# Patient Record
Sex: Female | Born: 1985 | Hispanic: Yes | Marital: Married | Smoking: Never smoker
Health system: Southern US, Community
[De-identification: ages and names within clinical notes are randomized; demographics above are authoritative.]

## PROBLEM LIST (undated history)

## (undated) ENCOUNTER — Inpatient Hospital Stay (HOSPITAL_COMMUNITY): Payer: Self-pay

## (undated) DIAGNOSIS — R51 Headache: Secondary | ICD-10-CM

## (undated) DIAGNOSIS — T7840XA Allergy, unspecified, initial encounter: Secondary | ICD-10-CM

## (undated) DIAGNOSIS — K219 Gastro-esophageal reflux disease without esophagitis: Secondary | ICD-10-CM

## (undated) DIAGNOSIS — G039 Meningitis, unspecified: Secondary | ICD-10-CM

## (undated) DIAGNOSIS — K529 Noninfective gastroenteritis and colitis, unspecified: Secondary | ICD-10-CM

## (undated) DIAGNOSIS — G8929 Other chronic pain: Secondary | ICD-10-CM

## (undated) DIAGNOSIS — R519 Headache, unspecified: Secondary | ICD-10-CM

## (undated) DIAGNOSIS — B999 Unspecified infectious disease: Secondary | ICD-10-CM

## (undated) DIAGNOSIS — F419 Anxiety disorder, unspecified: Secondary | ICD-10-CM

## (undated) DIAGNOSIS — N83209 Unspecified ovarian cyst, unspecified side: Secondary | ICD-10-CM

## (undated) DIAGNOSIS — K5909 Other constipation: Secondary | ICD-10-CM

## (undated) HISTORY — DX: Other constipation: K59.09

## (undated) HISTORY — DX: Gastro-esophageal reflux disease without esophagitis: K21.9

## (undated) HISTORY — PX: COLONOSCOPY: SHX174

## (undated) HISTORY — PX: WISDOM TOOTH EXTRACTION: SHX21

## (undated) HISTORY — DX: Allergy, unspecified, initial encounter: T78.40XA

## (undated) HISTORY — DX: Noninfective gastroenteritis and colitis, unspecified: K52.9

## (undated) SURGERY — Surgical Case
Anesthesia: *Unknown

---
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
## 2006-02-09 ENCOUNTER — Emergency Department (HOSPITAL_COMMUNITY): Admission: EM | Admit: 2006-02-09 | Discharge: 2006-02-09 | Payer: Self-pay | Admitting: Family Medicine

## 2006-06-07 ENCOUNTER — Emergency Department (HOSPITAL_COMMUNITY): Admission: EM | Admit: 2006-06-07 | Discharge: 2006-06-07 | Payer: Self-pay | Admitting: Emergency Medicine

## 2007-04-25 ENCOUNTER — Emergency Department (HOSPITAL_COMMUNITY): Admission: EM | Admit: 2007-04-25 | Discharge: 2007-04-25 | Payer: Self-pay | Admitting: *Deleted

## 2007-11-24 ENCOUNTER — Encounter (INDEPENDENT_AMBULATORY_CARE_PROVIDER_SITE_OTHER): Payer: Self-pay | Admitting: Internal Medicine

## 2007-11-30 ENCOUNTER — Encounter (INDEPENDENT_AMBULATORY_CARE_PROVIDER_SITE_OTHER): Payer: Self-pay | Admitting: Internal Medicine

## 2007-11-30 ENCOUNTER — Ambulatory Visit: Payer: Self-pay | Admitting: Internal Medicine

## 2007-11-30 DIAGNOSIS — R413 Other amnesia: Secondary | ICD-10-CM | POA: Insufficient documentation

## 2007-11-30 DIAGNOSIS — R51 Headache: Secondary | ICD-10-CM

## 2007-11-30 DIAGNOSIS — R519 Headache, unspecified: Secondary | ICD-10-CM | POA: Insufficient documentation

## 2007-11-30 HISTORY — DX: Other amnesia: R41.3

## 2007-11-30 HISTORY — DX: Headache: R51

## 2007-12-04 ENCOUNTER — Ambulatory Visit: Payer: Self-pay | Admitting: Internal Medicine

## 2007-12-04 ENCOUNTER — Encounter (INDEPENDENT_AMBULATORY_CARE_PROVIDER_SITE_OTHER): Payer: Self-pay | Admitting: Internal Medicine

## 2007-12-04 LAB — CONVERTED CEMR LAB
AST: 16 units/L (ref 0–37)
Alkaline Phosphatase: 48 units/L (ref 39–117)
CO2: 28 meq/L (ref 19–32)
Chloride: 108 meq/L (ref 96–112)
Creatinine, Ser: 0.57 mg/dL (ref 0.40–1.20)
HCT: 38 % (ref 36.0–46.0)
Hemoglobin: 12.9 g/dL (ref 12.0–15.0)
INR: 1.1 (ref 0.0–1.5)
Lymphs Abs: 2.8 10*3/uL (ref 0.7–4.0)
Monocytes Absolute: 0.3 10*3/uL (ref 0.1–1.0)
Monocytes Relative: 4 % (ref 3–12)
Neutrophils Relative %: 57 % (ref 43–77)
Potassium: 4.1 meq/L (ref 3.5–5.3)
RBC: 4.44 M/uL (ref 3.87–5.11)
RDW: 12.5 % (ref 11.5–15.5)
Total Bilirubin: 0.6 mg/dL (ref 0.3–1.2)
Total Protein: 6.1 g/dL (ref 6.0–8.3)
Vitamin B-12: 561 pg/mL (ref 211–911)

## 2007-12-18 ENCOUNTER — Ambulatory Visit (HOSPITAL_COMMUNITY): Admission: RE | Admit: 2007-12-18 | Discharge: 2007-12-18 | Payer: Self-pay | Admitting: Internal Medicine

## 2008-03-30 ENCOUNTER — Encounter (INDEPENDENT_AMBULATORY_CARE_PROVIDER_SITE_OTHER): Payer: Self-pay | Admitting: Internal Medicine

## 2008-03-30 ENCOUNTER — Ambulatory Visit: Payer: Self-pay | Admitting: Infectious Diseases

## 2008-08-07 ENCOUNTER — Emergency Department (HOSPITAL_COMMUNITY): Admission: EM | Admit: 2008-08-07 | Discharge: 2008-08-07 | Payer: Self-pay | Admitting: Family Medicine

## 2008-08-28 ENCOUNTER — Emergency Department (HOSPITAL_COMMUNITY): Admission: EM | Admit: 2008-08-28 | Discharge: 2008-08-28 | Payer: Self-pay | Admitting: *Deleted

## 2008-09-07 ENCOUNTER — Emergency Department (HOSPITAL_COMMUNITY): Admission: EM | Admit: 2008-09-07 | Discharge: 2008-09-07 | Payer: Self-pay | Admitting: Emergency Medicine

## 2009-01-29 ENCOUNTER — Emergency Department (HOSPITAL_COMMUNITY): Admission: EM | Admit: 2009-01-29 | Discharge: 2009-01-29 | Payer: Self-pay | Admitting: Emergency Medicine

## 2009-03-23 ENCOUNTER — Emergency Department (HOSPITAL_COMMUNITY): Admission: EM | Admit: 2009-03-23 | Discharge: 2009-03-23 | Payer: Self-pay | Admitting: Emergency Medicine

## 2009-05-04 ENCOUNTER — Emergency Department (HOSPITAL_COMMUNITY): Admission: EM | Admit: 2009-05-04 | Discharge: 2009-05-04 | Payer: Self-pay | Admitting: Family Medicine

## 2009-09-05 ENCOUNTER — Emergency Department (HOSPITAL_COMMUNITY): Admission: EM | Admit: 2009-09-05 | Discharge: 2009-09-05 | Payer: Self-pay | Admitting: Family Medicine

## 2010-02-05 ENCOUNTER — Emergency Department (HOSPITAL_COMMUNITY): Admission: EM | Admit: 2010-02-05 | Discharge: 2010-02-05 | Payer: Self-pay | Admitting: Emergency Medicine

## 2010-03-12 ENCOUNTER — Ambulatory Visit: Payer: Self-pay | Admitting: Nurse Practitioner

## 2010-03-12 ENCOUNTER — Inpatient Hospital Stay (HOSPITAL_COMMUNITY): Admission: AD | Admit: 2010-03-12 | Discharge: 2010-03-12 | Payer: Self-pay | Admitting: Obstetrics and Gynecology

## 2010-03-28 ENCOUNTER — Inpatient Hospital Stay (HOSPITAL_COMMUNITY): Admission: AD | Admit: 2010-03-28 | Discharge: 2010-03-28 | Payer: Self-pay | Admitting: Obstetrics and Gynecology

## 2010-04-11 ENCOUNTER — Ambulatory Visit (HOSPITAL_COMMUNITY): Admission: RE | Admit: 2010-04-11 | Discharge: 2010-04-11 | Payer: Self-pay | Admitting: Family Medicine

## 2010-04-25 ENCOUNTER — Inpatient Hospital Stay (HOSPITAL_COMMUNITY)
Admission: AD | Admit: 2010-04-25 | Discharge: 2010-04-25 | Payer: Self-pay | Source: Home / Self Care | Attending: Obstetrics & Gynecology | Admitting: Obstetrics & Gynecology

## 2010-04-27 ENCOUNTER — Ambulatory Visit (HOSPITAL_COMMUNITY)
Admission: RE | Admit: 2010-04-27 | Discharge: 2010-04-27 | Payer: Self-pay | Source: Home / Self Care | Attending: Obstetrics & Gynecology | Admitting: Obstetrics & Gynecology

## 2010-05-09 ENCOUNTER — Inpatient Hospital Stay (HOSPITAL_COMMUNITY)
Admission: RE | Admit: 2010-05-09 | Discharge: 2010-05-09 | Payer: Self-pay | Source: Home / Self Care | Attending: Family Medicine | Admitting: Family Medicine

## 2010-05-10 ENCOUNTER — Inpatient Hospital Stay (HOSPITAL_COMMUNITY)
Admission: AD | Admit: 2010-05-10 | Discharge: 2010-05-10 | Payer: Self-pay | Source: Home / Self Care | Attending: Family Medicine | Admitting: Family Medicine

## 2010-05-13 NOTE — L&D Delivery Note (Signed)
Delivery Note At 6:49 PM a viable female was delivered via Vaginal, Spontaneous Delivery (Presentation: Vtx, OA;  ).  APGAR: 9, 9; weight 6lb 1oz.   Placenta status: Intact-->Pathology, .  Cord: 3 vessels.  Delivered through loose nuchal cord x 1.  Anesthesia: Epidural  Episiotomy: None Lacerations:  B/L labial tears  Suture Repair: 3.0 vicryl rapide Est. Blood Loss (mL):   Mom to postpartum.  Baby to nursery-stable.  JACKSON-MOORE,Kabrina Christiano A 12/30/2010, 7:02 PM

## 2010-05-15 LAB — HEPATITIS B SURFACE ANTIGEN: Hepatitis B Surface Ag: NEGATIVE

## 2010-06-30 ENCOUNTER — Inpatient Hospital Stay (HOSPITAL_COMMUNITY)
Admission: AD | Admit: 2010-06-30 | Discharge: 2010-06-30 | Disposition: A | Discharge status: Home / Self Care | Payer: Medicaid Other | Source: Ambulatory Visit | Attending: Obstetrics | Admitting: Obstetrics

## 2010-06-30 DIAGNOSIS — R109 Unspecified abdominal pain: Secondary | ICD-10-CM

## 2010-06-30 DIAGNOSIS — O99891 Other specified diseases and conditions complicating pregnancy: Secondary | ICD-10-CM | POA: Insufficient documentation

## 2010-06-30 DIAGNOSIS — O9989 Other specified diseases and conditions complicating pregnancy, childbirth and the puerperium: Secondary | ICD-10-CM

## 2010-06-30 DIAGNOSIS — K219 Gastro-esophageal reflux disease without esophagitis: Secondary | ICD-10-CM | POA: Insufficient documentation

## 2010-07-06 ENCOUNTER — Inpatient Hospital Stay (HOSPITAL_COMMUNITY)
Admission: AD | Admit: 2010-07-06 | Discharge: 2010-07-06 | Disposition: A | Discharge status: Home / Self Care | Payer: Medicaid Other | Source: Ambulatory Visit | Attending: Obstetrics & Gynecology | Admitting: Obstetrics & Gynecology

## 2010-07-06 DIAGNOSIS — R109 Unspecified abdominal pain: Secondary | ICD-10-CM | POA: Insufficient documentation

## 2010-07-06 DIAGNOSIS — K219 Gastro-esophageal reflux disease without esophagitis: Secondary | ICD-10-CM | POA: Insufficient documentation

## 2010-07-06 DIAGNOSIS — O9989 Other specified diseases and conditions complicating pregnancy, childbirth and the puerperium: Secondary | ICD-10-CM

## 2010-07-06 DIAGNOSIS — O99891 Other specified diseases and conditions complicating pregnancy: Secondary | ICD-10-CM | POA: Insufficient documentation

## 2010-07-06 LAB — CBC
Hemoglobin: 11.3 g/dL — ABNORMAL LOW (ref 12.0–15.0)
MCH: 29.2 pg (ref 26.0–34.0)
MCHC: 33.6 g/dL (ref 30.0–36.0)
MCV: 86.8 fL (ref 78.0–100.0)
Platelets: 279 10*3/uL (ref 150–400)
RDW: 12.7 % (ref 11.5–15.5)

## 2010-07-06 LAB — COMPREHENSIVE METABOLIC PANEL
Albumin: 3.4 g/dL — ABNORMAL LOW (ref 3.5–5.2)
BUN: 5 mg/dL — ABNORMAL LOW (ref 6–23)
CO2: 20 mEq/L (ref 19–32)
GFR calc Af Amer: >60 mL/min (ref >60)
GFR calc non Af Amer: >60 mL/min (ref >60)
Potassium: 3.5 mEq/L (ref 3.5–5.1)
Sodium: 133 mEq/L — ABNORMAL LOW (ref 135–145)
Total Bilirubin: 0.4 mg/dL (ref 0.3–1.2)

## 2010-07-06 LAB — LIPASE, BLOOD: Lipase: 33 U/L (ref 11–59)

## 2010-07-09 LAB — H. PYLORI ANTIBODY, IGG: H Pylori IgG: <0.4 {ISR}

## 2010-07-23 LAB — HCG, QUANTITATIVE, PREGNANCY: hCG, Beta Chain, Quant, S: 221 m[IU]/mL — ABNORMAL HIGH (ref <5)

## 2010-07-23 LAB — GC/CHLAMYDIA PROBE AMP, GENITAL
Chlamydia, DNA Probe: NEGATIVE
GC Probe Amp, Genital: NEGATIVE

## 2010-07-23 LAB — WET PREP, GENITAL: Trich, Wet Prep: NONE SEEN

## 2010-07-24 LAB — HERPES SIMPLEX VIRUS CULTURE

## 2010-07-24 LAB — CBC
Hemoglobin: 13.1 g/dL (ref 12.0–15.0)
MCH: 29.7 pg (ref 26.0–34.0)
Platelets: 326 10*3/uL (ref 150–400)
WBC: 9.1 10*3/uL (ref 4.0–10.5)

## 2010-07-24 LAB — GC/CHLAMYDIA PROBE AMP, GENITAL
Chlamydia, DNA Probe: NEGATIVE
GC Probe Amp, Genital: NEGATIVE

## 2010-07-24 LAB — URINALYSIS, ROUTINE W REFLEX MICROSCOPIC
Glucose, UA: NEGATIVE mg/dL
Leukocytes, UA: NEGATIVE
Nitrite: NEGATIVE
Protein, ur: NEGATIVE mg/dL
Urobilinogen, UA: 0.2 mg/dL (ref 0.0–1.0)

## 2010-07-24 LAB — URINE MICROSCOPIC-ADD ON

## 2010-07-24 LAB — POCT PREGNANCY, URINE: Preg Test, Ur: NEGATIVE

## 2010-07-24 LAB — WET PREP, GENITAL: Trich, Wet Prep: NONE SEEN

## 2010-07-24 LAB — HCG, QUANTITATIVE, PREGNANCY: hCG, Beta Chain, Quant, S: 5 m[IU]/mL — ABNORMAL HIGH (ref <5)

## 2010-07-25 LAB — URINALYSIS, ROUTINE W REFLEX MICROSCOPIC
Glucose, UA: NEGATIVE mg/dL
Ketones, ur: NEGATIVE mg/dL
Leukocytes, UA: NEGATIVE
Nitrite: NEGATIVE
Urobilinogen, UA: 0.2 mg/dL (ref 0.0–1.0)

## 2010-07-25 LAB — CBC
HCT: 38.9 % (ref 36.0–46.0)
Hemoglobin: 13.1 g/dL (ref 12.0–15.0)
RBC: 4.38 MIL/uL (ref 3.87–5.11)
WBC: 11.1 10*3/uL — ABNORMAL HIGH (ref 4.0–10.5)

## 2010-07-25 LAB — URINE MICROSCOPIC-ADD ON

## 2010-07-25 LAB — POCT PREGNANCY, URINE: Preg Test, Ur: NEGATIVE

## 2010-08-13 LAB — POCT RAPID STREP A (OFFICE): Streptococcus, Group A Screen (Direct): NEGATIVE

## 2010-08-15 LAB — CBC
HCT: 39.9 % (ref 36.0–46.0)
Hemoglobin: 13.9 g/dL (ref 12.0–15.0)
MCV: 85.8 fL (ref 78.0–100.0)
RBC: 4.65 MIL/uL (ref 3.87–5.11)
WBC: 18.4 10*3/uL — ABNORMAL HIGH (ref 4.0–10.5)

## 2010-08-15 LAB — DIFFERENTIAL
Basophils Absolute: 0.1 10*3/uL (ref 0.0–0.1)
Basophils Relative: 0 % (ref 0–1)
Eosinophils Relative: 1 % (ref 0–5)
Lymphocytes Relative: 11 % — ABNORMAL LOW (ref 12–46)
Monocytes Absolute: 0.6 10*3/uL (ref 0.1–1.0)
Neutro Abs: 15.5 10*3/uL — ABNORMAL HIGH (ref 1.7–7.7)

## 2010-08-15 LAB — COMPREHENSIVE METABOLIC PANEL
Alkaline Phosphatase: 47 U/L (ref 39–117)
BUN: 7 mg/dL (ref 6–23)
CO2: 22 mEq/L (ref 19–32)
Chloride: 110 mEq/L (ref 96–112)
Creatinine, Ser: 0.6 mg/dL (ref 0.4–1.2)
GFR calc non Af Amer: >60 mL/min (ref >60)
Potassium: 3.8 mEq/L (ref 3.5–5.1)
Total Bilirubin: 0.4 mg/dL (ref 0.3–1.2)

## 2010-08-22 LAB — POCT RAPID STREP A (OFFICE): Streptococcus, Group A Screen (Direct): NEGATIVE

## 2010-08-27 ENCOUNTER — Other Ambulatory Visit: Payer: Self-pay | Admitting: Obstetrics & Gynecology

## 2010-08-27 DIAGNOSIS — Z0489 Encounter for examination and observation for other specified reasons: Secondary | ICD-10-CM

## 2010-08-27 DIAGNOSIS — IMO0002 Reserved for concepts with insufficient information to code with codable children: Secondary | ICD-10-CM

## 2010-08-27 DIAGNOSIS — O2690 Pregnancy related conditions, unspecified, unspecified trimester: Secondary | ICD-10-CM

## 2010-08-29 ENCOUNTER — Ambulatory Visit (HOSPITAL_COMMUNITY)
Admission: RE | Admit: 2010-08-29 | Discharge: 2010-08-29 | Disposition: A | Discharge status: Home / Self Care | Payer: Medicaid Other | Source: Ambulatory Visit | Attending: Obstetrics & Gynecology | Admitting: Obstetrics & Gynecology

## 2010-08-29 DIAGNOSIS — O2690 Pregnancy related conditions, unspecified, unspecified trimester: Secondary | ICD-10-CM

## 2010-08-29 DIAGNOSIS — Z0489 Encounter for examination and observation for other specified reasons: Secondary | ICD-10-CM

## 2010-08-29 DIAGNOSIS — Z363 Encounter for antenatal screening for malformations: Secondary | ICD-10-CM | POA: Insufficient documentation

## 2010-08-29 DIAGNOSIS — Z1389 Encounter for screening for other disorder: Secondary | ICD-10-CM | POA: Insufficient documentation

## 2010-08-29 DIAGNOSIS — O358XX0 Maternal care for other (suspected) fetal abnormality and damage, not applicable or unspecified: Secondary | ICD-10-CM | POA: Insufficient documentation

## 2010-09-05 ENCOUNTER — Inpatient Hospital Stay (HOSPITAL_COMMUNITY)
Admission: AD | Admit: 2010-09-05 | Discharge: 2010-09-05 | Disposition: A | Discharge status: Home / Self Care | Payer: Medicaid Other | Source: Ambulatory Visit | Attending: Obstetrics & Gynecology | Admitting: Obstetrics & Gynecology

## 2010-09-05 DIAGNOSIS — O210 Mild hyperemesis gravidarum: Secondary | ICD-10-CM | POA: Insufficient documentation

## 2010-09-05 LAB — COMPREHENSIVE METABOLIC PANEL
BUN: 5 mg/dL — ABNORMAL LOW (ref 6–23)
CO2: 23 mEq/L (ref 19–32)
Calcium: 9.2 mg/dL (ref 8.4–10.5)
Creatinine, Ser: 0.4 mg/dL (ref 0.4–1.2)
GFR calc non Af Amer: >60 mL/min (ref >60)
Glucose, Bld: 87 mg/dL (ref 70–99)
Sodium: 134 mEq/L — ABNORMAL LOW (ref 135–145)
Total Protein: 6 g/dL (ref 6.0–8.3)

## 2010-09-05 LAB — URINALYSIS, ROUTINE W REFLEX MICROSCOPIC
Bilirubin Urine: NEGATIVE
Hgb urine dipstick: NEGATIVE
Nitrite: NEGATIVE
Protein, ur: NEGATIVE mg/dL
Specific Gravity, Urine: 1.02 (ref 1.005–1.030)
Urobilinogen, UA: 0.2 mg/dL (ref 0.0–1.0)

## 2010-09-19 ENCOUNTER — Other Ambulatory Visit: Payer: Self-pay | Admitting: Obstetrics

## 2010-09-19 DIAGNOSIS — O358XX0 Maternal care for other (suspected) fetal abnormality and damage, not applicable or unspecified: Secondary | ICD-10-CM

## 2010-09-24 ENCOUNTER — Inpatient Hospital Stay (HOSPITAL_COMMUNITY)
Admission: AD | Admit: 2010-09-24 | Discharge: 2010-09-24 | Disposition: A | Discharge status: Home / Self Care | Payer: Medicaid Other | Source: Ambulatory Visit | Attending: Obstetrics | Admitting: Obstetrics

## 2010-09-24 DIAGNOSIS — N760 Acute vaginitis: Secondary | ICD-10-CM | POA: Insufficient documentation

## 2010-09-24 DIAGNOSIS — A499 Bacterial infection, unspecified: Secondary | ICD-10-CM | POA: Insufficient documentation

## 2010-09-24 DIAGNOSIS — R109 Unspecified abdominal pain: Secondary | ICD-10-CM

## 2010-09-24 DIAGNOSIS — O2390 Unspecified genitourinary tract infection in pregnancy, unspecified trimester: Secondary | ICD-10-CM | POA: Insufficient documentation

## 2010-09-24 DIAGNOSIS — B9689 Other specified bacterial agents as the cause of diseases classified elsewhere: Secondary | ICD-10-CM | POA: Insufficient documentation

## 2010-09-24 LAB — URINALYSIS, ROUTINE W REFLEX MICROSCOPIC
Glucose, UA: NEGATIVE mg/dL
Ketones, ur: NEGATIVE mg/dL
Nitrite: NEGATIVE
Specific Gravity, Urine: <1.005 — ABNORMAL LOW (ref 1.005–1.030)
pH: 7 (ref 5.0–8.0)

## 2010-09-24 LAB — URINE MICROSCOPIC-ADD ON

## 2010-09-24 LAB — FETAL FIBRONECTIN: Fetal Fibronectin: NEGATIVE

## 2010-09-24 LAB — WET PREP, GENITAL
Trich, Wet Prep: NONE SEEN
Yeast Wet Prep HPF POC: NONE SEEN

## 2010-09-25 LAB — URINE CULTURE
Culture  Setup Time: 201205150212
Culture: NO GROWTH

## 2010-09-28 NOTE — Discharge Summary (Signed)
NAMEJADAE, Lydia [name]                 ACCOUNT NO.:  [id number]   MEDICAL RECORD NO.:  [id number]          PATIENT TYPE:  INP   LOCATION:  3711                         FACILITY:  [facility]   PHYSICIAN:  Fransisco Hertz, M.D.  DATE OF BIRTH:  Jan 30, 1986   DATE OF ADMISSION:  02/28/2005  DATE OF DISCHARGE:  03/02/2005                                 DISCHARGE SUMMARY   DISCHARGE DIAGNOSES:  1.  Aseptic meningitis.  2.  Status post delivery of first child in July 2006.   DISCHARGE MEDICATIONS:  1.  Acyclovir 400 mg one tablet t.i.d. p.o. for five days.  2.  Percocet 5/325 mg one to two tablets p.o. q.4h. p.r.n.   DISPOSITION AT DISCHARGE:  The patient was stable at discharge.  Her  headache had improved.  She did not have nuchal rigidity and had no  complaints.  She was discharged with a five-day course of acyclovir 400 mg  empirically until the HSV PCR results are final.  A CD4 count and  ultraquantitative viral load are still pending and should be followed in the  next two weeks as an outpatient.   PROCEDURES:  She had an MRI and MRA of the head with and without contrast,  which showed subtle increased signal and diffuse leptomeningeal enhancement,  which are compatible with meningitis, as well as no definite venous  occlusive disease.  A lumbar puncture was done on October 19.  The fluid was  colorless, clear.  In the first tube there were 478 red blood cells per cu  mm, 188 white blood cells per cu mm, 1% neutrophils, lymphocytes 95%.  In  tube 4 there were 10 red blood cells per cu mm, 200 white blood cells per cu  mm, with 98% lymphocytes, 2% monocytes.  Glucose was 58 mg/dl, protein 88  mg/dl, and there was no organism seen on Gram stain.   Consultations were done with Bevelyn Buckles. Champey, M.D., neurologist.   ADMITTING HISTORY AND PHYSICAL:  Lydia [name] is an 25 year old woman who was  previously in good health with a past medical history only significant for  recent term delivery  in July 2006 of a healthy baby.  The past two weeks she  reports having bifrontal headache that is more or less continuous with no  clear relieving factors.  The headache was aggravated by everything, per the  patient.  She had associated nausea, no vomiting, no alteration of the level  of consciousness, no vision changes.  She did report one episode of fever  with a temperature of 102 degrees with chills.  She denies weakness of  extremities.  Denies any trauma, and this is the first episode of this kind  of headache.   REVIEW OF SYSTEMS:  Otherwise negative.   PHYSICAL EXAMINATION:  VITAL SIGNS:  She had a temperature of 98.2, blood  pressure of 112/76, pulse of 82, respiratory rate 22.  GENERAL:  She had a good general appearance but was in mild discomfort.  HEENT:  The pupils were equal and reactive to light.  She was photophobic.  ENT  exam was grossly normal.  NECK:  She did not have neck stiffness, but her neck was tender to flexion.  CHEST:  Lungs were clear to auscultation bilaterally.  CARDIAC:  Heart exam was normal.  ABDOMEN:  Normal.  EXTREMITIES:  She did not have lower extremity edema.  SKIN:  Warm and dry.  LYMPHATIC:  She did not have lymphadenopathy.  NEUROLOGIC:  The cranial nerves were normal from II-XII.  She was oriented  x3.  Gait was normal.  Sensory and motor exams were normal and symmetrical  bilaterally.   LABORATORY DATA:  Sodium 136, potassium 3.7, chloride 103, CO2 25, BUN 9,  creatinine 0.6, glucose 123.  White blood count 18.6 with an ANC of 88%,  hemoglobin 14.3, MCV 85, platelets 457.  CT scan of the head done on October  15 showed no intracranial abnormality and chronic sinusitis.  A lumbar  puncture was done and showed glucose of 58 and protein of 88 mg/dl.  Neutrophils of 1-2% and lymphocytes of 95-98%.  No organisms were seen on  Gram stain.   HOSPITAL COURSE:  Lydia [name] was admitted for supportive treatment for what  was likely an  aseptic/viral meningitis with suspicion for HSV meningitis.  She was started empirically on IV acyclovir.  CSF was sent for HSV PCR.  We  also drew some blood for HIV testing.  She stated she had HIV testing during  her pregnancy which was negative, but we sent the blood for CD4 count and  ultraquantitative viral load for HIV.  Given her history of recent delivery,  we had an MRI/MRA to rule out intracranial thrombosis.  The day after  admission we stopped the acyclovir and she had no confusion or evidence of  encephalopathy.  This to be restarted if the CSF is positive for HSV.  The  MRI/MRA turned out to be negative for thrombus.  On October 21 her headache  had improved.  She did not have any nuchal rigidity or confusion.  Vital  signs:  Temperature was 98.5, pulse 88, respiratory rate 20, blood pressure  110/64.  She was saturating at 99% on room air.  She had a white blood count  of 8.8, hemoglobin of 12.9, platelets 907.  Sodium 135, potassium 3.7,  chloride 100, CO2 29, BUN 6, creatinine 0.7, glucose 86, calcium 9.5.  She  was discharged home with empirical therapy with acyclovir 400 mg for five  days until the HSV PCR results were final following Dr. Nash Shearer,  neurologist's, recommendations.   PENDING LABS:  HSV PCR, CD4 count and HIV viral load were still pending.  This should be followed as an outpatient in one to two weeks.      Dellia Beckwith, M.D.    ______________________________  Fransisco Hertz, M.D.    VD/MEDQ  D:  03/12/2005  T:  03/13/2005  Job:  [id number]   cc:   [facility]   [facility]

## 2010-09-28 NOTE — Consult Note (Signed)
NAMEDELAYNEE, ALRED                 ACCOUNT NO.:  [id number]   MEDICAL RECORD NO.:  [id number]          PATIENT TYPE:  WOC   LOCATION:  WOC                          FACILITY:  [facility]   PHYSICIAN:  Bevelyn Buckles. Champey, M.D.DATE OF BIRTH:  1985-10-24   DATE OF CONSULTATION:  DATE OF DISCHARGE:                                   CONSULTATION   REFERRING PHYSICIAN:  Celene Kras, M.D.   REASON FOR VISIT:  Headache.   HISTORY OF PRESENT ILLNESS:  Ms. Burlison is an 25 year old Hispanic female  who delivered her first child in July, with no other medical problems, who  presents with a two-week history of headache.  The patient states the  headache has been constant for the past two weeks.  The location of the  headache is bilateral frontal in location.  It is described as sharp and  pounding in nature, 9/10 in intensity, without any radiation.  The patient  does have associated photophobia, phonophobia and severe nausea and vomiting  with her headache.  She has had a poor appetite, not being able to keep food  down and sleep disturbance secondary to her headache.  She was seen in  [facility] last week/earlier this week where they drew blood from  her left side, and she is complaining of numbness around that area.  The  patient denies any other symptoms of weakness, vision changes, speech  changes, swallowing problems, chewing problems, dizziness, vertigo, falls or  loss of consciousness.   PAST MEDICAL HISTORY:  Significant for only postpartum since July 2006.   CURRENT MEDICATIONS:  Oxycodone, Phenergan and Levaquin.   ALLERGIES:  THE PATIENT HAS DRUG ALLERGY TO PENICILLIN WHICH CAUSES A RASH.   SOCIAL HISTORY:  The patient currently lives with her mom.  Denies any  smoking, alcohol or drug use.  Is not currently breastfeeding.   FAMILY HISTORY:  Positive for hypertension, diabetes and heart disease.   REVIEW OF SYSTEMS:  Positive for headache, photophobia.  Review of  systems  is negative as per history of present illness and greater than nine other  organ systems.   PHYSICAL EXAMINATION:  VITAL SIGNS:  Temperature is 98.2; blood pressure is  112/76; pulse is 82; respirations 22.  HEENT:  Normocephalic, atraumatic.  Extraocular muscles are intact.  Pupils  are equal, round and reactive to light.  NECK:  Slightly rigid and tender.  There are no carotid bruits heard.  HEART:  Regular.  LUNGS:  Clear.  ABDOMEN:  Soft, nontender.  EXTREMITIES:  No edema.  Good pulses.  NEUROLOGICAL EXAMINATION:  The patient is alert and oriented times three.  Language is fluent.  Memory and knowledge are within normal limits.  Cranial  nerves II through XII are grossly intact.  Motor examination shows 5/5  strength and normal tone in all four extremities.  No drift is noted.  Sensory examination is within normal limits to light touch and pinprick.  Reflexes are 1-2+ throughout and symmetric.  Toes are downgoing bilaterally.  Cerebellar function has within normal limits to finger-to-nose and rapid  alternating  movements.  Gait was not assessed secondary to  safety/drowsiness.   LABORATORIES:  WBC is 18.6, hemoglobin 14.3, hematocrit 41.6, platelets 457.  Sodium 136, potassium 3.7, chloride 103, CO2 25, BUN 9, creatinine 0.6,  glucose 123, calcium 9.5.  CSF studies show WBCs 189-205, lymphs 95-98%,  glucose 58, protein 88, opening pressure 41.  CT of the head done on February 24, 2005 showed no acute abnormalities but chronic sinusitis.   IMPRESSION:  This is an 25 year old Hispanic female with headaches and  photophobia with slight nuchal rigidity for the last two weeks.  The  patient's cerebrospinal fluid findings and exam are consistent with viral  meningitis.  Will continue her antibiotics and acyclovir until her cultures  and PCR return.  Will continue her on her current medications for her  headache and nausea.  With the recent pregnancy, I would also check an  MRI  and MRV of the brain to rule out any possible venous thrombosis postpartum.  I would also consider doing a workup for other infectious etiologies as her  WBC count is elevated.  We will follow the patient while she is in the  hospital.           ______________________________  Bevelyn Buckles. Nash Shearer, M.D.     DRC/MEDQ  D:  02/28/2005  T:  02/28/2005  Job:  [id number]

## 2010-10-10 ENCOUNTER — Other Ambulatory Visit: Payer: Self-pay | Admitting: Obstetrics

## 2010-10-10 ENCOUNTER — Ambulatory Visit (HOSPITAL_COMMUNITY)
Admission: RE | Admit: 2010-10-10 | Discharge: 2010-10-10 | Disposition: A | Discharge status: Home / Self Care | Payer: Medicaid Other | Source: Ambulatory Visit | Attending: Obstetrics | Admitting: Obstetrics

## 2010-10-10 DIAGNOSIS — O358XX0 Maternal care for other (suspected) fetal abnormality and damage, not applicable or unspecified: Secondary | ICD-10-CM | POA: Insufficient documentation

## 2010-10-10 DIAGNOSIS — Z3689 Encounter for other specified antenatal screening: Secondary | ICD-10-CM | POA: Insufficient documentation

## 2010-10-21 ENCOUNTER — Inpatient Hospital Stay (HOSPITAL_COMMUNITY)
Admission: AD | Admit: 2010-10-21 | Discharge: 2010-10-21 | Disposition: A | Discharge status: Home / Self Care | Payer: Medicaid Other | Source: Ambulatory Visit | Attending: Obstetrics | Admitting: Obstetrics

## 2010-10-21 DIAGNOSIS — O99891 Other specified diseases and conditions complicating pregnancy: Secondary | ICD-10-CM

## 2010-10-21 DIAGNOSIS — R109 Unspecified abdominal pain: Secondary | ICD-10-CM

## 2010-10-21 DIAGNOSIS — O9989 Other specified diseases and conditions complicating pregnancy, childbirth and the puerperium: Secondary | ICD-10-CM

## 2010-10-21 LAB — URINALYSIS, ROUTINE W REFLEX MICROSCOPIC
Bilirubin Urine: NEGATIVE
Hgb urine dipstick: NEGATIVE
Ketones, ur: NEGATIVE mg/dL
Specific Gravity, Urine: 1.01 (ref 1.005–1.030)
Urobilinogen, UA: 0.2 mg/dL (ref 0.0–1.0)

## 2010-10-22 ENCOUNTER — Inpatient Hospital Stay (HOSPITAL_COMMUNITY)
Admission: AD | Admit: 2010-10-22 | Discharge: 2010-10-22 | Disposition: A | Discharge status: Home / Self Care | Payer: Medicaid Other | Source: Ambulatory Visit | Attending: Obstetrics | Admitting: Obstetrics

## 2010-10-22 DIAGNOSIS — O368190 Decreased fetal movements, unspecified trimester, not applicable or unspecified: Secondary | ICD-10-CM

## 2010-10-31 ENCOUNTER — Other Ambulatory Visit: Payer: Self-pay | Admitting: Obstetrics

## 2010-10-31 ENCOUNTER — Ambulatory Visit (HOSPITAL_COMMUNITY)
Admission: RE | Admit: 2010-10-31 | Discharge: 2010-10-31 | Disposition: A | Discharge status: Home / Self Care | Payer: Medicaid Other | Source: Ambulatory Visit | Attending: Obstetrics | Admitting: Obstetrics

## 2010-10-31 DIAGNOSIS — Z3689 Encounter for other specified antenatal screening: Secondary | ICD-10-CM | POA: Insufficient documentation

## 2010-10-31 DIAGNOSIS — Z09 Encounter for follow-up examination after completed treatment for conditions other than malignant neoplasm: Secondary | ICD-10-CM

## 2010-10-31 DIAGNOSIS — O358XX0 Maternal care for other (suspected) fetal abnormality and damage, not applicable or unspecified: Secondary | ICD-10-CM

## 2010-11-11 ENCOUNTER — Inpatient Hospital Stay (HOSPITAL_COMMUNITY)
Admission: AD | Admit: 2010-11-11 | Discharge: 2010-11-12 | Disposition: A | Discharge status: Home / Self Care | Payer: Medicaid Other | Source: Ambulatory Visit | Attending: Obstetrics | Admitting: Obstetrics

## 2010-11-11 DIAGNOSIS — O9989 Other specified diseases and conditions complicating pregnancy, childbirth and the puerperium: Secondary | ICD-10-CM

## 2010-11-11 DIAGNOSIS — O99891 Other specified diseases and conditions complicating pregnancy: Secondary | ICD-10-CM | POA: Insufficient documentation

## 2010-11-11 DIAGNOSIS — R109 Unspecified abdominal pain: Secondary | ICD-10-CM | POA: Insufficient documentation

## 2010-11-11 LAB — URINALYSIS, ROUTINE W REFLEX MICROSCOPIC
Bilirubin Urine: NEGATIVE
Glucose, UA: NEGATIVE mg/dL
Hgb urine dipstick: NEGATIVE
Protein, ur: NEGATIVE mg/dL
Specific Gravity, Urine: 1.03 (ref 1.005–1.030)

## 2010-11-21 ENCOUNTER — Ambulatory Visit (HOSPITAL_COMMUNITY): Payer: Medicaid Other

## 2010-11-28 ENCOUNTER — Encounter (HOSPITAL_COMMUNITY): Payer: Self-pay

## 2010-11-28 ENCOUNTER — Ambulatory Visit (HOSPITAL_COMMUNITY)
Admission: RE | Admit: 2010-11-28 | Discharge: 2010-11-28 | Disposition: A | Discharge status: Home / Self Care | Payer: Medicaid Other | Source: Ambulatory Visit | Attending: Obstetrics | Admitting: Obstetrics

## 2010-11-28 ENCOUNTER — Other Ambulatory Visit: Payer: Self-pay | Admitting: Obstetrics

## 2010-11-28 VITALS — BP 108/66 | HR 90 | Wt 157.0 lb

## 2010-11-28 DIAGNOSIS — Z09 Encounter for follow-up examination after completed treatment for conditions other than malignant neoplasm: Secondary | ICD-10-CM

## 2010-11-28 DIAGNOSIS — O368190 Decreased fetal movements, unspecified trimester, not applicable or unspecified: Secondary | ICD-10-CM | POA: Insufficient documentation

## 2010-11-28 DIAGNOSIS — O4290 Premature rupture of membranes, unspecified as to length of time between rupture and onset of labor, unspecified weeks of gestation: Secondary | ICD-10-CM | POA: Insufficient documentation

## 2010-12-08 ENCOUNTER — Encounter (HOSPITAL_COMMUNITY): Payer: Self-pay | Admitting: Obstetrics and Gynecology

## 2010-12-08 ENCOUNTER — Inpatient Hospital Stay (HOSPITAL_COMMUNITY)
Admission: AD | Admit: 2010-12-08 | Discharge: 2010-12-08 | Disposition: A | Discharge status: Home / Self Care | Payer: Medicaid Other | Source: Ambulatory Visit | Attending: Obstetrics & Gynecology | Admitting: Obstetrics & Gynecology

## 2010-12-08 DIAGNOSIS — R350 Frequency of micturition: Secondary | ICD-10-CM | POA: Insufficient documentation

## 2010-12-08 DIAGNOSIS — N390 Urinary tract infection, site not specified: Secondary | ICD-10-CM | POA: Insufficient documentation

## 2010-12-08 DIAGNOSIS — O2390 Unspecified genitourinary tract infection in pregnancy, unspecified trimester: Secondary | ICD-10-CM | POA: Insufficient documentation

## 2010-12-08 LAB — URINE MICROSCOPIC-ADD ON

## 2010-12-08 LAB — URINALYSIS, ROUTINE W REFLEX MICROSCOPIC
Glucose, UA: NEGATIVE mg/dL
Leukocytes, UA: NEGATIVE
Nitrite: POSITIVE — AB
Protein, ur: NEGATIVE mg/dL
pH: 7 (ref 5.0–8.0)

## 2010-12-08 MED ORDER — NITROFURANTOIN MACROCRYSTAL 100 MG PO CAPS: 100.0000 mg | ORAL_CAPSULE | Freq: Two times a day (BID) | ORAL | Status: AC

## 2010-12-08 NOTE — ED Provider Notes (Signed)
Lydia [name] is a 25 y.o. female at 36.[redacted] weeks gestation presenting for frequency of urination. She reports +FM and denies contractions, vaginal bleeding, leaking of fluid, fever chills or flank pain.  History OB History    Grav Para Term Preterm Abortions TAB SAB Ect Mult Living   3 1 1  0 1 0 1 0 0 1     History reviewed. No pertinent past medical history. History reviewed. No pertinent past surgical history. Family History: family history is not on file. Social History:  reports that she has never smoked. She does not have any smokeless tobacco history on file. She reports that she does not drink alcohol or use illicit drugs.  ROS Otherwise neg   Blood pressure 110/63, pulse 85, temperature 98.9 F (37.2 C), temperature source Oral, resp. rate 20, height 4\' 11"  (1.499 m), weight 71.895 kg (158 lb 8 oz), last menstrual period 03/30/2010. Exam Physical Exam  Results for orders placed during the hospital encounter of 12/08/10 (from the past 24 hour(s))  URINALYSIS, ROUTINE W REFLEX MICROSCOPIC     Status: Abnormal   Collection Time   12/08/10  8:10 PM      Component Value Range   Color, Urine YELLOW  YELLOW    Appearance CLEAR  CLEAR    Specific Gravity, Urine 1.010  1.005 - 1.030    pH 7.0  5.0 - 8.0    Glucose, UA NEGATIVE  NEGATIVE (mg/dL)   Hgb urine dipstick NEGATIVE  NEGATIVE    Bilirubin Urine NEGATIVE  NEGATIVE    Ketones, ur NEGATIVE  NEGATIVE (mg/dL)   Protein, ur NEGATIVE  NEGATIVE (mg/dL)   Urobilinogen, UA 0.2  0.0 - 1.0 (mg/dL)   Nitrite POSITIVE (*) NEGATIVE    Leukocytes, UA NEGATIVE  NEGATIVE   URINE MICROSCOPIC-ADD ON     Status: Abnormal   Collection Time   12/08/10  8:10 PM      Component Value Range   Squamous Epithelial / LPF FEW (*) RARE    WBC, UA 0-2  <3 (WBC/hpf)   RBC / HPF 0-2  <3 (RBC/hpf)   Bacteria, UA MANY (*) RARE    Abd: NT, no CVAT EFM: 130.s category I Toco: Irreg, mild UC's  Prenatal labs: ABO, Rh: O POS (12/14 1825) Antibody:     Rubella:   RPR:    HBsAg:    HIV:    GBS:     Assessment/Plan: Assessment: 1. UTI w/out evidence of pyelo 2. FHR category I  Plan: 1. D/C home per consult w/ Dr. Tamela Oddi 2. Rx Macrobid PO BID x 7 days 3. Urine Culture 4. Increase fluids, cranberry juice   Marquail Bradwell 12/08/2010, 9:11 PM

## 2010-12-08 NOTE — Progress Notes (Signed)
Pt states, " Since Wed I've had to go to the bathroom every few min. It feels like I have to go right back. It has gotten worse and I have a "pullin " sensation in my low abd after I pee."

## 2010-12-11 ENCOUNTER — Telehealth (HOSPITAL_COMMUNITY): Payer: Self-pay | Admitting: *Deleted

## 2010-12-13 ENCOUNTER — Inpatient Hospital Stay (HOSPITAL_COMMUNITY): Payer: Medicaid Other

## 2010-12-13 ENCOUNTER — Encounter (HOSPITAL_COMMUNITY): Payer: Self-pay | Admitting: *Deleted

## 2010-12-13 ENCOUNTER — Inpatient Hospital Stay (HOSPITAL_COMMUNITY)
Admission: AD | Admit: 2010-12-13 | Discharge: 2010-12-13 | Disposition: A | Discharge status: Home / Self Care | Payer: Medicaid Other | Source: Ambulatory Visit | Attending: Obstetrics | Admitting: Obstetrics

## 2010-12-13 DIAGNOSIS — O368190 Decreased fetal movements, unspecified trimester, not applicable or unspecified: Secondary | ICD-10-CM | POA: Insufficient documentation

## 2010-12-13 NOTE — Progress Notes (Signed)
Pt felt normal fetal movement yesterday, baby has only moved once today.  Pt denies pain or bleeding.  ?SROM, found wet spot on her sheets this a.m.

## 2010-12-13 NOTE — ED Provider Notes (Signed)
S  Lydia [name] is a 25 y.o. G3P1011 at [redacted]w[redacted]d reporting no FM since last night. Denies abd pain, contractions except Braxton-Hicks, vag bleeding. Denies pregnancy complications. Does perceive FM since arrival here.   O Filed Vitals:   12/13/10 1530  BP: 103/61  Pulse: 101  Temp: 98.4 F (36.9 C)  Resp: 18  Gen: NAD  Abd: soft, NT, term-size gravid  Cx: L/C/H  EFM: 130, moderate variability, reactive  BPP:6/8 (-2 FBM)   A Fetal wellbeing by EFM and [place].  P  Kick counts

## 2010-12-13 NOTE — Progress Notes (Signed)
Pt states she had not felt the baby move since last night.

## 2010-12-26 ENCOUNTER — Ambulatory Visit (HOSPITAL_COMMUNITY)
Admission: RE | Admit: 2010-12-26 | Discharge: 2010-12-26 | Disposition: A | Discharge status: Home / Self Care | Payer: Medicaid Other | Source: Ambulatory Visit | Attending: Obstetrics | Admitting: Obstetrics

## 2010-12-26 ENCOUNTER — Telehealth (HOSPITAL_COMMUNITY): Payer: Self-pay | Admitting: *Deleted

## 2010-12-26 ENCOUNTER — Encounter (HOSPITAL_COMMUNITY): Payer: Self-pay | Admitting: *Deleted

## 2010-12-26 VITALS — BP 107/69 | HR 90 | Wt 161.0 lb

## 2010-12-26 DIAGNOSIS — Z09 Encounter for follow-up examination after completed treatment for conditions other than malignant neoplasm: Secondary | ICD-10-CM

## 2010-12-26 DIAGNOSIS — Z3689 Encounter for other specified antenatal screening: Secondary | ICD-10-CM | POA: Insufficient documentation

## 2010-12-26 DIAGNOSIS — O4290 Premature rupture of membranes, unspecified as to length of time between rupture and onset of labor, unspecified weeks of gestation: Secondary | ICD-10-CM | POA: Insufficient documentation

## 2010-12-26 NOTE — Telephone Encounter (Signed)
Induction preadmission screen

## 2010-12-26 NOTE — Telephone Encounter (Signed)
Message left preadmission screening

## 2010-12-26 NOTE — Progress Notes (Signed)
Vital signs reviewed; see ultrasound report 

## 2010-12-29 ENCOUNTER — Inpatient Hospital Stay (HOSPITAL_COMMUNITY)
Admission: RE | Admit: 2010-12-29 | Discharge: 2011-01-01 | DRG: 775 | Disposition: A | Discharge status: Home / Self Care | Payer: Medicaid Other | Source: Ambulatory Visit | Attending: Obstetrics & Gynecology | Admitting: Obstetrics & Gynecology

## 2010-12-29 ENCOUNTER — Encounter (HOSPITAL_COMMUNITY): Payer: Self-pay

## 2010-12-29 DIAGNOSIS — O358XX0 Maternal care for other (suspected) fetal abnormality and damage, not applicable or unspecified: Secondary | ICD-10-CM | POA: Diagnosis present

## 2010-12-29 DIAGNOSIS — R51 Headache: Secondary | ICD-10-CM

## 2010-12-29 DIAGNOSIS — O359XX0 Maternal care for (suspected) fetal abnormality and damage, unspecified, not applicable or unspecified: Secondary | ICD-10-CM | POA: Diagnosis present

## 2010-12-29 DIAGNOSIS — R413 Other amnesia: Secondary | ICD-10-CM

## 2010-12-29 LAB — CBC
HCT: 36.9 % (ref 36.0–46.0)
MCH: 31.1 pg (ref 26.0–34.0)
MCV: 90.4 fL (ref 78.0–100.0)
RBC: 4.08 MIL/uL (ref 3.87–5.11)
WBC: 13.6 10*3/uL — ABNORMAL HIGH (ref 4.0–10.5)

## 2010-12-29 MED ORDER — OXYTOCIN 20 UNITS IN LACTATED RINGERS INFUSION - SIMPLE
125.0000 mL/h | INTRAVENOUS | Status: DC
  Filled 2010-12-29: qty 1000

## 2010-12-29 MED ORDER — OXYCODONE-ACETAMINOPHEN 5-325 MG PO TABS: 2.0000 | ORAL_TABLET | ORAL | Status: DC | PRN

## 2010-12-29 MED ORDER — LACTATED RINGERS IV SOLN
INTRAVENOUS | Status: DC
  Administered 2010-12-29: 125 mL/h via INTRAVENOUS
  Administered 2010-12-30: 300 mL via INTRAVENOUS
  Administered 2010-12-30: 13:00:00 via INTRAVENOUS
  Administered 2010-12-30 (×2): 300 mL via INTRAVENOUS
  Administered 2010-12-30: 125 mL via INTRAVENOUS

## 2010-12-29 MED ORDER — TERBUTALINE SULFATE 1 MG/ML IJ SOLN: 0.2500 mg | Freq: Once | INTRAMUSCULAR | Status: AC | PRN

## 2010-12-29 MED ORDER — LACTATED RINGERS IV SOLN
500.0000 mL | INTRAVENOUS | Status: DC | PRN
  Administered 2010-12-30: 500 mL via INTRAVENOUS

## 2010-12-29 MED ORDER — CITRIC ACID-SODIUM CITRATE 334-500 MG/5ML PO SOLN: 30.0000 mL | ORAL | Status: DC | PRN

## 2010-12-29 MED ORDER — OXYTOCIN BOLUS FROM INFUSION
500.0000 mL | Freq: Once | INTRAVENOUS | Status: AC
  Administered 2010-12-30: 500 mL via INTRAVENOUS
  Filled 2010-12-29: qty 500

## 2010-12-29 MED ORDER — SODIUM CHLORIDE 0.9 % IJ SOLN: 3.0000 mL | Freq: Two times a day (BID) | INTRAMUSCULAR | Status: DC

## 2010-12-29 MED ORDER — IBUPROFEN 600 MG PO TABS: 600.0000 mg | ORAL_TABLET | Freq: Four times a day (QID) | ORAL | Status: DC | PRN

## 2010-12-29 MED ORDER — SODIUM CHLORIDE 0.9 % IV SOLN: 250.0000 mL | INTRAVENOUS | Status: DC

## 2010-12-29 MED ORDER — DINOPROSTONE 10 MG VA INST
10.0000 mg | VAGINAL_INSERT | Freq: Once | VAGINAL | Status: AC
  Administered 2010-12-29: 10 mg via VAGINAL
  Filled 2010-12-29: qty 1

## 2010-12-29 MED ORDER — SODIUM CHLORIDE 0.9 % IJ SOLN: 3.0000 mL | INTRAMUSCULAR | Status: DC | PRN

## 2010-12-30 ENCOUNTER — Encounter (HOSPITAL_COMMUNITY): Payer: Self-pay | Admitting: Anesthesiology

## 2010-12-30 ENCOUNTER — Inpatient Hospital Stay (HOSPITAL_COMMUNITY): Payer: Medicaid Other | Admitting: Anesthesiology

## 2010-12-30 ENCOUNTER — Encounter (HOSPITAL_COMMUNITY): Payer: Self-pay

## 2010-12-30 ENCOUNTER — Inpatient Hospital Stay (HOSPITAL_COMMUNITY): Admission: RE | Admit: 2010-12-30 | Payer: Medicaid Other | Source: Ambulatory Visit

## 2010-12-30 ENCOUNTER — Other Ambulatory Visit: Payer: Self-pay | Admitting: Obstetrics & Gynecology

## 2010-12-30 DIAGNOSIS — O359XX0 Maternal care for (suspected) fetal abnormality and damage, unspecified, not applicable or unspecified: Secondary | ICD-10-CM | POA: Diagnosis present

## 2010-12-30 MED ORDER — FERROUS SULFATE 325 (65 FE) MG PO TABS
325.0000 mg | ORAL_TABLET | Freq: Two times a day (BID) | ORAL | Status: DC
  Administered 2010-12-31 – 2011-01-01 (×3): 325 mg via ORAL
  Filled 2010-12-30 (×3): qty 1

## 2010-12-30 MED ORDER — TETANUS-DIPHTH-ACELL PERTUSSIS 5-2.5-18.5 LF-MCG/0.5 IM SUSP: 0.5000 mL | Freq: Once | INTRAMUSCULAR | Status: DC

## 2010-12-30 MED ORDER — ONDANSETRON HCL 4 MG/2ML IJ SOLN: 4.0000 mg | INTRAMUSCULAR | Status: DC | PRN

## 2010-12-30 MED ORDER — EPHEDRINE 5 MG/ML INJ
10.0000 mg | INTRAVENOUS | Status: DC | PRN
  Filled 2010-12-30: qty 4

## 2010-12-30 MED ORDER — EPHEDRINE 5 MG/ML INJ
10.0000 mg | INTRAVENOUS | Status: DC | PRN
  Filled 2010-12-30 (×2): qty 4

## 2010-12-30 MED ORDER — PHENYLEPHRINE 40 MCG/ML (10ML) SYRINGE FOR IV PUSH (FOR BLOOD PRESSURE SUPPORT)
80.0000 ug | PREFILLED_SYRINGE | INTRAVENOUS | Status: DC | PRN
  Filled 2010-12-30 (×2): qty 5

## 2010-12-30 MED ORDER — OXYCODONE-ACETAMINOPHEN 5-325 MG PO TABS: 1.0000 | ORAL_TABLET | ORAL | Status: DC | PRN

## 2010-12-30 MED ORDER — PHENYLEPHRINE 40 MCG/ML (10ML) SYRINGE FOR IV PUSH (FOR BLOOD PRESSURE SUPPORT)
80.0000 ug | PREFILLED_SYRINGE | INTRAVENOUS | Status: DC | PRN
  Filled 2010-12-30: qty 5

## 2010-12-30 MED ORDER — LIDOCAINE HCL (PF) 1 % IJ SOLN
INTRAMUSCULAR | Status: AC
  Administered 2010-12-30: 30 mL via SUBCUTANEOUS
  Filled 2010-12-30: qty 30

## 2010-12-30 MED ORDER — BENZOCAINE-MENTHOL 20-0.5 % EX AERO
INHALATION_SPRAY | CUTANEOUS | Status: AC
  Administered 2010-12-30: 1 via TOPICAL
  Filled 2010-12-30: qty 56

## 2010-12-30 MED ORDER — DIPHENHYDRAMINE HCL 25 MG PO CAPS
25.0000 mg | ORAL_CAPSULE | Freq: Four times a day (QID) | ORAL | Status: DC | PRN
Start: 2010-12-30 — End: 2011-01-01

## 2010-12-30 MED ORDER — LACTATED RINGERS IV SOLN: 500.0000 mL | Freq: Once | INTRAVENOUS | Status: DC

## 2010-12-30 MED ORDER — BUTORPHANOL TARTRATE 2 MG/ML IJ SOLN
1.0000 mg | INTRAMUSCULAR | Status: DC | PRN
  Administered 2010-12-30 (×2): 1 mg via INTRAVENOUS
  Filled 2010-12-30 (×2): qty 1

## 2010-12-30 MED ORDER — ONDANSETRON HCL 4 MG PO TABS: 4.0000 mg | ORAL_TABLET | ORAL | Status: DC | PRN

## 2010-12-30 MED ORDER — WITCH HAZEL-GLYCERIN EX PADS
1.0000 "application " | MEDICATED_PAD | CUTANEOUS | Status: DC | PRN
  Administered 2010-12-30: 1 via TOPICAL

## 2010-12-30 MED ORDER — IBUPROFEN 600 MG PO TABS
600.0000 mg | ORAL_TABLET | Freq: Four times a day (QID) | ORAL | Status: DC
  Administered 2010-12-30 – 2011-01-01 (×6): 600 mg via ORAL
  Filled 2010-12-30 (×6): qty 1

## 2010-12-30 MED ORDER — MEDROXYPROGESTERONE ACETATE 150 MG/ML IM SUSP: 150.0000 mg | INTRAMUSCULAR | Status: DC | PRN

## 2010-12-30 MED ORDER — LIDOCAINE HCL 1.5 % IJ SOLN
INTRAMUSCULAR | Status: DC | PRN
  Administered 2010-12-30 (×2): 5 mL via EPIDURAL

## 2010-12-30 MED ORDER — SODIUM CHLORIDE 0.9 % IJ SOLN: 3.0000 mL | INTRAMUSCULAR | Status: DC | PRN

## 2010-12-30 MED ORDER — LACTATED RINGERS IV SOLN
INTRAVENOUS | Status: DC
  Administered 2010-12-30: 18:00:00 via INTRAUTERINE
  Administered 2010-12-30: 999 mL/h via INTRAUTERINE

## 2010-12-30 MED ORDER — BENZOCAINE-MENTHOL 20-0.5 % EX AERO
1.0000 "application " | INHALATION_SPRAY | CUTANEOUS | Status: DC | PRN
  Administered 2010-12-30: 1 via TOPICAL

## 2010-12-30 MED ORDER — MAGNESIUM HYDROXIDE 400 MG/5ML PO SUSP: 30.0000 mL | ORAL | Status: DC | PRN

## 2010-12-30 MED ORDER — MEASLES, MUMPS & RUBELLA VAC ~~LOC~~ INJ
0.5000 mL | INJECTION | Freq: Once | SUBCUTANEOUS | Status: DC
  Filled 2010-12-30: qty 0.5

## 2010-12-30 MED ORDER — LANOLIN HYDROUS EX OINT: TOPICAL_OINTMENT | CUTANEOUS | Status: DC | PRN

## 2010-12-30 MED ORDER — SENNOSIDES-DOCUSATE SODIUM 8.6-50 MG PO TABS
2.0000 | ORAL_TABLET | Freq: Every day | ORAL | Status: DC
  Administered 2010-12-31: 2 via ORAL

## 2010-12-30 MED ORDER — ZOLPIDEM TARTRATE 5 MG PO TABS: 5.0000 mg | ORAL_TABLET | Freq: Every evening | ORAL | Status: DC | PRN

## 2010-12-30 MED ORDER — DIBUCAINE 1 % RE OINT
1.0000 "application " | TOPICAL_OINTMENT | RECTAL | Status: DC | PRN
  Administered 2010-12-30: 1 via RECTAL
  Filled 2010-12-30: qty 28

## 2010-12-30 MED ORDER — PRENATAL PLUS 27-1 MG PO TABS
1.0000 | ORAL_TABLET | Freq: Every day | ORAL | Status: DC
  Administered 2010-12-31 – 2011-01-01 (×2): 1 via ORAL
  Filled 2010-12-30 (×2): qty 1

## 2010-12-30 MED ORDER — SODIUM CHLORIDE 0.9 % IV SOLN: 250.0000 mL | INTRAVENOUS | Status: DC

## 2010-12-30 MED ORDER — SODIUM CHLORIDE 0.9 % IJ SOLN: 3.0000 mL | Freq: Two times a day (BID) | INTRAMUSCULAR | Status: DC

## 2010-12-30 MED ORDER — OXYTOCIN 20 UNITS IN LACTATED RINGERS INFUSION - SIMPLE: 125.0000 mL/h | INTRAVENOUS | Status: DC | PRN

## 2010-12-30 MED ORDER — FENTANYL 2.5 MCG/ML BUPIVACAINE 1/10 % EPIDURAL INFUSION (WH - ANES)
INTRAMUSCULAR | Status: DC | PRN
  Administered 2010-12-30: 14 mL/h via EPIDURAL

## 2010-12-30 MED ORDER — DIPHENHYDRAMINE HCL 50 MG/ML IJ SOLN: 12.5000 mg | INTRAMUSCULAR | Status: DC | PRN

## 2010-12-30 MED ORDER — FENTANYL 2.5 MCG/ML BUPIVACAINE 1/10 % EPIDURAL INFUSION (WH - ANES)
14.0000 mL/h | INTRAMUSCULAR | Status: DC
  Administered 2010-12-30: 14 mL/h via EPIDURAL
  Filled 2010-12-30 (×2): qty 60

## 2010-12-30 NOTE — Progress Notes (Signed)
Lydia [name] is a 25 y.o. G3P1011 at [redacted]w[redacted]d by LMP admitted for induction of labor due to Elective at term.  Subjective:   Objective: BP 102/64  Pulse 84  Temp(Src) 97.9 F (36.6 C) (Oral)  Resp 18  Ht 4\' 8"  (1.422 m)  Wt 73.029 kg (161 lb)  BMI 36.10 kg/m2  LMP 03/30/2010 I/O last 3 completed shifts: In: 550 [I.V.:550] Out: -     FHT:  FHR: 140 bpm, variability: moderate,  accelerations:  Present,  decelerations:  Present variable, moderate UC:   regular, every 4 minutes SVE:   Dilation: 3 Effacement (%): 60 Station: -2 Exam by:: jackson-moore (IUPC/FSE)  Labs: Lab Results  Component Value Date   WBC 13.6* 12/29/2010   HGB 12.7 12/29/2010   HCT 36.9 12/29/2010   MCV 90.4 12/29/2010   PLT 246 12/29/2010    Assessment / Plan: IOL, elective; fetal anomalies  Labor: early  Fetal Wellbeing:  Category II and FHT c/w fetal well-being Pain Control:  IV pain meds I/D:  n/a Anticipated MOD:  NSVD  JACKSON-MOORE,Dinita Migliaccio A 12/30/2010, 12:52 PM

## 2010-12-30 NOTE — Anesthesia Postprocedure Evaluation (Signed)
Anesthesia Post Note  Patient: Lydia [name]  Procedure(s) Performed: * No procedures listed *  Anesthesia type: Epidural  Patient location: Mother/Baby  Post pain: Pain level controlled  Post assessment: Post-op Vital signs reviewed  Last Vitals:  Filed Vitals:   12/30/10 1947  BP: 118/58  Pulse: 91  Temp:   Resp: 18    Post vital signs: Reviewed  Level of consciousness: awake  Complications: No apparent anesthesia complications

## 2010-12-30 NOTE — H&P (Signed)
Lydia [name] is [name] 25 y.o. female presenting for IOL. Maternal Medical History:  Reason for admission: IOL  Fetal activity: Perceived fetal activity is normal.    Prenatal complications: Fetal anomalies--duplicated renal collecting system; UE limb reduction    OB History    Grav Para Term Preterm Abortions TAB SAB Ect Mult Living   3 1 1  0 1 0 1 0 0 1     History reviewed. No pertinent past medical history. History reviewed. No pertinent past surgical history. Family History: family history includes Hypertension in her mother. Social History:  reports that she has never smoked. She does not have any smokeless tobacco history on file. She reports that she does not drink alcohol or use illicit drugs.  Review of Systems  Constitutional: Negative for fever.  Eyes: Negative for blurred vision.  Respiratory: Negative for shortness of breath.   Gastrointestinal: Negative for vomiting.  Skin: Negative for rash.  Neurological: Negative for headaches.    Dilation: 1 (Inserted Cervidil) Effacement (%): 40 Station: -2 Exam by:: GPayne, RN Blood pressure 112/69, pulse 87, temperature 98.2 F (36.8 C), temperature source Oral, resp. rate 20, height 4\' 8"  (1.422 m), weight 73.029 kg (161 lb), last menstrual period 03/30/2010. Maternal Exam:  Abdomen: Patient reports no abdominal tenderness. Fetal presentation: vertex  Introitus: not evaluated.     Fetal Exam Fetal Monitor Review: Variability: moderate (6-25 bpm).   Pattern: accelerations present and no decelerations.    Fetal State Assessment: Category I - tracings are normal.     Physical Exam  Constitutional: She appears well-developed.  HENT:  Head: Normocephalic.  Neck: Neck supple. No thyromegaly present.  Cardiovascular: Normal rate and regular rhythm.   Respiratory: Breath sounds normal.  GI: Soft. Bowel sounds are normal.  Skin: No rash noted.    Prenatal labs: ABO, Rh: --/--/O POS (12/14 1825) Antibody:     Rubella:   RPR:    HBsAg:    HIV:    GBS:     Assessment/Plan: 25 y.o. P1 w/an IUP @ [redacted]w[redacted]d for IOL.  Multiple fetal anomalies.  Admit Two-stage IOL  JACKSON-MOORE,Lydia [name] 12/30/2010, 12:23 AM

## 2010-12-30 NOTE — Anesthesia Preprocedure Evaluation (Signed)
Anesthesia Evaluation  Name, MR# and DOB Patient awake  General Assessment Comment  Reviewed: Allergy & Precautions, H&P , Patient's Chart, lab work & pertinent test results  Airway Mallampati: II TM Distance: >3 FB Neck ROM: full    Dental No notable dental hx. (+) Teeth Intact   Pulmonary  clear to auscultation  pulmonary exam normalPulmonary Exam Normal breath sounds clear to auscultation none    Cardiovascular     Neuro/Psych Negative Psych ROS  GI/Hepatic/Renal negative GI ROS, negative Liver ROS, and negative Renal ROS (+)       Endo/Other  Negative Endocrine ROS (+)   Morbid obesity  Abdominal   Musculoskeletal negative musculoskeletal ROS (+)   Hematology negative hematology ROS (+)   Peds  Reproductive/Obstetrics (+) Pregnancy    Anesthesia Other Findings             Anesthesia Physical Anesthesia Plan  ASA: III  Anesthesia Plan: Epidural   Post-op Pain Management:    Induction:   Airway Management Planned:   Additional Equipment:   Intra-op Plan:   Post-operative Plan:   Informed Consent: I have reviewed the patients History and Physical, chart, labs and discussed the procedure including the risks, benefits and alternatives for the proposed anesthesia with the patient or authorized representative who has indicated his/her understanding and acceptance.     Plan Discussed with:   Anesthesia Plan Comments:         Anesthesia Quick Evaluation

## 2010-12-30 NOTE — Anesthesia Procedure Notes (Signed)
Epidural Patient location during procedure: OB Start time: 12/30/2010 1:44 PM End time: 12/30/2010 1:53 PM Reason for block: procedure for pain  Staffing Anesthesiologist: Sandrea Hughs  Preanesthetic Checklist Completed: patient identified, site marked, surgical consent, pre-op evaluation, timeout performed, IV checked, risks and benefits discussed and monitors and equipment checked  Epidural Patient position: sitting Prep: site prepped and draped and DuraPrep Patient monitoring: continuous pulse ox and blood pressure Approach: midline Injection technique: LOR air  Needle:  Needle type: Tuohy  Needle gauge: 17 G Needle length: 9 cm Needle insertion depth: 6 cm Catheter type: closed end flexible Catheter size: 19 Gauge Catheter at skin depth: 10 cm Test dose: negative and 1.5% lidocaine  Assessment Sensory level: T7 Events: blood not aspirated, injection not painful, no injection resistance, negative IV test and no paresthesia

## 2010-12-31 LAB — CBC
Hemoglobin: 11.2 g/dL — ABNORMAL LOW (ref 12.0–15.0)
MCH: 30.1 pg (ref 26.0–34.0)
MCHC: 33.1 g/dL (ref 30.0–36.0)
MCV: 90.9 fL (ref 78.0–100.0)

## 2010-12-31 NOTE — Progress Notes (Signed)
UR chart review completed.  

## 2010-12-31 NOTE — Anesthesia Postprocedure Evaluation (Signed)
  Anesthesia Post-op Note  Patient: Lydia [name]  Procedure(s) Performed: * No procedures listed *  Patient Location: Mother/Baby  Anesthesia Type: Epidural  Level of Consciousness: awake, alert  and oriented  Airway and Oxygen Therapy: Patient Spontanous Breathing  Post-op Pain: none  Post-op Assessment: Post-op Vital signs reviewed, Patient's Cardiovascular Status Stable, No headache, No backache, No residual numbness and No residual motor weakness  Post-op Vital Signs: Reviewed and stable  Complications: No apparent anesthesia complications

## 2010-12-31 NOTE — Addendum Note (Signed)
Addendum  created 12/31/10 0935 by Madison Hickman   Modules edited:Charges VN, Notes Section

## 2010-12-31 NOTE — Progress Notes (Signed)
  Post Partum Day 1 S/P spontaneous vaginal RH status/Rubella reviewed.  Feeding: bottle, breast Subjective: No HA, SOB, CP, F/C, breast symptoms. Normal vaginal bleeding, no clots.     Objective: BP 101/68  Pulse 77  Temp(Src) 98.1 F (36.7 C) (Oral)  Resp 18  Ht 4\' 8"  (1.422 m)  Wt 73.029 kg (161 lb)  BMI 36.10 kg/m2  SpO2 98%  LMP 03/30/2010  Breastfeeding? Unknown   Physical Exam:  General: alert Lochia: appropriate Uterine Fundus: firm DVT Evaluation: No evidence of DVT seen on physical exam. Ext: No c/c/e  Basename 12/31/10 0525 12/29/10 2156  HGB 11.2* 12.7  HCT 33.8* 36.9      Assessment/Plan: 25 y.o.  PPD #1 .  normal postpartum exam Continue current postpartum care  Ambulate   LOS: 2 days   JACKSON-MOORE,Toi Stelly A 12/31/2010, 7:46 AM

## 2011-01-01 MED ORDER — IBUPROFEN 600 MG PO TABS: 600.0000 mg | ORAL_TABLET | Freq: Four times a day (QID) | ORAL | Status: DC

## 2011-01-01 MED ORDER — TETANUS-DIPHTH-ACELL PERTUSSIS 5-2.5-18.5 LF-MCG/0.5 IM SUSP: 0.5000 mL | Freq: Once | INTRAMUSCULAR | Status: DC

## 2011-01-01 NOTE — Progress Notes (Signed)
Post Partum Day 2 Subjective: no complaints  Objective: Blood pressure 104/72, pulse 80, temperature 98 F (36.7 C), temperature source Oral, resp. rate 18, height 4\' 8"  (1.422 m), weight 73.029 kg (161 lb), last menstrual period 03/30/2010, SpO2 98.00%, unknown if currently breastfeeding.  Physical Exam:  General: alert and no distress Lochia: appropriate Uterine Fundus: firm Incision: n/a DVT Evaluation: No evidence of DVT seen on physical exam.   Basename 12/31/10 0525 12/29/10 2156  HGB 11.2* 12.7  HCT 33.8* 36.9    Assessment/Plan: Discharge home   LOS: 3 days   Lydia [name] A 01/01/2011, 9:31 AM

## 2011-01-01 NOTE — Discharge Summary (Signed)
Obstetric Discharge Summary Reason for Admission: induction of labor Prenatal Procedures: ultrasound Intrapartum Procedures: spontaneous vaginal delivery Postpartum Procedures: none Complications-Operative and Postpartum: none Hemoglobin  Date Value Range Status  12/31/2010 11.2* 12.0-15.0 (g/dL) Final     HCT  Date Value Range Status  12/31/2010 33.8* 36.0-46.0 (%) Final    Discharge Diagnoses: Term Pregnancy-delivered  Discharge Information: Date: 01/01/2011 Activity: pelvic rest Diet: routine Medications: PNV and Ibuprophen Condition: stable Instructions: refer to practice specific booklet Discharge to: home Follow-up Information    Follow up with Lydia Runyan A, MD. Make an appointment in 6 weeks.   Contact information:   [address] [place] [address] [phone]          Newborn Data: Live born female  Birth Weight: 6 lb 1.4 oz (2761 g) APGAR: 9, 9  Home with mother.  Lydia [name] 01/01/2011, 9:35 AM

## 2011-01-14 NOTE — Progress Notes (Signed)
See OB ultrasound report in [facility] 

## 2011-06-10 ENCOUNTER — Encounter (HOSPITAL_COMMUNITY): Payer: Self-pay

## 2011-06-10 ENCOUNTER — Emergency Department (INDEPENDENT_AMBULATORY_CARE_PROVIDER_SITE_OTHER)
Admission: EM | Admit: 2011-06-10 | Discharge: 2011-06-10 | Disposition: A | Discharge status: Home / Self Care | Payer: Medicaid Other | Source: Home / Self Care | Attending: Family Medicine | Admitting: Family Medicine

## 2011-06-10 DIAGNOSIS — J069 Acute upper respiratory infection, unspecified: Secondary | ICD-10-CM

## 2011-06-10 MED ORDER — IPRATROPIUM BROMIDE 0.06 % NA SOLN: 2.0000 | Freq: Four times a day (QID) | NASAL | Status: DC

## 2011-06-10 MED ORDER — ONDANSETRON HCL 4 MG PO TABS: 4.0000 mg | ORAL_TABLET | Freq: Four times a day (QID) | ORAL | Status: AC

## 2011-06-10 NOTE — ED Provider Notes (Signed)
History     CSN: [id number]  Arrival date & time 06/10/11  1522   First MD Initiated Contact with Patient 06/10/11 1543      Chief Complaint  Patient presents with  . Sinusitis  . Headache  . Nausea    (Consider location/radiation/quality/duration/timing/severity/associated sxs/prior treatment) Patient is a 26 y.o. female presenting with URI. The history is provided by the patient.  URI The primary symptoms include headaches, nausea and myalgias. Primary symptoms do not include cough or rash. The current episode started more than 1 week ago. This is a new problem. The problem has been gradually improving.  The headache is not associated with photophobia or eye pain.  Symptoms associated with the illness include sinus pressure. The illness is not associated with congestion or rhinorrhea.    History reviewed. No pertinent past medical history.  History reviewed. No pertinent past surgical history.  Family History  Problem Relation Age of Onset  . Hypertension Mother     History  Substance Use Topics  . Smoking status: Never Smoker   . Smokeless tobacco: Not on file  . Alcohol Use: No     past h/o alcohol use    OB History    Grav Para Term Preterm Abortions TAB SAB Ect Mult Living   3 2 2  0 1 0 1 0 0 2      Review of Systems  Constitutional: Negative.   HENT: Positive for sinus pressure. Negative for congestion and rhinorrhea.   Eyes: Negative for photophobia and pain.  Respiratory: Negative for cough.   Cardiovascular: Negative.   Gastrointestinal: Positive for nausea.  Musculoskeletal: Positive for myalgias.  Skin: Negative for rash.  Neurological: Positive for headaches. Negative for light-headedness.  Hematological: Negative.   Psychiatric/Behavioral: Negative.     Allergies  Penicillins  Home Medications   Current Outpatient Rx  Name Route Sig Dispense Refill  . ACETAMINOPHEN 325 MG PO TABS Oral Take 325 mg by mouth daily as needed. For cramping      . IBUPROFEN 600 MG PO TABS Oral Take 1 tablet (600 mg total) by mouth every 6 (six) hours. 30 tablet 5  . IPRATROPIUM BROMIDE 0.06 % NA SOLN Nasal Place 2 sprays into the nose 4 (four) times daily. 15 mL 1  . ONDANSETRON HCL 4 MG PO TABS Oral Take 1 tablet (4 mg total) by mouth every 6 (six) hours. As needed for n/v 8 tablet 0  . PRENATAL VITAMINS (DIS) PO TABS Oral Take 1 tablet by mouth daily.     Alice Reichert PERTUSSIS 5-2.5-18.5 LF-MCG/0.5 IM SUSP Intramuscular Inject 0.5 mLs into the muscle once. 0.5 mL 0    Dispense as written.    BP 112/70  Pulse 71  Temp(Src) 99 F (37.2 C) (Oral)  Resp 14  SpO2 100%  LMP 06/08/2011  Physical Exam  Nursing note and vitals reviewed. Constitutional: She is oriented to person, place, and time. She appears well-developed and well-nourished.  HENT:  Head: Normocephalic.  Right Ear: External ear normal.  Left Ear: External ear normal.  Mouth/Throat: Oropharynx is clear and moist.  Eyes: Conjunctivae and EOM are normal. Pupils are equal, round, and reactive to light.  Neck: Normal range of motion. Neck supple.  Cardiovascular: Normal rate, normal heart sounds and intact distal pulses.   Pulmonary/Chest: Effort normal and breath sounds normal.  Abdominal: Soft. Bowel sounds are normal. There is no tenderness.  Lymphadenopathy:    She has no cervical adenopathy.  Neurological: She is  alert and oriented to person, place, and time.  Skin: Skin is warm and dry.  Psychiatric: She has a normal mood and affect. Her behavior is normal.    ED Course  Procedures (including critical care time)  Labs Reviewed - No data to display No results found.   1. URI (upper respiratory infection)       MDM         Barkley Bruns, MD 06/10/11 1626

## 2011-06-10 NOTE — ED Notes (Signed)
C/o headache, nausea, bodyaches and sinus pressure for one week.  Denies fever

## 2011-08-26 ENCOUNTER — Inpatient Hospital Stay (HOSPITAL_COMMUNITY)
Admission: AD | Admit: 2011-08-26 | Discharge: 2011-08-26 | Disposition: A | Discharge status: Home / Self Care | Payer: Medicaid Other | Source: Ambulatory Visit | Attending: Obstetrics | Admitting: Obstetrics

## 2011-08-26 ENCOUNTER — Encounter (HOSPITAL_COMMUNITY): Payer: Self-pay | Admitting: *Deleted

## 2011-08-26 ENCOUNTER — Inpatient Hospital Stay (HOSPITAL_COMMUNITY): Payer: Medicaid Other

## 2011-08-26 DIAGNOSIS — N83209 Unspecified ovarian cyst, unspecified side: Secondary | ICD-10-CM

## 2011-08-26 DIAGNOSIS — R109 Unspecified abdominal pain: Secondary | ICD-10-CM | POA: Insufficient documentation

## 2011-08-26 LAB — URINALYSIS, ROUTINE W REFLEX MICROSCOPIC
Bilirubin Urine: NEGATIVE
Glucose, UA: NEGATIVE mg/dL
Hgb urine dipstick: NEGATIVE
Ketones, ur: NEGATIVE mg/dL
Leukocytes, UA: NEGATIVE
Protein, ur: NEGATIVE mg/dL

## 2011-08-26 MED ORDER — KETOROLAC TROMETHAMINE 60 MG/2ML IM SOLN
60.0000 mg | Freq: Once | INTRAMUSCULAR | Status: AC
  Administered 2011-08-26: 60 mg via INTRAMUSCULAR
  Filled 2011-08-26: qty 2

## 2011-08-26 NOTE — MAU Provider Note (Signed)
History     CSN: [id number]  Arrival date & time 08/26/11  0955   None     Chief Complaint  Patient presents with  . Abdominal Cramping    HPI Lydia [name] is a 26 y.o. female who presents to MAU for cramping. IUD in place for 2 months. Last month had cramping but this month is worse. [facility]UD placed by provider at [facility].   No past medical history on file.  No past surgical history on file.  Family History  Problem Relation Age of Onset  . Hypertension Mother     History  Substance Use Topics  . Smoking status: Never Smoker   . Smokeless tobacco: Not on file  . Alcohol Use: No     past h/o alcohol use    OB History    Grav Para Term Preterm Abortions TAB SAB Ect Mult Living   3 2 2  0 1 0 1 0 0 2      Review of Systems  Constitutional: Negative for fever, chills, diaphoresis and fatigue.  HENT: Negative for ear pain, congestion, sore throat, facial swelling, neck pain, neck stiffness, dental problem and sinus pressure.   Eyes: Negative for photophobia, pain and discharge.  Respiratory: Negative for cough, chest tightness and wheezing.   Gastrointestinal: Negative for nausea, vomiting, abdominal pain (cramping), diarrhea, constipation and abdominal distention.  Genitourinary: Negative for dysuria, frequency, flank pain and difficulty urinating.  Musculoskeletal: Negative for myalgias, back pain and gait problem.  Skin: Negative for color change and rash.  Neurological: Negative for dizziness, speech difficulty, weakness, light-headedness, numbness and headaches.  Psychiatric/Behavioral: Negative for confusion and agitation.    Allergies  Penicillins  Home Medications  No current outpatient prescriptions on file.  BP 111/68  Pulse 70  Temp(Src) 97 F (36.1 C) (Oral)  Resp 16  Ht 4\' 11"  (1.499 m)  Wt 139 lb 6.4 oz (63.231 kg)  BMI 28.16 kg/m2  SpO2 100%  LMP 08/12/2011  Breastfeeding? Unknown  Physical Exam  Nursing note and vitals  reviewed. Constitutional: She is oriented to person, place, and time. She appears well-developed and well-nourished.  HENT:  Head: Normocephalic.  Eyes: EOM are normal.  Neck: Neck supple.  Cardiovascular: Normal rate.   Pulmonary/Chest: Effort normal.  Abdominal: Soft. There is no tenderness.  Genitourinary:       External genitalia without lesions. White discharge vaginal vault. IUD string visible. No CMT, no adnexal tenderness, uterus without palpable enlargement.  Musculoskeletal: Normal range of motion.  Neurological: She is alert and oriented to person, place, and time. No cranial nerve deficit.  Skin: Skin is warm and dry.  Psychiatric: She has a normal mood and affect. Her behavior is normal. Judgment and thought content normal.    Results for orders placed during the hospital encounter of 08/26/11 (from the past 24 hour(s))  URINALYSIS, ROUTINE W REFLEX MICROSCOPIC     Status: Normal   Collection Time   08/26/11 10:25 AM      Component Value Range   Color, Urine YELLOW  YELLOW    APPearance CLEAR  CLEAR    Specific Gravity, Urine 1.010  1.005 - 1.030    pH 6.0  5.0 - 8.0    Glucose, UA NEGATIVE  NEGATIVE (mg/dL)   Hgb urine dipstick NEGATIVE  NEGATIVE    Bilirubin Urine NEGATIVE  NEGATIVE    Ketones, ur NEGATIVE  NEGATIVE (mg/dL)   Protein, ur NEGATIVE  NEGATIVE (mg/dL)   Urobilinogen, UA 1.0  0.0 - 1.0 (mg/dL)   Nitrite NEGATIVE  NEGATIVE    Leukocytes, UA NEGATIVE  NEGATIVE   POCT PREGNANCY, URINE     Status: Normal   Collection Time   08/26/11 10:35 AM      Component Value Range   Preg Test, Ur NEGATIVE  NEGATIVE    Assessment: Abdominal pain/cramping  Patient left prior to completion of evaluation.   ED Course: ultrasound shows a 5 cm simple cyst left ovary. IUP in appropriate place.   Procedures    MDM

## 2011-08-26 NOTE — MAU Note (Signed)
Pt called & informed of her U/S results, states she is coming back to pick up her discharge paperwork after she picks up her daughter from school.

## 2011-08-26 NOTE — Discharge Instructions (Signed)
FOLLOW UP IN THE OFFICE IF YOU PAIN CONTINUES. RETURN HERE AS NEEDED.  Abdominal Pain Abdominal pain can be caused by many things. Your caregiver decides the seriousness of your pain by an examination and possibly blood tests and X-rays. Many cases can be observed and treated at home. Most abdominal pain is not caused by a disease and will probably improve without treatment. However, in many cases, more time must pass before a clear cause of the pain can be found. Before that point, it may not be known if you need more testing, or if hospitalization or surgery is needed. HOME CARE INSTRUCTIONS   Do not take laxatives unless directed by your caregiver.   Take pain medicine only as directed by your caregiver.   Only take over-the-counter or prescription medicines for pain, discomfort, or fever as directed by your caregiver.   Try a clear liquid diet (broth, tea, or water) for as long as directed by your caregiver. Slowly move to a bland diet as tolerated.  SEEK IMMEDIATE MEDICAL CARE IF:   The pain does not go away.   You have a fever.   You keep throwing up (vomiting).   The pain is felt only in portions of the abdomen. Pain in the right side could possibly be appendicitis. In an adult, pain in the left lower portion of the abdomen could be colitis or diverticulitis.   You pass bloody or black tarry stools.  MAKE SURE YOU:   Understand these instructions.   Will watch your condition.   Will get help right away if you are not doing well or get worse.  Document Released: 02/06/2005 Document Revised: 04/18/2011 Document Reviewed: 12/16/2007 [facility] Patient Information 2012 [place], [place].

## 2011-08-26 NOTE — MAU Note (Signed)
Pt informed staff at desk that she was leaving.

## 2011-08-26 NOTE — MAU Note (Signed)
Patient states she had a Mirena IUD placed by [facility] 2 months ago. Started cramping 3 days ago. Ibuprofen is not helping. Has a slight white/yellow vaginal discharge. Had some milder cramping the first month she had the IUD.

## 2012-08-14 ENCOUNTER — Emergency Department (HOSPITAL_COMMUNITY)
Admission: EM | Admit: 2012-08-14 | Discharge: 2012-08-14 | Disposition: A | Discharge status: Home / Self Care | Payer: Medicaid Other | Source: Home / Self Care | Attending: Family Medicine | Admitting: Family Medicine

## 2012-08-14 ENCOUNTER — Encounter (HOSPITAL_COMMUNITY): Payer: Self-pay | Admitting: Emergency Medicine

## 2012-08-14 DIAGNOSIS — K2970 Gastritis, unspecified, without bleeding: Secondary | ICD-10-CM

## 2012-08-14 LAB — POCT URINALYSIS DIP (DEVICE)
Bilirubin Urine: NEGATIVE
Hgb urine dipstick: NEGATIVE
Ketones, ur: NEGATIVE mg/dL
Protein, ur: NEGATIVE mg/dL
Specific Gravity, Urine: 1.015 (ref 1.005–1.030)
pH: 7 (ref 5.0–8.0)

## 2012-08-14 MED ORDER — TRAMADOL HCL 50 MG PO TABS: 50.0000 mg | ORAL_TABLET | Freq: Four times a day (QID) | ORAL | Status: DC | PRN

## 2012-08-14 MED ORDER — SUCRALFATE 1 GM/10ML PO SUSP: 1.0000 g | Freq: Three times a day (TID) | ORAL | Status: DC

## 2012-08-14 MED ORDER — FAMOTIDINE 20 MG PO TABS: 20.0000 mg | ORAL_TABLET | Freq: Two times a day (BID) | ORAL | Status: DC

## 2012-08-14 MED ORDER — OMEPRAZOLE 20 MG PO CPDR: 20.0000 mg | DELAYED_RELEASE_CAPSULE | Freq: Every day | ORAL | Status: DC

## 2012-08-14 NOTE — ED Notes (Signed)
Pt c/o abdominal pain X 3 days. Denies diarrhea, vomitting, or fever. Has regular bowel movements and urination.  Feels naseous at night time. Has tried Alkaselters and Imodium with no relief. Pt reports last menstrual period was regular but in February it was only one day then stopped. Patient is alert and oriented.

## 2012-08-14 NOTE — ED Provider Notes (Signed)
History     CSN: [id number]  Arrival date & time 08/14/12  1024   First MD Initiated Contact with Patient 08/14/12 1033      Chief Complaint  Patient presents with  . Abdominal Pain    (Consider location/radiation/quality/duration/timing/severity/associated sxs/prior treatment) HPI Comments: 27 y/o non smoker female here c/o intermittent epigastric pain, triggered by certain foods and ocasional alcohol intake. Last episode started 3 days ago after eating a spicy meal. Has had mild nausea but no diarrhea. Reports acid reflux intermittently. Denies vomiting or diarrhea. No melena. Last BM was yesterday soft normal stools. Had menstrual period at end of Feb. For 1 day. U preg negative today. Denies dysuria or hematuria.    History reviewed. No pertinent past medical history.  History reviewed. No pertinent past surgical history.  Family History  Problem Relation Age of Onset  . Hypertension Mother     History  Substance Use Topics  . Smoking status: Never Smoker   . Smokeless tobacco: Not on file  . Alcohol Use: Yes     Comment: past h/o alcohol use: socially/occasioinally    OB History   Grav Para Term Preterm Abortions TAB SAB Ect Mult Living   3 2 2  0 1 0 1 0 0 2      Review of Systems  Constitutional: Negative for fever, chills and appetite change.  Gastrointestinal: Positive for nausea and abdominal pain. Negative for vomiting and diarrhea.  Genitourinary: Negative for dysuria, hematuria and flank pain.  Musculoskeletal: Negative for myalgias and arthralgias.  Neurological: Negative for dizziness and headaches.  All other systems reviewed and are negative.    Allergies  Penicillins  Home Medications   Current Outpatient Rx  Name  Route  Sig  Dispense  Refill  . famotidine (PEPCID) 20 MG tablet   Oral   Take 1 tablet (20 mg total) by mouth 2 (two) times daily.   30 tablet   0   . ibuprofen (ADVIL,MOTRIN) 600 MG tablet   Oral   Take 600 mg by mouth  every 6 (six) hours as needed. For pain         . omeprazole (PRILOSEC) 20 MG capsule   Oral   Take 1 capsule (20 mg total) by mouth daily.   30 capsule   0   . sucralfate (CARAFATE) 1 GM/10ML suspension   Oral   Take 10 mLs (1 g total) by mouth 4 (four) times daily -  with meals and at bedtime. Take 30 min prior meals as needed   420 mL   0   . traMADol (ULTRAM) 50 MG tablet   Oral   Take 1 tablet (50 mg total) by mouth every 6 (six) hours as needed for pain.   15 tablet   0     BP 119/94  Pulse 81  Temp(Src) 98.1 F (36.7 C) (Oral)  Resp 16  SpO2 98%  LMP 08/07/2012  Breastfeeding? No  Physical Exam  Nursing note and vitals reviewed. Constitutional: She is oriented to person, place, and time. She appears well-developed. No distress.  HENT:  Head: Normocephalic and atraumatic.  Mouth/Throat: Oropharynx is clear and moist. No oropharyngeal exudate.  Eyes: Conjunctivae are normal.  Neck: No thyromegaly present.  Cardiovascular: Normal rate, regular rhythm and normal heart sounds.   Pulmonary/Chest: Effort normal and breath sounds normal. No respiratory distress. She has no wheezes. She has no rales. She exhibits no tenderness.  Abdominal:  Epigastric tenderness to deep palpation. No distension.  No rebound. Negative Murphy's sign.  POC non diagnotic portable bed site ultrasound done by Dr. Ladon Applebaum showed a clear gall bladder with no stones, thikening or other inflammatory changes.  Lymphadenopathy:    She has no cervical adenopathy.  Neurological: She is alert and oriented to person, place, and time.  Skin: No rash noted. She is not diaphoretic.    ED Course  Procedures (including critical care time)  Labs Reviewed  POCT URINALYSIS DIP (DEVICE)  POCT PREGNANCY, URINE   No results found.   1. Gastritis       MDM  Normal POC urine and negative urine pregnancy test.  Declined H. pylori screening. Treated with sucralfate, pepsid omeprazole and tramadol.  Asked to recheck pregnancy test in 2 weeks if still amenorrhea.  Supportive care and red flags that should prompt her return to medical attention.         Sharin Grave, MD 08/16/12 1024

## 2012-10-07 ENCOUNTER — Encounter: Payer: Self-pay | Admitting: Obstetrics

## 2012-10-07 ENCOUNTER — Ambulatory Visit (INDEPENDENT_AMBULATORY_CARE_PROVIDER_SITE_OTHER): Payer: Medicaid Other | Admitting: Obstetrics

## 2012-10-07 VITALS — BP 101/69 | HR 74 | Temp 98.3°F | Ht 60.0 in | Wt 133.0 lb

## 2012-10-07 DIAGNOSIS — Z01419 Encounter for gynecological examination (general) (routine) without abnormal findings: Secondary | ICD-10-CM

## 2012-10-07 DIAGNOSIS — Z0000 Encounter for general adult medical examination without abnormal findings: Secondary | ICD-10-CM

## 2012-10-07 DIAGNOSIS — N8300 Follicular cyst of ovary, unspecified side: Secondary | ICD-10-CM | POA: Insufficient documentation

## 2012-10-07 DIAGNOSIS — N949 Unspecified condition associated with female genital organs and menstrual cycle: Secondary | ICD-10-CM | POA: Insufficient documentation

## 2012-10-07 DIAGNOSIS — N83209 Unspecified ovarian cyst, unspecified side: Secondary | ICD-10-CM | POA: Insufficient documentation

## 2012-10-07 NOTE — Progress Notes (Signed)
Subjective:     Lydia [name] is a 27 y.o. female here for a routine exam.  Current complaints: pain with intercourse. Pt states she has been having cramping before during and after intercourse. Pt states she has been experiencing this pain for about 1 month. Pt states she has tried Motrin for discomfort and helps for a while and then pain comes back.  Personal health questionnaire reviewed: yes.   Gynecologic History Patient's last menstrual period was 09/16/2012. Contraception: OCP (estrogen/progesterone) Last Pap: 2013. Results were: normal Last mammogram: n/a. Results were: n/a  Obstetric History OB History   Grav Para Term Preterm Abortions TAB SAB Ect Mult Living   3 2 2  0 1 0 1 0 0 2     # Outc Date GA Lbr Len/2nd Wgt Sex Del Anes PTL Lv   1 TRM 7/06 [redacted]w[redacted]d  6lb14oz(3.118kg) F SVD EPI  Yes   2 TRM 8/12 [redacted]w[redacted]d 10:50 / 00:29 6lb1.4oz(2.761kg) F SVD EPI  Yes   Comments: left arm below elbow missing   3 SAB                The following portions of the patient's history were reviewed and updated as appropriate: allergies, current medications, past family history, past medical history, past social history, past surgical history and problem list.  Review of Systems Pertinent items are noted in HPI.    Objective:    General appearance: alert and no distress Abdomen: normal findings: soft, non-tender Pelvic: cervix normal in appearance, external genitalia normal, no cervical motion tenderness, uterus normal size, shape, and consistency, vagina normal without discharge and left adnexal tenderness.    Assessment:    Healthy female exam.    Left adnexal pain on exam.  Vaginal and lower abdominal pain with intercourse.   Plan:    Education reviewed: Ovarian cysts.    Ultrasound ordered.  F/U 2 weeks.

## 2012-10-08 LAB — WET PREP BY MOLECULAR PROBE
Gardnerella vaginalis: NEGATIVE
Trichomonas vaginosis: NEGATIVE

## 2012-10-09 ENCOUNTER — Encounter: Payer: Self-pay | Admitting: Obstetrics

## 2012-10-14 ENCOUNTER — Ambulatory Visit (HOSPITAL_COMMUNITY)
Admission: RE | Admit: 2012-10-14 | Discharge: 2012-10-14 | Disposition: A | Discharge status: Home / Self Care | Payer: Medicaid Other | Source: Ambulatory Visit | Attending: Obstetrics | Admitting: Obstetrics

## 2012-10-14 DIAGNOSIS — IMO0002 Reserved for concepts with insufficient information to code with codable children: Secondary | ICD-10-CM | POA: Insufficient documentation

## 2012-10-14 DIAGNOSIS — R1032 Left lower quadrant pain: Secondary | ICD-10-CM | POA: Insufficient documentation

## 2012-10-14 DIAGNOSIS — N949 Unspecified condition associated with female genital organs and menstrual cycle: Secondary | ICD-10-CM

## 2012-10-14 DIAGNOSIS — N83209 Unspecified ovarian cyst, unspecified side: Secondary | ICD-10-CM

## 2012-10-21 ENCOUNTER — Encounter: Payer: Self-pay | Admitting: Obstetrics

## 2012-10-21 ENCOUNTER — Ambulatory Visit (INDEPENDENT_AMBULATORY_CARE_PROVIDER_SITE_OTHER): Payer: Medicaid Other | Admitting: Obstetrics

## 2012-10-21 VITALS — BP 102/68 | HR 61 | Temp 97.7°F | Wt 130.4 lb

## 2012-10-21 DIAGNOSIS — N946 Dysmenorrhea, unspecified: Secondary | ICD-10-CM | POA: Insufficient documentation

## 2012-10-21 DIAGNOSIS — N921 Excessive and frequent menstruation with irregular cycle: Secondary | ICD-10-CM | POA: Insufficient documentation

## 2012-10-21 DIAGNOSIS — Z113 Encounter for screening for infections with a predominantly sexual mode of transmission: Secondary | ICD-10-CM

## 2012-10-21 DIAGNOSIS — Z0000 Encounter for general adult medical examination without abnormal findings: Secondary | ICD-10-CM

## 2012-10-21 DIAGNOSIS — Z3041 Encounter for surveillance of contraceptive pills: Secondary | ICD-10-CM

## 2012-10-21 MED ORDER — HYDROCODONE-IBUPROFEN 7.5-200 MG PO TABS: 1.0000 | ORAL_TABLET | Freq: Four times a day (QID) | ORAL | Status: DC | PRN

## 2012-10-21 MED ORDER — NORETHINDRONE-ETH ESTRADIOL 1-35 MG-MCG PO TABS: 1.0000 | ORAL_TABLET | Freq: Every day | ORAL | Status: DC

## 2012-10-21 NOTE — Addendum Note (Signed)
Addended by: Glendell Docker on: 10/21/2012 12:17 PM   Modules accepted: Orders

## 2012-10-21 NOTE — Patient Instructions (Signed)
Pain management 

## 2012-10-21 NOTE — Progress Notes (Signed)
Subjective:     Lydia [name] is a 27 y.o. female here for ultrasound results.  Also request an annual exam and pap smear.  Current complaints: unchanged lower abdominal pain.     Gynecologic History Patient's last menstrual period was 10/17/2012. Contraception: OCP (estrogen/progesterone)   Obstetric History OB History   Grav Para Term Preterm Abortions TAB SAB Ect Mult Living   3 2 2  0 1 0 1 0 0 2     # Outc Date GA Lbr Len/2nd Wgt Sex Del Anes PTL Lv   1 TRM 7/06 [redacted]w[redacted]d  6lb14oz(3.118kg) F SVD EPI  Yes   2 TRM 8/12 [redacted]w[redacted]d 10:50 / 00:29 6lb1.4oz(2.761kg) F SVD EPI  Yes   Comments: left arm below elbow missing   3 SAB                The following portions of the patient's history were reviewed and updated as appropriate: allergies, current medications, past family history, past medical history, past social history, past surgical history and problem list.  Review of Systems Pertinent items are noted in HPI.    Objective:    General appearance: alert and no distress Breasts: normal appearance, no masses or tenderness Abdomen: normal findings: soft, non-tender Pelvic: cervix normal in appearance, external genitalia normal, no adnexal masses or tenderness, no cervical motion tenderness, uterus normal size, shape, and consistency and vagina normal without discharge    Assessment:    Healthy female exam.   Dysmenorrhea.   Plan:    Education reviewed: safe sex/STD prevention and management of dysmenorrhea. Contraception: OCP (estrogen/progesterone). Follow up in: 1 year. Vicoprofen Rx

## 2012-10-22 LAB — WET PREP BY MOLECULAR PROBE
Candida species: NEGATIVE
Gardnerella vaginalis: NEGATIVE
Trichomonas vaginosis: NEGATIVE

## 2012-10-22 LAB — PAP IG W/ RFLX HPV ASCU

## 2012-10-30 ENCOUNTER — Encounter (HOSPITAL_COMMUNITY): Payer: Self-pay | Admitting: Emergency Medicine

## 2012-10-30 ENCOUNTER — Emergency Department (HOSPITAL_COMMUNITY)
Admission: EM | Admit: 2012-10-30 | Discharge: 2012-10-30 | Disposition: A | Discharge status: Home / Self Care | Payer: Medicaid Other | Source: Home / Self Care | Attending: Emergency Medicine | Admitting: Emergency Medicine

## 2012-10-30 DIAGNOSIS — B085 Enteroviral vesicular pharyngitis: Secondary | ICD-10-CM

## 2012-10-30 MED ORDER — BETAMETHASONE 0.6 MG/5ML PO SOLN: 1.2000 mg | Freq: Two times a day (BID) | ORAL | Status: DC

## 2012-10-30 NOTE — ED Provider Notes (Signed)
History     CSN: [id number]  Arrival date & time 10/30/12  1542   First MD Initiated Contact with Patient 10/30/12 1629      Chief Complaint  Patient presents with  . Sore Throat    (Consider location/radiation/quality/duration/timing/severity/associated sxs/prior treatment) Patient is a 27 y.o. female presenting with pharyngitis. The history is provided by the patient.  Sore Throat This is a new problem. Episode onset: 4 days ago. The problem occurs constantly. The problem has not changed since onset.The symptoms are aggravated by swallowing and eating. Nothing relieves the symptoms. She has tried nothing for the symptoms.    History reviewed. No pertinent past medical history.  Past Surgical History  Procedure Laterality Date  . No past surgeries      Family History  Problem Relation Age of Onset  . Hypertension Mother     History  Substance Use Topics  . Smoking status: Never Smoker   . Smokeless tobacco: Never Used  . Alcohol Use: Yes     Comment: past h/o alcohol use: socially/occasioinally    OB History   Grav Para Term Preterm Abortions TAB SAB Ect Mult Living   3 2 2  0 1 0 1 0 0 2      Review of Systems  Constitutional: Negative for fever and chills.  HENT: Negative for ear pain, congestion, rhinorrhea and postnasal drip.   Respiratory: Negative for cough.     Allergies  Penicillins  Home Medications   Current Outpatient Rx  Name  Route  Sig  Dispense  Refill  . norethindrone-ethinyl estradiol 1/35 (NORTREL 1/35, 21,) tablet   Oral   Take 1 tablet by mouth daily.   1 Package   11   . HYDROcodone-ibuprofen (VICOPROFEN) 7.5-200 MG per tablet   Oral   Take 1 tablet by mouth every 6 (six) hours as needed for pain.   40 tablet   2   . ibuprofen (ADVIL,MOTRIN) 600 MG tablet   Oral   Take 600 mg by mouth every 6 (six) hours as needed. For pain         . traMADol (ULTRAM) 50 MG tablet   Oral   Take 1 tablet (50 mg total) by mouth every 6  (six) hours as needed for pain.   15 tablet   0     BP 104/71  Pulse 70  Temp(Src) 98.2 F (36.8 C) (Oral)  Resp 16  SpO2 99%  LMP 10/17/2012  Breastfeeding? No  Physical Exam  Constitutional: She appears well-developed and well-nourished. No distress.  HENT:  Mouth/Throat: Oral lesions present. No oropharyngeal exudate, posterior oropharyngeal edema, posterior oropharyngeal erythema or tonsillar abscesses.  Ulcerated area L soft palate, c/w aphthous ulcer    ED Course  Procedures (including critical care time)  Labs Reviewed  POCT RAPID STREP A ([facility] CARE ONLY)   No results found.   1. Herpangina       MDM          Cathlyn Parsons, NP 10/30/12 1640

## 2012-10-30 NOTE — ED Notes (Signed)
Pt c/o sore throat onset Monday... sxs include: odynophagia... Denies: fevers, cold sxs... Taking OTC cough drops w/no relief... She is alert and oriented w/no signs of acute distress.

## 2012-10-30 NOTE — ED Provider Notes (Signed)
Medical screening examination/treatment/procedure(s) were performed by non-physician practitioner and as supervising physician I was immediately available for consultation/collaboration.  Maysen Sudol, M.D.  Arielis Leonhart C Bryla Burek, MD 10/30/12 2104 

## 2012-11-01 LAB — CULTURE, GROUP A STREP

## 2012-11-15 ENCOUNTER — Other Ambulatory Visit: Payer: Self-pay | Admitting: Obstetrics

## 2013-03-07 ENCOUNTER — Encounter (HOSPITAL_COMMUNITY): Payer: Self-pay | Admitting: Emergency Medicine

## 2013-03-07 ENCOUNTER — Emergency Department (HOSPITAL_COMMUNITY)
Admission: EM | Admit: 2013-03-07 | Discharge: 2013-03-07 | Disposition: A | Discharge status: Home / Self Care | Payer: Medicaid Other | Source: Home / Self Care

## 2013-03-07 DIAGNOSIS — R51 Headache: Secondary | ICD-10-CM

## 2013-03-07 HISTORY — DX: Other chronic pain: G89.29

## 2013-03-07 HISTORY — DX: Meningitis, unspecified: G03.9

## 2013-03-07 HISTORY — DX: Headache, unspecified: R51.9

## 2013-03-07 HISTORY — DX: Headache: R51

## 2013-03-07 MED ORDER — IBUPROFEN 800 MG PO TABS: 800.0000 mg | ORAL_TABLET | Freq: Three times a day (TID) | ORAL | Status: DC

## 2013-03-07 MED ORDER — IPRATROPIUM BROMIDE 0.03 % NA SOLN: 2.0000 | Freq: Two times a day (BID) | NASAL | Status: DC

## 2013-03-07 NOTE — ED Provider Notes (Signed)
Lydia [name] is a 27 y.o. female who presents to Urgent Care today for headache. Patient notes a bitemporal headache present for the past several days. Additionally she notes some nasal congestion. She suspects a sinus headache. She denies any fevers chills nasal discharge nausea vomiting or diarrhea. She denies any weakness numbness loss of coordination difficulty swallowing or speaking. She has a history of chronic headaches but hasn't had much in the past 2 or 3 years. She notes that ibuprofen typically helps her headache. She has tried 4 mg of ibuprofen and Excedrin which has helped only a little.    Past Medical History  Diagnosis Date  . Chronic headaches     none x 2-3 yrs  . Meningitis    History  Substance Use Topics  . Smoking status: Never Smoker   . Smokeless tobacco: Never Used  . Alcohol Use: Yes     Comment: past h/o alcohol use: socially/occasioinally   ROS as above Medications reviewed. No current facility-administered medications for this encounter.   Current Outpatient Prescriptions  Medication Sig Dispense Refill  . norethindrone-ethinyl estradiol (NECON,BREVICON,MODICON) 0.5-35 MG-MCG tablet Take 1 tablet by mouth daily.      Marland Kitchen ibuprofen (ADVIL,MOTRIN) 800 MG tablet Take 1 tablet (800 mg total) by mouth 3 (three) times daily.  21 tablet  0  . ipratropium (ATROVENT) 0.03 % nasal spray Place 2 sprays into the nose every 12 (twelve) hours.  30 mL  1    Exam:  BP 118/76  Pulse 65  Temp(Src) 98.9 F (37.2 C) (Oral)  Resp 20  SpO2 100%  LMP 03/07/2013 Gen: Well NAD HEENT: EOMI,  MMM, PERRLA. Funduscopic exam is normal bilaterally. Normal-appearing sinuses. Nontender maxillary sinuses. Lungs: CTABL Nl WOB Heart: RRR no MRG Abd: NABS, NT, ND Exts: Non edematous BL  LE, warm and well perfused.  Neuro: Alert and oriented cranial nerves II through XII are intact normal coordination sensation and gait.  No results found for this or any previous visit (from the  past 24 hour(s)). No results found.  Assessment and Plan: 27 y.o. female with headache. Plan to treat with ibuprofen as this has worked well for her in the past. We'll followup with primary care provider if not improving.  Additionally patient has some nasal discharge. Doubtful for sinusitis. Plan to treat with Atrovent nasal spray as this may be helpful. Again followup with primary care provider Discussed warning signs or symptoms. Please see discharge instructions. Patient expresses understanding.      Rodolph Bong, MD 03/07/13 [phone]

## 2013-03-07 NOTE — ED Notes (Signed)
Reports waking up with dizziness, temporal HA wrapping around bilat sides of head.  Also c/o nasal "burning", but denies any nasal congestion or discharge.  Has tried Motrin and Excedrin without relief.

## 2013-03-18 ENCOUNTER — Other Ambulatory Visit: Payer: Self-pay

## 2013-04-15 IMAGING — US US OB DETAIL+14 WK
1 series · 14 of 28 positions shown · non-contrast
Comparison: none

[Series 1: us ob detail+14 wk · 0.18mm/px · 14 of 114 slices shown]
[im 5/114]
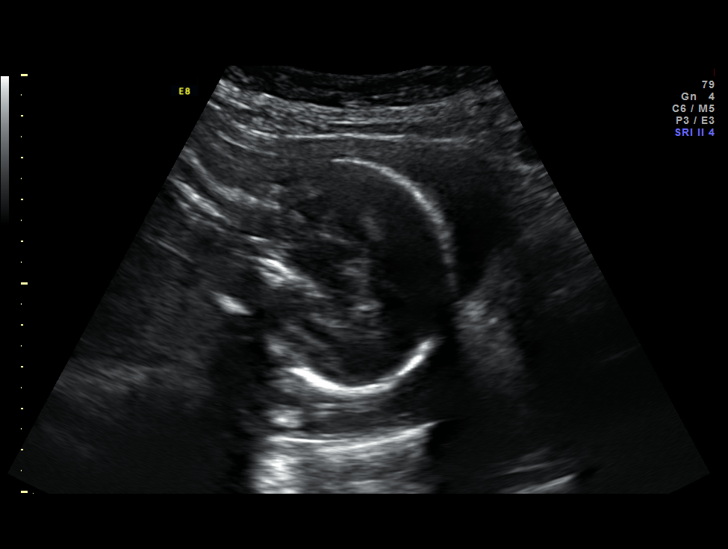
[im 13/114]
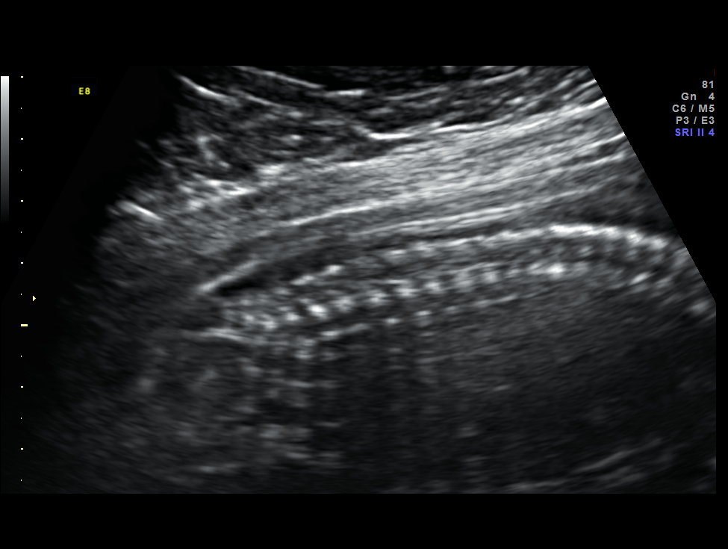
[im 21/114]
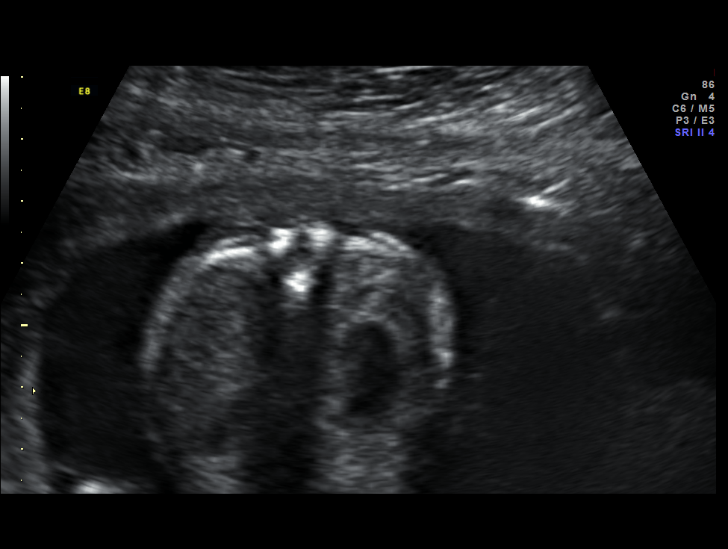
[im 30/114]
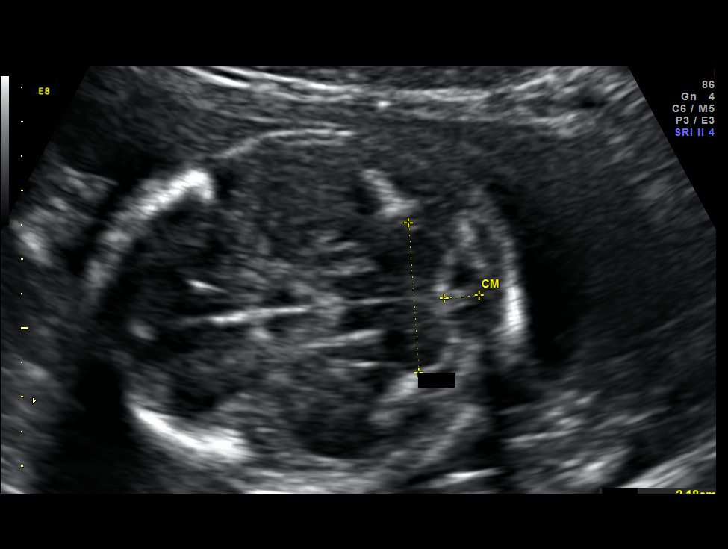
[im 38/114]
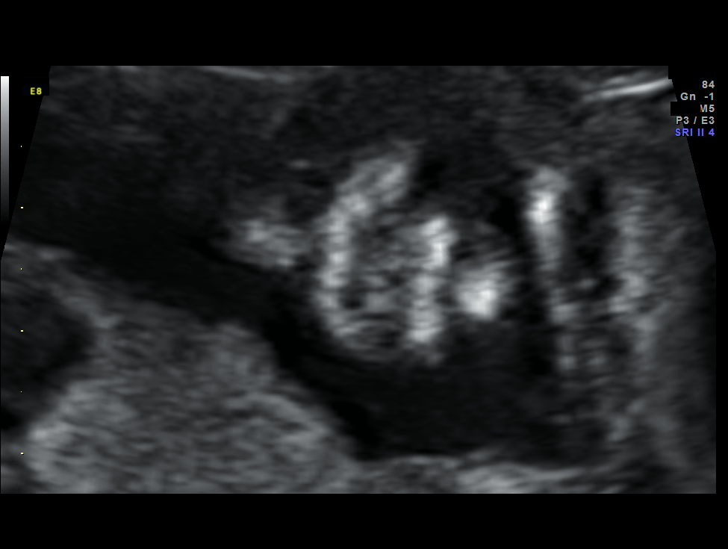
[im 47/114]
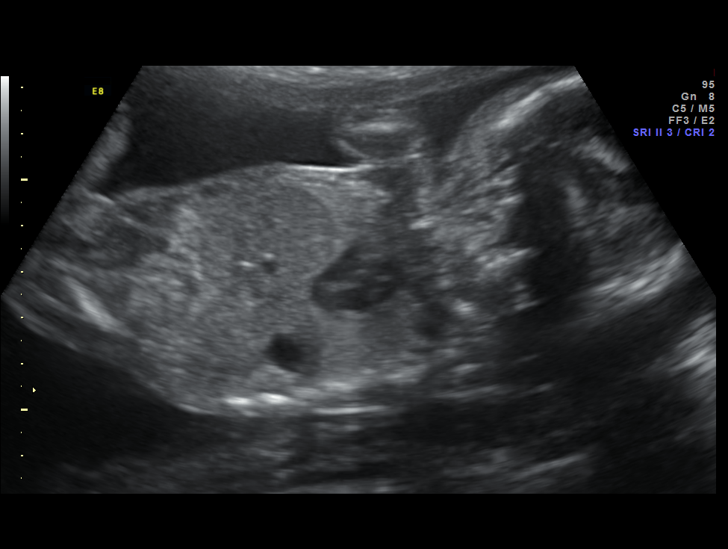
[im 55/114]
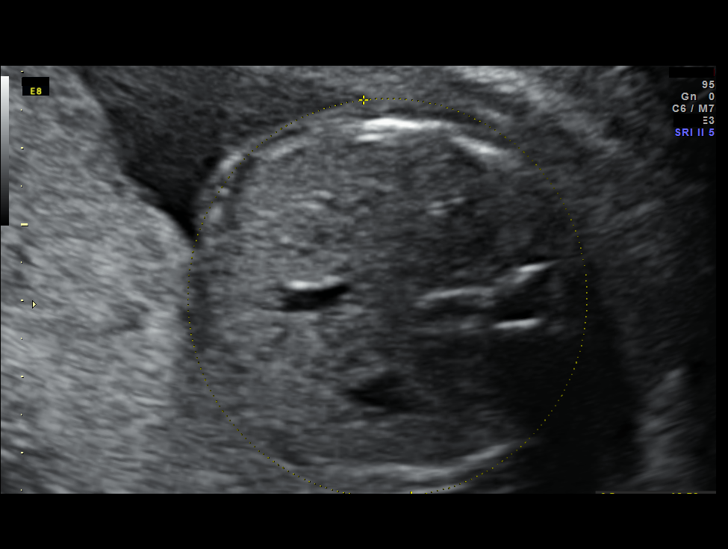
[im 63/114]
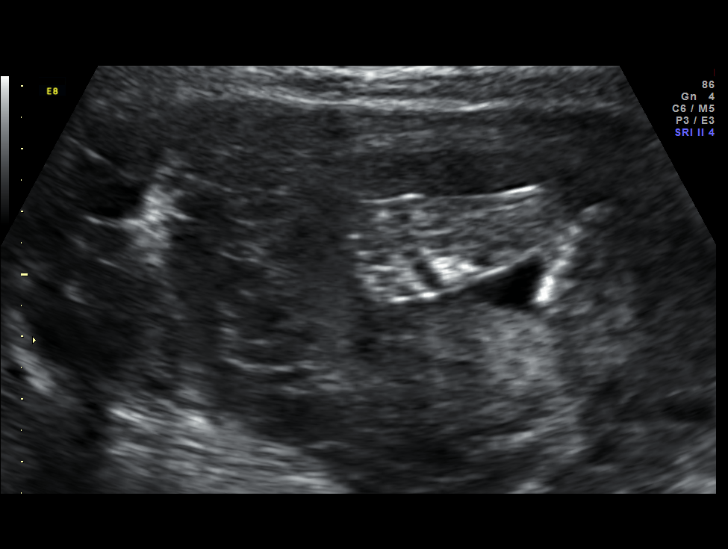
[im 72/114]
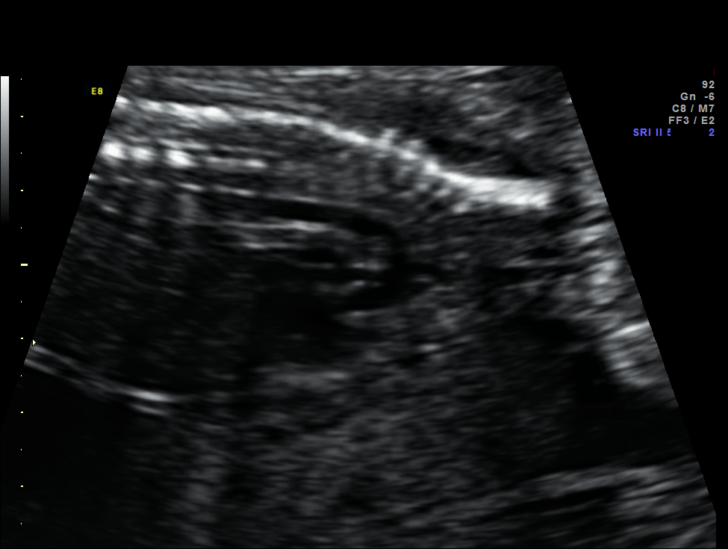
[im 80/114]
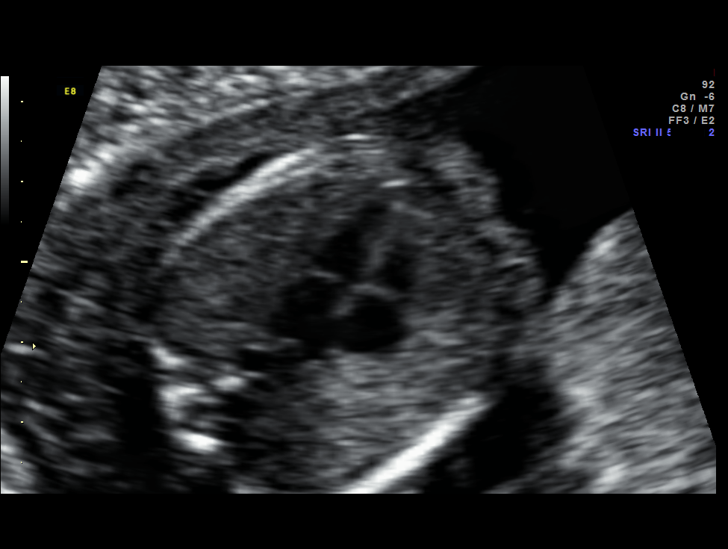
[im 88/114]
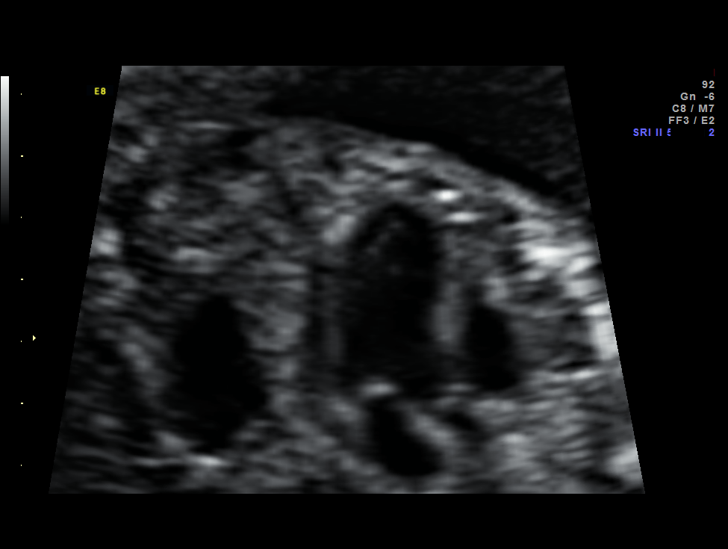
[im 97/114]
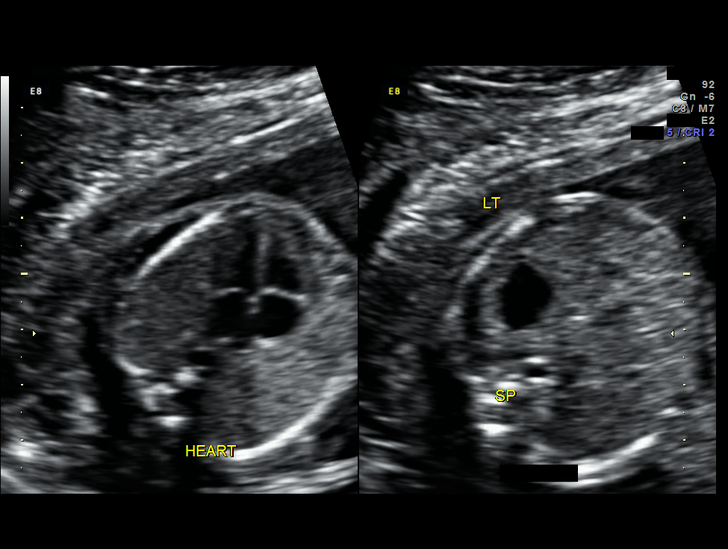
[im 105/114]
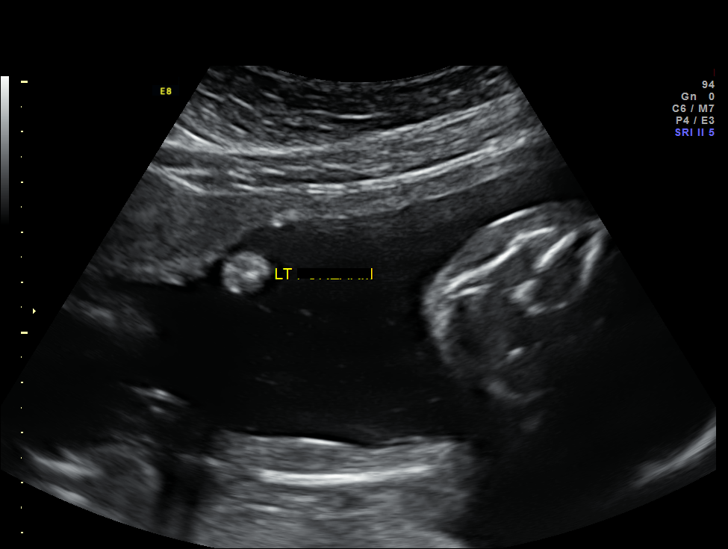
[im 114/114]
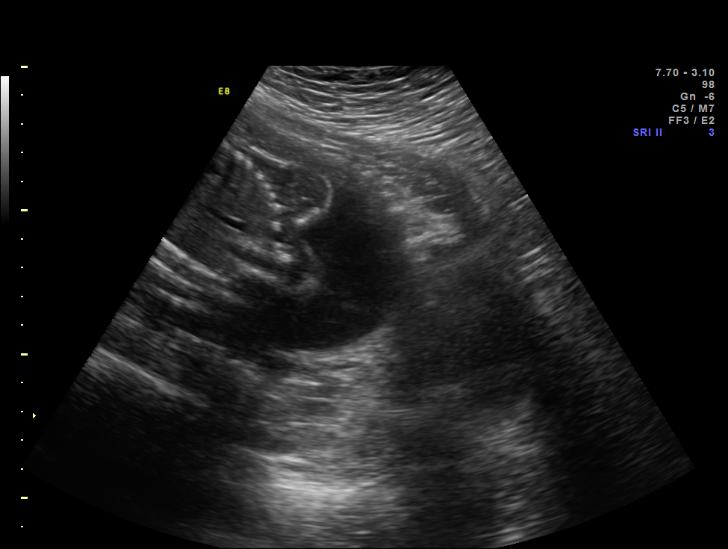

[14 of 28 positions shown; findings below may reference images not displayed]

Canned report from images found in remote index.

Refer to host system for actual result text.

## 2013-05-09 ENCOUNTER — Emergency Department (HOSPITAL_COMMUNITY)
Admission: EM | Admit: 2013-05-09 | Discharge: 2013-05-09 | Disposition: A | Discharge status: Home / Self Care | Payer: Medicaid Other | Attending: Emergency Medicine | Admitting: Emergency Medicine

## 2013-05-09 ENCOUNTER — Encounter (HOSPITAL_COMMUNITY): Payer: Self-pay | Admitting: Emergency Medicine

## 2013-05-09 DIAGNOSIS — Z880 Allergy status to penicillin: Secondary | ICD-10-CM | POA: Insufficient documentation

## 2013-05-09 DIAGNOSIS — K5900 Constipation, unspecified: Secondary | ICD-10-CM | POA: Insufficient documentation

## 2013-05-09 DIAGNOSIS — Z79899 Other long term (current) drug therapy: Secondary | ICD-10-CM | POA: Insufficient documentation

## 2013-05-09 DIAGNOSIS — R1013 Epigastric pain: Secondary | ICD-10-CM | POA: Insufficient documentation

## 2013-05-09 DIAGNOSIS — G8929 Other chronic pain: Secondary | ICD-10-CM | POA: Insufficient documentation

## 2013-05-09 DIAGNOSIS — K3189 Other diseases of stomach and duodenum: Secondary | ICD-10-CM | POA: Insufficient documentation

## 2013-05-09 LAB — CBC WITH DIFFERENTIAL/PLATELET
Basophils Relative: 0 % (ref 0–1)
Eosinophils Absolute: 0.1 10*3/uL (ref 0.0–0.7)
Lymphs Abs: 2.9 10*3/uL (ref 0.7–4.0)
MCH: 29.5 pg (ref 26.0–34.0)
Neutrophils Relative %: 64 % (ref 43–77)
Platelets: 358 10*3/uL (ref 150–400)
RBC: 4.21 MIL/uL (ref 3.87–5.11)

## 2013-05-09 LAB — URINALYSIS, ROUTINE W REFLEX MICROSCOPIC
Bilirubin Urine: NEGATIVE
Hgb urine dipstick: NEGATIVE
Specific Gravity, Urine: 1.008 (ref 1.005–1.030)
pH: 8 (ref 5.0–8.0)

## 2013-05-09 LAB — COMPREHENSIVE METABOLIC PANEL
ALT: 7 U/L (ref 0–35)
Albumin: 3.4 g/dL — ABNORMAL LOW (ref 3.5–5.2)
Alkaline Phosphatase: 45 U/L (ref 39–117)
Glucose, Bld: 131 mg/dL — ABNORMAL HIGH (ref 70–99)
Potassium: 3.4 mEq/L — ABNORMAL LOW (ref 3.5–5.1)
Sodium: 137 mEq/L (ref 135–145)
Total Protein: 6.8 g/dL (ref 6.0–8.3)

## 2013-05-09 MED ORDER — GI COCKTAIL ~~LOC~~
30.0000 mL | Freq: Once | ORAL | Status: AC
  Administered 2013-05-09: 30 mL via ORAL
  Filled 2013-05-09: qty 30

## 2013-05-09 MED ORDER — POLYETHYLENE GLYCOL 3350 17 G PO PACK: 17.0000 g | PACK | Freq: Every day | ORAL | Status: DC

## 2013-05-09 MED ORDER — OMEPRAZOLE 40 MG PO CPDR: 40.0000 mg | DELAYED_RELEASE_CAPSULE | Freq: Every day | ORAL | Status: DC

## 2013-05-09 NOTE — ED Provider Notes (Signed)
CSN: [id number]     Arrival date & time 05/09/13  1223 History   First MD Initiated Contact with Patient 05/09/13 1507     Chief Complaint  Patient presents with  . Abdominal Pain   (Consider location/radiation/quality/duration/timing/severity/associated sxs/prior Treatment) Patient is a 27 y.o. female presenting with abdominal pain.  Abdominal Pain Associated symptoms: no chest pain, no hematuria, no shortness of breath, no vaginal bleeding and no vaginal discharge    27 yo female presents with gradual onset epigastric pain since 05/06/13 that started following a meal at around noon. Patient states that she has had this pain in the past and it usually resolves on its own. Patient states this episode has been constant and rated at a 4/10 sharp pain with some dyspepsia. Pain does not radiate. Patient has tried tylenol without improvement. Pain better with laying flat on back. Pain worse with eating, or sitting up. Patient admits to hx of esophageal reflux. Admits to eating spicy foods. Denies alcohol or tobacco use. Denies chest pain, dyspnea, fever/chills, N/V/D. Admits to some constipation that is not new. Patient does not recall her last BM, but states has not had one since pain started. Admits to sick contact, daugther, with the flu. Patient denies any flu-like sxs. Patient is sexually active, currently on OCPs. LMP was last week and reported to be normal and regular. Patient denies pelvic pain, discharge, or vaginal pain. Admits to family hx of kidney stones but no personal hx.    Past Medical History  Diagnosis Date  . Chronic headaches     none x 2-3 yrs  . Meningitis    Past Surgical History  Procedure Laterality Date  . No past surgeries     Family History  Problem Relation Age of Onset  . Hypertension Mother    History  Substance Use Topics  . Smoking status: Never Smoker   . Smokeless tobacco: Never Used  . Alcohol Use: Yes     Comment: past h/o alcohol use:  socially/occasioinally   OB History   Grav Para Term Preterm Abortions TAB SAB Ect Mult Living   3 2 2  0 1 0 1 0 0 2     Review of Systems  Respiratory: Negative for shortness of breath.   Cardiovascular: Negative for chest pain.  Gastrointestinal: Positive for abdominal pain. Negative for blood in stool and abdominal distention.  Genitourinary: Negative for urgency, frequency, hematuria, flank pain, vaginal bleeding, vaginal discharge, difficulty urinating and menstrual problem.  Musculoskeletal: Negative for myalgias.  Neurological: Negative for weakness and headaches.    Allergies  Penicillins  Home Medications   Current Outpatient Rx  Name  Route  Sig  Dispense  Refill  . Acetaminophen (TYLENOL PO)   Oral   Take 2 tablets by mouth daily as needed (for pain).         . norethindrone-ethinyl estradiol (NECON,BREVICON,MODICON) 0.5-35 MG-MCG tablet   Oral   Take 1 tablet by mouth daily.         Marland Kitchen omeprazole (PRILOSEC) 40 MG capsule   Oral   Take 1 capsule (40 mg total) by mouth daily.   30 capsule   0   . polyethylene glycol (MIRALAX / GLYCOLAX) packet   Oral   Take 17 g by mouth daily.   14 each   0    BP 107/77  Pulse 72  Temp(Src) 98.5 F (36.9 C) (Oral)  Resp 18  SpO2 100% Physical Exam  Nursing note and vitals reviewed. Constitutional:  She is oriented to person, place, and time. She appears well-developed and well-nourished. No distress.  HENT:  Head: Normocephalic and atraumatic.  Eyes: Conjunctivae are normal.  Neck: Normal range of motion. Neck supple.  Cardiovascular: Normal rate, regular rhythm and normal heart sounds.  Exam reveals no gallop and no friction rub.   No murmur heard. Pulmonary/Chest: Effort normal and breath sounds normal. No respiratory distress. She has no wheezes. She has no rales.  Abdominal: Soft. Bowel sounds are normal. She exhibits no distension, no fluid wave, no abdominal bruit and no mass. There is tenderness in the  epigastric area and suprapubic area. There is no rebound, no guarding and no CVA tenderness.  Musculoskeletal: Normal range of motion. She exhibits no edema.  Neurological: She is alert and oriented to person, place, and time.  Skin: Skin is warm and dry. She is not diaphoretic.  Psychiatric: She has a normal mood and affect. Her speech is normal and behavior is normal. Cognition and memory are normal.    ED Course  Procedures (including critical care time) Labs Review Labs Reviewed  COMPREHENSIVE METABOLIC PANEL - Abnormal; Notable for the following:    Potassium 3.4 (*)    Glucose, Bld 131 (*)    Albumin 3.4 (*)    All other components within normal limits  CBC WITH DIFFERENTIAL  LIPASE, BLOOD  URINALYSIS, ROUTINE W REFLEX MICROSCOPIC   Imaging Review No results found.  EKG Interpretation   None       MDM   1. Epigastric pain   2. Constipation     Patient in NAD. VS WNL.  No Leukocytosis. Lipase negative. UA Negative. Labs unremarkable.  Suspect Gastritis given patient hx and exam.   Discussed labs, and exam findings with patient. Advised follow up with PCP in 1 wk. Patient provided resource guide for reference. Recommend return to ED should symptoms worsen. Patient agrees with plan. Discharged in good condition.   Meds given in ED:  Medications  gi cocktail (Maalox,Lidocaine,Donnatal) (30 mLs Oral Given 05/09/13 1523)    Discharge Medication List as of 05/09/2013  4:42 PM    START taking these medications   Details  omeprazole (PRILOSEC) 40 MG capsule Take 1 capsule (40 mg total) by mouth daily., Starting 05/09/2013, Until Discontinued, Print    polyethylene glycol (MIRALAX / GLYCOLAX) packet Take 17 g by mouth daily., Starting 05/09/2013, Until Discontinued, Print           Allen Norris [place], [place] 05/10/13 [phone]

## 2013-05-09 NOTE — ED Notes (Signed)
Pt c/o abd pain after eating x 5 days after eating something spicy per pt

## 2013-05-10 NOTE — ED Provider Notes (Signed)
Medical screening examination/treatment/procedure(s) were performed by non-physician practitioner and as supervising physician I was immediately available for consultation/collaboration.  EKG Interpretation   None         Keyshawna Prouse S Macsen Nuttall, MD 05/10/13 1144 

## 2013-05-13 HISTORY — PX: UPPER GASTROINTESTINAL ENDOSCOPY: SHX188

## 2013-07-06 ENCOUNTER — Encounter: Payer: Self-pay | Admitting: Obstetrics

## 2013-07-06 ENCOUNTER — Ambulatory Visit (INDEPENDENT_AMBULATORY_CARE_PROVIDER_SITE_OTHER): Payer: Medicaid Other | Admitting: Obstetrics

## 2013-07-06 VITALS — BP 125/78 | HR 73 | Wt 131.0 lb

## 2013-07-06 DIAGNOSIS — R102 Pelvic and perineal pain unspecified side: Secondary | ICD-10-CM | POA: Insufficient documentation

## 2013-07-06 DIAGNOSIS — N949 Unspecified condition associated with female genital organs and menstrual cycle: Secondary | ICD-10-CM

## 2013-07-06 HISTORY — DX: Pelvic and perineal pain: R10.2

## 2013-07-06 LAB — POCT URINE PREGNANCY: Preg Test, Ur: NEGATIVE

## 2013-07-06 MED ORDER — IBUPROFEN 800 MG PO TABS: 800.0000 mg | ORAL_TABLET | Freq: Three times a day (TID) | ORAL | Status: DC | PRN

## 2013-07-06 NOTE — Progress Notes (Signed)
Subjective:     Lydia [name] is a 28 y.o. female here for a routine exam.  Current complaints: pt states that she is having ovarian pain during and after intercourse.  Pt states that this has been occurring for the past week.  Pt states that she will also have lower back pain.  Pt states that she will have intermittent dizzy spells.    Personal health questionnaire reviewed: yes.   Gynecologic History Patient's last menstrual period was 07/03/2013. Contraception: OCP (estrogen/progesterone) Last Pap: 10/2012. Results were: normal Last mammogram: n/a  Obstetric History OB History  Gravida Para Term Preterm AB SAB TAB Ectopic Multiple Living  3 2 2  0 1 1 0 0 0 2    # Outcome Date GA Lbr Len/2nd Weight Sex Delivery Anes PTL Lv  3 TRM 12/30/10 [redacted]w[redacted]d 10:50 / 00:29 6 lb 1.4 oz (2.761 kg) F SVD EPI  Y     Comments: left arm below elbow missing  2 TRM 11/28/04 [redacted]w[redacted]d  6 lb 14 oz (3.118 kg) F SVD EPI  Y  1 SAB                The following portions of the patient's history were reviewed and updated as appropriate: allergies, current medications, past family history, past medical history, past social history, past surgical history and problem list.  Review of Systems Pertinent items are noted in HPI.    Objective:    General appearance: alert and no distress Abdomen: normal findings: soft, non-tender Pelvic: cervix normal in appearance, external genitalia normal, no adnexal masses or tenderness, no cervical motion tenderness, rectovaginal septum normal, uterus normal size, shape, and consistency and vagina normal without discharge    Assessment:    Pelvic pain.   Plan:    Education reviewed: safe sex/STD prevention and management of pelvic pain. Contraception: OCP (estrogen/progesterone). Follow up in: 2 weeks. Ultrasound ordered Ibuprofen

## 2013-07-07 ENCOUNTER — Other Ambulatory Visit: Payer: Self-pay | Admitting: Obstetrics

## 2013-07-07 ENCOUNTER — Ambulatory Visit (HOSPITAL_COMMUNITY)
Admission: RE | Admit: 2013-07-07 | Discharge: 2013-07-07 | Disposition: A | Discharge status: Home / Self Care | Payer: Medicaid Other | Source: Ambulatory Visit | Attending: Obstetrics | Admitting: Obstetrics

## 2013-07-07 ENCOUNTER — Other Ambulatory Visit: Payer: Self-pay | Admitting: *Deleted

## 2013-07-07 DIAGNOSIS — R102 Pelvic and perineal pain: Secondary | ICD-10-CM

## 2013-07-07 DIAGNOSIS — N949 Unspecified condition associated with female genital organs and menstrual cycle: Secondary | ICD-10-CM | POA: Insufficient documentation

## 2013-07-07 LAB — WET PREP BY MOLECULAR PROBE
Candida species: NEGATIVE
Gardnerella vaginalis: NEGATIVE
TRICHOMONAS VAG: NEGATIVE

## 2013-07-07 MED ORDER — TRAMADOL HCL 50 MG PO TABS: 100.0000 mg | ORAL_TABLET | Freq: Four times a day (QID) | ORAL | Status: DC | PRN

## 2013-07-09 LAB — GC/CHLAMYDIA PROBE AMP
CT Probe RNA: NEGATIVE
GC Probe RNA: NEGATIVE

## 2013-07-20 ENCOUNTER — Encounter: Payer: Self-pay | Admitting: Obstetrics

## 2013-07-20 ENCOUNTER — Ambulatory Visit (INDEPENDENT_AMBULATORY_CARE_PROVIDER_SITE_OTHER): Payer: Medicaid Other | Admitting: Obstetrics

## 2013-07-20 VITALS — BP 109/70 | HR 75 | Temp 98.7°F | Ht 60.0 in | Wt 133.0 lb

## 2013-07-20 DIAGNOSIS — N949 Unspecified condition associated with female genital organs and menstrual cycle: Secondary | ICD-10-CM

## 2013-07-20 NOTE — Progress Notes (Signed)
Subjective:     Lydia [name] is a 28 y.o. female here for a routine follow up exam.  Current complaints: Patient in office today for ultrasound results. Patient states she has no other concerns at this time.  Personal health questionnaire reviewed: yes.   Gynecologic History Patient's last menstrual period was 07/03/2013. Contraception: OCP (estrogen/progesterone)   Obstetric History OB History  Gravida Para Term Preterm AB SAB TAB Ectopic Multiple Living  3 2 2  0 1 1 0 0 0 2    # Outcome Date GA Lbr Len/2nd Weight Sex Delivery Anes PTL Lv  3 TRM 12/30/10 [redacted]w[redacted]d 10:50 / 00:29 6 lb 1.4 oz (2.761 kg) F SVD EPI  Y     Comments: left arm below elbow missing  2 TRM 11/28/04 [redacted]w[redacted]d  6 lb 14 oz (3.118 kg) F SVD EPI  Y  1 SAB                The following portions of the patient's history were reviewed and updated as appropriate: allergies, current medications, past family history, past medical history, past social history, past surgical history and problem list.  Review of Systems Pertinent items are noted in HPI.    Objective:    No exam performed today, Consult only.    Assessment:    Pelvic pain.  U/S negative.   Plan:    Education reviewed: safe sex/STD prevention and management of pelvic pain. Contraception: OCP (estrogen/progesterone). Follow up in: several months. Continue Tramadol prn.

## 2013-09-09 ENCOUNTER — Emergency Department (HOSPITAL_COMMUNITY)
Admission: EM | Admit: 2013-09-09 | Discharge: 2013-09-09 | Disposition: A | Discharge status: Home / Self Care | Payer: Medicaid Other | Attending: Emergency Medicine | Admitting: Emergency Medicine

## 2013-09-09 ENCOUNTER — Emergency Department (HOSPITAL_COMMUNITY): Payer: Medicaid Other

## 2013-09-09 ENCOUNTER — Encounter (HOSPITAL_COMMUNITY): Payer: Self-pay | Admitting: Emergency Medicine

## 2013-09-09 DIAGNOSIS — R110 Nausea: Secondary | ICD-10-CM | POA: Insufficient documentation

## 2013-09-09 DIAGNOSIS — Z79899 Other long term (current) drug therapy: Secondary | ICD-10-CM | POA: Insufficient documentation

## 2013-09-09 DIAGNOSIS — R109 Unspecified abdominal pain: Secondary | ICD-10-CM

## 2013-09-09 DIAGNOSIS — Z3202 Encounter for pregnancy test, result negative: Secondary | ICD-10-CM | POA: Insufficient documentation

## 2013-09-09 DIAGNOSIS — R1013 Epigastric pain: Secondary | ICD-10-CM | POA: Insufficient documentation

## 2013-09-09 DIAGNOSIS — Z8661 Personal history of infections of the central nervous system: Secondary | ICD-10-CM | POA: Insufficient documentation

## 2013-09-09 DIAGNOSIS — Z880 Allergy status to penicillin: Secondary | ICD-10-CM | POA: Insufficient documentation

## 2013-09-09 LAB — COMPREHENSIVE METABOLIC PANEL
ALK PHOS: 39 U/L (ref 39–117)
ALT: 6 U/L (ref 0–35)
AST: 14 U/L (ref 0–37)
Albumin: 3.6 g/dL (ref 3.5–5.2)
BILIRUBIN TOTAL: 0.4 mg/dL (ref 0.3–1.2)
BUN: 7 mg/dL (ref 6–23)
CHLORIDE: 99 meq/L (ref 96–112)
CO2: 26 meq/L (ref 19–32)
Calcium: 9.5 mg/dL (ref 8.4–10.5)
Creatinine, Ser: 0.62 mg/dL (ref 0.50–1.10)
GFR calc Af Amer: >90 mL/min (ref >90)
Glucose, Bld: 77 mg/dL (ref 70–99)
POTASSIUM: 3.8 meq/L (ref 3.7–5.3)
Sodium: 136 mEq/L — ABNORMAL LOW (ref 137–147)
Total Protein: 6.8 g/dL (ref 6.0–8.3)

## 2013-09-09 LAB — CBC WITH DIFFERENTIAL/PLATELET
Basophils Absolute: 0 10*3/uL (ref 0.0–0.1)
Basophils Relative: 0 % (ref 0–1)
Eosinophils Absolute: 0.1 10*3/uL (ref 0.0–0.7)
Eosinophils Relative: 1 % (ref 0–5)
HEMATOCRIT: 37.5 % (ref 36.0–46.0)
Hemoglobin: 12.8 g/dL (ref 12.0–15.0)
LYMPHS ABS: 3.8 10*3/uL (ref 0.7–4.0)
LYMPHS PCT: 38 % (ref 12–46)
MCH: 29.7 pg (ref 26.0–34.0)
MCHC: 34.1 g/dL (ref 30.0–36.0)
MCV: 87 fL (ref 78.0–100.0)
MONO ABS: 0.4 10*3/uL (ref 0.1–1.0)
Monocytes Relative: 4 % (ref 3–12)
NEUTROS ABS: 5.6 10*3/uL (ref 1.7–7.7)
Neutrophils Relative %: 56 % (ref 43–77)
Platelets: 359 10*3/uL (ref 150–400)
RBC: 4.31 MIL/uL (ref 3.87–5.11)
RDW: 12 % (ref 11.5–15.5)
WBC: 9.9 10*3/uL (ref 4.0–10.5)

## 2013-09-09 LAB — URINALYSIS, ROUTINE W REFLEX MICROSCOPIC
Bilirubin Urine: NEGATIVE
GLUCOSE, UA: NEGATIVE mg/dL
HGB URINE DIPSTICK: NEGATIVE
KETONES UR: NEGATIVE mg/dL
Leukocytes, UA: NEGATIVE
Nitrite: NEGATIVE
PROTEIN: NEGATIVE mg/dL
Specific Gravity, Urine: 1.028 (ref 1.005–1.030)
UROBILINOGEN UA: 0.2 mg/dL (ref 0.0–1.0)
pH: 6 (ref 5.0–8.0)

## 2013-09-09 LAB — POC URINE PREG, ED: PREG TEST UR: NEGATIVE

## 2013-09-09 MED ORDER — FAMOTIDINE 20 MG PO TABS: 20.0000 mg | ORAL_TABLET | Freq: Two times a day (BID) | ORAL | Status: DC

## 2013-09-09 MED ORDER — MORPHINE SULFATE 4 MG/ML IJ SOLN
4.0000 mg | Freq: Once | INTRAMUSCULAR | Status: AC
  Administered 2013-09-09: 4 mg via INTRAVENOUS
  Filled 2013-09-09: qty 1

## 2013-09-09 MED ORDER — ONDANSETRON HCL 4 MG/2ML IJ SOLN
4.0000 mg | INTRAMUSCULAR | Status: AC
  Administered 2013-09-09: 4 mg via INTRAVENOUS
  Filled 2013-09-09: qty 2

## 2013-09-09 NOTE — Discharge Instructions (Signed)
Call a Gastroenterologist for further evaluation of your abdominal pain.  Call for a follow up appointment with a Family or Primary Care Provider.  Return if Symptoms worsen.   Take medication as prescribed.

## 2013-09-09 NOTE — ED Provider Notes (Signed)
CSN: [id number]     Arrival date & time 09/09/13  1330 History   First MD Initiated Contact with Patient 09/09/13 1413     Chief Complaint  Patient presents with  . Abdominal Pain     (Consider location/radiation/quality/duration/timing/severity/associated sxs/prior Treatment) HPI Comments: Patient is 28 year old female presenting to the emergency department with epigastric discomfort since yesterday. The patient reports similar pain over the past 4 months although full resolution of symptoms. Should worsen increased discomfort yesterday, after eating eggs.  She denies previous relation with food and increase or decrease in discomfort. She describes the discomfort as sharp, cramping.  She reports also without vomiting.  Denies history of abdominal surgeries, no personal history of gallbladder disease, kidney stone. Denies constipation, diarrhea, melanotic stool, bright red blood per rectum, dysuria, hematuria, fever. Patient's last menstrual period was 08/11/2013.   Patient is a 28 y.o. female presenting with abdominal pain. The history is provided by the patient and medical records. No language interpreter was used.  Abdominal Pain Associated symptoms: nausea   Associated symptoms: no chills, no constipation, no diarrhea, no dysuria, no fever, no hematuria, no vaginal bleeding, no vaginal discharge and no vomiting     Past Medical History  Diagnosis Date  . Chronic headaches     none x 2-3 yrs  . Meningitis    Past Surgical History  Procedure Laterality Date  . No past surgeries     Family History  Problem Relation Age of Onset  . Hypertension Mother    History  Substance Use Topics  . Smoking status: Never Smoker   . Smokeless tobacco: Never Used  . Alcohol Use: No   OB History   Grav Para Term Preterm Abortions TAB SAB Ect Mult Living   3 2 2  0 1 0 1 0 0 2     Review of Systems  Constitutional: Negative for fever and chills.  Gastrointestinal: Positive for nausea and  abdominal pain. Negative for vomiting, diarrhea, constipation and blood in stool.  Genitourinary: Negative for dysuria, hematuria, vaginal bleeding and vaginal discharge.  All other systems reviewed and are negative.     Allergies  Penicillins  Home Medications   Prior to Admission medications   Medication Sig Start Date End Date Taking? Authorizing Provider  norethindrone-ethinyl estradiol (NECON,BREVICON,MODICON) 0.5-35 MG-MCG tablet Take 1 tablet by mouth daily.   Yes Historical Provider, MD  traMADol (ULTRAM) 50 MG tablet Take 50 mg by mouth every 6 (six) hours as needed for moderate pain.   Yes Historical Provider, MD   BP 110/73  Pulse 76  Temp(Src) 98.1 F (36.7 C) (Oral)  Resp 16  SpO2 98%  LMP 08/11/2013 Physical Exam  Nursing note and vitals reviewed. Constitutional: She is oriented to person, place, and time. Vital signs are normal. She appears well-developed and well-nourished.  Non-toxic appearance. She does not have a sickly appearance. She does not appear ill. No distress.  HENT:  Head: Normocephalic and atraumatic.  Eyes: EOM are normal. Pupils are equal, round, and reactive to light.  Neck: Neck supple.  Cardiovascular: Normal rate and regular rhythm.   Pulmonary/Chest: Effort normal and breath sounds normal. No respiratory distress. She has no wheezes. She has no rales.  Abdominal: Soft. Bowel sounds are normal. She exhibits no mass. There is tenderness in the right upper quadrant, epigastric area and left upper quadrant. There is no rebound, no guarding and no CVA tenderness.  Neurological: She is alert and oriented to person, place, and time.  Skin: Skin is warm and dry. She is not diaphoretic.  Psychiatric: She has a normal mood and affect. Her behavior is normal.    ED Course  Procedures (including critical care time) Labs Review Labs Reviewed  COMPREHENSIVE METABOLIC PANEL - Abnormal; Notable for the following:    Sodium 136 (*)    All other  components within normal limits  URINALYSIS, ROUTINE W REFLEX MICROSCOPIC - Abnormal; Notable for the following:    Color, Urine AMBER (*)    APPearance CLOUDY (*)    All other components within normal limits  CBC WITH DIFFERENTIAL  POC URINE PREG, ED    Imaging Review US Abdomen Complete  09/09/2013   CLINICAL DATA:  Right upper quadrant pain and tenderness.  Nausea.  EXAM: ULTRASOUND ABDOMEN COMPLETE  COMPARISON:  None.  FINDINGS: Gallbladder:  No gallstones or wall thickening visualized. No sonographic Murphy sign noted.  Common bile duct:  Diameter: 2 mm  Liver:  No focal lesion identified. Within normal limits in parenchymal echogenicity.  IVC:  No abnormality visualized.  Pancreas:  Not well visualized due to overlying bowel gas.  Spleen:  Size and appearance within normal limits.  Right Kidney:  Length: 11.6 cm. Echogenicity within normal limits. No mass or hydronephrosis visualized.  Left Kidney:  Length: 10.6 cm. Echogenicity within normal limits. No mass or hydronephrosis visualized.  Abdominal aorta:  No aneurysm visualized.  Other findings:  None.  IMPRESSION: Negative. No evidence gallstones, hydronephrosis, or other significant abnormality.   Electronically Signed   By: Earle Gell M.D.   On: 09/09/2013 15:18     EKG Interpretation None      MDM   Final diagnoses:  Abdominal pain   Patient with persistent epigastric discomfort, ultrasound of abdomen for possible gallbladder disfunction. No acute abdomen on exam. Labs unremarkable. [place] without evidence of gall bladder disease. Questionable PUD vs Reflux. Gi referral given Discussed lab results, imaging results, and treatment plan with the patient. Return precautions given. Reports understanding and no other concerns at this time.  Patient is stable for discharge at this time.   Meds given in ED:  Medications  morphine 4 MG/ML injection 4 mg (4 mg Intravenous Given 09/09/13 1439)  ondansetron (ZOFRAN) injection 4 mg (4 mg  Intravenous Given 09/09/13 1439)    Discharge Medication List as of 09/09/2013  3:50 PM    START taking these medications   Details  famotidine (PEPCID) 20 MG tablet Take 1 tablet (20 mg total) by mouth 2 (two) times daily., Starting 09/09/2013, Until Discontinued, Print             Lorrine Kin, PA-C 09/10/13 1505

## 2013-09-09 NOTE — ED Notes (Signed)
Per pt, upper abdominal pain since Dec-no vomiting, nauseated

## 2013-09-09 NOTE — ED Notes (Signed)
Bed: [id number] Expected date:  Expected time:  Means of arrival:  Comments: No monitor

## 2013-09-11 NOTE — ED Provider Notes (Signed)
Medical screening examination/treatment/procedure(s) were performed by non-physician practitioner and as supervising physician I was immediately available for consultation/collaboration.   EKG Interpretation None        Orpah Greek, MD 09/11/13 [phone]

## 2013-09-14 ENCOUNTER — Other Ambulatory Visit: Payer: Self-pay | Admitting: Obstetrics

## 2013-09-14 DIAGNOSIS — K2970 Gastritis, unspecified, without bleeding: Secondary | ICD-10-CM

## 2013-10-21 ENCOUNTER — Other Ambulatory Visit: Payer: Self-pay | Admitting: *Deleted

## 2013-10-21 ENCOUNTER — Ambulatory Visit (INDEPENDENT_AMBULATORY_CARE_PROVIDER_SITE_OTHER): Payer: Medicaid Other | Admitting: Obstetrics

## 2013-10-21 ENCOUNTER — Other Ambulatory Visit: Payer: Self-pay | Admitting: Obstetrics

## 2013-10-21 DIAGNOSIS — N946 Dysmenorrhea, unspecified: Secondary | ICD-10-CM

## 2013-10-21 MED ORDER — NORETHINDRONE-ETH ESTRADIOL 0.5-35 MG-MCG PO TABS: 1.0000 | ORAL_TABLET | Freq: Every day | ORAL | Status: DC

## 2013-11-03 ENCOUNTER — Encounter: Payer: Self-pay | Admitting: Internal Medicine

## 2013-11-08 ENCOUNTER — Encounter: Payer: Self-pay | Admitting: Internal Medicine

## 2013-11-08 ENCOUNTER — Ambulatory Visit (INDEPENDENT_AMBULATORY_CARE_PROVIDER_SITE_OTHER): Payer: Medicaid Other | Admitting: Internal Medicine

## 2013-11-08 ENCOUNTER — Other Ambulatory Visit (INDEPENDENT_AMBULATORY_CARE_PROVIDER_SITE_OTHER): Payer: Medicaid Other

## 2013-11-08 VITALS — BP 90/58 | HR 92 | Ht 59.0 in | Wt 131.4 lb

## 2013-11-08 DIAGNOSIS — K5900 Constipation, unspecified: Secondary | ICD-10-CM | POA: Diagnosis not present

## 2013-11-08 DIAGNOSIS — R1013 Epigastric pain: Secondary | ICD-10-CM

## 2013-11-08 DIAGNOSIS — K3189 Other diseases of stomach and duodenum: Secondary | ICD-10-CM

## 2013-11-08 DIAGNOSIS — K219 Gastro-esophageal reflux disease without esophagitis: Secondary | ICD-10-CM

## 2013-11-08 DIAGNOSIS — K5909 Other constipation: Secondary | ICD-10-CM

## 2013-11-08 LAB — IGA: IGA: 170 mg/dL (ref 68–378)

## 2013-11-08 MED ORDER — LINACLOTIDE 145 MCG PO CAPS: 145.0000 ug | ORAL_CAPSULE | Freq: Every day | ORAL | Status: DC

## 2013-11-08 MED ORDER — PANTOPRAZOLE SODIUM 40 MG PO TBEC: 40.0000 mg | DELAYED_RELEASE_TABLET | Freq: Every day | ORAL | Status: DC

## 2013-11-08 NOTE — Patient Instructions (Signed)
Your physician has requested that you go to the basement for  lab work before leaving today.  We have sent the following medications to your pharmacy for you to pick up at your convenience: Linzess 145 mcg daily.  Pantoprazol 40 mg daily  Follow up in office in 3 months

## 2013-11-08 NOTE — Progress Notes (Signed)
Patient ID: Lydia [name], female   DOB: 05-08-1986, 28 y.o.   MRN: [id number] HPI: Lydia [name] is a 28 yo female with past medical history of chronic headaches who is seen in consultation at the request of Dr. Jodi Mourning to evaluate epigastric abdominal pain. She is here alone today. She reports long-standing problems with epigastric abdominal pain worse after eating. This is been particularly more severe since December 2014. The pain is located in the epigastrium without radiation. She also reports occasional heartburn. No dysphagia or odynophagia. She does have nausea but no vomiting. She reports the pain can be severe at times and seems to hurt worse when she is walking. She also reports occasional abdominal bloating. She had an abdominal ultrasound and was told her gallbladder was normal. She is currently using Pepcid 10 mg twice daily which does help with the pain but not entirely. She was given tramadol which she did not find helpful. Previous issues using ibuprofen but found this worsened her pain and she discontinued it. No NSAIDs now. She reports chronic constipation and uses Dulcolax several days per week. This can cause cramping in her lower abdomen but no lower abdominal pain outside of laxative use. She denies blood in her stool or melena. No family history of colon cancer or polyps.  Past Medical History  Diagnosis Date  . Chronic headaches     none x 2-3 yrs  . Meningitis     Past Surgical History  Procedure Laterality Date  . No past surgeries      Current Outpatient Prescriptions  Medication Sig Dispense Refill  . norethindrone-ethinyl estradiol (NECON,BREVICON,MODICON) 0.5-35 MG-MCG tablet Take 1 tablet by mouth daily.  1 Package  3  . traMADol (ULTRAM) 50 MG tablet Take 50 mg by mouth every 6 (six) hours as needed for moderate pain.      . Linaclotide (LINZESS) 145 MCG CAPS capsule Take 1 capsule (145 mcg total) by mouth daily.  30 capsule  6   No current facility-administered  medications for this visit.    Allergies  Allergen Reactions  . Penicillins Hives    Family History  Problem Relation Age of Onset  . Hypertension Mother   . Breast cancer Maternal Aunt     History  Substance Use Topics  . Smoking status: Never Smoker   . Smokeless tobacco: Never Used  . Alcohol Use: No    ROS: As per history of present illness, otherwise negative  BP 90/58  Pulse 92  Ht 4\' 11"  (1.499 m)  Wt 131 lb 6 oz (59.591 kg)  BMI 26.52 kg/m2  LMP 10/08/2013 Constitutional: Well-developed and well-nourished. No distress. HEENT: Normocephalic and atraumatic. Oropharynx is clear and moist. No oropharyngeal exudate. Conjunctivae are normal.  No scleral icterus. Neck: Neck supple. Trachea midline. Cardiovascular: Normal rate, regular rhythm and intact distal pulses. No M/R/G Pulmonary/chest: Effort normal and breath sounds normal. No wheezing, rales or rhonchi. Abdominal: Soft, mild epigastric pain without rebound or guarding, nondistended. Bowel sounds active throughout. There are no masses palpable. No hepatosplenomegaly. Extremities: no clubbing, cyanosis, or edema Lymphadenopathy: No cervical adenopathy noted. Neurological: Alert and oriented to person place and time. Skin: Skin is warm and dry. No rashes noted. Psychiatric: Normal mood and affect. Behavior is normal.  RELEVANT LABS AND IMAGING: CBC    Component Value Date/Time   WBC 9.9 09/09/2013 1441   RBC 4.31 09/09/2013 1441   HGB 12.8 09/09/2013 1441   HCT 37.5 09/09/2013 1441   PLT 359 09/09/2013  1441   MCV 87.0 09/09/2013 1441   MCH 29.7 09/09/2013 1441   MCHC 34.1 09/09/2013 1441   RDW 12.0 09/09/2013 1441   LYMPHSABS 3.8 09/09/2013 1441   MONOABS 0.4 09/09/2013 1441   EOSABS 0.1 09/09/2013 1441   BASOSABS 0.0 09/09/2013 1441    CMP     Component Value Date/Time   NA 136* 09/09/2013 1441   K 3.8 09/09/2013 1441   CL 99 09/09/2013 1441   CO2 26 09/09/2013 1441   GLUCOSE 77 09/09/2013 1441   BUN 7  09/09/2013 1441   CREATININE 0.62 09/09/2013 1441   CALCIUM 9.5 09/09/2013 1441   PROT 6.8 09/09/2013 1441   ALBUMIN 3.6 09/09/2013 1441   AST 14 09/09/2013 1441   ALT 6 09/09/2013 1441   ALKPHOS 39 09/09/2013 1441   BILITOT 0.4 09/09/2013 1441   GFRNONAA >90 09/09/2013 1441   GFRAA >90 09/09/2013 1441   ULTRASOUND ABDOMEN COMPLETE   COMPARISON:  None.   FINDINGS: Gallbladder:   No gallstones or wall thickening visualized. No sonographic Murphy sign noted.   Common bile duct:   Diameter: 2 mm   Liver:   No focal lesion identified. Within normal limits in parenchymal echogenicity.   IVC:   No abnormality visualized.   Pancreas:   Not well visualized due to overlying bowel gas.   Spleen:   Size and appearance within normal limits.   Right Kidney:   Length: 11.6 cm. Echogenicity within normal limits. No mass or hydronephrosis visualized.   Left Kidney:   Length: 10.6 cm. Echogenicity within normal limits. No mass or hydronephrosis visualized.   Abdominal aorta:   No aneurysm visualized.   Other findings:   None.   IMPRESSION: Negative. No evidence gallstones, hydronephrosis, or other significant abnormality.     Electronically Signed   By: Earle Gell M.D.   On: 09/09/2013 15:18   EXAM: TRANSABDOMINAL AND TRANSVAGINAL ULTRASOUND OF PELVIS   TECHNIQUE: Both transabdominal and transvaginal ultrasound examinations of the pelvis were performed. Transabdominal technique was performed for global imaging of the pelvis including uterus, ovaries, adnexal regions, and pelvic cul-de-sac. It was necessary to proceed with endovaginal exam following the transabdominal exam to visualize the endometrium.   COMPARISON:  10/14/2012   FINDINGS: Uterus   Measurements: 8.3 x 3.9 x 5.3 cm. No fibroids or other mass visualized.   Endometrium   Thickness: 3 mm.  No focal abnormality visualized.   Right ovary   Measurements: 2.7 x 1.7 x 2.7 cm. Normal  appearance/no adnexal mass.   Left ovary   Measurements: 2.6 x 1.8 x 1.3 cm. Normal appearance/no adnexal mass.   Other findings   No free fluid.   IMPRESSION: Negative pelvic ultrasound.     Electronically Signed   By: Julian Hy M.D.   On: 07/07/2013 10:07    ASSESSMENT/PLAN:  28 yo female with past medical history of chronic headaches who is seen in consultation at the request of Dr. Jodi Mourning to evaluate epigastric abdominal pain.  1.  Dyspepsia/GERD -- she does have symptoms consistent with dyspepsia and perhaps gastritis. I recommended pantoprazole 40 mg once daily. I am also checking a celiac panel and H. pylori antibody. I will see her back in 2-3 months. If symptoms fail to resolve completely, my recommendation would be for upper endoscopy. I asked that she continue to avoid NSAIDs  2. Chronic constipation -- chronic, long-standing constipation using over-the-counter laxatives with less than ideal result. She has tried fiber supplementation without  benefit. I will give her a trial of Linzess 145 mcg daily. We discussed the main side effect which is diarrhea and that she should call me if this occurs.  Return in 3 months, sooner if necessary

## 2013-11-09 LAB — H. PYLORI ANTIBODY, IGG: H Pylori IgG: NEGATIVE

## 2013-11-09 LAB — TISSUE TRANSGLUTAMINASE, IGA: Tissue Transglutaminase Ab, IgA: 3.4 U/mL (ref <20)

## 2013-11-15 ENCOUNTER — Ambulatory Visit: Payer: Medicaid Other | Admitting: Obstetrics

## 2013-12-08 ENCOUNTER — Ambulatory Visit (INDEPENDENT_AMBULATORY_CARE_PROVIDER_SITE_OTHER): Payer: Medicaid Other | Admitting: Obstetrics

## 2013-12-08 ENCOUNTER — Encounter: Payer: Self-pay | Admitting: Obstetrics

## 2013-12-08 VITALS — BP 99/64 | HR 66 | Temp 97.9°F | Ht 59.0 in | Wt 132.0 lb

## 2013-12-08 DIAGNOSIS — Z0000 Encounter for general adult medical examination without abnormal findings: Secondary | ICD-10-CM

## 2013-12-08 DIAGNOSIS — N739 Female pelvic inflammatory disease, unspecified: Secondary | ICD-10-CM | POA: Insufficient documentation

## 2013-12-08 DIAGNOSIS — R52 Pain, unspecified: Secondary | ICD-10-CM | POA: Insufficient documentation

## 2013-12-08 DIAGNOSIS — Z01419 Encounter for gynecological examination (general) (routine) without abnormal findings: Secondary | ICD-10-CM

## 2013-12-08 DIAGNOSIS — B360 Pityriasis versicolor: Secondary | ICD-10-CM | POA: Insufficient documentation

## 2013-12-08 HISTORY — DX: Pityriasis versicolor: B36.0

## 2013-12-08 HISTORY — DX: Pain, unspecified: R52

## 2013-12-08 MED ORDER — CLOTRIMAZOLE 1 % EX CREA: 1.0000 "application " | TOPICAL_CREAM | Freq: Two times a day (BID) | CUTANEOUS | Status: DC

## 2013-12-08 MED ORDER — DOXYCYCLINE HYCLATE 100 MG PO CAPS: 100.0000 mg | ORAL_CAPSULE | Freq: Two times a day (BID) | ORAL | Status: DC

## 2013-12-08 MED ORDER — OXYCODONE HCL 10 MG PO TABS: 10.0000 mg | ORAL_TABLET | Freq: Four times a day (QID) | ORAL | Status: DC | PRN

## 2013-12-08 MED ORDER — METRONIDAZOLE 500 MG PO TABS: 500.0000 mg | ORAL_TABLET | Freq: Two times a day (BID) | ORAL | Status: DC

## 2013-12-08 NOTE — Progress Notes (Addendum)
Subjective:     Lydia [name] is a 28 y.o. female here for a routine exam.  Current complaints: Pelvic pain, particularly with intercourse.    Personal health questionnaire:  Is [name], have a family history of breast and/or ovarian cancer: no Is there a family history of uterine cancer diagnosed at age < 53, gastrointestinal cancer, urinary tract cancer, family member who is a Field seismologist syndrome-associated carrier: no Is the patient overweight and hypertensive, family history of diabetes, personal history of gestational diabetes or PCOS: no Is patient over 60, have PCOS,  family history of premature CHD under age 61, diabetes, smoke, have hypertension or peripheral artery disease:  no At any time, has a partner hit, kicked or otherwise hurt or frightened you?: no Over the past 2 weeks, have you felt down, depressed or hopeless?: no Over the past 2 weeks, have you felt little interest or pleasure in doing things?:no   Gynecologic History Patient's last menstrual period was 10/08/2013. Contraception: OCP (estrogen/progesterone) Last Pap: 2014. Results were: normal Last mammogram: n/a. Results were: n/a  Obstetric History OB History  Gravida Para Term Preterm AB SAB TAB Ectopic Multiple Living  3 2 2  0 1 1 0 0 0 2    # Outcome Date GA Lbr Len/2nd Weight Sex Delivery Anes PTL Lv  3 TRM 12/30/10 [redacted]w[redacted]d 10:50 / 00:29 6 lb 1.4 oz (2.761 kg) F SVD EPI  Y     Comments: left arm below elbow missing  2 TRM 11/28/04 [redacted]w[redacted]d  6 lb 14 oz (3.118 kg) F SVD EPI  Y  1 SAB               Past Medical History  Diagnosis Date  . Chronic headaches     none x 2-3 yrs  . Meningitis     Past Surgical History  Procedure Laterality Date  . No past surgeries      Current outpatient prescriptions:Linaclotide (LINZESS) 145 MCG CAPS capsule, Take 1 capsule (145 mcg total) by mouth daily., Disp: 30 capsule, Rfl: 6;  norethindrone-ethinyl estradiol (NECON,BREVICON,MODICON) 0.5-35 MG-MCG tablet,  Take 1 tablet by mouth daily., Disp: 1 Package, Rfl: 3;  pantoprazole (PROTONIX) 40 MG tablet, Take 1 tablet (40 mg total) by mouth daily., Disp: 90 tablet, Rfl: 3 traMADol (ULTRAM) 50 MG tablet, Take 50 mg by mouth every 6 (six) hours as needed for moderate pain., Disp: , Rfl: ;  clotrimazole (LOTRIMIN) 1 % cream, Apply 1 application topically 2 (two) times daily., Disp: 45 g, Rfl: 2;  doxycycline (VIBRAMYCIN) 100 MG capsule, Take 1 capsule (100 mg total) by mouth 2 (two) times daily., Disp: 28 capsule, Rfl: 1 metroNIDAZOLE (FLAGYL) 500 MG tablet, Take 1 tablet (500 mg total) by mouth 2 (two) times daily., Disp: 28 tablet, Rfl: 1;  Oxycodone HCl 10 MG TABS, Take 1 tablet (10 mg total) by mouth every 6 (six) hours as needed., Disp: 40 tablet, Rfl: 0 Allergies  Allergen Reactions  . Penicillins Hives    History  Substance Use Topics  . Smoking status: Never Smoker   . Smokeless tobacco: Never Used  . Alcohol Use: No    Family History  Problem Relation Age of Onset  . Hypertension Mother   . Breast cancer Maternal Aunt       Review of Systems  Constitutional: negative for fatigue and weight loss Respiratory: negative for cough and wheezing Cardiovascular: negative for chest pain, fatigue and palpitations Gastrointestinal: negative for abdominal pain and change in  bowel habits Musculoskeletal:negative for myalgias Neurological: negative for gait problems and tremors Behavioral/Psych: negative for abusive relationship, depression Endocrine: negative for temperature intolerance   Genitourinary: positive for abnormal menstrual periods, pelvic pain.  Negative for genital lesions, hot flashes, sexual problems and vaginal discharge Integument/breast: negative for breast lump, breast tenderness, nipple discharge and skin lesion(s)    Objective:       BP 99/64  Pulse 66  Temp(Src) 97.9 F (36.6 C)  Ht 4\' 11"  (1.499 m)  Wt 132 lb (59.875 kg)  BMI 26.65 kg/m2  LMP 10/08/2013 General:    alert  Skin:    Rash left arm -  hypopigmented spots  Lungs:   clear to auscultation bilaterally  Heart:   regular rate and rhythm, S1, S2 normal, no murmur, click, rub or gallop  Breasts:   normal without suspicious masses, skin or nipple changes or axillary nodes  Abdomen:  normal findings: no organomegaly, soft, non-tender and no hernia  Pelvis:  External genitalia: normal general appearance Urinary system: urethral meatus normal and bladder without fullness, nontender Vaginal: normal without tenderness, induration or masses Cervix: normal appearance Adnexa: normal bimanual exam Uterus: anteverted and tender, normal size   Lab Review Urine pregnancy test Labs reviewed yes Radiologic studies reviewed no    Assessment:    Healthy female exam.    Probable mild endometritis, chronic  Pelvic pain and dyspareunia  Fungal rash left arm   Plan:    Doxy / Flagyl  X 14 days for endometritis  Oxycodone for pain  Clotrimazole Rx for Tinea Versicolor  Education reviewed: low fat, low cholesterol diet, safe sex/STD prevention, self breast exams and weight bearing exercise. Contraception: OCP (estrogen/progesterone). Follow up in: 3 months.   Meds ordered this encounter  Medications  . doxycycline (VIBRAMYCIN) 100 MG capsule    Sig: Take 1 capsule (100 mg total) by mouth 2 (two) times daily.    Dispense:  28 capsule    Refill:  1  . metroNIDAZOLE (FLAGYL) 500 MG tablet    Sig: Take 1 tablet (500 mg total) by mouth 2 (two) times daily.    Dispense:  28 tablet    Refill:  1  . Oxycodone HCl 10 MG TABS    Sig: Take 1 tablet (10 mg total) by mouth every 6 (six) hours as needed.    Dispense:  40 tablet    Refill:  0  . clotrimazole (LOTRIMIN) 1 % cream    Sig: Apply 1 application topically 2 (two) times daily.    Dispense:  45 g    Refill:  2   Orders Placed This Encounter  Procedures  . WET PREP BY MOLECULAR PROBE  . GC/Chlamydia Probe Amp

## 2013-12-09 LAB — GC/CHLAMYDIA PROBE AMP
CT Probe RNA: NEGATIVE
GC PROBE AMP APTIMA: NEGATIVE

## 2013-12-09 LAB — PAP IG W/ RFLX HPV ASCU

## 2013-12-09 LAB — WET PREP BY MOLECULAR PROBE
Candida species: NEGATIVE
GARDNERELLA VAGINALIS: NEGATIVE
Trichomonas vaginosis: NEGATIVE

## 2014-01-06 ENCOUNTER — Telehealth: Payer: Self-pay | Admitting: *Deleted

## 2014-01-06 NOTE — Telephone Encounter (Signed)
Patient requesting a refill on pain medication. Attempted to contact patient and left message on voicemail for patient to contact the office.

## 2014-01-07 NOTE — Telephone Encounter (Signed)
Pt made aware of recommendations and appt has been scheduled for 01/26/14.

## 2014-01-07 NOTE — Telephone Encounter (Signed)
Refill request denied.  Patient needs appointment to reevaluate problem.

## 2014-01-07 NOTE — Telephone Encounter (Signed)
Please advise 

## 2014-01-21 ENCOUNTER — Ambulatory Visit (INDEPENDENT_AMBULATORY_CARE_PROVIDER_SITE_OTHER): Payer: Medicaid Other | Admitting: Obstetrics

## 2014-01-21 ENCOUNTER — Telehealth: Payer: Self-pay | Admitting: Internal Medicine

## 2014-01-21 VITALS — BP 109/72 | HR 71 | Temp 98.4°F | Wt 131.0 lb

## 2014-01-21 DIAGNOSIS — IMO0002 Reserved for concepts with insufficient information to code with codable children: Secondary | ICD-10-CM

## 2014-01-21 DIAGNOSIS — N941 Unspecified dyspareunia: Secondary | ICD-10-CM

## 2014-01-21 MED ORDER — OXYCODONE HCL 10 MG PO TABS: 10.0000 mg | ORAL_TABLET | Freq: Four times a day (QID) | ORAL | Status: DC | PRN

## 2014-01-21 NOTE — Telephone Encounter (Signed)
Pt states she is still having problems with abdominal pain and bloating. Pt would like to be seen.  Pt scheduled to see Tye Savoy NP Tuesday at 2:30pm. Pt aware.

## 2014-01-22 ENCOUNTER — Encounter: Payer: Self-pay | Admitting: Obstetrics

## 2014-01-22 NOTE — Progress Notes (Signed)
Patient ID: Lydia [name], female   DOB: 23-May-1985, 28 y.o.   MRN: [id number]  Chief Complaint  Patient presents with  . Follow-up    pain management    HPI Lydia [name] is a 28 y.o. female.  Lower abdominal pain.  Evaluated by GI and treated, with subsequent pain relief.  She stopped taking meds and pain has returned. HPI     Past Medical History  Diagnosis Date  . Chronic headaches     none x 2-3 yrs  . Meningitis     Past Surgical History  Procedure Laterality Date  . No past surgeries      Family History  Problem Relation Age of Onset  . Hypertension Mother   . Breast cancer Maternal Aunt     Social History History  Substance Use Topics  . Smoking status: Never Smoker   . Smokeless tobacco: Never Used  . Alcohol Use: No    Allergies  Allergen Reactions  . Penicillins Hives    Current Outpatient Prescriptions  Medication Sig Dispense Refill  . clotrimazole (LOTRIMIN) 1 % cream Apply 1 application topically 2 (two) times daily.  45 g  2  . doxycycline (VIBRAMYCIN) 100 MG capsule Take 1 capsule (100 mg total) by mouth 2 (two) times daily.  28 capsule  1  . Linaclotide (LINZESS) 145 MCG CAPS capsule Take 1 capsule (145 mcg total) by mouth daily.  30 capsule  6  . metroNIDAZOLE (FLAGYL) 500 MG tablet Take 1 tablet (500 mg total) by mouth 2 (two) times daily.  28 tablet  1  . norethindrone-ethinyl estradiol (NECON,BREVICON,MODICON) 0.5-35 MG-MCG tablet Take 1 tablet by mouth daily.  1 Package  3  . Oxycodone HCl 10 MG TABS Take 1 tablet (10 mg total) by mouth every 6 (six) hours as needed.  40 tablet  0  . pantoprazole (PROTONIX) 40 MG tablet Take 1 tablet (40 mg total) by mouth daily.  90 tablet  3  . traMADol (ULTRAM) 50 MG tablet Take 50 mg by mouth every 6 (six) hours as needed for moderate pain.       No current facility-administered medications for this visit.    Review of Systems Review of Systems Constitutional: negative for fatigue and weight  loss Respiratory: negative for cough and wheezing Cardiovascular: negative for chest pain, fatigue and palpitations Gastrointestinal: negative for abdominal pain and change in bowel habits Genitourinary:negative Integument/breast: negative for nipple discharge Musculoskeletal:negative for myalgias Neurological: negative for gait problems and tremors Behavioral/Psych: negative for abusive relationship, depression Endocrine: negative for temperature intolerance     Blood pressure 109/72, pulse 71, temperature 98.4 F (36.9 C), weight 131 lb (59.421 kg), last menstrual period 01/07/2014.  Physical Exam Physical Exam General:   alert  Skin:   no rash or abnormalities  Lungs:   clear to auscultation bilaterally  Heart:   regular rate and rhythm, S1, S2 normal, no murmur, click, rub or gallop  Breasts:   normal without suspicious masses, skin or nipple changes or axillary nodes  Abdomen:  normal findings: no organomegaly, soft, non-tender and no hernia  Pelvis:  External genitalia: normal general appearance Urinary system: urethral meatus normal and bladder without fullness, nontender Vaginal: normal without tenderness, induration or masses Cervix: normal appearance Adnexa: normal bimanual exam Uterus: anteverted and non-tender, normal size     Data Reviewed Labs  Assessment    Pelvic pain, chronic.  Probable GI etiology.      Plan   Will refer  back to GI for continued management.       Orders Placed This Encounter  Procedures  . Ambulatory referral to Gastroenterology    Referral Priority:  Routine    Referral Type:  Consultation    Referral Reason:  Specialty Services Required    Requested Specialty:  Gastroenterology    Number of Visits Requested:  1   Meds ordered this encounter  Medications  . Oxycodone HCl 10 MG TABS    Sig: Take 1 tablet (10 mg total) by mouth every 6 (six) hours as needed.    Dispense:  40 tablet    Refill:  0        HARPER,CHARLES  A 01/22/2014, 6:23 AM

## 2014-01-25 ENCOUNTER — Encounter: Payer: Self-pay | Admitting: Nurse Practitioner

## 2014-01-25 ENCOUNTER — Ambulatory Visit (INDEPENDENT_AMBULATORY_CARE_PROVIDER_SITE_OTHER): Payer: Medicaid Other | Admitting: Nurse Practitioner

## 2014-01-25 VITALS — BP 90/60 | HR 76 | Ht 59.0 in | Wt 131.4 lb

## 2014-01-25 DIAGNOSIS — R140 Abdominal distension (gaseous): Secondary | ICD-10-CM

## 2014-01-25 DIAGNOSIS — R142 Eructation: Secondary | ICD-10-CM

## 2014-01-25 DIAGNOSIS — R141 Gas pain: Secondary | ICD-10-CM

## 2014-01-25 DIAGNOSIS — R143 Flatulence: Secondary | ICD-10-CM

## 2014-01-25 DIAGNOSIS — R1013 Epigastric pain: Secondary | ICD-10-CM

## 2014-01-25 NOTE — Patient Instructions (Signed)

## 2014-01-26 ENCOUNTER — Ambulatory Visit: Payer: Self-pay | Admitting: Obstetrics

## 2014-01-26 ENCOUNTER — Encounter: Payer: Self-pay | Admitting: Nurse Practitioner

## 2014-01-26 DIAGNOSIS — R1013 Epigastric pain: Secondary | ICD-10-CM

## 2014-01-26 HISTORY — DX: Epigastric pain: R10.13

## 2014-01-26 NOTE — Progress Notes (Addendum)
     History of Present Illness:   Patient is a 28 year old female evaluated by Dr. Hilarie Fredrickson late June for epigastric pain, bloating and chronic constipation. H. Pylori antibody and celiac studies normal. Patient was tried on a PPI for presumed dyspepsia and Linzess for constipation. Patient returns to clinic for recurrent epigastric pain and bloating. Bowels are moving well on QOD Linzess. Epigastric pain / bloating transiently responded to PPI. The epigastric pain is postprandial, occuring 5-10 minutes after eating and lasting about 30 minutes. Pain is nonradiating, there's no associated nausea. No unusual weight loss. U/S, LFTs normal in April 2015  Current Medications, Allergies, Past Medical History, Past Surgical History, Family History and Social History were reviewed in [employer] record.  Physical Exam: General: Pleasant, well developed , white female in no acute distress Head: Normocephalic and atraumatic Eyes:  sclerae anicteric, conjunctiva pink  Ears: Normal auditory acuity Lungs: Clear throughout to auscultation Heart: Regular rate and rhythm Abdomen: Soft, non distended, non-tender. No masses, no hepatomegaly. Normal bowel sounds Musculoskeletal: Symmetrical with no gross deformities  Extremities: No edema  Neurological: Alert oriented x 4, grossly nonfocal Psychological:  Alert and cooperative. Normal mood and affect  Assessment and Recommendations:  24.  28 year old female with recurrent postprandial abdominal pain / bloating. Symptoms transiently improved with PPI therapy. For further evaluation patient will be scheduled for EGD. The benefits, risks, and potential complications of EGD with possible biopsies were discussed with the patient and she agrees to proceed. If EGD negative she may need [facility] HIDA   2. Chronic constipation, improved with QOD Linzess.  Addendum: Reviewed and agree with initial management. Jerene Bears,  MD

## 2014-02-02 ENCOUNTER — Other Ambulatory Visit: Payer: Self-pay

## 2014-02-02 ENCOUNTER — Telehealth: Payer: Self-pay

## 2014-02-02 ENCOUNTER — Ambulatory Visit (AMBULATORY_SURGERY_CENTER): Payer: Medicaid Other | Admitting: Internal Medicine

## 2014-02-02 ENCOUNTER — Encounter: Payer: Self-pay | Admitting: Internal Medicine

## 2014-02-02 VITALS — BP 102/62 | HR 67 | Temp 97.9°F | Resp 17 | Ht 64.0 in | Wt 131.0 lb

## 2014-02-02 DIAGNOSIS — R1013 Epigastric pain: Secondary | ICD-10-CM

## 2014-02-02 DIAGNOSIS — R109 Unspecified abdominal pain: Secondary | ICD-10-CM

## 2014-02-02 MED ORDER — SODIUM CHLORIDE 0.9 % IV SOLN: 500.0000 mL | INTRAVENOUS | Status: DC

## 2014-02-02 NOTE — Progress Notes (Signed)
Called to room to assist during endoscopic procedure.  Patient ID and intended procedure confirmed with present staff. Received instructions for my participation in the procedure from the performing physician.  

## 2014-02-02 NOTE — Op Note (Signed)
[facility]  [address] [place], [id number]   ENDOSCOPY PROCEDURE REPORT  PATIENT: Lydia [name], Lydia [name]  MR#: [id number] BIRTHDATE: 1986/02/27 , 28  yrs. old GENDER: female ENDOSCOPIST: Jerene Bears, MD PROCEDURE DATE:  02/02/2014 PROCEDURE:  EGD w/ biopsy ASA CLASS:     Class II INDICATIONS:  epigastric abdominal pain. MEDICATIONS: Monitored anesthesia care and Propofol 200 mg TOPICAL ANESTHETIC: none  DESCRIPTION OF PROCEDURE: After the risks benefits and alternatives of the procedure were thoroughly explained, informed consent was obtained.  The LB [id number] endoscope was introduced through the mouth and advanced to the second portion of the duodenum , limited by Without limitations. The instrument was slowly withdrawn as the mucosa was fully examined.   ESOPHAGUS: The esophagus and gastroesophageal junction were completely normal in appearance.  The stomach was entered and closely examined.The antrum, angularis, and lesser curvature were well visualized, including a retroflexed view of the cardia and fundus.  The stomach wall was normally distensible.  The scope passed easily through the pylorus into the duodenum.  The examined duodenum was unremarkable. Cold forcep biopsies were taken at the gastric body, antrum and angularis to evaluate for h.  pylori. Multiple duodenal biopsies were performed using cold forceps. Sample obtained for evaluation of celiac disease.  Retroflexed views revealed no abnormalities.     The scope was then withdrawn from the patient and the procedure completed.  COMPLICATIONS: There were no complications.  ENDOSCOPIC IMPRESSION: 1.   Normal appearing esophagus and GE junction, the stomach was well visualized and normal in appearance, normal appearing duodenum  2.   H.  pylori biopsies 3.   Multiple biopsies were performed  RECOMMENDATIONS: 1.  Await biopsy results 2.  Schedule HIDA scan with CCK for further evaluation  of upper abdominal pain 3.  Follow-up of helicobacter pylori status, treat if indicated  eSigned:  Jerene Bears, MD 02/02/2014 10:31 AM CC:The Patient and Baltazar Najjar MD

## 2014-02-02 NOTE — Telephone Encounter (Signed)
Pt scheduled for HIDA scan at [facility] 02/11/14@9 :30am. Pt to arrive there at 9:15am. Pt to be NPO after midnight. Left message for pt to call back.

## 2014-02-02 NOTE — Progress Notes (Signed)
Procedure ends, to recovery, report given and VSS. 

## 2014-02-02 NOTE — Patient Instructions (Signed)
YOU HAD AN ENDOSCOPIC PROCEDURE TODAY AT [facility]: Refer to the procedure report that was given to you for any specific questions about what was found during the examination.  If the procedure report does not answer your questions, please call your gastroenterologist to clarify.  If you requested that your care partner not be given the details of your procedure findings, then the procedure report has been included in a sealed envelope for you to review at your convenience later.  YOU SHOULD EXPECT: Some feelings of bloating in the abdomen. Passage of more gas than usual.  Walking can help get rid of the air that was put into your GI tract during the procedure and reduce the bloating.  DIET: Your first meal following the procedure should be a light meal and then it is ok to progress to your normal diet.  A half-sandwich or bowl of soup is an example of a good first meal.  Heavy or fried foods are harder to digest and may make you feel nauseous or bloated.  Likewise meals heavy in dairy and vegetables can cause extra gas to form and this can also increase the bloating.  Drink plenty of fluids but you should avoid alcoholic beverages for 24 hours.  ACTIVITY: Your care partner should take you home directly after the procedure.  You should plan to take it easy, moving slowly for the rest of the day.  You can resume normal activity the day after the procedure however you should NOT DRIVE or use heavy machinery for 24 hours (because of the sedation medicines used during the test).    SYMPTOMS TO REPORT IMMEDIATELY: A gastroenterologist can be reached at any hour.  During normal business hours, 8:30 AM to 5:00 PM Monday through Friday, call [phone].  After hours and on weekends, please call the GI answering service at [phone] who will take a message and have the physician on call contact you.  Following upper endoscopy (EGD)  Vomiting of blood or coffee ground material  New  chest pain or pain under the shoulder blades  Painful or persistently difficult swallowing  New shortness of breath  Fever of 100F or higher  Black, tarry-looking stools  FOLLOW UP: If any biopsies were taken you will be contacted by phone or by letter within the next 1-3 weeks.  Call your gastroenterologist if you have not heard about the biopsies in 3 weeks.  Our staff will call the home number listed on your records the next business day following your procedure to check on you and address any questions or concerns that you may have at that time regarding the information given to you following your procedure. This is a courtesy call and so if there is no answer at the home number and we have not heard from you through the emergency physician on call, we will assume that you have returned to your regular daily activities without incident.  SIGNATURES/CONFIDENTIALITY: You and/or your care partner have signed paperwork which will be entered into your electronic medical record.  These signatures attest to the fact that that the information above on your After Visit Summary has been reviewed and is understood.  Full responsibility of the confidentiality of this discharge information lies with you and/or your care-partner.  Await pathology  Continue your normal medications  Dr. Vena Rua office nurse will call you to set up a HIDA scan

## 2014-02-02 NOTE — Telephone Encounter (Signed)
Spoke with pt and she is aware.

## 2014-02-03 ENCOUNTER — Telehealth: Payer: Self-pay | Admitting: *Deleted

## 2014-02-03 NOTE — Telephone Encounter (Signed)
  Follow up Call-  Call back number 02/02/2014  Post procedure Call Back phone  # [phone]  Permission to leave phone message Yes     Patient questions:  Do you have a fever, pain , or abdominal swelling? No. Pain Score  0 *  Have you tolerated food without any problems? Yes.    Have you been able to return to your normal activities? Yes.    Do you have any questions about your discharge instructions: Diet   No. Medications  No. Follow up visit  No.  Do you have questions or concerns about your Care? No.  Actions: * If pain score is 4 or above: No action needed, pain <4.

## 2014-02-09 ENCOUNTER — Encounter: Payer: Self-pay | Admitting: Internal Medicine

## 2014-02-11 ENCOUNTER — Ambulatory Visit (HOSPITAL_COMMUNITY)
Admission: RE | Admit: 2014-02-11 | Discharge: 2014-02-11 | Disposition: A | Discharge status: Home / Self Care | Payer: Medicaid Other | Source: Ambulatory Visit | Attending: Internal Medicine | Admitting: Internal Medicine

## 2014-02-11 DIAGNOSIS — R1011 Right upper quadrant pain: Secondary | ICD-10-CM | POA: Diagnosis not present

## 2014-02-11 DIAGNOSIS — R109 Unspecified abdominal pain: Secondary | ICD-10-CM

## 2014-02-11 MED ORDER — TECHNETIUM TC 99M MEBROFENIN IV KIT
5.0000 | PACK | Freq: Once | INTRAVENOUS | Status: AC | PRN
  Administered 2014-02-11: 5 via INTRAVENOUS

## 2014-02-11 MED ORDER — SINCALIDE 5 MCG IJ SOLR
0.0200 ug/kg | Freq: Once | INTRAMUSCULAR | Status: AC
  Administered 2014-02-11: 1.2 ug via INTRAVENOUS

## 2014-02-14 ENCOUNTER — Other Ambulatory Visit: Payer: Self-pay

## 2014-02-14 DIAGNOSIS — K828 Other specified diseases of gallbladder: Secondary | ICD-10-CM

## 2014-02-18 ENCOUNTER — Telehealth (INDEPENDENT_AMBULATORY_CARE_PROVIDER_SITE_OTHER): Payer: Self-pay

## 2014-02-18 DIAGNOSIS — R109 Unspecified abdominal pain: Secondary | ICD-10-CM

## 2014-02-18 NOTE — Telephone Encounter (Signed)
Pt seen in office today by Dr Johney Maine and order placed in epic for Gastric Empyting study.

## 2014-02-28 ENCOUNTER — Encounter (HOSPITAL_COMMUNITY)
Admission: RE | Admit: 2014-02-28 | Discharge: 2014-02-28 | Disposition: A | Discharge status: Home / Self Care | Payer: Medicaid Other | Source: Ambulatory Visit | Attending: Surgery | Admitting: Surgery

## 2014-02-28 DIAGNOSIS — R109 Unspecified abdominal pain: Secondary | ICD-10-CM | POA: Diagnosis not present

## 2014-02-28 MED ORDER — TECHNETIUM TC 99M MEBROFENIN IV KIT
5.0000 | PACK | Freq: Once | INTRAVENOUS | Status: AC | PRN
  Administered 2014-02-28: 5 via INTRAVENOUS

## 2014-03-01 ENCOUNTER — Other Ambulatory Visit: Payer: Self-pay | Admitting: Obstetrics & Gynecology

## 2014-03-01 ENCOUNTER — Other Ambulatory Visit (INDEPENDENT_AMBULATORY_CARE_PROVIDER_SITE_OTHER): Payer: Self-pay | Admitting: Surgery

## 2014-03-01 ENCOUNTER — Telehealth: Payer: Self-pay | Admitting: *Deleted

## 2014-03-01 NOTE — Telephone Encounter (Signed)
Patient states she wanted to know if she had a follow up appointment to discuss test results. Advised patient she has an appointment for 03-10-14 and encouraged patient to keep the appointment.

## 2014-03-10 ENCOUNTER — Encounter: Payer: Self-pay | Admitting: Obstetrics

## 2014-03-10 ENCOUNTER — Ambulatory Visit (INDEPENDENT_AMBULATORY_CARE_PROVIDER_SITE_OTHER): Payer: Medicaid Other | Admitting: Obstetrics

## 2014-03-10 VITALS — BP 108/69 | HR 74 | Temp 97.6°F | Ht 59.0 in | Wt 130.0 lb

## 2014-03-10 DIAGNOSIS — J0101 Acute recurrent maxillary sinusitis: Secondary | ICD-10-CM

## 2014-03-10 DIAGNOSIS — N946 Dysmenorrhea, unspecified: Secondary | ICD-10-CM

## 2014-03-10 DIAGNOSIS — N941 Unspecified dyspareunia: Secondary | ICD-10-CM

## 2014-03-10 DIAGNOSIS — R52 Pain, unspecified: Secondary | ICD-10-CM

## 2014-03-10 DIAGNOSIS — Z30011 Encounter for initial prescription of contraceptive pills: Secondary | ICD-10-CM

## 2014-03-10 MED ORDER — NORETHINDRONE-ETH ESTRADIOL 0.5-35 MG-MCG PO TABS: 1.0000 | ORAL_TABLET | Freq: Every day | ORAL | Status: DC

## 2014-03-10 MED ORDER — OXYCODONE HCL 10 MG PO TABS: 10.0000 mg | ORAL_TABLET | Freq: Four times a day (QID) | ORAL | Status: DC | PRN

## 2014-03-10 MED ORDER — IBUPROFEN 800 MG PO TABS: 800.0000 mg | ORAL_TABLET | Freq: Three times a day (TID) | ORAL | Status: DC | PRN

## 2014-03-10 MED ORDER — CLINDAMYCIN HCL 300 MG PO CAPS: 300.0000 mg | ORAL_CAPSULE | Freq: Three times a day (TID) | ORAL | Status: DC

## 2014-03-11 ENCOUNTER — Encounter: Payer: Self-pay | Admitting: Obstetrics

## 2014-03-11 DIAGNOSIS — J0101 Acute recurrent maxillary sinusitis: Secondary | ICD-10-CM | POA: Insufficient documentation

## 2014-03-11 DIAGNOSIS — N941 Unspecified dyspareunia: Secondary | ICD-10-CM

## 2014-03-11 HISTORY — DX: Acute recurrent maxillary sinusitis: J01.01

## 2014-03-11 HISTORY — DX: Unspecified dyspareunia: N94.10

## 2014-03-11 NOTE — Progress Notes (Signed)
Patient ID: Lydia [name], female   DOB: [birth year], 28 y.o.   MRN: [id number]  Chief Complaint  Patient presents with  . Follow-up    HPI Lydia [name] is a 28 y.o. female.  C/O continued pain and was seen by surgery and cholecystectomy recommended.  Still has pain with intercourse.  Also has a sinus infection. HPI  Past Medical History  Diagnosis Date  . Chronic headaches     none x 2-3 yrs  . Meningitis     Past Surgical History  Procedure Laterality Date  . No past surgeries      Family History  Problem Relation Age of Onset  . Hypertension Mother   . Breast cancer Maternal Aunt     Social History History  Substance Use Topics  . Smoking status: Never Smoker   . Smokeless tobacco: Never Used  . Alcohol Use: No    Allergies  Allergen Reactions  . Penicillins Hives    Current Outpatient Prescriptions  Medication Sig Dispense Refill  . Linaclotide (LINZESS) 145 MCG CAPS capsule Take 1 capsule (145 mcg total) by mouth daily.  30 capsule  6  . norethindrone-ethinyl estradiol (NECON,BREVICON,MODICON) 0.5-35 MG-MCG tablet Take 1 tablet by mouth daily.  1 Package  3  . clindamycin (CLEOCIN) 300 MG capsule Take 1 capsule (300 mg total) by mouth 3 (three) times daily.  30 capsule  1  . ibuprofen (ADVIL,MOTRIN) 800 MG tablet Take 1 tablet (800 mg total) by mouth every 8 (eight) hours as needed for mild pain.  30 tablet  5  . Oxycodone HCl 10 MG TABS Take 1 tablet (10 mg total) by mouth every 6 (six) hours as needed.  40 tablet  0  . pantoprazole (PROTONIX) 40 MG tablet Take 1 tablet (40 mg total) by mouth daily.  90 tablet  3   No current facility-administered medications for this visit.    Review of Systems Review of Systems Constitutional: negative for fatigue and weight loss Respiratory: negative for cough and wheezing Cardiovascular: negative for chest pain, fatigue and palpitations Gastrointestinal: negative for abdominal pain and change in bowel  habits Genitourinary: chronic pelvic pain, dyspareunia Integument/breast: negative for nipple discharge Musculoskeletal:negative for myalgias Neurological: negative for gait problems and tremors Behavioral/Psych: negative for abusive relationship, depression Endocrine: negative for temperature intolerance     Blood pressure 108/69, pulse 74, temperature 97.6 F (36.4 C), height 4\' 11"  (1.499 m), weight 130 lb (58.968 kg), last menstrual period 03/07/2014.  Physical Exam Physical Exam General:   alert  Skin:   no rash or abnormalities  Lungs:   clear to auscultation bilaterally  Heart:   regular rate and rhythm, S1, S2 normal, no murmur, click, rub or gallop  Breasts:   normal without suspicious masses, skin or nipple changes or axillary nodes  Abdomen:  normal findings: no organomegaly, soft, non-tender and no hernia  Pelvis:  External genitalia: normal general appearance Urinary system: urethral meatus normal and bladder without fullness, nontender Vaginal: normal without tenderness, induration or masses Cervix: normal appearance Adnexa: normal bimanual exam Uterus: anteverted and non-tender, normal size      Data Reviewed Labs Radiology  Assessment:    Chronic pain  Cholecystitis  Sinusitis    Plan    No orders of the defined types were placed in this encounter.   Meds ordered this encounter  Medications  . Oxycodone HCl 10 MG TABS    Sig: Take 1 tablet (10 mg total) by mouth every 6 (  six) hours as needed.    Dispense:  40 tablet    Refill:  0  . norethindrone-ethinyl estradiol (NECON,BREVICON,MODICON) 0.5-35 MG-MCG tablet    Sig: Take 1 tablet by mouth daily.    Dispense:  1 Package    Refill:  3  . clindamycin (CLEOCIN) 300 MG capsule    Sig: Take 1 capsule (300 mg total) by mouth 3 (three) times daily.    Dispense:  30 capsule    Refill:  1  . ibuprofen (ADVIL,MOTRIN) 800 MG tablet    Sig: Take 1 tablet (800 mg total) by mouth every 8 (eight) hours as  needed for mild pain.    Dispense:  30 tablet    Refill:  5    Need to obtain previous records    Davit Vassar A 03/11/2014, 5:17 AM

## 2014-03-14 ENCOUNTER — Encounter: Payer: Self-pay | Admitting: Obstetrics

## 2014-03-25 ENCOUNTER — Other Ambulatory Visit (INDEPENDENT_AMBULATORY_CARE_PROVIDER_SITE_OTHER): Payer: Self-pay | Admitting: Surgery

## 2014-04-26 ENCOUNTER — Ambulatory Visit: Payer: Medicaid Other | Admitting: Obstetrics

## 2014-04-28 ENCOUNTER — Ambulatory Visit (INDEPENDENT_AMBULATORY_CARE_PROVIDER_SITE_OTHER): Payer: Medicaid Other | Admitting: Obstetrics

## 2014-04-28 ENCOUNTER — Encounter: Payer: Self-pay | Admitting: Obstetrics

## 2014-04-28 VITALS — BP 107/69 | HR 79 | Temp 97.9°F | Ht 59.0 in | Wt 134.0 lb

## 2014-04-28 DIAGNOSIS — R52 Pain, unspecified: Secondary | ICD-10-CM

## 2014-04-28 DIAGNOSIS — N941 Unspecified dyspareunia: Secondary | ICD-10-CM

## 2014-04-28 DIAGNOSIS — R102 Pelvic and perineal pain: Secondary | ICD-10-CM

## 2014-04-28 DIAGNOSIS — N898 Other specified noninflammatory disorders of vagina: Secondary | ICD-10-CM

## 2014-04-28 MED ORDER — OXYCODONE HCL 10 MG PO TABS: 10.0000 mg | ORAL_TABLET | Freq: Four times a day (QID) | ORAL | Status: DC | PRN

## 2014-04-28 NOTE — Progress Notes (Signed)
Patient ID: Lydia [name], female   DOB: 06-Dec-1985, 28 y.o.   MRN: [id number]  Chief Complaint  Patient presents with  . Vaginitis    HPI Lydia [name] is a 28 y.o. female.  Vaginal discharge with itching and odor.  Pain with intercourse.  HPI  Past Medical History  Diagnosis Date  . Chronic headaches     none x 2-3 yrs  . Meningitis     Past Surgical History  Procedure Laterality Date  . Cholecystectomy      Family History  Problem Relation Age of Onset  . Hypertension Mother   . Breast cancer Maternal Aunt     Social History History  Substance Use Topics  . Smoking status: Never Smoker   . Smokeless tobacco: Never Used  . Alcohol Use: No    Allergies  Allergen Reactions  . Penicillins Hives    Current Outpatient Prescriptions  Medication Sig Dispense Refill  . norethindrone-ethinyl estradiol (NECON,BREVICON,MODICON) 0.5-35 MG-MCG tablet Take 1 tablet by mouth daily. 1 Package 3  . Oxycodone HCl 10 MG TABS Take 1 tablet (10 mg total) by mouth every 6 (six) hours as needed. 40 tablet 0   No current facility-administered medications for this visit.    Review of Systems Review of Systems Constitutional: negative for fatigue and weight loss Respiratory: negative for cough and wheezing Cardiovascular: negative for chest pain, fatigue and palpitations Gastrointestinal: negative for abdominal pain and change in bowel habits Genitourinary: positive for vaginal discharge and pelvic pain with intercourse Integument/breast: negative for nipple discharge Musculoskeletal:negative for myalgias Neurological: negative for gait problems and tremors Behavioral/Psych: negative for abusive relationship, depression Endocrine: negative for temperature intolerance     Blood pressure 107/69, pulse 79, temperature 97.9 F (36.6 C), height 4\' 11"  (1.499 m), weight 134 lb (60.782 kg).  Physical Exam Physical Exam General:   alert  Skin:   no rash or abnormalities  Lungs:    clear to auscultation bilaterally  Heart:   regular rate and rhythm, S1, S2 normal, no murmur, click, rub or gallop  Breasts:   normal without suspicious masses, skin or nipple changes or axillary nodes  Abdomen:  normal findings: no organomegaly, soft, non-tender and no hernia  Pelvis:  External genitalia: normal general appearance Urinary system: urethral meatus normal and bladder without fullness, nontender Vaginal: normal without tenderness, induration or masses.  Scant greyish discharge Cervix: normal appearance Adnexa: normal bimanual exam, nontender Uterus: midposition and NSSC.  Tender       Data Reviewed Labs Ultrasounds  Assessment    Pelvic pain, chronic.  Dyspareunia.  Pain seems to be uterine on examination.     Plan    May need referral to [facility] This Encounter  Procedures  . SureSwab, Vaginosis/Vaginitis Plus  . Urine Culture   Meds ordered this encounter  Medications  . Oxycodone HCl 10 MG TABS    Sig: Take 1 tablet (10 mg total) by mouth every 6 (six) hours as needed.    Dispense:  40 tablet    Refill:  0       HARPER,CHARLES A 04/28/2014, 8:40 PM

## 2014-04-30 LAB — URINE CULTURE: Colony Count: 6000

## 2014-05-03 LAB — SURESWAB, VAGINOSIS/VAGINITIS PLUS
Atopobium vaginae: NOT DETECTED Log (cells/mL)
C. albicans, DNA: NOT DETECTED
C. glabrata, DNA: NOT DETECTED
C. parapsilosis, DNA: NOT DETECTED
C. trachomatis RNA, TMA: NOT DETECTED
C. tropicalis, DNA: NOT DETECTED
GARDNERELLA VAGINALIS: NOT DETECTED Log (cells/mL)
MEGASPHAERA SPECIES: NOT DETECTED Log (cells/mL)
N. gonorrhoeae RNA, TMA: NOT DETECTED
T. VAGINALIS RNA, QL TMA: NOT DETECTED

## 2014-05-09 ENCOUNTER — Encounter: Payer: Self-pay | Admitting: *Deleted

## 2014-05-10 ENCOUNTER — Encounter: Payer: Self-pay | Admitting: Obstetrics & Gynecology

## 2014-05-13 HISTORY — PX: CHOLECYSTECTOMY: SHX55

## 2014-07-03 ENCOUNTER — Other Ambulatory Visit: Payer: Self-pay | Admitting: Obstetrics

## 2014-07-04 ENCOUNTER — Telehealth: Payer: Self-pay

## 2014-07-04 ENCOUNTER — Telehealth: Payer: Self-pay | Admitting: *Deleted

## 2014-07-04 ENCOUNTER — Other Ambulatory Visit: Payer: Self-pay | Admitting: Obstetrics

## 2014-07-04 DIAGNOSIS — G8929 Other chronic pain: Secondary | ICD-10-CM

## 2014-07-04 DIAGNOSIS — R102 Pelvic and perineal pain: Principal | ICD-10-CM

## 2014-07-04 MED ORDER — OXYCODONE HCL 10 MG PO TABS: 10.0000 mg | ORAL_TABLET | Freq: Four times a day (QID) | ORAL | Status: DC | PRN

## 2014-07-04 NOTE — Telephone Encounter (Signed)
patient has appt with Dr. Maryland Pink on 07/13/14 at 11am in the [facility] office - gave patient appt time and date and their phone number at [phone]

## 2014-07-04 NOTE — Telephone Encounter (Signed)
patient has appt with Dr. Maryland Pink on 07/13/14 at 11am in the [facility] office - gave patient appt time and date and their phone number at [phone]

## 2014-07-04 NOTE — Telephone Encounter (Signed)
10:30 Patient called to let Dr Jodi Mourning know that she is no better with her pain and would like to go ahead with the referral. Patient was in the office 1 month ago and she states that Dr Jodi Mourning told her if she had not improved to call back. All testing is negative for problem and she is ready to see a specialist. OK to refer per Dr Jodi Mourning.

## 2014-07-05 ENCOUNTER — Telehealth: Payer: Self-pay

## 2014-07-05 NOTE — Telephone Encounter (Signed)
PATIENT STATED SHE LOST PHONE NUMBER TO DR Barnetta Chapel [facility] IN [place] AND COULD NOT FIND ADDRESS - CALLED THEM AND GAVE HER THEIR ADDRESS AND PHONE NUMBER AGAIN - [address], [place] [phone] PHONE [phone] - HER APPT IS 07/13/14 AT 11AM - TOLD HER THEY WOULD REMIND HER AND MOST LIKELY SEND NEW PATIENT PACKET

## 2014-07-27 ENCOUNTER — Other Ambulatory Visit: Payer: Self-pay | Admitting: *Deleted

## 2014-07-27 DIAGNOSIS — Z789 Other specified health status: Secondary | ICD-10-CM

## 2014-07-27 MED ORDER — OB COMPLETE GOLD 27.5-1-200 MG PO CAPS: 1.0000 | ORAL_CAPSULE | Freq: Every day | ORAL | Status: DC

## 2014-07-27 NOTE — Progress Notes (Signed)
Patient contacted the office requesting a prescription for prenatal vitamins. Patient states she is trying to conceive. Per nursing protocol prescription sent to the pharmacy and patient made aware.

## 2014-07-30 ENCOUNTER — Encounter (HOSPITAL_COMMUNITY): Payer: Self-pay | Admitting: *Deleted

## 2014-07-30 ENCOUNTER — Inpatient Hospital Stay (HOSPITAL_COMMUNITY)
Admission: AD | Admit: 2014-07-30 | Discharge: 2014-07-30 | Disposition: A | Discharge status: Home / Self Care | Payer: Medicaid Other | Source: Ambulatory Visit | Attending: Obstetrics | Admitting: Obstetrics

## 2014-07-30 DIAGNOSIS — Z3202 Encounter for pregnancy test, result negative: Secondary | ICD-10-CM | POA: Insufficient documentation

## 2014-07-30 DIAGNOSIS — R51 Headache: Secondary | ICD-10-CM | POA: Diagnosis not present

## 2014-07-30 DIAGNOSIS — M545 Low back pain, unspecified: Secondary | ICD-10-CM

## 2014-07-30 DIAGNOSIS — R109 Unspecified abdominal pain: Secondary | ICD-10-CM | POA: Diagnosis present

## 2014-07-30 LAB — URINALYSIS, ROUTINE W REFLEX MICROSCOPIC
Bilirubin Urine: NEGATIVE
Glucose, UA: NEGATIVE mg/dL
KETONES UR: 15 mg/dL — AB
LEUKOCYTES UA: NEGATIVE
Nitrite: NEGATIVE
PH: 6 (ref 5.0–8.0)
Protein, ur: NEGATIVE mg/dL
SPECIFIC GRAVITY, URINE: 1.01 (ref 1.005–1.030)
Urobilinogen, UA: 1 mg/dL (ref 0.0–1.0)

## 2014-07-30 LAB — URINE MICROSCOPIC-ADD ON

## 2014-07-30 LAB — POCT PREGNANCY, URINE: PREG TEST UR: NEGATIVE

## 2014-07-30 MED ORDER — ACETAMINOPHEN 500 MG PO TABS
1000.0000 mg | ORAL_TABLET | ORAL | Status: AC
Start: 2014-07-30 — End: 2014-07-30
  Administered 2014-07-30: 1000 mg via ORAL
  Filled 2014-07-30: qty 2

## 2014-07-30 MED ORDER — CYCLOBENZAPRINE HCL 10 MG PO TABS: 10.0000 mg | ORAL_TABLET | Freq: Once | ORAL | Status: DC

## 2014-07-30 MED ORDER — CYCLOBENZAPRINE HCL 10 MG PO TABS
10.0000 mg | ORAL_TABLET | Freq: Once | ORAL | Status: AC
  Administered 2014-07-30: 10 mg via ORAL
  Filled 2014-07-30: qty 1

## 2014-07-30 NOTE — MAU Provider Note (Signed)
History     CSN: [id number]  Arrival date and time: 07/30/14 2007   First Provider Initiated Contact with Patient 07/30/14 2225      Chief Complaint  Patient presents with  . Abdominal Pain  . Back Pain   HPI Lydia [name] 29 y.o. [id number] nonpregnant female presents to MAU complaining of back pain that started within the last week but has been constant since yesterday.  She has used 325mg  of tylenol one time for this.  She denies n/v/weakness/numbness/tingling/vaginal bleeding or discharge.  She has been having headache.  She declines STD testing.   OB History    Gravida Para Term Preterm AB TAB SAB Ectopic Multiple Living   3 2 2  0 1 0 1 0 0 2      Past Medical History  Diagnosis Date  . Chronic headaches     none x 2-3 yrs  . Meningitis     Past Surgical History  Procedure Laterality Date  . Cholecystectomy      Family History  Problem Relation Age of Onset  . Hypertension Mother   . Breast cancer Maternal Aunt     History  Substance Use Topics  . Smoking status: Never Smoker   . Smokeless tobacco: Never Used  . Alcohol Use: No    Allergies:  Allergies  Allergen Reactions  . Penicillins Hives    Prescriptions prior to admission  Medication Sig Dispense Refill Last Dose  . Prenat w/o A-FeCbn-Meth-FA-DHA (OB COMPLETE GOLD) 27.5-1-200 MG CAPS Take 1 capsule by mouth daily. 30 capsule 11 07/29/2014 at Unknown time  . psyllium (METAMUCIL) 58.6 % powder Take 1 packet by mouth 3 (three) times daily.   Past Week at Unknown time  . NECON 0.5/35, 28, 0.5-35 MG-MCG tablet TAKE 1 TABLET BY MOUTH DAILY 28 tablet 0   . Oxycodone HCl 10 MG TABS Take 1 tablet (10 mg total) by mouth every 6 (six) hours as needed. 40 tablet 0     ROS Pertinent ROS in HPI  Physical Exam   Blood pressure 106/73, pulse 120, temperature 99.6 F (37.6 C), resp. rate 18, height 5' (1.524 m), weight 131 lb 9.6 oz (59.693 kg), last menstrual period 06/26/2014, SpO2 99 %.  Physical Exam   Constitutional: She is oriented to person, place, and time. She appears well-developed and well-nourished.  HENT:  Head: Normocephalic and atraumatic.  Eyes: EOM are normal.  Neck: Normal range of motion.  Cardiovascular: Normal rate and regular rhythm.   Respiratory: Effort normal and breath sounds normal. No respiratory distress.  GI: Soft. Bowel sounds are normal. She exhibits no distension. There is no tenderness. There is no rebound and no guarding.  Musculoskeletal: Normal range of motion.  Tender to palpation at lumbar spine with paraspinous muscles.  Knot palpated on left.    Neurological: She is alert and oriented to person, place, and time.  Skin: Skin is warm and dry.  Psychiatric: She has a normal mood and affect.    MAU Course  Procedures  MDM Negative UPT No sensory or motor deficits.   Seeking pregnancy. No evidence of UTI  Assessment and Plan  A:  1. Bilateral low back pain without sciatica     P: Discharge to home OTC Tylenol PRN Flexeril PRN - sedation precautions given Ice/Heat PRN Follow up with Dr. Jodi Mourning as needed Patient may return to MAU as needed or if her condition were to change or worsen   Paticia Stack 07/30/2014, 10:25  PM  

## 2014-07-30 NOTE — Progress Notes (Signed)
Written and verbal d/c instructions given and understanding voiced. Waiting for husband to come.

## 2014-07-30 NOTE — MAU Note (Addendum)
Having abd cramps for past wk. No bleeding or d/c. Lower back and lower abd hurts. Tylenol and ibuprofen do not help. Stopped BCPs 3-4wks ago to try to get pregnant

## 2014-07-30 NOTE — Discharge Instructions (Signed)
Back Pain, Adult Low back pain is very common. About 1 in 5 people have back pain.The cause of low back pain is rarely dangerous. The pain often gets better over time.About half of people with a sudden onset of back pain feel better in just 2 weeks. About 8 in 10 people feel better by 6 weeks.  CAUSES Some common causes of back pain include:  Strain of the muscles or ligaments supporting the spine.  Wear and tear (degeneration) of the spinal discs.  Arthritis.  Direct injury to the back. DIAGNOSIS Most of the time, the direct cause of low back pain is not known.However, back pain can be treated effectively even when the exact cause of the pain is unknown.Answering your caregiver's questions about your overall health and symptoms is one of the most accurate ways to make sure the cause of your pain is not dangerous. If your caregiver needs more information, he or she may order lab work or imaging tests (X-rays or MRIs).However, even if imaging tests show changes in your back, this usually does not require surgery. HOME CARE INSTRUCTIONS For many people, back pain returns.Since low back pain is rarely dangerous, it is often a condition that people can learn to manageon their own.   Remain active. It is stressful on the back to sit or stand in one place. Do not sit, drive, or stand in one place for more than 30 minutes at a time. Take short walks on level surfaces as soon as pain allows.Try to increase the length of time you walk each day.  Do not stay in bed.Resting more than 1 or 2 days can delay your recovery.  Do not avoid exercise or work.Your body is made to move.It is not dangerous to be active, even though your back may hurt.Your back will likely heal faster if you return to being active before your pain is gone.  Pay attention to your body when you bend and lift. Many people have less discomfortwhen lifting if they bend their knees, keep the load close to their bodies,and  avoid twisting. Often, the most comfortable positions are those that put less stress on your recovering back.  Find a comfortable position to sleep. Use a firm mattress and lie on your side with your knees slightly bent. If you lie on your back, put a pillow under your knees.  Only take over-the-counter or prescription medicines as directed by your caregiver. Over-the-counter medicines to reduce pain and inflammation are often the most helpful.Your caregiver may prescribe muscle relaxant drugs.These medicines help dull your pain so you can more quickly return to your normal activities and healthy exercise.  Put ice on the injured area.  Put ice in a plastic bag.  Place a towel between your skin and the bag.  Leave the ice on for 15-20 minutes, 03-04 times a day for the first 2 to 3 days. After that, ice and heat may be alternated to reduce pain and spasms.  Ask your caregiver about trying back exercises and gentle massage. This may be of some benefit.  Avoid feeling anxious or stressed.Stress increases muscle tension and can worsen back pain.It is important to recognize when you are anxious or stressed and learn ways to manage it.Exercise is a great option. SEEK MEDICAL CARE IF:  You have pain that is not relieved with rest or medicine.  You have pain that does not improve in 1 week.  You have new symptoms.  You are generally not feeling well. SEEK   IMMEDIATE MEDICAL CARE IF:   You have pain that radiates from your back into your legs.  You develop new bowel or bladder control problems.  You have unusual weakness or numbness in your arms or legs.  You develop nausea or vomiting.  You develop abdominal pain.  You feel faint. Document Released: 04/29/2005 Document Revised: 10/29/2011 Document Reviewed: 08/31/2013 [employer] Patient Information 2015 [employer]. This information is not intended to replace advice given to you by your health care provider. Make sure you  discuss any questions you have with your health care provider.  

## 2014-08-04 ENCOUNTER — Inpatient Hospital Stay (HOSPITAL_COMMUNITY)
Admission: AD | Admit: 2014-08-04 | Discharge: 2014-08-04 | Disposition: A | Discharge status: Home / Self Care | Payer: Medicaid Other | Source: Ambulatory Visit | Attending: Obstetrics | Admitting: Obstetrics

## 2014-08-04 ENCOUNTER — Encounter (HOSPITAL_COMMUNITY): Payer: Self-pay | Admitting: *Deleted

## 2014-08-04 DIAGNOSIS — R102 Pelvic and perineal pain: Secondary | ICD-10-CM | POA: Diagnosis present

## 2014-08-04 DIAGNOSIS — N941 Unspecified dyspareunia: Secondary | ICD-10-CM

## 2014-08-04 LAB — URINALYSIS, ROUTINE W REFLEX MICROSCOPIC
Bilirubin Urine: NEGATIVE
GLUCOSE, UA: NEGATIVE mg/dL
KETONES UR: NEGATIVE mg/dL
Leukocytes, UA: NEGATIVE
Nitrite: NEGATIVE
PH: 7 (ref 5.0–8.0)
PROTEIN: NEGATIVE mg/dL
Specific Gravity, Urine: 1.015 (ref 1.005–1.030)
Urobilinogen, UA: 0.2 mg/dL (ref 0.0–1.0)

## 2014-08-04 LAB — POCT PREGNANCY, URINE
PREG TEST UR: NEGATIVE
Preg Test, Ur: NEGATIVE

## 2014-08-04 LAB — WET PREP, GENITAL
CLUE CELLS WET PREP: NONE SEEN
Trich, Wet Prep: NONE SEEN
YEAST WET PREP: NONE SEEN

## 2014-08-04 LAB — URINE MICROSCOPIC-ADD ON

## 2014-08-04 LAB — GC/CHLAMYDIA PROBE AMP (~~LOC~~) NOT AT ARMC
CHLAMYDIA, DNA PROBE: NEGATIVE
Neisseria Gonorrhea: NEGATIVE

## 2014-08-04 MED ORDER — KETOROLAC TROMETHAMINE 60 MG/2ML IM SOLN
60.0000 mg | Freq: Once | INTRAMUSCULAR | Status: AC
  Administered 2014-08-04: 60 mg via INTRAMUSCULAR
  Filled 2014-08-04: qty 2

## 2014-08-04 NOTE — MAU Provider Note (Signed)
History     CSN: [id number]  Arrival date and time: 08/04/14 2376   First Provider Initiated Contact with Patient 08/04/14 [phone]      Chief Complaint  Patient presents with  . Abdominal Pain   HPI Lydia [name] 29 y.o. [id number] nonpregnant female presents to MAU complaining of pelvic pain.  It started at 5:40am when she was trying to have sex.  She had acute onset of exquisite sharp pain, 9/10.  It is all across pelvis/lower abdomen but slightly worse on left.  She has not used any medications.  She has had some pink discharge and expects her menses to start.  She came off OCP just over a month ago and has not resumed regular menses.  She denies nausea, headache, SOB, dysuria, fever, weakness.   OB History    Gravida Para Term Preterm AB TAB SAB Ectopic Multiple Living   3 2 2  0 1 0 1 0 0 2      Past Medical History  Diagnosis Date  . Chronic headaches     none x 2-3 yrs  . Meningitis     Past Surgical History  Procedure Laterality Date  . Cholecystectomy      Family History  Problem Relation Age of Onset  . Hypertension Mother   . Breast cancer Maternal Aunt     History  Substance Use Topics  . Smoking status: Never Smoker   . Smokeless tobacco: Never Used  . Alcohol Use: No    Allergies:  Allergies  Allergen Reactions  . Penicillins Hives    Prescriptions prior to admission  Medication Sig Dispense Refill Last Dose  . cyclobenzaprine (FLEXERIL) 10 MG tablet Take 1 tablet (10 mg total) by mouth once. 20 tablet 0   . Prenat w/o A-FeCbn-Meth-FA-DHA (OB COMPLETE GOLD) 27.5-1-200 MG CAPS Take 1 capsule by mouth daily. 30 capsule 11 07/29/2014 at Unknown time  . psyllium (METAMUCIL) 58.6 % powder Take 1 packet by mouth 3 (three) times daily.   Past Week at Unknown time    ROS Pertinent ROS in HPI  Physical Exam   Blood pressure 104/63, pulse 84, temperature 98.1 F (36.7 C), temperature source Oral, resp. rate 16, height 5' (1.524 m), weight 129 lb 8 oz  (58.741 kg), last menstrual period 06/26/2014, SpO2 100 %.  Physical Exam  Constitutional: She is oriented to person, place, and time. She appears well-developed and well-nourished. No distress.  HENT:  Head: Normocephalic and atraumatic.  Eyes: EOM are normal.  Neck: Normal range of motion.  Cardiovascular: Normal rate, regular rhythm and normal heart sounds.   Respiratory: Effort normal and breath sounds normal. No respiratory distress.  GI: Soft. Bowel sounds are normal. She exhibits no distension and no mass. There is tenderness. There is no rebound and no guarding.  Slightly TTP on llq  Genitourinary:  Small amt of pink discharge from cervix.   Uncomfortable with exam.  Adnexal tenderness L>R  Musculoskeletal: Normal range of motion.  Neurological: She is alert and oriented to person, place, and time.  Skin: Skin is warm and dry.  Psychiatric: She has a normal mood and affect.    MAU Course  Procedures  MDM Toradol IM given.  Pt notes significant improvement.  VSS.  No sign of infection.  Likely beginning menses  Assessment and Plan  A:  1. Dyspareunia, female      P: Discharge to home Pt notes she has rx ibuprofen at home already.  Use this for  discomfort.  F/u in clinic with Dr. Jodi Mourning. Patient may return to MAU as needed or if her condition were to change or worsen   Paticia Stack 08/04/2014, 8:15 AM

## 2014-08-04 NOTE — MAU Note (Signed)
Had lower abd pain when tried to have sex this morning then started to have spotting.  No birth control.

## 2014-08-04 NOTE — Discharge Instructions (Signed)
Dyspareunia Dyspareunia is pain during sexual intercourse. It is most common in women, but it also happens in men.  CAUSES  Female The pain from this condition is usually felt when anything is put into the vagina, but any part of the genitals may cause pain during sex. Even sitting or wearing pants can cause pain. Sometimes, a cause cannot be found. Some causes of pain during intercourse are:  Infections of the skin around the vagina.  Vaginal infections, such as a yeast, bacterial, or viral infection.  Vaginismus. This is the inability to have anything put in the vagina even when the woman wants it to happen. There is an automatic muscle contraction and pain. The pain of the muscle contraction can be so severe that intercourse is impossible.  Allergic reaction from spermicides, semen, condoms, scented tampons, soaps, douches, and vaginal sprays.  A fluid-filled sac (cyst) on the Bartholin or Skene glands, located at the opening of the vagina.  Scar tissue in the vagina from a surgically enlarged opening (episiotomy) or tearing after delivering a baby.  Vaginal dryness. This is more common in menopause. The normal secretions of the vagina are decreased. Changes in estrogen levels and increased difficulty becoming aroused can cause painful sex. Vaginal dryness can also happen when taking birth control pills.  Thinning of the tissue (atrophy) of the vulva and vagina. This makes the area thinner, smaller, unable to stretch to accommodate a penis, and prone to infection and tearing.  Vulvar vestibulitis or vestibulodynia.This is a condition that causes pain involving the area around the entrance to the vagina.The most common cause in young women is birth control pills.Women with low estrogen levels (postmenopausal women) may also experience this.Other causes include allergic reactions, too many nerve endings, skin conditions, and pelvic muscles that cannot relax.  Vulvar dermatoses. This  includes skin conditions such as lichen sclerosus and lichen planus.  Lack of foreplay to lubricate the vagina. This can cause vaginal dryness.  Noncancerous tumors (fibroids) in the uterus.  Uterus lining tissue growing outside the uterus (endometriosis).  Pregnancy that starts in the fallopian tube (tubal pregnancy).  Pregnancy or breastfeeding your baby. This can cause vaginal dryness.  A tilting or prolapse of the uterus. Prolapse is when weak and stretched muscles around the uterus allow it to fall into the vagina.  Problems with the ovaries, cysts, or scar tissue. This may be worse with certain sexual positions.  Previous surgeries causing adhesions or scar tissue in the vagina or pelvis.  Bladder and intestinal problems.  Psychological problems (such as depression or anxiety). This may make pain worse.  Negative attitudes about sex, experiencing rape, sexual assault, and misinformation about sex. These issues are often related to some types of pain.  Previous pelvic infection, causing scar tissue in the pelvis and on the female organs.  Cyst or tumor on the ovary.  Cancer of the female organs.  Certain medicines.  Medical problems such as diabetes, arthritis, or thyroid disease. Female In men, there are many physical causes of sexual discomfort. Some causes of pain during intercourse are:  Infections of the prostate, bladder, or seminal vesicles. This can cause pain after ejaculation.  An inflamed bladder (interstitial cystitis). This may cause pain from ejaculation.  Gonorrheal infections. This may cause pain during ejaculation.  An inflamed urethra (urethritis) or inflamed prostate (prostatitis). This can make genital stimulation painful or uncomfortable.  Deformities of the penis, such as Peyronie's disease.  A tight foreskin.  Cancer of the female organs.  Psychological problems. This may make pain worse. DIAGNOSIS   Your caregiver will take a history and  have you describe where the pain is located (outside the vagina, in the vagina, in the pelvis). You may be asked when you experience pain, such as with penetration or with thrusting.  Following this, your caregiver will do a physical exam. Let your caregiver know if the exam is too painful.  During the final part of the female exam, your caregiver will feel your uterus and ovaries with one hand on the abdomen and one finger in your vagina. This is a pelvic exam.  Blood tests, a Pap test, cultures for infection, an ultrasound test, and X-rays may be done. You may need to see a specialist for female problems (gynecologist).  Your caregiver may do a CT scan, MRI, or laparoscopy. Laparoscopy is a procedure to look into the pelvis with a lighted tube, through a cut (incision) in the abdomen. TREATMENT  Your caregiver can help you determine the best course of treatment. Sometimes, more testing is done. Continue with the suggested testing until your caregiver feels sure about your diagnosis and how to treat it. Sometimes, it is difficult to find the reason for the pain. The search for the cause and treatment can be frustrating. Treatment often takes several weeks to a few months before you notice any improvement. You may also need to avoid sexual activity until symptoms improve.Continuing to have sex when it hurts can delay healing and actually make the problem worse. The treatment depends on the cause of the pain. Treatment may include:  Medicines such as antibiotics, vaginal or skin creams, hormones, or antidepressants.  Minor or major surgery.  Psychological counseling or group therapy.  Kegel exercises and vaginal dilators to help certain cases of vaginismus (spasms). Do this only if recommended by your caregiver.Kegel exercises can make some problems worse.  Applying lubrication as recommended by your caregiver if you have dryness.  Sex therapy for you and your sex partner. It is common for  the pain to continue after the reason for the pain has been treated. Some reasons for this include a conditioned response. This means the person having the pain becomes so familiar with the pain that the pain continues as a response, even though the cause is removed. Sex therapy can help with this problem. HOME CARE INSTRUCTIONS   Follow your caregiver's instructions about taking medicines, tests, counseling, and follow-up treatment.  Do not use scented tampons, douches, vaginal sprays, or soaps.  Use water-based lubricants for dryness. Oil lubricants can cause irritation.  Do not use spermicides or condoms that irritate you.  Openly discuss with your partner your sexual experience, your desires, foreplay, and different sexual positions for a more comfortable and enjoyable sexual relationship.  Join group sessions for therapy, if needed.  Practice safe sex at all times.  Empty your bladder before having intercourse.  Try different positions during sexual intercourse.  Take over-the-counter pain medicine recommended by your caregiver before having sexual intercourse.  Do not wear pantyhose. Knee-high and thigh-high hose are okay.  Avoid scrubbing your vulva with a washcloth. Wash the area gently and pat dry with a towel. SEEK MEDICAL CARE IF:   You develop vaginal bleeding after sexual intercourse.  You develop a lump at the opening of your vagina, even if it is not painful.  You have abnormal vaginal discharge.  You have vaginal dryness.  You have itching or irritation of the vulva or vagina.  You  develop a rash or reaction to your medicine. SEEK IMMEDIATE MEDICAL CARE IF:   You develop severe abdominal pain during or shortly after sexual intercourse. You could have a ruptured ovarian cyst or ruptured tubal pregnancy.  You have a fever.  You have painful or bloody urination.  You have painful sexual intercourse, and you never had it before.  You pass out after having  sexual intercourse. Document Released: 05/19/2007 Document Revised: 07/22/2011 Document Reviewed: 07/30/2010 [facility] Patient Information 2015 [place], [place]. This information is not intended to replace advice given to you by your health care provider. Make sure you discuss any questions you have with your health care provider.

## 2014-09-01 ENCOUNTER — Encounter: Payer: Self-pay | Admitting: Obstetrics

## 2014-09-01 ENCOUNTER — Ambulatory Visit (INDEPENDENT_AMBULATORY_CARE_PROVIDER_SITE_OTHER): Payer: Medicaid Other | Admitting: Obstetrics

## 2014-09-01 VITALS — BP 108/68 | HR 74 | Temp 98.1°F | Wt 131.0 lb

## 2014-09-01 DIAGNOSIS — B373 Candidiasis of vulva and vagina: Secondary | ICD-10-CM

## 2014-09-01 DIAGNOSIS — B3731 Acute candidiasis of vulva and vagina: Secondary | ICD-10-CM

## 2014-09-01 DIAGNOSIS — N946 Dysmenorrhea, unspecified: Secondary | ICD-10-CM | POA: Diagnosis not present

## 2014-09-01 MED ORDER — OXYCODONE-ACETAMINOPHEN 10-325 MG PO TABS: 1.0000 | ORAL_TABLET | Freq: Four times a day (QID) | ORAL | Status: DC | PRN

## 2014-09-01 NOTE — Addendum Note (Signed)
Addended by: Lewie Loron D on: 09/01/2014 04:15 PM   Modules accepted: Orders

## 2014-09-01 NOTE — Progress Notes (Addendum)
Patient ID: Lydia [name], female   DOB: [birth year], 29 y.o.   MRN: [id number]  Chief Complaint  Patient presents with  . Vaginitis    HPI [name] is a 29 y.o. female.  Itching of vulva.  Continued painful periods.  Seen for referral by Dr. Zigmund Daniel for chronic pelvic pain and PT recommended, but insurance doesn't cover it, and patient can't afford it.  She has a F/U appt. With Dr. Zigmund Daniel soon. HPI  Past Medical History  Diagnosis Date  . Chronic headaches     none x 2-3 yrs  . Meningitis     Past Surgical History  Procedure Laterality Date  . Cholecystectomy      Family History  Problem Relation Age of Onset  . Hypertension Mother   . Breast cancer Maternal Aunt     Social History History  Substance Use Topics  . Smoking status: Never Smoker   . Smokeless tobacco: Never Used  . Alcohol Use: No    Allergies  Allergen Reactions  . Penicillins Hives    Current Outpatient Prescriptions  Medication Sig Dispense Refill  . Prenat w/o A-FeCbn-Meth-FA-DHA (OB COMPLETE GOLD) 27.5-1-200 MG CAPS Take 1 capsule by mouth daily. 30 capsule 11  . acetaminophen (TYLENOL) 325 MG tablet Take 650 mg by mouth every 6 (six) hours as needed for headache.    . cyclobenzaprine (FLEXERIL) 10 MG tablet Take 1 tablet (10 mg total) by mouth once. (Patient not taking: Reported on 09/01/2014) 20 tablet 0  . oxyCODONE-acetaminophen (PERCOCET) 10-325 MG per tablet Take 1 tablet by mouth every 6 (six) hours as needed for pain. 40 tablet 0  . psyllium (METAMUCIL) 58.6 % powder Take 1 packet by mouth 3 (three) times daily.     No current facility-administered medications for this visit.    Review of Systems Review of Systems Constitutional: negative for fatigue and weight loss Respiratory: negative for cough and wheezing Cardiovascular: negative for chest pain, fatigue and palpitations Gastrointestinal: negative for abdominal pain and change in bowel habits Genitourinary: itching of  vulva.  Painful periods. Integument/breast: negative for nipple discharge Musculoskeletal:negative for myalgias Neurological: negative for gait problems and tremors Behavioral/Psych: negative for abusive relationship, depression Endocrine: negative for temperature intolerance     Blood pressure 108/68, pulse 74, temperature 98.1 F (36.7 C), weight 131 lb (59.421 kg), last menstrual period 08/11/2014.  Physical Exam Physical Exam                Abdomen:  normal findings: no organomegaly, soft, non-tender and no hernia  Pelvis:  External genitalia: normal general appearance Urinary system: urethral meatus normal and bladder without fullness, nontender Vaginal: normal without tenderness, induration or masses Cervix: normal appearance Adnexa: normal bimanual exam Uterus: anteverted and non-tender, normal size    Data Reviewed Labs Last pap smear  Assessment     Vulvitis  Dysmenorrhea    Plan    Clotrimazole cream Rx Oxycodone Rx   Orders Placed This Encounter  Procedures  . SureSwab, Vaginosis/Vaginitis Plus   Meds ordered this encounter  Medications  . oxyCODONE-acetaminophen (PERCOCET) 10-325 MG per tablet    Sig: Take 1 tablet by mouth every 6 (six) hours as needed for pain.    Dispense:  40 tablet    Refill:  0

## 2014-09-05 ENCOUNTER — Other Ambulatory Visit: Payer: Self-pay | Admitting: Obstetrics

## 2014-09-05 DIAGNOSIS — B373 Candidiasis of vulva and vagina: Secondary | ICD-10-CM

## 2014-09-05 DIAGNOSIS — B3731 Acute candidiasis of vulva and vagina: Secondary | ICD-10-CM

## 2014-09-05 DIAGNOSIS — N946 Dysmenorrhea, unspecified: Secondary | ICD-10-CM

## 2014-09-05 LAB — SURESWAB, VAGINOSIS/VAGINITIS PLUS
ATOPOBIUM VAGINAE: NOT DETECTED Log (cells/mL)
C. ALBICANS, DNA: NOT DETECTED
C. TROPICALIS, DNA: NOT DETECTED
C. glabrata, DNA: NOT DETECTED
C. parapsilosis, DNA: NOT DETECTED
C. trachomatis RNA, TMA: NOT DETECTED
Gardnerella vaginalis: NOT DETECTED Log (cells/mL)
LACTOBACILLUS SPECIES: 7.9 Log (cells/mL)
MEGASPHAERA SPECIES: NOT DETECTED Log (cells/mL)
N. gonorrhoeae RNA, TMA: NOT DETECTED
T. vaginalis RNA, QL TMA: NOT DETECTED

## 2014-09-05 MED ORDER — OXYCODONE HCL 10 MG PO TABS: 10.0000 mg | ORAL_TABLET | Freq: Four times a day (QID) | ORAL | Status: DC | PRN

## 2014-09-05 MED ORDER — CICLOPIROX OLAMINE 0.77 % EX CREA
TOPICAL_CREAM | Freq: Two times a day (BID) | CUTANEOUS | Status: DC
Start: 2014-09-05 — End: 2014-11-22

## 2014-10-12 ENCOUNTER — Other Ambulatory Visit: Payer: Self-pay | Admitting: Obstetrics

## 2014-10-12 ENCOUNTER — Telehealth: Payer: Self-pay | Admitting: *Deleted

## 2014-10-12 DIAGNOSIS — N946 Dysmenorrhea, unspecified: Secondary | ICD-10-CM

## 2014-10-12 MED ORDER — OXYCODONE HCL 10 MG PO TABS: 10.0000 mg | ORAL_TABLET | Freq: Four times a day (QID) | ORAL | Status: DC | PRN

## 2014-10-12 NOTE — Telephone Encounter (Signed)
Patient would like to talk to Dr Jodi Mourning about her pain. 11:09 Call to patient- let her know Dr Jodi Mourning is presently in surgery- her message has been forwarded to him.

## 2014-11-10 ENCOUNTER — Other Ambulatory Visit: Payer: Self-pay | Admitting: Internal Medicine

## 2014-11-17 ENCOUNTER — Telehealth: Payer: Self-pay | Admitting: *Deleted

## 2014-11-17 DIAGNOSIS — B379 Candidiasis, unspecified: Secondary | ICD-10-CM

## 2014-11-17 MED ORDER — FLUCONAZOLE 150 MG PO TABS: 150.0000 mg | ORAL_TABLET | ORAL | Status: DC

## 2014-11-17 NOTE — Telephone Encounter (Signed)
Patient is requesting something for itching. 5:37 Call to patient- she is having itching with discharge. Rx for Diflucan called to pharmacy per Dr Jodi Mourning permission.

## 2014-11-22 ENCOUNTER — Ambulatory Visit (INDEPENDENT_AMBULATORY_CARE_PROVIDER_SITE_OTHER): Payer: Medicaid Other | Admitting: Obstetrics

## 2014-11-22 ENCOUNTER — Encounter: Payer: Self-pay | Admitting: Obstetrics

## 2014-11-22 VITALS — BP 103/71 | HR 75 | Temp 98.8°F | Wt 131.0 lb

## 2014-11-22 DIAGNOSIS — R102 Pelvic and perineal pain: Secondary | ICD-10-CM | POA: Diagnosis not present

## 2014-11-22 DIAGNOSIS — N946 Dysmenorrhea, unspecified: Secondary | ICD-10-CM | POA: Diagnosis not present

## 2014-11-22 DIAGNOSIS — N941 Unspecified dyspareunia: Secondary | ICD-10-CM

## 2014-11-22 MED ORDER — OXYCODONE HCL 10 MG PO TABS: 10.0000 mg | ORAL_TABLET | Freq: Four times a day (QID) | ORAL | Status: DC | PRN

## 2014-11-22 NOTE — Progress Notes (Signed)
Patient ID: Lydia [name], female   DOB: 1985-07-09, 29 y.o.   MRN: [id number]  No chief complaint on file.   HPI Lydia [name] is a 29 y.o. female.  Continued pelvic pain and dyspareunia.  Has been seen in consultation with Dr. Maryland Pink and a treatment plan started.  States that pain is less but still present.    HPI  Past Medical History  Diagnosis Date  . Chronic headaches     none x 2-3 yrs  . Meningitis     Past Surgical History  Procedure Laterality Date  . Cholecystectomy      Family History  Problem Relation Age of Onset  . Hypertension Mother   . Breast cancer Maternal Aunt     Social History History  Substance Use Topics  . Smoking status: Never Smoker   . Smokeless tobacco: Never Used  . Alcohol Use: No    Allergies  Allergen Reactions  . Penicillins Hives    Current Outpatient Prescriptions  Medication Sig Dispense Refill  . Prenat w/o A-FeCbn-Meth-FA-DHA (OB COMPLETE GOLD) 27.5-1-200 MG CAPS Take 1 capsule by mouth daily. 30 capsule 11  . Oxycodone HCl 10 MG TABS Take 1 tablet (10 mg total) by mouth every 6 (six) hours as needed. 40 tablet 0   No current facility-administered medications for this visit.    Review of Systems Review of Systems Constitutional: negative for fatigue and weight loss Respiratory: negative for cough and wheezing Cardiovascular: negative for chest pain, fatigue and palpitations Gastrointestinal: negative for abdominal pain and change in bowel habits Genitourinary: chronic pelvic pain Integument/breast: negative for nipple discharge Musculoskeletal:negative for myalgias Neurological: negative for gait problems and tremors Behavioral/Psych: negative for abusive relationship, depression Endocrine: negative for temperature intolerance     Blood pressure 103/71, pulse 75, temperature 98.8 F (37.1 C), weight 131 lb (59.421 kg).  Physical Exam Physical Exam:          General:  Alert and no distress. Abdomen:   normal findings: no organomegaly, soft, non-tender and no hernia  Pelvis:  External genitalia: normal general appearance Urinary system: urethral meatus normal and bladder without fullness, nontender Vaginal: normal without tenderness, induration or masses Cervix: normal appearance Adnexa: normal bimanual exam Uterus: anteverted and non-tender, normal size      Data Reviewed Notes from Dr. Maryland Pink  Assessment     Chronic pelvic pain.  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  Several options for management discussed.      Plan    Patient to call Dr. Zigmund Daniel for F/U appointment.  No orders of the defined types were placed in this encounter.   Meds ordered this encounter  Medications  . DISCONTD: Oxycodone HCl 10 MG TABS    Sig: Take 1 tablet (10 mg total) by mouth every 6 (six) hours as needed.    Dispense:  40 tablet    Refill:  0  . Oxycodone HCl 10 MG TABS    Sig: Take 1 tablet (10 mg total) by mouth every 6 (six) hours as needed.    Dispense:  40 tablet    Refill:  0

## 2014-11-30 ENCOUNTER — Other Ambulatory Visit (INDEPENDENT_AMBULATORY_CARE_PROVIDER_SITE_OTHER): Payer: Medicaid Other

## 2014-11-30 VITALS — BP 101/68 | HR 80 | Temp 98.2°F | Ht 59.0 in | Wt 130.0 lb

## 2014-11-30 DIAGNOSIS — Z3200 Encounter for pregnancy test, result unknown: Secondary | ICD-10-CM | POA: Diagnosis not present

## 2014-11-30 LAB — POCT URINE PREGNANCY: PREG TEST UR: POSITIVE — AB

## 2014-12-05 ENCOUNTER — Inpatient Hospital Stay (HOSPITAL_COMMUNITY)
Admission: AD | Admit: 2014-12-05 | Discharge: 2014-12-05 | Disposition: A | Discharge status: Home / Self Care | Payer: Medicaid Other | Source: Ambulatory Visit | Attending: Obstetrics | Admitting: Obstetrics

## 2014-12-05 ENCOUNTER — Inpatient Hospital Stay (HOSPITAL_COMMUNITY): Payer: Medicaid Other

## 2014-12-05 ENCOUNTER — Encounter (HOSPITAL_COMMUNITY): Payer: Self-pay | Admitting: *Deleted

## 2014-12-05 DIAGNOSIS — Z3A01 Less than 8 weeks gestation of pregnancy: Secondary | ICD-10-CM | POA: Diagnosis not present

## 2014-12-05 DIAGNOSIS — R102 Pelvic and perineal pain: Secondary | ICD-10-CM

## 2014-12-05 DIAGNOSIS — R1030 Lower abdominal pain, unspecified: Secondary | ICD-10-CM | POA: Insufficient documentation

## 2014-12-05 DIAGNOSIS — O9989 Other specified diseases and conditions complicating pregnancy, childbirth and the puerperium: Secondary | ICD-10-CM | POA: Insufficient documentation

## 2014-12-05 DIAGNOSIS — O26891 Other specified pregnancy related conditions, first trimester: Secondary | ICD-10-CM

## 2014-12-05 DIAGNOSIS — R109 Unspecified abdominal pain: Secondary | ICD-10-CM | POA: Diagnosis present

## 2014-12-05 LAB — URINALYSIS, ROUTINE W REFLEX MICROSCOPIC
Bilirubin Urine: NEGATIVE
Glucose, UA: NEGATIVE mg/dL
HGB URINE DIPSTICK: NEGATIVE
KETONES UR: NEGATIVE mg/dL
Leukocytes, UA: NEGATIVE
NITRITE: NEGATIVE
PH: 5 (ref 5.0–8.0)
PROTEIN: NEGATIVE mg/dL
Specific Gravity, Urine: >1.03 — ABNORMAL HIGH (ref 1.005–1.030)
Urobilinogen, UA: 4 mg/dL — ABNORMAL HIGH (ref 0.0–1.0)

## 2014-12-05 LAB — CBC
HEMATOCRIT: 37.7 % (ref 36.0–46.0)
HEMOGLOBIN: 12.6 g/dL (ref 12.0–15.0)
MCH: 29.2 pg (ref 26.0–34.0)
MCHC: 33.4 g/dL (ref 30.0–36.0)
MCV: 87.5 fL (ref 78.0–100.0)
Platelets: 354 10*3/uL (ref 150–400)
RBC: 4.31 MIL/uL (ref 3.87–5.11)
RDW: 12.5 % (ref 11.5–15.5)
WBC: 10.6 10*3/uL — AB (ref 4.0–10.5)

## 2014-12-05 LAB — HCG, QUANTITATIVE, PREGNANCY: HCG, BETA CHAIN, QUANT, S: 961 m[IU]/mL — AB (ref <5)

## 2014-12-05 LAB — POCT PREGNANCY, URINE: Preg Test, Ur: POSITIVE — AB

## 2014-12-05 LAB — WET PREP, GENITAL
CLUE CELLS WET PREP: NONE SEEN
Trich, Wet Prep: NONE SEEN
Yeast Wet Prep HPF POC: NONE SEEN

## 2014-12-05 LAB — OB RESULTS CONSOLE GC/CHLAMYDIA: Gonorrhea: NEGATIVE

## 2014-12-05 NOTE — MAU Note (Signed)
Had pos UPT @ MD office last week, had been cramping before finding out if pregnant, started having sharp RLQ pain yesterday.  Denies bleeding or discharge.

## 2014-12-05 NOTE — Discharge Instructions (Signed)
Abdominal Pain During Pregnancy Abdominal pain is common in pregnancy. Most of the time, it does not cause harm. There are many causes of abdominal pain. Some causes are more serious than others. Some of the causes of abdominal pain in pregnancy are easily diagnosed. Occasionally, the diagnosis takes time to understand. Other times, the cause is not determined. Abdominal pain can be a sign that something is very wrong with the pregnancy, or the pain may have nothing to do with the pregnancy at all. For this reason, always tell your health care provider if you have any abdominal discomfort. HOME CARE INSTRUCTIONS  Monitor your abdominal pain for any changes. The following actions may help to alleviate any discomfort you are experiencing:  Do not have sexual intercourse or put anything in your vagina until your symptoms go away completely.  Get plenty of rest until your pain improves.  Drink clear fluids if you feel nauseous. Avoid solid food as long as you are uncomfortable or nauseous.  Only take over-the-counter or prescription medicine as directed by your health care provider.  Keep all follow-up appointments with your health care provider. SEEK IMMEDIATE MEDICAL CARE IF:  You are bleeding, leaking fluid, or passing tissue from the vagina.  You have increasing pain or cramping.  You have persistent vomiting.  You have painful or bloody urination.  You have a fever.  You notice a decrease in your baby's movements.  You have extreme weakness or feel faint.  You have shortness of breath, with or without abdominal pain.  You develop a severe headache with abdominal pain.  You have abnormal vaginal discharge with abdominal pain.  You have persistent diarrhea.  You have abdominal pain that continues even after rest, or gets worse. MAKE SURE YOU:   Understand these instructions.  Will watch your condition.  Will get help right away if you are not doing well or get  worse. Document Released: 04/29/2005 Document Revised: 02/17/2013 Document Reviewed: 11/26/2012 [facility] Patient Information 2015 [place], [place]. This information is not intended to replace advice given to you by your health care provider. Make sure you discuss any questions you have with your health care provider.  YOU DO NOT HAVE AN ECTOPIC PREGNANCY__I AM GIVING YOU THIS HANDOUT SO YOU KNOW WHAT SYMPTOMS NEED TO BE REEVALUATED Ectopic Pregnancy An ectopic pregnancy happens when a fertilized egg grows outside the uterus. A pregnancy cannot live outside of the uterus. This problem often happens in the fallopian tube. It is often caused by damage to the fallopian tube. If this problem is found early, you may be treated with medicine. If your tube tears or bursts open (ruptures), you will bleed inside. This is an emergency. You will need surgery. Get help right away.  SYMPTOMS You may have normal pregnancy symptoms at first. These include:  Missing your period.  Feeling sick to your stomach (nauseous).  Being tired.  Having tender breasts. Then, you may start to have symptoms that are not normal. These include:  Pain with sex (intercourse).  Bleeding from the vagina. This includes light bleeding (spotting).  Belly (abdomen) or lower belly cramping or pain. This may be felt on one side.  A fast heartbeat (pulse).  Passing out (fainting) after going poop (bowel movement). If your tube tears, you may have symptoms such as:  Really bad pain in the belly or lower belly. This happens suddenly.  Dizziness.  Passing out.  Shoulder pain. GET HELP RIGHT AWAY IF:  You have any of these  symptoms. This is an emergency. MAKE SURE YOU:  Understand these instructions.  Will watch your condition.  Will get help right away if you are not doing well or get worse. Document Released: 07/26/2008 Document Revised: 05/04/2013 Document Reviewed: 12/09/2012 [facility] Patient Information 2015  [place], [place]. This information is not intended to replace advice given to you by your health care provider. Make sure you discuss any questions you have with your health care provider.

## 2014-12-05 NOTE — MAU Provider Note (Signed)
History     CSN: [id number]  Arrival date and time: 12/05/14 1528   None     Chief Complaint  Patient presents with  . Abdominal Pain   HPI Lydia [name] is 29 y.o. G3O7564 [redacted]w[redacted]d weeks presenting sharp abdominal pain.  Patient of Dr. Jacelyn Grip.  She had + UPT in his office last week.   States the pain began several weeks ago.  Has not taken anything for pain.  Rates the pain at its worse 10/10 and currently "cramping"  6/10.  She points to her mid lower abdomen.  Denies nausea and vomiting.     Past Medical History  Diagnosis Date  . Chronic headaches     none x 2-3 yrs  . Meningitis     Past Surgical History  Procedure Laterality Date  . Cholecystectomy      Family History  Problem Relation Age of Onset  . Hypertension Mother   . Breast cancer Maternal Aunt     History  Substance Use Topics  . Smoking status: Never Smoker   . Smokeless tobacco: Never Used  . Alcohol Use: No    Allergies:  Allergies  Allergen Reactions  . Penicillins Hives    Prescriptions prior to admission  Medication Sig Dispense Refill Last Dose  . Oxycodone HCl 10 MG TABS Take 1 tablet (10 mg total) by mouth every 6 (six) hours as needed. 40 tablet 0 Past Month at Unknown time  . Prenat w/o A-FeCbn-Meth-FA-DHA (OB COMPLETE GOLD) 27.5-1-200 MG CAPS Take 1 capsule by mouth daily. 30 capsule 11 12/05/2014 at Unknown time    Review of Systems  Constitutional: Negative for fever and chills.  Gastrointestinal: Positive for abdominal pain.  Genitourinary: Negative for dysuria, urgency, frequency and hematuria.       Neg for vaginal discharge or bleeding  Neurological: Negative for headaches.   Physical Exam   Blood pressure 101/56, pulse 80, temperature 98.5 F (36.9 C), temperature source Oral, resp. rate 16, height 4\' 11"  (1.499 m), weight 131 lb 12.8 oz (59.784 kg), last menstrual period 10/29/2014.  Physical Exam  Nursing note and vitals reviewed. Constitutional: She is oriented to  person, place, and time. She appears well-developed and well-nourished. No distress.  HENT:  Head: Normocephalic.  Neck: Normal range of motion.  Cardiovascular: Normal rate.   Respiratory: Effort normal.  GI: Soft. She exhibits no distension and no mass. There is tenderness (bilateral- R slightly greater than L). There is no rebound and no guarding.  Genitourinary: There is no rash, tenderness or lesion on the right labia. There is no rash, tenderness or lesion on the left labia. No erythema, tenderness or bleeding in the vagina. Vaginal discharge (small amount of vaginal discharge with mild odor) found.  Neurological: She is alert and oriented to person, place, and time.  Skin: Skin is warm and dry.  Psychiatric: She has a normal mood and affect. Her behavior is normal.    ABO RH O Positive per previous record.   Results for orders placed or performed during the hospital encounter of 12/05/14 (from the past 24 hour(s))  Urinalysis, Routine w reflex microscopic (not at [facility])     Status: Abnormal   Collection Time: 12/05/14  3:53 PM  Result Value Ref Range   Color, Urine YELLOW YELLOW   APPearance CLEAR CLEAR   Specific Gravity, Urine >1.030 (H) 1.005 - 1.030   pH 5.0 5.0 - 8.0   Glucose, UA NEGATIVE NEGATIVE mg/dL   Hgb urine  dipstick NEGATIVE NEGATIVE   Bilirubin Urine NEGATIVE NEGATIVE   Ketones, ur NEGATIVE NEGATIVE mg/dL   Protein, ur NEGATIVE NEGATIVE mg/dL   Urobilinogen, UA 4.0 (H) 0.0 - 1.0 mg/dL   Nitrite NEGATIVE NEGATIVE   Leukocytes, UA NEGATIVE NEGATIVE  Pregnancy, urine POC     Status: Abnormal   Collection Time: 12/05/14  4:03 PM  Result Value Ref Range   Preg Test, Ur POSITIVE (A) NEGATIVE  CBC     Status: Abnormal   Collection Time: 12/05/14  5:10 PM  Result Value Ref Range   WBC 10.6 (H) 4.0 - 10.5 K/uL   RBC 4.31 3.87 - 5.11 MIL/uL   Hemoglobin 12.6 12.0 - 15.0 g/dL   HCT 37.7 36.0 - 46.0 %   MCV 87.5 78.0 - 100.0 fL   MCH 29.2 26.0 - 34.0 pg   MCHC 33.4  30.0 - 36.0 g/dL   RDW 12.5 11.5 - 15.5 %   Platelets 354 150 - 400 K/uL  hCG, quantitative, pregnancy     Status: Abnormal   Collection Time: 12/05/14  5:10 PM  Result Value Ref Range   hCG, Beta Chain, Quant, S 961 (H) <5 mIU/mL  Wet prep, genital     Status: Abnormal   Collection Time: 12/05/14  5:48 PM  Result Value Ref Range   Yeast Wet Prep HPF POC NONE SEEN NONE SEEN   Trich, Wet Prep NONE SEEN NONE SEEN   Clue Cells Wet Prep HPF POC NONE SEEN NONE SEEN   WBC, Wet Prep HPF POC MANY (A) NONE SEEN  US Ob Comp Less 14 Wks  12/05/2014   CLINICAL DATA:  Right lower quadrant pain, onset yesterday. No bleeding.  EXAM: OBSTETRIC <14 WK [place] AND TRANSVAGINAL OB US  TECHNIQUE: Both transabdominal and transvaginal ultrasound examinations were performed for complete evaluation of the gestation as well as the maternal uterus, adnexal regions, and pelvic cul-de-sac. Transvaginal technique was performed to assess early pregnancy.  COMPARISON:  None.  FINDINGS: Intrauterine gestational sac: No  Yolk sac:  No  Embryo:  No  Cardiac Activity: No  Maternal uterus/adnexae: Normal  IMPRESSION: No visible gestational sac. Normal maternal adnexal regions. No abnormal fluid collections. Differential considerations include an intrauterine gestation which is too small to visualize, spontaneous complete abortion, ectopic gestation. Correlation with quantitative HCG and careful follow-up necessary.   Electronically Signed   By: Andreas Newport M.D.   On: 12/05/2014 17:42   US Ob Transvaginal  12/05/2014   CLINICAL DATA:  Right lower quadrant pain, onset yesterday. No bleeding.  EXAM: OBSTETRIC <14 WK [place] AND TRANSVAGINAL OB US  TECHNIQUE: Both transabdominal and transvaginal ultrasound examinations were performed for complete evaluation of the gestation as well as the maternal uterus, adnexal regions, and pelvic cul-de-sac. Transvaginal technique was performed to assess early pregnancy.  COMPARISON:  None.  FINDINGS:  Intrauterine gestational sac: No  Yolk sac:  No  Embryo:  No  Cardiac Activity: No  Maternal uterus/adnexae: Normal  IMPRESSION: No visible gestational sac. Normal maternal adnexal regions. No abnormal fluid collections. Differential considerations include an intrauterine gestation which is too small to visualize, spontaneous complete abortion, ectopic gestation. Correlation with quantitative HCG and careful follow-up necessary.   Electronically Signed   By: Andreas Newport M.D.   On: 12/05/2014 17:42   MAU Course  Procedures  GC/CHL and HIV pending  MDM MSE Exam Labs U/S  Assessment and Plan  A;  Lower abdominal pain at [redacted]w[redacted]d gestation.  IUP not established-unknown location at this time  P:  Return to MAU in 48 hrs for repeat BHCG     Return sooner for increase in pain and /or vaginal bleeding--ectopic precautions given.     Instructed to take Tylenol prn pain as directed on bottle  Brandonlee Navis,EVE M 12/05/2014, 6:04 PM

## 2014-12-06 LAB — GC/CHLAMYDIA PROBE AMP (~~LOC~~) NOT AT ARMC
Chlamydia: NEGATIVE
NEISSERIA GONORRHEA: NEGATIVE

## 2014-12-06 LAB — HIV ANTIBODY (ROUTINE TESTING W REFLEX): HIV Screen 4th Generation wRfx: NONREACTIVE

## 2014-12-07 ENCOUNTER — Inpatient Hospital Stay (HOSPITAL_COMMUNITY)
Admission: AD | Admit: 2014-12-07 | Discharge: 2014-12-07 | Disposition: A | Discharge status: Home / Self Care | Payer: Medicaid Other | Source: Ambulatory Visit | Attending: Obstetrics | Admitting: Obstetrics

## 2014-12-07 DIAGNOSIS — O3680X0 Pregnancy with inconclusive fetal viability, not applicable or unspecified: Secondary | ICD-10-CM

## 2014-12-07 DIAGNOSIS — O9989 Other specified diseases and conditions complicating pregnancy, childbirth and the puerperium: Secondary | ICD-10-CM | POA: Insufficient documentation

## 2014-12-07 DIAGNOSIS — R102 Pelvic and perineal pain: Secondary | ICD-10-CM | POA: Diagnosis not present

## 2014-12-07 DIAGNOSIS — O0281 Inappropriate change in quantitative human chorionic gonadotropin (hCG) in early pregnancy: Secondary | ICD-10-CM

## 2014-12-07 LAB — HCG, QUANTITATIVE, PREGNANCY: hCG, Beta Chain, Quant, S: 2220 m[IU]/mL — ABNORMAL HIGH (ref <5)

## 2014-12-07 NOTE — MAU Provider Note (Signed)
  History     CSN: [id number]  Arrival date and time: 12/07/14 1759   None     No chief complaint on file.  HPI  Lydia [name] 29 y.o. G4P2012@[redacted]w[redacted]d  presents to MAU for repeat quant.  She denies vaginal bleeding or abdominal pain.  No new symptoms.    OB History    Gravida Para Term Preterm AB TAB SAB Ectopic Multiple Living   4 2 2  0 1 0 1 0 0 2      Past Medical History  Diagnosis Date  . Chronic headaches     none x 2-3 yrs  . Meningitis     Past Surgical History  Procedure Laterality Date  . Cholecystectomy      Family History  Problem Relation Age of Onset  . Hypertension Mother   . Breast cancer Maternal Aunt     History  Substance Use Topics  . Smoking status: Never Smoker   . Smokeless tobacco: Never Used  . Alcohol Use: No    Allergies:  Allergies  Allergen Reactions  . Penicillins Hives    Prescriptions prior to admission  Medication Sig Dispense Refill Last Dose  . Prenat w/o A-FeCbn-Meth-FA-DHA (OB COMPLETE GOLD) 27.5-1-200 MG CAPS Take 1 capsule by mouth daily. 30 capsule 11 12/05/2014 at Unknown time    ROS Pertinent ROS in HPI.  All other systems are negative.   Physical Exam   Last menstrual period 10/29/2014.   Physical Exam  Constitutional: She is oriented to person, place, and time. She appears well-developed and well-nourished. No distress.  HENT:  Head: Atraumatic.  Eyes: EOM are normal.  Respiratory: No respiratory distress.  Neurological: She is alert and oriented to person, place, and time.  Psychiatric: She has a normal mood and affect.    MAU Course  Procedures  MDM HCG on 7/25: 221 HCG 7/27: 961   Assessment and Plan  A: Appropriate rise in HCG. Unknown anatomic location of pregnancy  P: Discharge to home Ectopic precautions reviewed Return in 48 hours to MAU for quant HCG Patient may return to MAU as needed or if her condition were to change or worsen   Paticia Stack 12/07/2014, 7:34 PM

## 2014-12-07 NOTE — Discharge Instructions (Signed)
°Ectopic Pregnancy °An ectopic pregnancy is when the fertilized egg attaches (implants) outside the uterus. Most ectopic pregnancies occur in the fallopian tube. Rarely do ectopic pregnancies occur on the ovary, intestine, pelvis, or cervix. In an ectopic pregnancy, the fertilized egg does not have the ability to develop into a normal, healthy baby.  °A ruptured ectopic pregnancy is one in which the fallopian tube gets torn or bursts and results in internal bleeding. Often there is intense abdominal pain, and sometimes, vaginal bleeding. Having an ectopic pregnancy can be life threatening. If left untreated, this dangerous condition can lead to a blood transfusion, abdominal surgery, or even death. °CAUSES  °Damage to the fallopian tubes is the suspected cause in most ectopic pregnancies.  °RISK FACTORS °Depending on your circumstances, the risk of having an ectopic pregnancy will vary. The level of risk can be divided into three categories. °High Risk °· You have gone through infertility treatment. °· You have had a previous ectopic pregnancy. °· You have had previous tubal surgery. °· You have had previous surgery to have the fallopian tubes tied (tubal ligation). °· You have tubal problems or diseases. °· You have been exposed to DES. DES is a medicine that was used until 1971 and had effects on babies whose mothers took the medicine. °· You become pregnant while using an intrauterine device (IUD) for birth control.  °Moderate Risk °· You have a history of infertility. °· You have a history of a sexually transmitted infection (STI). °· You have a history of pelvic inflammatory disease (PID). °· You have scarring from endometriosis. °· You have multiple sexual partners. °· You smoke.  °Low Risk °· You have had previous pelvic surgery. °· You use vaginal douching. °· You became sexually active before 28 years of age. °SIGNS AND SYMPTOMS  °An ectopic pregnancy should be suspected in anyone who has missed a period  and has abdominal pain or bleeding. °· You may experience normal pregnancy symptoms, such as: °¨ Nausea. °¨ Tiredness. °¨ Breast tenderness. °· Other symptoms may include: °¨ Pain with intercourse. °¨ Irregular vaginal bleeding or spotting. °¨ Cramping or pain on one side or in the lower abdomen. °¨ Fast heartbeat. °¨ Passing out while having a bowel movement. °· Symptoms of a ruptured ectopic pregnancy and internal bleeding may include: °¨ Sudden, severe pain in the abdomen and pelvis. °¨ Dizziness or fainting. °¨ Pain in the shoulder area. °DIAGNOSIS  °Tests that may be performed include: °· A pregnancy test. °· An ultrasound test. °· Testing the specific level of pregnancy hormone in the bloodstream. °· Taking a sample of uterus tissue (dilation and curettage, D&C). °· Surgery to perform a visual exam of the inside of the abdomen using a thin, lighted tube with a tiny camera on the end (laparoscope). °TREATMENT  °An injection of a medicine called methotrexate may be given. This medicine causes the pregnancy tissue to be absorbed. It is given if: °· The diagnosis is made early. °· The fallopian tube has not ruptured. °· You are considered to be a good candidate for the medicine. °Usually, pregnancy hormone blood levels are checked after methotrexate treatment. This is to be sure the medicine is effective. It may take 4-6 weeks for the pregnancy to be absorbed (though most pregnancies will be absorbed by 3 weeks). °Surgical treatment may be needed. A laparoscope may be used to remove the pregnancy tissue. If severe internal bleeding occurs, a cut (incision) may be made in the lower abdomen (laparotomy), and the ectopic   pregnancy is removed. This stops the bleeding. Part of the fallopian tube, or the whole tube, may be removed as well (salpingectomy). After surgery, pregnancy hormone tests may be done to be sure there is no pregnancy tissue left. You may receive a Rho (D) immune globulin shot if you are Rh negative  and the father is Rh positive, or if you do not know the Rh type of the father. This is to prevent problems with any future pregnancy. °SEEK IMMEDIATE MEDICAL CARE IF:  °You have any symptoms of an ectopic pregnancy. This is a medical emergency. °MAKE SURE YOU: °· Understand these instructions. °· Will watch your condition. °· Will get help right away if you are not doing well or get worse. °Document Released: 06/06/2004 Document Revised: 09/13/2013 Document Reviewed: 11/26/2012 °ExitCare® Patient Information ©2015 [employer]. This information is not intended to replace advice given to you by your health care provider. Make sure you discuss any questions you have with your health care provider. ° ° °

## 2014-12-09 ENCOUNTER — Inpatient Hospital Stay (HOSPITAL_COMMUNITY)
Admission: AD | Admit: 2014-12-09 | Discharge: 2014-12-09 | Disposition: A | Discharge status: Home / Self Care | Payer: Medicaid Other | Source: Ambulatory Visit | Attending: Obstetrics | Admitting: Obstetrics

## 2014-12-09 DIAGNOSIS — R109 Unspecified abdominal pain: Secondary | ICD-10-CM | POA: Diagnosis not present

## 2014-12-09 DIAGNOSIS — O26899 Other specified pregnancy related conditions, unspecified trimester: Secondary | ICD-10-CM

## 2014-12-09 DIAGNOSIS — O9989 Other specified diseases and conditions complicating pregnancy, childbirth and the puerperium: Secondary | ICD-10-CM | POA: Insufficient documentation

## 2014-12-09 DIAGNOSIS — Z3A01 Less than 8 weeks gestation of pregnancy: Secondary | ICD-10-CM | POA: Diagnosis not present

## 2014-12-09 LAB — HCG, QUANTITATIVE, PREGNANCY: HCG, BETA CHAIN, QUANT, S: 4175 m[IU]/mL — AB (ref <5)

## 2014-12-09 NOTE — MAU Provider Note (Signed)
Ms. Lydia [name]  is a 29 y.o. [phone] at [redacted]w[redacted]d who presents to MAU today for follow-up quant hCG after 48 hours. She denies abdominal pain or vaginal bleeding today.   BP 108/58 mmHg  Pulse 76  Temp(Src) 98.6 F (37 C) (Oral)  Resp 18  Ht 5' (1.524 m)  Wt 132 lb 4 oz (59.988 kg)  BMI 25.83 kg/m2  LMP 10/29/2014 (Approximate)  CONSTITUTIONAL: Well-developed, well-nourished female in no acute distress.  ENT: External right and left ear normal.  EYES: EOM intact, conjunctivae normal.  MUSCULOSKELETAL: Normal range of motion.  CARDIOVASCULAR: Regular heart rate RESPIRATORY: Normal effort NEUROLOGICAL: Alert and oriented to person, place, and time.  SKIN: Skin is warm and dry. No rash noted. Not diaphoretic. No erythema. No pallor. PSYCH: Normal mood and affect. Normal behavior. Normal judgment and thought content.  Results for ELLINGTON, CORNIA (MRN [id number]) as of 12/09/2014 19:28  Ref. Range 12/05/2014 17:10 12/05/2014 17:35 12/05/2014 17:48 12/07/2014 18:15 12/09/2014 18:15  HCG, Beta Chain, Quant, S Latest Ref Range: <5 mIU/mL 961 (H)   2220 (H) 4175 (H)   A: Appropriate rise in quant hCG after 48 hours  P: Discharge home First trimester/ectopic precautions discussed Patient will return for follow-up US next week. Order placed. They will call the patient with an appointment time Patient may return to MAU as needed or if her condition were to change or worsen   Luvenia Redden, PA-C 12/09/2014 7:28 PM

## 2014-12-09 NOTE — MAU Note (Signed)
Patient presents for follow up lab work. Denies pain, bleeding or discharge.

## 2014-12-09 NOTE — MAU Note (Signed)
Kerry Hough PA in Triage discussing lab result and d/c plan with pt. Pt d/c home from Triage

## 2014-12-09 NOTE — Discharge Instructions (Signed)
First Trimester of Pregnancy The first trimester of pregnancy is from week 1 until the end of week 12 (months 1 through 3). During this time, your baby will begin to develop inside you. At 6-8 weeks, the eyes and face are formed, and the heartbeat can be seen on ultrasound. At the end of 12 weeks, all the baby's organs are formed. Prenatal care is all the medical care you receive before the birth of your baby. Make sure you get good prenatal care and follow all of your doctor's instructions. HOME CARE  Medicines  Take medicine only as told by your doctor. Some medicines are safe and some are not during pregnancy.  Take your prenatal vitamins as told by your doctor.  Take medicine that helps you poop (stool softener) as needed if your doctor says it is okay. Diet  Eat regular, healthy meals.  Your doctor will tell you the amount of weight gain that is right for you.  Avoid raw meat and uncooked cheese.  If you feel sick to your stomach (nauseous) or throw up (vomit):  Eat 4 or 5 small meals a day instead of 3 large meals.  Try eating a few soda crackers.  Drink liquids between meals instead of during meals.  If you have a hard time pooping (constipation):  Eat high-fiber foods like fresh vegetables, fruit, and whole grains.  Drink enough fluids to keep your pee (urine) clear or pale yellow. Activity and Exercise  Exercise only as told by your doctor. Stop exercising if you have cramps or pain in your lower belly (abdomen) or low back.  Try to avoid standing for long periods of time. Move your legs often if you must stand in one place for a long time.  Avoid heavy lifting.  Wear low-heeled shoes. Sit and stand up straight.  You can have sex unless your doctor tells you not to. Relief of Pain or Discomfort  Wear a good support bra if your breasts are sore.  Take warm water baths (sitz baths) to soothe pain or discomfort caused by hemorrhoids. Use hemorrhoid cream if your  doctor says it is okay.  Rest with your legs raised if you have leg cramps or low back pain.  Wear support hose if you have puffy, bulging veins (varicose veins) in your legs. Raise (elevate) your feet for 15 minutes, 3-4 times a day. Limit salt in your diet. Prenatal Care  Schedule your prenatal visits by the twelfth week of pregnancy.  Write down your questions. Take them to your prenatal visits.  Keep all your prenatal visits as told by your doctor. Safety  Wear your seat belt at all times when driving.  Make a list of emergency phone numbers. The list should include numbers for family, friends, the hospital, and police and fire departments. General Tips  Ask your doctor for a referral to a local prenatal class. Begin classes no later than at the start of month 6 of your pregnancy.  Ask for help if you need counseling or help with nutrition. Your doctor can give you advice or tell you where to go for help.  Do not use hot tubs, steam rooms, or saunas.  Do not douche or use tampons or scented sanitary pads.  Do not cross your legs for long periods of time.  Avoid litter boxes and soil used by cats.  Avoid all smoking, herbs, and alcohol. Avoid drugs not approved by your doctor.  Visit your dentist. At home, brush your teeth   with a soft toothbrush. Be gentle when you floss. GET HELP IF:  You are dizzy.  You have mild cramps or pressure in your lower belly.  You have a nagging pain in your belly area.  You continue to feel sick to your stomach, throw up, or have watery poop (diarrhea).  You have a bad smelling fluid coming from your vagina.  You have pain with peeing (urination).  You have increased puffiness (swelling) in your face, hands, legs, or ankles. GET HELP RIGHT AWAY IF:   You have a fever.  You are leaking fluid from your vagina.  You have spotting or bleeding from your vagina.  You have very bad belly cramping or pain.  You gain or lose weight  rapidly.  You throw up blood. It may look like coffee grounds.  You are around people who have German measles, fifth disease, or chickenpox.  You have a very bad headache.  You have shortness of breath.  You have any kind of trauma, such as from a fall or a car accident. Document Released: 10/16/2007 Document Revised: 09/13/2013 Document Reviewed: 03/09/2013 ExitCare Patient Information 2015 [employer]. This information is not intended to replace advice given to you by your health care provider. Make sure you discuss any questions you have with your health care provider.  

## 2014-12-14 ENCOUNTER — Ambulatory Visit (HOSPITAL_COMMUNITY)
Admission: RE | Admit: 2014-12-14 | Discharge: 2014-12-14 | Disposition: A | Discharge status: Home / Self Care | Payer: Medicaid Other | Source: Ambulatory Visit | Attending: Medical | Admitting: Medical

## 2014-12-14 ENCOUNTER — Inpatient Hospital Stay (HOSPITAL_COMMUNITY)
Admission: AD | Admit: 2014-12-14 | Discharge: 2014-12-14 | Disposition: A | Discharge status: Home / Self Care | Payer: Medicaid Other | Source: Ambulatory Visit | Attending: Obstetrics | Admitting: Obstetrics

## 2014-12-14 DIAGNOSIS — O9989 Other specified diseases and conditions complicating pregnancy, childbirth and the puerperium: Secondary | ICD-10-CM | POA: Diagnosis not present

## 2014-12-14 DIAGNOSIS — R109 Unspecified abdominal pain: Secondary | ICD-10-CM | POA: Insufficient documentation

## 2014-12-14 DIAGNOSIS — Z3490 Encounter for supervision of normal pregnancy, unspecified, unspecified trimester: Principal | ICD-10-CM

## 2014-12-14 DIAGNOSIS — O26899 Other specified pregnancy related conditions, unspecified trimester: Secondary | ICD-10-CM

## 2014-12-14 DIAGNOSIS — Z3A01 Less than 8 weeks gestation of pregnancy: Secondary | ICD-10-CM | POA: Insufficient documentation

## 2014-12-14 DIAGNOSIS — Z331 Pregnant state, incidental: Secondary | ICD-10-CM

## 2014-12-14 NOTE — MAU Provider Note (Signed)
Ms. Lydia [name] is a 29 y.o. female [phone] at [redacted]w[redacted]d presenting to MAU for a follow up US.  She currently is not having any pain or bleeding. She is without concern.   US Ob Transvaginal  12/14/2014   CLINICAL DATA:  Abdominal pain.  Pregnant.  EXAM: TRANSVAGINAL OB ULTRASOUND  TECHNIQUE: Transvaginal ultrasound was performed for complete evaluation of the gestation as well as the maternal uterus, adnexal regions, and pelvic cul-de-sac.  COMPARISON:  Pelvic ultrasound 12/05/2014  FINDINGS: Intrauterine gestational sac: Present and normal in shape.  Yolk sac:  Present  Embryo:  Present  Cardiac Activity: Present  Heart Rate: 120 bpm  MSD: 2.1  mm   5 w   6  d  [place] EDC:  08/10/2015  Maternal uterus/adnexae: Negative for free fluid.  No mass lesion.  IMPRESSION: Single living intrauterine pregnancy 5 weeks 6 days.   Electronically Signed   By: Franchot Gallo M.D.   On: 12/14/2014 14:32    Lezlie Lye, NP 12/14/2014 2:55 PM

## 2014-12-23 ENCOUNTER — Encounter (HOSPITAL_COMMUNITY): Payer: Self-pay | Admitting: *Deleted

## 2014-12-23 ENCOUNTER — Inpatient Hospital Stay (HOSPITAL_COMMUNITY)
Admission: AD | Admit: 2014-12-23 | Discharge: 2014-12-23 | Disposition: A | Discharge status: Home / Self Care | Payer: Medicaid Other | Source: Ambulatory Visit | Attending: Obstetrics | Admitting: Obstetrics

## 2014-12-23 DIAGNOSIS — Z880 Allergy status to penicillin: Secondary | ICD-10-CM | POA: Diagnosis not present

## 2014-12-23 DIAGNOSIS — Z3A01 Less than 8 weeks gestation of pregnancy: Secondary | ICD-10-CM | POA: Insufficient documentation

## 2014-12-23 DIAGNOSIS — K5900 Constipation, unspecified: Secondary | ICD-10-CM | POA: Insufficient documentation

## 2014-12-23 DIAGNOSIS — R109 Unspecified abdominal pain: Secondary | ICD-10-CM | POA: Diagnosis present

## 2014-12-23 DIAGNOSIS — O9989 Other specified diseases and conditions complicating pregnancy, childbirth and the puerperium: Secondary | ICD-10-CM | POA: Diagnosis not present

## 2014-12-23 LAB — URINALYSIS, ROUTINE W REFLEX MICROSCOPIC
Bilirubin Urine: NEGATIVE
Glucose, UA: NEGATIVE mg/dL
Hgb urine dipstick: NEGATIVE
Ketones, ur: NEGATIVE mg/dL
Leukocytes, UA: NEGATIVE
Nitrite: NEGATIVE
Protein, ur: NEGATIVE mg/dL
Specific Gravity, Urine: 1.02 (ref 1.005–1.030)
Urobilinogen, UA: 1 mg/dL (ref 0.0–1.0)
pH: 6 (ref 5.0–8.0)

## 2014-12-23 MED ORDER — DOCUSATE SODIUM 100 MG PO CAPS: 100.0000 mg | ORAL_CAPSULE | Freq: Two times a day (BID) | ORAL | Status: DC

## 2014-12-23 NOTE — Discharge Instructions (Signed)

## 2014-12-23 NOTE — MAU Provider Note (Signed)
History     CSN: [id number]  Arrival date and time: 12/23/14 0010   First Provider Initiated Contact with Patient 12/23/14 0055      Chief Complaint  Patient presents with  . Abdominal Pain   HPI Comments: Lydia [name] is a 29 y.o. [phone] at [redacted]w[redacted]d who presents today with abdominal pain. She denies any vaginal bleeding. She states that she has been constipated, and has not had a BM in 4+ days. She has been seen here for serial HCGs and [place]. She was noted to have a viable IUP on [place] on 12/14/14.   Abdominal Pain This is a new problem. The current episode started today. The onset quality is sudden (at 2145 ). The problem occurs constantly. The problem has been gradually improving. The pain is located in the periumbilical region. The pain is at a severity of 5/10. The quality of the pain is sharp. The abdominal pain does not radiate. Associated symptoms include constipation (last BM was 4 days ago ) and nausea. Pertinent negatives include no diarrhea, dysuria, fever, frequency or vomiting. Nothing aggravates the pain. The pain is relieved by certain positions (sitting forward). She has tried nothing for the symptoms.    Past Medical History  Diagnosis Date  . Chronic headaches     none x 2-3 yrs  . Meningitis     Past Surgical History  Procedure Laterality Date  . Cholecystectomy      Family History  Problem Relation Age of Onset  . Hypertension Mother   . Breast cancer Maternal Aunt     Social History  Substance Use Topics  . Smoking status: Never Smoker   . Smokeless tobacco: Never Used  . Alcohol Use: No    Allergies:  Allergies  Allergen Reactions  . Penicillins Hives    Prescriptions prior to admission  Medication Sig Dispense Refill Last Dose  . Prenat w/o A-FeCbn-Meth-FA-DHA (OB COMPLETE GOLD) 27.5-1-200 MG CAPS Take 1 capsule by mouth daily. 30 capsule 11 12/22/2014 at Unknown time    Review of Systems  Constitutional: Negative for fever.  Gastrointestinal:  Positive for nausea, abdominal pain and constipation (last BM was 4 days ago ). Negative for vomiting and diarrhea.  Genitourinary: Negative for dysuria, urgency and frequency.   Physical Exam   Blood pressure 106/63, pulse 81, temperature 98.4 F (36.9 C), resp. rate 16, last menstrual period 10/29/2014.  Physical Exam  Nursing note and vitals reviewed. Constitutional: She is oriented to person, place, and time. She appears well-developed and well-nourished. No distress.  HENT:  Head: Normocephalic.  Cardiovascular: Normal rate.   Respiratory: Effort normal.  GI: Soft. There is no tenderness. There is no rebound.  Neurological: She is alert and oriented to person, place, and time.  Skin: Skin is warm and dry.  Psychiatric: She has a normal mood and affect.   Results for orders placed or performed during the hospital encounter of 12/23/14 (from the past 24 hour(s))  Urinalysis, Routine w reflex microscopic (not at [facility])     Status: None   Collection Time: 12/23/14 12:20 AM  Result Value Ref Range   Color, Urine YELLOW YELLOW   APPearance CLEAR CLEAR   Specific Gravity, Urine 1.020 1.005 - 1.030   pH 6.0 5.0 - 8.0   Glucose, UA NEGATIVE NEGATIVE mg/dL   Hgb urine dipstick NEGATIVE NEGATIVE   Bilirubin Urine NEGATIVE NEGATIVE   Ketones, ur NEGATIVE NEGATIVE mg/dL   Protein, ur NEGATIVE NEGATIVE mg/dL   Urobilinogen, UA  1.0 0.0 - 1.0 mg/dL   Nitrite NEGATIVE NEGATIVE   Leukocytes, UA NEGATIVE NEGATIVE    MAU Course  Procedures  MDM 0155: Patient has had a BM after a soap suds enema. She reports that her abdominal pain is gone now.   Assessment and Plan   1. Constipation, unspecified constipation type    DC home Comfort measures reviewed  1st Trimester precautions  RX: colace 100mg  BID #60  Return to MAU as needed FU with OB as planned  Follow-up Information    Follow up with Shelly Bombard, MD.   Specialty:  Obstetrics and Gynecology   Contact information:    [place] [address] [phone]         Mathis Bud 12/23/2014, 12:56 AM

## 2014-12-23 NOTE — MAU Note (Signed)
Pt reports abdominal pain that came on suddenly. Pt described the pain as sharp and over her whole abdomen.

## 2014-12-27 ENCOUNTER — Encounter (HOSPITAL_COMMUNITY): Payer: Self-pay

## 2015-01-11 ENCOUNTER — Ambulatory Visit (INDEPENDENT_AMBULATORY_CARE_PROVIDER_SITE_OTHER): Payer: Medicaid Other | Admitting: Obstetrics

## 2015-01-11 ENCOUNTER — Encounter: Payer: Self-pay | Admitting: Obstetrics

## 2015-01-11 VITALS — BP 101/61 | HR 86 | Temp 98.3°F | Wt 133.0 lb

## 2015-01-11 DIAGNOSIS — Z3481 Encounter for supervision of other normal pregnancy, first trimester: Secondary | ICD-10-CM

## 2015-01-11 DIAGNOSIS — Z3201 Encounter for pregnancy test, result positive: Secondary | ICD-10-CM | POA: Diagnosis not present

## 2015-01-11 LAB — POCT URINE PREGNANCY: PREG TEST UR: POSITIVE — AB

## 2015-01-11 LAB — POCT URINALYSIS DIPSTICK
Bilirubin, UA: NEGATIVE
Blood, UA: NEGATIVE
Glucose, UA: NEGATIVE
Ketones, UA: NEGATIVE
Leukocytes, UA: NEGATIVE
NITRITE UA: NEGATIVE
PH UA: 5
PROTEIN UA: NEGATIVE
Spec Grav, UA: 1.02
Urobilinogen, UA: NEGATIVE

## 2015-01-11 NOTE — Progress Notes (Signed)
Subjective:    Lydia [name] is being seen today for her first obstetrical visit.  This is a planned pregnancy. She is at [redacted]w[redacted]d gestation. 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 Relationship with FOB: spouse, living together. Patient does intend to breast feed. Pregnancy history fully reviewed.  The information documented in the HPI was reviewed and verified.  Menstrual History: OB History    Gravida Para Term Preterm AB TAB SAB Ectopic Multiple Living   4 2 2  0 1 0 1 0 0 2       Patient's last menstrual period was 10/29/2014 (approximate).    Past Medical History  Diagnosis Date  . Chronic headaches     none x 2-3 yrs  . Meningitis     Past Surgical History  Procedure Laterality Date  . Cholecystectomy       (Not in a hospital admission) Allergies  Allergen Reactions  . Penicillins Hives    Social History  Substance Use Topics  . Smoking status: Never Smoker   . Smokeless tobacco: Never Used  . Alcohol Use: No    Family History  Problem Relation Age of Onset  . Hypertension Mother   . Breast cancer Maternal Aunt      Review of Systems Constitutional: negative for weight loss Gastrointestinal: negative for vomiting Genitourinary:negative for genital lesions and vaginal discharge and dysuria Musculoskeletal:negative for back pain Behavioral/Psych: negative for abusive relationship, depression, illegal drug usage and tobacco use    Objective:    BP 101/61 mmHg  Pulse 86  Temp(Src) 98.3 F (36.8 C)  Wt 133 lb (60.328 kg)  LMP 10/29/2014 (Approximate) General Appearance:    Alert, cooperative, no distress, appears stated age  Head:    Normocephalic, without obvious abnormality, atraumatic  Eyes:    PERRL, conjunctiva/corneas clear, EOM's intact, fundi    benign, both eyes  Ears:    Normal TM's and external ear canals, both ears  Nose:   Nares normal, septum midline, mucosa normal, no drainage    or sinus  tenderness  Throat:   Lips, mucosa, and tongue normal; teeth and gums normal  Neck:   Supple, symmetrical, trachea midline, no adenopathy;    thyroid:  no enlargement/tenderness/nodules; no carotid   bruit or JVD  Back:     Symmetric, no curvature, ROM normal, no CVA tenderness  Lungs:     Clear to auscultation bilaterally, respirations unlabored  Chest Wall:    No tenderness or deformity   Heart:    Regular rate and rhythm, S1 and S2 normal, no murmur, rub   or gallop  Breast Exam:    No tenderness, masses, or nipple abnormality  Abdomen:     Soft, non-tender, bowel sounds active all four quadrants,    no masses, no organomegaly  Genitalia:    Normal female without lesion, discharge or tenderness  Extremities:   Extremities normal, atraumatic, no cyanosis or edema  Pulses:   2+ and symmetric all extremities  Skin:   Skin color, texture, turgor normal, no rashes or lesions  Lymph nodes:   Cervical, supraclavicular, and axillary nodes normal  Neurologic:   CNII-XII intact, normal strength, sensation and reflexes    throughout      Lab Review Urine pregnancy test Labs reviewed yes Radiologic studies reviewed yes Assessment:    Pregnancy at [redacted]w[redacted]d weeks    Plan:      Prenatal vitamins.  Counseling provided regarding continued use of seat  belts, cessation of alcohol consumption, smoking or use of illicit drugs; infection precautions i.e., influenza/TDAP immunizations, toxoplasmosis,CMV, parvovirus, listeria and varicella; workplace safety, exercise during pregnancy; routine dental care, safe medications, sexual activity, hot tubs, saunas, pools, travel, caffeine use, fish and methlymercury, potential toxins, hair treatments, varicose veins Weight gain recommendations per IOM guidelines reviewed: underweight/BMI< 18.5--> gain 28 - 40 lbs; normal weight/BMI 18.5 - 24.9--> gain 25 - 35 lbs; overweight/BMI 25 - 29.9--> gain 15 - 25 lbs; obese/BMI >30->gain  11 - 20 lbs Problem list  reviewed and updated. FIRST/CF mutation testing/NIPT/QUAD SCREEN/fragile X/[name] population testing/Spinal muscular atrophy discussed: requested. Role of ultrasound in pregnancy discussed; fetal survey: requested. Amniocentesis discussed: not indicated. VBAC calculator score: VBAC consent form provided No orders of the defined types were placed in this encounter.   Orders Placed This Encounter  Procedures  . Culture, OB Urine  . US OB Comp Less 14 Wks    Standing Status: Future     Number of Occurrences:      Standing Expiration Date: 03/12/2016    Order Specific Question:  Reason for Exam (SYMPTOM  OR DIAGNOSIS REQUIRED)    Answer:  previous fetal limb defect    Order Specific Question:  Preferred imaging location?    Answer:  [facility]Ultrasound  . Obstetric panel  . HIV antibody  . Hemoglobinopathy evaluation  . Varicella zoster antibody, IgG  . Vit D  25 hydroxy (rtn osteoporosis monitoring)  . POCT urine pregnancy  . POCT urinalysis dipstick    Follow up in 4 weeks.

## 2015-01-12 LAB — OBSTETRIC PANEL
ANTIBODY SCREEN: NEGATIVE
BASOS ABS: 0 10*3/uL (ref 0.0–0.1)
BASOS PCT: 0 % (ref 0–1)
EOS ABS: 0.1 10*3/uL (ref 0.0–0.7)
EOS PCT: 1 % (ref 0–5)
HCT: 36 % (ref 36.0–46.0)
HEMOGLOBIN: 12.2 g/dL (ref 12.0–15.0)
Hepatitis B Surface Ag: NEGATIVE
LYMPHS PCT: 27 % (ref 12–46)
Lymphs Abs: 2.5 10*3/uL (ref 0.7–4.0)
MCH: 29.5 pg (ref 26.0–34.0)
MCHC: 33.9 g/dL (ref 30.0–36.0)
MCV: 87 fL (ref 78.0–100.0)
MONO ABS: 0.5 10*3/uL (ref 0.1–1.0)
MPV: 9.4 fL (ref 8.6–12.4)
Monocytes Relative: 5 % (ref 3–12)
Neutro Abs: 6.1 10*3/uL (ref 1.7–7.7)
Neutrophils Relative %: 67 % (ref 43–77)
PLATELETS: 333 10*3/uL (ref 150–400)
RBC: 4.14 MIL/uL (ref 3.87–5.11)
RDW: 13.3 % (ref 11.5–15.5)
RH TYPE: POSITIVE
Rubella: 4.14 Index — ABNORMAL HIGH (ref <0.90)
WBC: 9.1 10*3/uL (ref 4.0–10.5)

## 2015-01-12 LAB — CULTURE, OB URINE: Colony Count: 30000

## 2015-01-12 LAB — VITAMIN D 25 HYDROXY (VIT D DEFICIENCY, FRACTURES): VIT D 25 HYDROXY: 26 ng/mL — AB (ref 30–100)

## 2015-01-12 LAB — VARICELLA ZOSTER ANTIBODY, IGG: VARICELLA IGG: 916.5 {index} — AB (ref <135.00)

## 2015-01-12 LAB — HIV ANTIBODY (ROUTINE TESTING W REFLEX): HIV 1&2 Ab, 4th Generation: NONREACTIVE

## 2015-01-13 LAB — HEMOGLOBINOPATHY EVALUATION
HGB A2 QUANT: 2.6 % (ref 2.2–3.2)
HGB F QUANT: 0 % (ref 0.0–2.0)
HGB S QUANTITAION: 0 %
Hemoglobin Other: 0 %
Hgb A: 97.4 % (ref 96.8–97.8)

## 2015-01-17 ENCOUNTER — Ambulatory Visit (HOSPITAL_COMMUNITY)
Admission: RE | Admit: 2015-01-17 | Discharge: 2015-01-17 | Disposition: A | Discharge status: Home / Self Care | Payer: Medicaid Other | Source: Ambulatory Visit | Attending: Obstetrics | Admitting: Obstetrics

## 2015-01-17 ENCOUNTER — Other Ambulatory Visit: Payer: Self-pay | Admitting: Obstetrics

## 2015-01-17 ENCOUNTER — Encounter (HOSPITAL_COMMUNITY): Payer: Self-pay | Admitting: Obstetrics

## 2015-01-17 DIAGNOSIS — Z3A13 13 weeks gestation of pregnancy: Secondary | ICD-10-CM

## 2015-01-17 DIAGNOSIS — Z3689 Encounter for other specified antenatal screening: Secondary | ICD-10-CM

## 2015-01-17 DIAGNOSIS — Z3682 Encounter for antenatal screening for nuchal translucency: Secondary | ICD-10-CM

## 2015-01-17 DIAGNOSIS — Q899 Congenital malformation, unspecified: Secondary | ICD-10-CM

## 2015-01-17 DIAGNOSIS — Z8279 Family history of other congenital malformations, deformations and chromosomal abnormalities: Secondary | ICD-10-CM

## 2015-01-17 DIAGNOSIS — Z3481 Encounter for supervision of other normal pregnancy, first trimester: Secondary | ICD-10-CM | POA: Diagnosis present

## 2015-01-25 ENCOUNTER — Other Ambulatory Visit: Payer: Self-pay | Admitting: Certified Nurse Midwife

## 2015-01-26 ENCOUNTER — Telehealth: Payer: Self-pay | Admitting: *Deleted

## 2015-01-26 NOTE — Telephone Encounter (Signed)
Patient is c/o insomnia and bloating and wants to know if there is anything that she can take. 3:46 Call to patient- she has not tried anything over the counter yet- suggested that she try Tums, Maalox for the bloating. Also can try Tylenol PM at night for occasional insomnia.

## 2015-01-31 ENCOUNTER — Ambulatory Visit (HOSPITAL_COMMUNITY)
Admission: RE | Admit: 2015-01-31 | Discharge: 2015-01-31 | Disposition: A | Discharge status: Home / Self Care | Payer: Medicaid Other | Source: Ambulatory Visit | Attending: Obstetrics | Admitting: Obstetrics

## 2015-01-31 ENCOUNTER — Encounter (HOSPITAL_COMMUNITY): Payer: Self-pay

## 2015-01-31 VITALS — BP 102/54 | HR 77 | Wt 136.4 lb

## 2015-01-31 DIAGNOSIS — Z3682 Encounter for antenatal screening for nuchal translucency: Secondary | ICD-10-CM

## 2015-01-31 DIAGNOSIS — Z8279 Family history of other congenital malformations, deformations and chromosomal abnormalities: Secondary | ICD-10-CM

## 2015-01-31 DIAGNOSIS — Z3A13 13 weeks gestation of pregnancy: Secondary | ICD-10-CM

## 2015-01-31 DIAGNOSIS — Z3A18 18 weeks gestation of pregnancy: Secondary | ICD-10-CM

## 2015-01-31 DIAGNOSIS — Z36 Encounter for antenatal screening of mother: Secondary | ICD-10-CM | POA: Diagnosis present

## 2015-02-01 ENCOUNTER — Other Ambulatory Visit (HOSPITAL_COMMUNITY): Payer: Self-pay | Admitting: *Deleted

## 2015-02-01 DIAGNOSIS — Z3A18 18 weeks gestation of pregnancy: Secondary | ICD-10-CM

## 2015-02-02 ENCOUNTER — Encounter (HOSPITAL_COMMUNITY): Payer: Self-pay

## 2015-02-02 DIAGNOSIS — Z8279 Family history of other congenital malformations, deformations and chromosomal abnormalities: Secondary | ICD-10-CM | POA: Insufficient documentation

## 2015-02-02 NOTE — Progress Notes (Signed)
Genetic Counseling  High-Risk Gestation Note  Appointment Date:  02/02/2015 Referred By: Shelly Bombard, MD Date of Birth:  07/27/1985   Pregnancy History: [id number] Estimated Date of Delivery: 08/05/15 Estimated Gestational Age: 31w3dAttending: JJolyn Lent MD   I met with Ms. Lydia [name] for genetic counseling because of a previous daughter with limb reduction defect. [employer] Genetic Counseling Intern, CElouise Munroe assisted with genetic counseling under my direct supervision.   In Summary:   Couple's daughter was born with unilateral limb reduction defect, below the forearm and duplicated ureter  She was reportedly evaluated by genetics and etiology determined most likely to be sporadic  Reviewed various etiologies for limb reduction defect  Extremities visualized on today's ultrasound; Detailed ultrasound scheduled for 03/07/15.   We began by reviewing the family history in detail. Ms. GPerkinreported that her daughter, LShona Simpson was found prenatally to have transverse deformity of left forearm, causing short left forearm, and absent left wrist and hand.  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 The patient reported that her daughter is currently 465years old and is otherwise healthy. No underlying cause was reportedly determined for her features. [facility] and her features were reportedly not suggestive of an underlying genetic etiology.   We reviewed various etiologies for limb reduction defects including sporadic, environmental, genetic, and multifactorial. Given that the patient's daughter's limb reduction was reportedly not suggestive of an underlying genetic etiology, and Ms. GHibnerreported no known exposures in that pregnancy, sporadic occurrence is most likely. We reviewed that recurrence risk for siblings would likely be low, but possibly increased above the general population  risk. We discussed the option of ultrasound in pregnancy to assess upper and lower extremity development. Ultrasound was performed today and all extremities were visualized. Detailed ultrasound is scheduled in the second trimester.   The family histories were otherwise found to be noncontributory for birth defects, intellectual disability, and known genetic conditions. Without further information regarding the provided family history, an accurate genetic risk cannot be calculated. Further genetic counseling is warranted if more information is obtained.  We discussed the screening option of first trimester screening.   She understands that screening tests are used to modify a patient's a priori risk for aneuploidy, typically based on age.  This estimate provides a pregnancy specific risk assessment.  We reviewed the limitations and benefits of screening including the conditions for which it assesses, detection rates, and false positive rates.  Ms. GCalixtoelected to have first trimester screening today. Detailed ultrasound is scheduled for 03/07/15.   Ms. ASAMYIA MOTTERwas provided with written information regarding cystic fibrosis (CF) including the carrier frequency and incidence in the Caucasian population, the availability of carrier testing and prenatal diagnosis if indicated.  In addition, we discussed that CF is routinely screened for as part of the [place] newborn screening panel.  She declined CF testing today.   Ms. ALAASIA ARCOSdenied exposure to environmental toxins or chemical agents. She denied the use of alcohol, tobacco or street drugs. She denied significant viral illnesses during the course of her pregnancy. Her medical and surgical histories were noncontributory.   I counseled Ms. Kealie A Bergey regarding the above risks and available options.  The approximate face-to-face time with the genetic counselor was 30 minutes.  KChipper Oman MS Certified Genetic Counselor 02/02/2015

## 2015-02-08 ENCOUNTER — Encounter: Payer: Self-pay | Admitting: Obstetrics

## 2015-02-08 ENCOUNTER — Ambulatory Visit (INDEPENDENT_AMBULATORY_CARE_PROVIDER_SITE_OTHER): Payer: Medicaid Other | Admitting: Obstetrics

## 2015-02-08 VITALS — BP 100/62 | HR 82 | Temp 98.5°F | Wt 139.0 lb

## 2015-02-08 DIAGNOSIS — K219 Gastro-esophageal reflux disease without esophagitis: Secondary | ICD-10-CM

## 2015-02-08 DIAGNOSIS — O0942 Supervision of pregnancy with grand multiparity, second trimester: Secondary | ICD-10-CM

## 2015-02-08 DIAGNOSIS — O219 Vomiting of pregnancy, unspecified: Secondary | ICD-10-CM

## 2015-02-08 LAB — POCT URINALYSIS DIPSTICK
Bilirubin, UA: NEGATIVE
Glucose, UA: NEGATIVE
Ketones, UA: NEGATIVE
NITRITE UA: NEGATIVE
PH UA: 5
Protein, UA: NEGATIVE
RBC UA: NEGATIVE
Spec Grav, UA: 1.02
UROBILINOGEN UA: NEGATIVE

## 2015-02-08 MED ORDER — METOCLOPRAMIDE HCL 10 MG PO TABS: 10.0000 mg | ORAL_TABLET | Freq: Three times a day (TID) | ORAL | Status: DC | PRN

## 2015-02-08 MED ORDER — RANITIDINE HCL 150 MG PO TABS: 150.0000 mg | ORAL_TABLET | Freq: Two times a day (BID) | ORAL | Status: DC

## 2015-02-08 NOTE — Progress Notes (Signed)
Patient reports no concerns today 

## 2015-02-08 NOTE — Addendum Note (Signed)
Addended by: Lewie Loron D on: 02/08/2015 02:12 PM   Modules accepted: Orders

## 2015-02-08 NOTE — Progress Notes (Signed)
  Subjective:    Lydia [name] is a 29 y.o. female being seen today for her obstetrical visit. She is at [redacted]w[redacted]d gestation. Patient reports: heartburn and nausea.  Problem List Items Addressed This Visit    None    Visit Diagnoses    Supervision of pregnancy with grand multiparity in second trimester    -  Primary    Relevant Orders    POCT urinalysis dipstick      Patient Active Problem List   Diagnosis Date Noted  . Family history of congenital anomalies   . Dyspareunia, female 03/11/2014  . Acute recurrent maxillary sinusitis 03/11/2014  . Abdominal pain, epigastric 01/26/2014  . Tinea versicolor 12/08/2013  . Pain 12/08/2013  . Unspecified inflammatory disease of female pelvic organs and tissues 12/08/2013  . Pelvic pain in female 07/06/2013  . Dysmenorrhea 10/21/2012  . Unspecified symptom associated with female genital organs 10/07/2012  . Follicular cyst of ovary 10/07/2012  . Other and unspecified ovarian cyst 10/07/2012  . 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 HEADACHE 11/30/2007    Objective:     BP 100/62 mmHg  Pulse 82  Temp(Src) 98.5 F (36.9 C)  Wt 139 lb (63.05 kg)  LMP 10/29/2014 (Approximate) Uterine Size: Below umbilicus     Assessment:    Pregnancy @ [redacted]w[redacted]d  weeks Doing well    Plan:    Problem list reviewed and updated. Labs reviewed.  Follow up in 4 weeks. FIRST/CF mutation testing/NIPT/QUAD SCREEN/fragile X/[name] population testing/Spinal muscular atrophy discussed: requested. Role of ultrasound in pregnancy discussed; fetal survey: requested. Amniocentesis discussed: not indicated.

## 2015-02-09 LAB — AFP, QUAD SCREEN
AFP: 17.2 ng/mL
Age Alone: 1:815 {titer}
Curr Gest Age: 14.4 wks.days
HCG, Total: 65.99 IU/mL
INH: 221.8 pg/mL
INTERPRETATION-AFP: NEGATIVE
MOM FOR INH: 1.37
MoM for AFP: 0.66
MoM for hCG: 1.34
OPEN SPINA BIFIDA: NEGATIVE
TRI 18 SCR RISK EST: NEGATIVE
Trisomy 18 (Edward) Syndrome Interp.: 1:9940 {titer}
uE3 Mom: 0.6
uE3 Value: 0.43 ng/mL

## 2015-02-10 ENCOUNTER — Other Ambulatory Visit (HOSPITAL_COMMUNITY): Payer: Self-pay | Admitting: Obstetrics

## 2015-02-16 ENCOUNTER — Inpatient Hospital Stay (HOSPITAL_COMMUNITY)
Admission: AD | Admit: 2015-02-16 | Discharge: 2015-02-16 | Disposition: A | Discharge status: Home / Self Care | Payer: Medicaid Other | Source: Ambulatory Visit | Attending: Obstetrics | Admitting: Obstetrics

## 2015-02-16 ENCOUNTER — Encounter (HOSPITAL_COMMUNITY): Payer: Self-pay | Admitting: *Deleted

## 2015-02-16 DIAGNOSIS — O26892 Other specified pregnancy related conditions, second trimester: Secondary | ICD-10-CM | POA: Insufficient documentation

## 2015-02-16 DIAGNOSIS — R102 Pelvic and perineal pain: Secondary | ICD-10-CM | POA: Insufficient documentation

## 2015-02-16 DIAGNOSIS — R109 Unspecified abdominal pain: Secondary | ICD-10-CM

## 2015-02-16 DIAGNOSIS — R1031 Right lower quadrant pain: Secondary | ICD-10-CM | POA: Diagnosis present

## 2015-02-16 DIAGNOSIS — Z3A15 15 weeks gestation of pregnancy: Secondary | ICD-10-CM | POA: Diagnosis not present

## 2015-02-16 DIAGNOSIS — O9989 Other specified diseases and conditions complicating pregnancy, childbirth and the puerperium: Secondary | ICD-10-CM | POA: Diagnosis not present

## 2015-02-16 DIAGNOSIS — Z880 Allergy status to penicillin: Secondary | ICD-10-CM | POA: Insufficient documentation

## 2015-02-16 DIAGNOSIS — O26899 Other specified pregnancy related conditions, unspecified trimester: Secondary | ICD-10-CM

## 2015-02-16 HISTORY — DX: Unspecified infectious disease: B99.9

## 2015-02-16 HISTORY — DX: Unspecified ovarian cyst, unspecified side: N83.209

## 2015-02-16 LAB — COMPREHENSIVE METABOLIC PANEL
ALT: 9 U/L — AB (ref 14–54)
AST: 17 U/L (ref 15–41)
Albumin: 3.7 g/dL (ref 3.5–5.0)
Alkaline Phosphatase: 39 U/L (ref 38–126)
Anion gap: 5 (ref 5–15)
BUN: 7 mg/dL (ref 6–20)
CHLORIDE: 103 mmol/L (ref 101–111)
CO2: 27 mmol/L (ref 22–32)
Calcium: 9.3 mg/dL (ref 8.9–10.3)
Creatinine, Ser: 0.42 mg/dL — ABNORMAL LOW (ref 0.44–1.00)
GFR calc Af Amer: >60 mL/min (ref >60)
Glucose, Bld: 113 mg/dL — ABNORMAL HIGH (ref 65–99)
POTASSIUM: 3.7 mmol/L (ref 3.5–5.1)
SODIUM: 135 mmol/L (ref 135–145)
Total Bilirubin: 0.7 mg/dL (ref 0.3–1.2)
Total Protein: 7.2 g/dL (ref 6.5–8.1)

## 2015-02-16 LAB — URINALYSIS, ROUTINE W REFLEX MICROSCOPIC
BILIRUBIN URINE: NEGATIVE
GLUCOSE, UA: NEGATIVE mg/dL
HGB URINE DIPSTICK: NEGATIVE
KETONES UR: NEGATIVE mg/dL
Leukocytes, UA: NEGATIVE
NITRITE: NEGATIVE
Protein, ur: NEGATIVE mg/dL
Specific Gravity, Urine: 1.01 (ref 1.005–1.030)
Urobilinogen, UA: 1 mg/dL (ref 0.0–1.0)
pH: 7.5 (ref 5.0–8.0)

## 2015-02-16 LAB — CBC
HCT: 31.8 % — ABNORMAL LOW (ref 36.0–46.0)
Hemoglobin: 10.6 g/dL — ABNORMAL LOW (ref 12.0–15.0)
MCH: 29.8 pg (ref 26.0–34.0)
MCHC: 33.3 g/dL (ref 30.0–36.0)
MCV: 89.3 fL (ref 78.0–100.0)
PLATELETS: 256 10*3/uL (ref 150–400)
RBC: 3.56 MIL/uL — AB (ref 3.87–5.11)
RDW: 13.4 % (ref 11.5–15.5)
WBC: 10.7 10*3/uL — AB (ref 4.0–10.5)

## 2015-02-16 MED ORDER — ACETAMINOPHEN 500 MG PO TABS
1000.0000 mg | ORAL_TABLET | Freq: Once | ORAL | Status: AC
  Administered 2015-02-16: 1000 mg via ORAL
  Filled 2015-02-16: qty 2

## 2015-02-16 MED ORDER — OXYCODONE-ACETAMINOPHEN 5-325 MG PO TABS: 1.0000 | ORAL_TABLET | Freq: Four times a day (QID) | ORAL | Status: DC | PRN

## 2015-02-16 NOTE — Discharge Instructions (Signed)
Kidney Stones  Kidney stones (urolithiasis) are solid masses that form inside your kidneys. The intense pain is caused by the stone moving through the kidney, ureter, bladder, and urethra (urinary tract). When the stone moves, the ureter starts to spasm around the stone. The stone is usually passed in your pee (urine).   HOME CARE  · Drink enough fluids to keep your pee clear or pale yellow. This helps to get the stone out.  · Take a 24-hour pee (urine) sample as told by your doctor. You may need to take another sample every 6-12 months.  · Strain all pee through the provided strainer. Do not pee without peeing through the strainer, not even once. If you pee the stone out, catch it in the strainer. The stone may be as small as a grain of salt. Take this to your doctor. This will help your doctor figure out what you can do to try to prevent more kidney stones.  · Only take medicine as told by your doctor.  · Make changes to your daily diet as told by your doctor. You may be told to:    Limit how much salt you eat.    Eat 5 or more servings of fruits and vegetables each day.    Limit how much meat, poultry, fish, and eggs you eat.  · Keep all follow-up visits as told by your doctor. This is important.  · Get follow-up X-rays as told by your doctor.  GET HELP IF:  You have pain that gets worse even if you have been taking pain medicine.  GET HELP RIGHT AWAY IF:   · Your pain does not get better with medicine.  · You have a fever or shaking chills.  · Your pain increases and gets worse over 18 hours.  · You have new belly (abdominal) pain.  · You feel faint or pass out.  · You are unable to pee.     This information is not intended to replace advice given to you by your health care provider. Make sure you discuss any questions you have with your health care provider.     Document Released: 10/16/2007 Document Revised: 01/18/2015 Document Reviewed: 09/30/2012  Elsevier Interactive Patient Education ©2016 [employer]  Inc[employer]

## 2015-02-16 NOTE — MAU Note (Addendum)
Started yesterday, called Dr- no response. Bad enough was unable to sleep. Continues today.  Neg CVA tenderness

## 2015-02-16 NOTE — MAU Note (Signed)
Pt presents to MAU with complaints of pain in her right lower side since last night and she is not able to sleep, Denies any vaginal bleeding or discharge

## 2015-02-16 NOTE — MAU Provider Note (Signed)
History     CSN: [id number]  Arrival date and time: 02/16/15 1245   First Provider Initiated Contact with Patient 02/16/15 1314      No chief complaint on file.  HPI  Ms. Lydia [name] is a 29 y.o. [id number] at [redacted]w[redacted]d who presents to MAU today with complaint of RLQ abdominal pain since yesterday morning. The patient states that pain radiates into her right flank. She denies vaginal bleeding, abnormal discharge or LOF. She did have mild dysuria yesterday, but denies hematuria or dysuria today. She denies fever. She does state that pain is worse with ambulation although present constantly. She took Tylenol yesterday with minimal relief. She states pain is moderate and she has had difficulty sleeping.   OB History    Gravida Para Term Preterm AB TAB SAB Ectopic Multiple Living   4 2 2  0 1 0 1 0 0 2      Past Medical History  Diagnosis Date  . Chronic headaches     none x 2-3 yrs  . Meningitis   . Infection     UTI  . Ovarian cyst     Past Surgical History  Procedure Laterality Date  . Cholecystectomy      Family History  Problem Relation Age of Onset  . Hypertension Mother   . Breast cancer Maternal Aunt   . Birth defects Daughter     Limb reduction defect    Social History  Substance Use Topics  . Smoking status: Never Smoker   . Smokeless tobacco: Never Used  . Alcohol Use: No    Allergies:  Allergies  Allergen Reactions  . Penicillins Hives    Prescriptions prior to admission  Medication Sig Dispense Refill Last Dose  . acetaminophen (TYLENOL) 325 MG tablet Take 650 mg by mouth every 6 (six) hours as needed.   02/15/2015 at Unknown time  . docusate sodium (COLACE) 100 MG capsule Take 1 capsule (100 mg total) by mouth 2 (two) times daily. 60 capsule 1 Past Week at Unknown time  . metoCLOPramide (REGLAN) 10 MG tablet Take 1 tablet (10 mg total) by mouth every 8 (eight) hours as needed for nausea. 90 tablet 2 Past Month at Unknown time  . Prenat w/o  A-FeCbn-Meth-FA-DHA (OB COMPLETE GOLD) 27.5-1-200 MG CAPS Take 1 capsule by mouth daily. 30 capsule 11 02/15/2015 at Unknown time  . ranitidine (ZANTAC) 150 MG tablet Take 1 tablet (150 mg total) by mouth 2 (two) times daily. 60 tablet 5 02/15/2015 at Unknown time    Review of Systems  Constitutional: Negative for fever and malaise/fatigue.  Gastrointestinal: Positive for nausea and abdominal pain. Negative for vomiting, diarrhea and constipation.  Genitourinary: Positive for dysuria and flank pain. Negative for urgency, frequency and hematuria.       Neg - vaginal bleeding, discharge, LOF   Physical Exam   Blood pressure 99/58, pulse 81, temperature 98.2 F (36.8 C), temperature source Oral, resp. rate 16, last menstrual period 10/29/2014.  Physical Exam  Nursing note and vitals reviewed. Constitutional: She is oriented to person, place, and time. She appears well-developed and well-nourished. No distress.  HENT:  Head: Normocephalic and atraumatic.  Cardiovascular: Normal rate.   Respiratory: Effort normal.  GI: Soft. Bowel sounds are normal. She exhibits no distension and no mass. There is tenderness (mild tenderness to palpation of the RLQ). There is no rebound, no guarding and no CVA tenderness.  Neurological: She is alert and oriented to person, place, and time.  Skin:  Skin is warm and dry. No erythema.  Psychiatric: She has a normal mood and affect.    Results for orders placed or performed during the hospital encounter of 02/16/15 (from the past 24 hour(s))  Urinalysis, Routine w reflex microscopic (not at [facility])     Status: None   Collection Time: 02/16/15  1:00 PM  Result Value Ref Range   Color, Urine YELLOW YELLOW   APPearance CLEAR CLEAR   Specific Gravity, Urine 1.010 1.005 - 1.030   pH 7.5 5.0 - 8.0   Glucose, UA NEGATIVE NEGATIVE mg/dL   Hgb urine dipstick NEGATIVE NEGATIVE   Bilirubin Urine NEGATIVE NEGATIVE   Ketones, ur NEGATIVE NEGATIVE mg/dL   Protein, ur  NEGATIVE NEGATIVE mg/dL   Urobilinogen, UA 1.0 0.0 - 1.0 mg/dL   Nitrite NEGATIVE NEGATIVE   Leukocytes, UA NEGATIVE NEGATIVE  CBC     Status: Abnormal   Collection Time: 02/16/15  1:30 PM  Result Value Ref Range   WBC 10.7 (H) 4.0 - 10.5 K/uL   RBC 3.56 (L) 3.87 - 5.11 MIL/uL   Hemoglobin 10.6 (L) 12.0 - 15.0 g/dL   HCT 31.8 (L) 36.0 - 46.0 %   MCV 89.3 78.0 - 100.0 fL   MCH 29.8 26.0 - 34.0 pg   MCHC 33.3 30.0 - 36.0 g/dL   RDW 13.4 11.5 - 15.5 %   Platelets 256 150 - 400 K/uL  Comprehensive metabolic panel     Status: Abnormal   Collection Time: 02/16/15  1:30 PM  Result Value Ref Range   Sodium 135 135 - 145 mmol/L   Potassium 3.7 3.5 - 5.1 mmol/L   Chloride 103 101 - 111 mmol/L   CO2 27 22 - 32 mmol/L   Glucose, Bld 113 (H) 65 - 99 mg/dL   BUN 7 6 - 20 mg/dL   Creatinine, Ser 0.42 (L) 0.44 - 1.00 mg/dL   Calcium 9.3 8.9 - 10.3 mg/dL   Total Protein 7.2 6.5 - 8.1 g/dL   Albumin 3.7 3.5 - 5.0 g/dL   AST 17 15 - 41 U/L   ALT 9 (L) 14 - 54 U/L   Alkaline Phosphatase 39 38 - 126 U/L   Total Bilirubin 0.7 0.3 - 1.2 mg/dL   GFR calc non Af Amer >60 >60 mL/min   GFR calc Af Amer >60 >60 mL/min   Anion gap 5 5 - 15    MAU Course  Procedures None  MDM FHR - 134 bpm with doppler UA, CBC and CMP today 1000 mg Tylenol given Patient offered option of Renal US to confirm kidney stone although advised that it would not change management at this time Patient initially stated that she did want [place] and then declined a few minutes later  Assessment and Plan  A: SIUP at [redacted]w[redacted]d Round ligament pain vs possible kidney stone  P: Discharge home Rx for Percocet given to patient Second trimester precautions discussed Patient advised to increase PO hydration as tolerated Patient advised to follow-up with Dr. Jodi Mourning as scheduled or sooner PRN Patient may return to MAU as needed or if her condition were to change or worsen   Luvenia Redden, PA-C  02/16/2015, 2:20 PM

## 2015-02-22 ENCOUNTER — Encounter: Payer: Medicaid Other | Admitting: Obstetrics

## 2015-02-22 ENCOUNTER — Encounter: Payer: Self-pay | Admitting: Obstetrics

## 2015-02-22 ENCOUNTER — Ambulatory Visit (INDEPENDENT_AMBULATORY_CARE_PROVIDER_SITE_OTHER): Payer: Medicaid Other | Admitting: Obstetrics

## 2015-02-22 VITALS — BP 99/59 | HR 86 | Wt 139.0 lb

## 2015-02-22 DIAGNOSIS — Z3482 Encounter for supervision of other normal pregnancy, second trimester: Secondary | ICD-10-CM

## 2015-02-22 DIAGNOSIS — O219 Vomiting of pregnancy, unspecified: Secondary | ICD-10-CM

## 2015-02-22 LAB — POCT URINALYSIS DIPSTICK
Bilirubin, UA: NEGATIVE
Glucose, UA: NEGATIVE
KETONES UA: NEGATIVE
LEUKOCYTES UA: NEGATIVE
Nitrite, UA: NEGATIVE
PH UA: 5
PROTEIN UA: NEGATIVE
RBC UA: NEGATIVE
Spec Grav, UA: 1.02
Urobilinogen, UA: NEGATIVE

## 2015-02-22 NOTE — Progress Notes (Signed)
  Subjective:    Lydia [name] is a 29 y.o. female being seen today for her obstetrical visit. She is at [redacted]w[redacted]d gestation. Patient reports: no complaints.  Problem List Items Addressed This Visit    None     Patient Active Problem List   Diagnosis Date Noted  . Family history of congenital anomalies   . Dyspareunia, female 03/11/2014  . Acute recurrent maxillary sinusitis 03/11/2014  . Abdominal pain, epigastric 01/26/2014  . Tinea versicolor 12/08/2013  . Pain 12/08/2013  . Unspecified inflammatory disease of female pelvic organs and tissues 12/08/2013  . Pelvic pain in female 07/06/2013  . Dysmenorrhea 10/21/2012  . Unspecified symptom associated with female genital organs 10/07/2012  . Follicular cyst of ovary 10/07/2012  . Other and unspecified ovarian cyst 10/07/2012  . 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 HEADACHE 11/30/2007    Objective:     BP 99/59 mmHg  Pulse 86  Wt 139 lb (63.05 kg)  LMP 10/29/2014 (Approximate) Uterine Size: Below umbilicus     Assessment:    Pregnancy @ [redacted]w[redacted]d  weeks Doing well    Plan:    Problem list reviewed and updated. Labs reviewed.  Follow up in 4 weeks. FIRST/CF mutation testing/NIPT/QUAD SCREEN/fragile X/[name] population testing/Spinal muscular atrophy discussed: requested. Role of ultrasound in pregnancy discussed; fetal survey: requested. Amniocentesis discussed: not indicated.

## 2015-02-24 ENCOUNTER — Telehealth: Payer: Self-pay | Admitting: *Deleted

## 2015-02-24 NOTE — Telephone Encounter (Signed)
Patient states she was in the office this week and her blood pressure was slightly low. Patient states on 02-23-15 she was going to faint. Patient states she felt her heart racing and her ears started ringing. Patient states she felt like she was about to black out. Patient states the symptoms got better after she ate. Patient wants to know what she should do.

## 2015-02-28 NOTE — Telephone Encounter (Signed)
Patient advised of recommendations and verbalized understanding.  

## 2015-02-28 NOTE — Telephone Encounter (Signed)
She should go to Triage when she feel extremely dizzy and it doesn't resolve quickly, but mild transient dizziness is normal.

## 2015-03-02 ENCOUNTER — Encounter: Payer: Medicaid Other | Admitting: Obstetrics

## 2015-03-07 ENCOUNTER — Ambulatory Visit (HOSPITAL_COMMUNITY)
Admission: RE | Admit: 2015-03-07 | Discharge: 2015-03-07 | Disposition: A | Discharge status: Home / Self Care | Payer: Medicaid Other | Source: Ambulatory Visit | Attending: Obstetrics | Admitting: Obstetrics

## 2015-03-07 DIAGNOSIS — Z3A18 18 weeks gestation of pregnancy: Secondary | ICD-10-CM | POA: Insufficient documentation

## 2015-03-07 DIAGNOSIS — Z36 Encounter for antenatal screening of mother: Secondary | ICD-10-CM | POA: Insufficient documentation

## 2015-03-09 ENCOUNTER — Ambulatory Visit: Payer: Self-pay | Admitting: Obstetrics

## 2015-03-22 ENCOUNTER — Ambulatory Visit (INDEPENDENT_AMBULATORY_CARE_PROVIDER_SITE_OTHER): Payer: Medicaid Other | Admitting: Obstetrics

## 2015-03-22 ENCOUNTER — Encounter: Payer: Self-pay | Admitting: Obstetrics

## 2015-03-22 VITALS — BP 91/61 | HR 80 | Temp 98.1°F | Wt 147.0 lb

## 2015-03-22 DIAGNOSIS — Z3482 Encounter for supervision of other normal pregnancy, second trimester: Secondary | ICD-10-CM

## 2015-03-22 DIAGNOSIS — F419 Anxiety disorder, unspecified: Secondary | ICD-10-CM

## 2015-03-22 LAB — POCT URINALYSIS DIPSTICK
Bilirubin, UA: NEGATIVE
Blood, UA: NEGATIVE
Glucose, UA: NEGATIVE
KETONES UA: NEGATIVE
LEUKOCYTES UA: NEGATIVE
NITRITE UA: NEGATIVE
PH UA: 5
PROTEIN UA: NEGATIVE
Spec Grav, UA: 1.025
Urobilinogen, UA: NEGATIVE

## 2015-03-22 MED ORDER — HYDROXYZINE HCL 25 MG PO TABS: 25.0000 mg | ORAL_TABLET | Freq: Two times a day (BID) | ORAL | Status: DC | PRN

## 2015-03-22 NOTE — Progress Notes (Addendum)
Subjective:    Lydia [name] is a 29 y.o. female being seen today for her obstetrical visit. She is at [redacted]w[redacted]d gestation. Patient reports: no complaints . Fetal movement: normal.  Problem List Items Addressed This Visit    None    Visit Diagnoses    Supervision of other normal pregnancy, antepartum, second trimester    -  Primary    Relevant Orders    POCT urinalysis dipstick    Anxiety        Vistaril Rx    Relevant Medications    hydrOXYzine (ATARAX/VISTARIL) 25 MG tablet      Patient Active Problem List   Diagnosis Date Noted  . Family history of congenital anomalies   . Dyspareunia, female 03/11/2014  . Acute recurrent maxillary sinusitis 03/11/2014  . Abdominal pain, epigastric 01/26/2014  . Tinea versicolor 12/08/2013  . Pain 12/08/2013  . Unspecified inflammatory disease of female pelvic organs and tissues 12/08/2013  . Pelvic pain in female 07/06/2013  . Dysmenorrhea 10/21/2012  . Unspecified symptom associated with female genital organs 10/07/2012  . Follicular cyst of ovary 10/07/2012  . Other and unspecified ovarian cyst 10/07/2012  . 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 HEADACHE 11/30/2007   Objective:    BP 91/61 mmHg  Pulse 80  Temp(Src) 98.1 F (36.7 C)  Wt 147 lb (66.679 kg)  LMP 10/29/2014 (Approximate) FHT: 150 BPM  Uterine Size: size equals dates     Assessment:    Pregnancy @ [redacted]w[redacted]d     Anxiety  Plan:   Vistaril Rx for anxiety   Labs, problem list reviewed and updated 2 hr GTT planned Follow up in 4 weeks.

## 2015-04-14 ENCOUNTER — Other Ambulatory Visit: Payer: Self-pay | Admitting: Certified Nurse Midwife

## 2015-04-19 ENCOUNTER — Ambulatory Visit (INDEPENDENT_AMBULATORY_CARE_PROVIDER_SITE_OTHER): Payer: Medicaid Other | Admitting: Obstetrics

## 2015-04-19 ENCOUNTER — Encounter: Payer: Self-pay | Admitting: Obstetrics

## 2015-04-19 VITALS — BP 94/64 | HR 89 | Temp 97.9°F | Wt 150.0 lb

## 2015-04-19 DIAGNOSIS — Z3482 Encounter for supervision of other normal pregnancy, second trimester: Secondary | ICD-10-CM

## 2015-04-19 LAB — POCT URINALYSIS DIPSTICK
Bilirubin, UA: NEGATIVE
Blood, UA: NEGATIVE
GLUCOSE UA: NEGATIVE
Ketones, UA: NEGATIVE
LEUKOCYTES UA: NEGATIVE
NITRITE UA: NEGATIVE
PROTEIN UA: NEGATIVE
Spec Grav, UA: 1.015
UROBILINOGEN UA: NEGATIVE
pH, UA: 7

## 2015-04-19 NOTE — Progress Notes (Signed)
Subjective:    Lydia [name] is a 29 y.o. female being seen today for her obstetrical visit. She is at [redacted]w[redacted]d gestation. Patient reports: no complaints . Fetal movement: normal.  Problem List Items Addressed This Visit    None    Visit Diagnoses    Supervision of other normal pregnancy, antepartum, second trimester    -  Primary    Relevant Orders    POCT urinalysis dipstick      Patient Active Problem List   Diagnosis Date Noted  . Family history of congenital anomalies   . Dyspareunia, female 03/11/2014  . Acute recurrent maxillary sinusitis 03/11/2014  . Abdominal pain, epigastric 01/26/2014  . Tinea versicolor 12/08/2013  . Pain 12/08/2013  . Unspecified inflammatory disease of female pelvic organs and tissues 12/08/2013  . Pelvic pain in female 07/06/2013  . Dysmenorrhea 10/21/2012  . Unspecified symptom associated with female genital organs 10/07/2012  . Follicular cyst of ovary 10/07/2012  . Other and unspecified ovarian cyst 10/07/2012  . 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 HEADACHE 11/30/2007   Objective:    BP 94/64 mmHg  Pulse 89  Temp(Src) 97.9 F (36.6 C)  Wt 150 lb (68.04 kg)  LMP 10/29/2014 (Approximate) FHT: 150 BPM  Uterine Size: size equals dates     Assessment:    Pregnancy @ [redacted]w[redacted]d    Plan:    OBGCT: ordered for next visit.  Labs, problem list reviewed and updated 2 hr GTT planned Follow up in 4 weeks.

## 2015-05-14 NOTE — L&D Delivery Note (Signed)
Delivery Note At 7:48 AM a viable female was delivered via Vaginal, Spontaneous Delivery (Presentation: Right Occiput Anterior).  APGAR: 9, 9; weight: 3671 grams  .   Placenta status: Intact, Spontaneous.  Cord: 3 vessels with the following complications: None.  Cord pH: none  Anesthesia: Epidural  Episiotomy: None Lacerations: None Suture Repair: none Est. Blood Loss (mL): 350  Mom to postpartum.  Baby to Couplet care / Skin to Skin.  Alva Broxson A 08/03/2015, 8:12 AM

## 2015-05-17 ENCOUNTER — Encounter: Payer: Self-pay | Admitting: Obstetrics

## 2015-05-17 ENCOUNTER — Ambulatory Visit (INDEPENDENT_AMBULATORY_CARE_PROVIDER_SITE_OTHER): Payer: Medicaid Other | Admitting: Obstetrics

## 2015-05-17 ENCOUNTER — Other Ambulatory Visit: Payer: Medicaid Other

## 2015-05-17 VITALS — BP 92/65 | HR 99 | Temp 98.0°F | Wt 156.0 lb

## 2015-05-17 DIAGNOSIS — Z3483 Encounter for supervision of other normal pregnancy, third trimester: Secondary | ICD-10-CM

## 2015-05-17 LAB — CBC
HEMATOCRIT: 32.6 % — AB (ref 36.0–46.0)
HEMOGLOBIN: 11.2 g/dL — AB (ref 12.0–15.0)
MCH: 31.1 pg (ref 26.0–34.0)
MCHC: 34.4 g/dL (ref 30.0–36.0)
MCV: 90.6 fL (ref 78.0–100.0)
MPV: 8.9 fL (ref 8.6–12.4)
Platelets: 276 10*3/uL (ref 150–400)
RBC: 3.6 MIL/uL — ABNORMAL LOW (ref 3.87–5.11)
RDW: 13.6 % (ref 11.5–15.5)
WBC: 12.7 10*3/uL — AB (ref 4.0–10.5)

## 2015-05-17 LAB — POCT URINALYSIS DIPSTICK
Bilirubin, UA: NEGATIVE
Blood, UA: NEGATIVE
GLUCOSE UA: NEGATIVE
Ketones, UA: NEGATIVE
Leukocytes, UA: NEGATIVE
Nitrite, UA: NEGATIVE
PROTEIN UA: NEGATIVE
Spec Grav, UA: 1.015
Urobilinogen, UA: NEGATIVE
pH, UA: 6

## 2015-05-17 NOTE — Progress Notes (Signed)
Patient states she is having increasing pain in her lower abdomen.

## 2015-05-17 NOTE — Progress Notes (Signed)
Subjective:    Lydia [name] is a 30 y.o. female being seen today for her obstetrical visit. She is at [redacted]w[redacted]d gestation. Patient reports: backache . Fetal movement: normal.  Problem List Items Addressed This Visit    None    Visit Diagnoses    Supervision of other normal pregnancy, antepartum, second trimester    -  Primary    Relevant Orders    POCT urinalysis dipstick (Completed)    Glucose Tolerance, 2 Hours w/1 Hour    CBC    HIV antibody    RPR      Patient Active Problem List   Diagnosis Date Noted  . Family history of congenital anomalies   . Dyspareunia, female 03/11/2014  . Acute recurrent maxillary sinusitis 03/11/2014  . Abdominal pain, epigastric 01/26/2014  . Tinea versicolor 12/08/2013  . Pain 12/08/2013  . Unspecified inflammatory disease of female pelvic organs and tissues 12/08/2013  . Pelvic pain in female 07/06/2013  . Dysmenorrhea 10/21/2012  . Unspecified symptom associated with female genital organs 10/07/2012  . Follicular cyst of ovary 10/07/2012  . Other and unspecified ovarian cyst 10/07/2012  . 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 HEADACHE 11/30/2007   Objective:    BP 92/65 mmHg  Pulse 99  Temp(Src) 98 F (36.7 C)  Wt 156 lb (70.761 kg)  LMP 10/29/2014 (Approximate) FHT: 150 BPM  Uterine Size: size equals dates     Assessment:    Pregnancy @ [redacted]w[redacted]d    Plan:    OBGCT: ordered. Signs and symptoms of preterm labor: discussed.  Labs, problem list reviewed and updated 2 hr GTT planned Follow up in 2 weeks.

## 2015-05-18 LAB — GLUCOSE TOLERANCE, 2 HOURS W/ 1HR
GLUCOSE, 2 HOUR: 86 mg/dL (ref 70–139)
GLUCOSE, FASTING: 70 mg/dL (ref 65–99)
Glucose, 1 hour: 104 mg/dL (ref 70–170)

## 2015-05-18 LAB — HIV ANTIBODY (ROUTINE TESTING W REFLEX): HIV 1&2 Ab, 4th Generation: NONREACTIVE

## 2015-05-20 LAB — RPR

## 2015-05-25 ENCOUNTER — Encounter: Payer: Self-pay | Admitting: Obstetrics

## 2015-05-25 ENCOUNTER — Ambulatory Visit (INDEPENDENT_AMBULATORY_CARE_PROVIDER_SITE_OTHER): Payer: Medicaid Other | Admitting: Obstetrics

## 2015-05-25 VITALS — BP 95/66 | HR 96 | Wt 154.0 lb

## 2015-05-25 DIAGNOSIS — R102 Pelvic and perineal pain: Secondary | ICD-10-CM

## 2015-05-25 DIAGNOSIS — O26893 Other specified pregnancy related conditions, third trimester: Secondary | ICD-10-CM

## 2015-05-25 LAB — POCT URINALYSIS DIPSTICK
Bilirubin, UA: NEGATIVE
Blood, UA: NEGATIVE
GLUCOSE UA: NEGATIVE
Ketones, UA: NEGATIVE
Leukocytes, UA: NEGATIVE
Nitrite, UA: NEGATIVE
Protein, UA: NEGATIVE
Spec Grav, UA: 1.015
UROBILINOGEN UA: NEGATIVE
pH, UA: 7

## 2015-05-25 MED ORDER — OXYCODONE-ACETAMINOPHEN 5-325 MG PO TABS: 1.0000 | ORAL_TABLET | ORAL | Status: DC | PRN

## 2015-05-25 NOTE — Progress Notes (Signed)
Subjective:    Bryttany A Humphrey is a 30 y.o. female being seen today for her obstetrical visit. She is at [redacted]w[redacted]d gestation. Patient reports backache and pressure. Fetal movement: normal.  Problem List Items Addressed This Visit    None    Visit Diagnoses    Pelvic pain affecting pregnancy in third trimester, antepartum    -  Primary    Relevant Medications    oxyCODONE-acetaminophen (PERCOCET/ROXICET) 5-325 MG tablet      Patient Active Problem List   Diagnosis Date Noted  . Family history of congenital anomalies   . Dyspareunia, female 03/11/2014  . Acute recurrent maxillary sinusitis 03/11/2014  . Abdominal pain, epigastric 01/26/2014  . Tinea versicolor 12/08/2013  . Pain 12/08/2013  . Unspecified inflammatory disease of female pelvic organs and tissues 12/08/2013  . Pelvic pain in female 07/06/2013  . Dysmenorrhea 10/21/2012  . Unspecified symptom associated with female genital organs 10/07/2012  . Follicular cyst of ovary 10/07/2012  . Other and unspecified ovarian cyst 10/07/2012  . 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 HEADACHE 11/30/2007   Objective:    BP 95/66 mmHg  Pulse 96  Wt 154 lb (69.854 kg)  LMP 10/29/2014 (Approximate) FHT:  140 BPM  Uterine Size: size equals dates  Presentation: unsure     Assessment:    Pregnancy @ [redacted]w[redacted]d weeks   Plan:     labs reviewed, problem list updated Consent signed. GBS sent TDAP offered  Rhogam given for RH negative Pediatrician: discussed. Infant feeding: plans to breastfeed. Maternity leave: discussed. Cigarette smoking: never smoked. No orders of the defined types were placed in this encounter.   Meds ordered this encounter  Medications  . oxyCODONE-acetaminophen (PERCOCET/ROXICET) 5-325 MG tablet    Sig: Take 1 tablet by mouth every 4 (four) hours as needed for moderate pain or severe pain.    Dispense:  40 tablet    Refill:  0   Follow up in 1 Week.

## 2015-05-25 NOTE — Addendum Note (Signed)
Addended by: Lewie Loron D on: 05/25/2015 05:27 PM   Modules accepted: Orders

## 2015-05-25 NOTE — Progress Notes (Signed)
Increase pain/pressure. Some ctx.

## 2015-05-31 ENCOUNTER — Ambulatory Visit (INDEPENDENT_AMBULATORY_CARE_PROVIDER_SITE_OTHER): Payer: Medicaid Other | Admitting: Obstetrics

## 2015-05-31 VITALS — BP 96/63 | HR 90 | Temp 97.7°F | Wt 156.0 lb

## 2015-05-31 DIAGNOSIS — Z3483 Encounter for supervision of other normal pregnancy, third trimester: Secondary | ICD-10-CM

## 2015-05-31 LAB — POCT URINALYSIS DIPSTICK
BILIRUBIN UA: NEGATIVE
Blood, UA: NEGATIVE
Glucose, UA: NEGATIVE
Ketones, UA: NEGATIVE
LEUKOCYTES UA: NEGATIVE
NITRITE UA: NEGATIVE
PH UA: 7
Protein, UA: NEGATIVE
Spec Grav, UA: 1.015
UROBILINOGEN UA: NEGATIVE

## 2015-06-01 ENCOUNTER — Encounter: Payer: Self-pay | Admitting: Obstetrics

## 2015-06-01 NOTE — Progress Notes (Signed)
Subjective:    Lydia [name] is a 30 y.o. female being seen today for her obstetrical visit. She is at [redacted]w[redacted]d gestation. Patient reports no complaints. Fetal movement: normal.  Problem List Items Addressed This Visit    None    Visit Diagnoses    Encounter for supervision of other normal pregnancy in third trimester    -  Primary    Relevant Orders    POCT urinalysis dipstick (Completed)      Patient Active Problem List   Diagnosis Date Noted  . Family history of congenital anomalies   . Dyspareunia, female 03/11/2014  . Acute recurrent maxillary sinusitis 03/11/2014  . Abdominal pain, epigastric 01/26/2014  . Tinea versicolor 12/08/2013  . Pain 12/08/2013  . Unspecified inflammatory disease of female pelvic organs and tissues 12/08/2013  . Pelvic pain in female 07/06/2013  . Dysmenorrhea 10/21/2012  . Unspecified symptom associated with female genital organs 10/07/2012  . Follicular cyst of ovary 10/07/2012  . Other and unspecified ovarian cyst 10/07/2012  . 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 HEADACHE 11/30/2007   Objective:    BP 96/63 mmHg  Pulse 90  Temp(Src) 97.7 F (36.5 C)  Wt 156 lb (70.761 kg)  LMP 10/29/2014 (Approximate) FHT:  140 BPM  Uterine Size: size equals dates  Presentation: unsure     Assessment:    Pregnancy @ [redacted]w[redacted]d weeks   Plan:     labs reviewed, problem list updated Consent signed. GBS sent TDAP offered  Rhogam given for RH negative Pediatrician: discussed. Infant feeding: plans to breastfeed. Maternity leave: discussed. Cigarette smoking: never smoked. Orders Placed This Encounter  Procedures  . POCT urinalysis dipstick   No orders of the defined types were placed in this encounter.   Follow up in 2 Weeks.

## 2015-06-04 ENCOUNTER — Inpatient Hospital Stay (HOSPITAL_COMMUNITY)
Admission: AD | Admit: 2015-06-04 | Discharge: 2015-06-04 | Disposition: A | Discharge status: Home / Self Care | Payer: Medicaid Other | Source: Ambulatory Visit | Attending: Obstetrics | Admitting: Obstetrics

## 2015-06-04 ENCOUNTER — Encounter (HOSPITAL_COMMUNITY): Payer: Self-pay | Admitting: *Deleted

## 2015-06-04 DIAGNOSIS — O26893 Other specified pregnancy related conditions, third trimester: Secondary | ICD-10-CM | POA: Insufficient documentation

## 2015-06-04 DIAGNOSIS — Z3A31 31 weeks gestation of pregnancy: Secondary | ICD-10-CM | POA: Insufficient documentation

## 2015-06-04 DIAGNOSIS — R55 Syncope and collapse: Secondary | ICD-10-CM | POA: Diagnosis not present

## 2015-06-04 DIAGNOSIS — O9989 Other specified diseases and conditions complicating pregnancy, childbirth and the puerperium: Secondary | ICD-10-CM

## 2015-06-04 DIAGNOSIS — Z880 Allergy status to penicillin: Secondary | ICD-10-CM | POA: Diagnosis not present

## 2015-06-04 DIAGNOSIS — R42 Dizziness and giddiness: Secondary | ICD-10-CM | POA: Diagnosis present

## 2015-06-04 HISTORY — DX: Anxiety disorder, unspecified: F41.9

## 2015-06-04 LAB — BASIC METABOLIC PANEL
Anion gap: 7 (ref 5–15)
BUN: 8 mg/dL (ref 6–20)
CHLORIDE: 103 mmol/L (ref 101–111)
CO2: 27 mmol/L (ref 22–32)
Calcium: 9.2 mg/dL (ref 8.9–10.3)
Creatinine, Ser: 0.42 mg/dL — ABNORMAL LOW (ref 0.44–1.00)
GFR calc non Af Amer: >60 mL/min (ref >60)
Glucose, Bld: 92 mg/dL (ref 65–99)
POTASSIUM: 3.4 mmol/L — AB (ref 3.5–5.1)
SODIUM: 137 mmol/L (ref 135–145)

## 2015-06-04 LAB — URINALYSIS, ROUTINE W REFLEX MICROSCOPIC
Bilirubin Urine: NEGATIVE
Glucose, UA: NEGATIVE mg/dL
Hgb urine dipstick: NEGATIVE
KETONES UR: 15 mg/dL — AB
LEUKOCYTES UA: NEGATIVE
NITRITE: NEGATIVE
PROTEIN: NEGATIVE mg/dL
Specific Gravity, Urine: 1.02 (ref 1.005–1.030)
pH: 6 (ref 5.0–8.0)

## 2015-06-04 LAB — CBC
HCT: 33.8 % — ABNORMAL LOW (ref 36.0–46.0)
HEMOGLOBIN: 11.4 g/dL — AB (ref 12.0–15.0)
MCH: 30.6 pg (ref 26.0–34.0)
MCHC: 33.7 g/dL (ref 30.0–36.0)
MCV: 90.9 fL (ref 78.0–100.0)
Platelets: 229 10*3/uL (ref 150–400)
RBC: 3.72 MIL/uL — AB (ref 3.87–5.11)
RDW: 13.4 % (ref 11.5–15.5)
WBC: 12.7 10*3/uL — ABNORMAL HIGH (ref 4.0–10.5)

## 2015-06-04 NOTE — MAU Provider Note (Signed)
History     CSN: [id number]  Arrival date and time: 06/04/15 1258   First Provider Initiated Contact with Patient 06/04/15 1324      Chief Complaint  Patient presents with  . 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 [id number] at [redacted]w[redacted]d she reports she was sitting in church one hour ago when she began generally not feeling well and reports feeling lightheaded, heart racing, hands tingling, hearing was decreased. States she slumped down and was carried out to the truck. Believe she lost consciousness but duration wast brief. No witnesses here with her. She's had some presyncopal episodes with her heart racing earlier during this pregnancy but this was the first fainting episode this pregnancy. She's had similar episodes with both of her previous pregnancies. Denies skipping meals and ate prior to going to church.  Per patient's husband who witnessed the episode: "She looked like she fell asleep." No fall occurred.    Loss of Consciousness This is a new (states she has similar episodes of passing out to or 3 times with each of her previous 2 pregnancies.) problem. The current episode started today. The problem has been resolved. She lost consciousness for a period of less than 1 minute. The symptoms are aggravated by unknown factors. Associated symptoms include an auditory change, light-headedness and palpitations. Pertinent negatives include no abdominal pain, back pain, chest pain, dizziness, focal weakness, nausea or vertigo. Associated symptoms comments: Noted tingling in both hands prior to episode. She has tried nothing for the symptoms. There is no history of arrhythmia, CAD, a clotting disorder, DM, HTN, seizures or vertigo. denies presyncope or syncope when not pregnant. No exercise intolerance.    Pregnancy course: essentially uncomplicated  OB History  Gravida Para Term Preterm AB SAB TAB Ectopic Multiple Living  4 2 2  0 1 1 0 0 0 2    # Outcome Date GA Lbr  Len/2nd Weight Sex Delivery Anes PTL Lv  4 Current           3 Term 12/30/10 [redacted]w[redacted]d 10:50 / 00:29 2.761 kg (6 lb 1.4 oz) F Vag-Spont EPI  Y     Comments: left arm below elbow missing  2 Term 11/28/04 [redacted]w[redacted]d  3.118 kg (6 lb 14 oz) F Vag-Spont EPI  Y  1 SAB                 Past Medical History  Diagnosis Date  . Chronic headaches     none x 2-3 yrs  . Meningitis   . Infection     UTI  . Ovarian cyst   . Anxiety     Past Surgical History  Procedure Laterality Date  . Cholecystectomy      Family History  Problem Relation Age of Onset  . Hypertension Mother   . Breast cancer Maternal Aunt   . Birth defects Daughter     Limb reduction defect    Social History  Substance Use Topics  . Smoking status: Never Smoker   . Smokeless tobacco: Never Used  . Alcohol Use: No    Allergies:  Allergies  Allergen Reactions  . Penicillins Hives    Prescriptions prior to admission  Medication Sig Dispense Refill Last Dose  . acetaminophen (TYLENOL) 325 MG tablet Take 650 mg by mouth every 6 (six) hours as needed.   Taking  . docusate sodium (COLACE) 100 MG capsule Take 1 capsule (100 mg total) by mouth 2 (two) times daily.  60 capsule 1 Taking  . hydrOXYzine (ATARAX/VISTARIL) 25 MG tablet Take 1 tablet (25 mg total) by mouth 2 (two) times daily as needed. (Patient not taking: Reported on 05/17/2015) 30 tablet 2 Not Taking  . oxyCODONE-acetaminophen (PERCOCET/ROXICET) 5-325 MG tablet Take 1 tablet by mouth every 4 (four) hours as needed for moderate pain or severe pain. 40 tablet 0   . Prenat w/o A-FeCbn-Meth-FA-DHA (OB COMPLETE GOLD) 27.5-1-200 MG CAPS Take 1 capsule by mouth daily. 30 capsule 11 Taking  . ranitidine (ZANTAC) 150 MG tablet Take 1 tablet (150 mg total) by mouth 2 (two) times daily. 60 tablet 5 Taking    Review of Systems  Cardiovascular: Positive for palpitations and syncope. Negative for chest pain.  Gastrointestinal: Negative for nausea and abdominal pain.   Genitourinary: Negative for dysuria, urgency and frequency.  Musculoskeletal: Negative for myalgias, back pain, falls and neck pain.  Neurological: Positive for tingling and light-headedness. Negative for dizziness, vertigo, speech change and focal weakness.  Psychiatric/Behavioral: Negative for depression. The patient is not nervous/anxious.    Physical Exam   Blood pressure 104/58, pulse 111, temperature 98.2 F (36.8 C), temperature source Oral, resp. rate 18, last menstrual period 10/29/2014, SpO2 99 %. Filed Vitals:   06/04/15 1319 06/04/15 1320 06/04/15 1321 06/04/15 1324  BP: 101/60 100/62 95/58 104/58  Pulse: 109 100 111 111  Temp:      TempSrc:      Resp:      SpO2:       Physical Exam  Nursing note and vitals reviewed. Constitutional: She is oriented to person, place, and time. She appears well-developed and well-nourished. No distress.  HENT:  Head: Normocephalic.  Eyes: Conjunctivae and EOM are normal. Pupils are equal, round, and reactive to light.  Neck: Normal range of motion.  Cardiovascular: Normal rate, regular rhythm and normal heart sounds.   Respiratory: Effort normal and breath sounds normal.  GI: Soft. There is no tenderness.  Musculoskeletal: Normal range of motion.  Neurological: She is alert and oriented to person, place, and time. She has normal reflexes. No cranial nerve deficit. Coordination normal.  Skin: Skin is warm and dry. She is not diaphoretic.    FHR 140 baseline, reactive Toco: no UCs  MAU Course  Procedures Results for orders placed or performed during the hospital encounter of 06/04/15 (from the past 24 hour(s))  Urinalysis, Routine w reflex microscopic (not at [facility])     Status: Abnormal   Collection Time: 06/04/15  1:10 PM  Result Value Ref Range   Color, Urine YELLOW YELLOW   APPearance CLEAR CLEAR   Specific Gravity, Urine 1.020 1.005 - 1.030   pH 6.0 5.0 - 8.0   Glucose, UA NEGATIVE NEGATIVE mg/dL   Hgb urine dipstick  NEGATIVE NEGATIVE   Bilirubin Urine NEGATIVE NEGATIVE   Ketones, ur 15 (A) NEGATIVE mg/dL   Protein, ur NEGATIVE NEGATIVE mg/dL   Nitrite NEGATIVE NEGATIVE   Leukocytes, UA NEGATIVE NEGATIVE  CBC     Status: Abnormal   Collection Time: 06/04/15  2:12 PM  Result Value Ref Range   WBC 12.7 (H) 4.0 - 10.5 K/uL   RBC 3.72 (L) 3.87 - 5.11 MIL/uL   Hemoglobin 11.4 (L) 12.0 - 15.0 g/dL   HCT 33.8 (L) 36.0 - 46.0 %   MCV 90.9 78.0 - 100.0 fL   MCH 30.6 26.0 - 34.0 pg   MCHC 33.7 30.0 - 36.0 g/dL   RDW 13.4 11.5 - 15.5 %   Platelets  229 150 - 400 K/uL  Basic metabolic panel     Status: Abnormal   Collection Time: 06/04/15  2:12 PM  Result Value Ref Range   Sodium 137 135 - 145 mmol/L   Potassium 3.4 (L) 3.5 - 5.1 mmol/L   Chloride 103 101 - 111 mmol/L   CO2 27 22 - 32 mmol/L   Glucose, Bld 92 65 - 99 mg/dL   BUN 8 6 - 20 mg/dL   Creatinine, Ser 0.42 (L) 0.44 - 1.00 mg/dL   Calcium 9.2 8.9 - 10.3 mg/dL   GFR calc non Af Amer >60 >60 mL/min   GFR calc Af Amer >60 >60 mL/min   Anion gap 7 5 - 15   MDM Discussed history, PE, lab and EKG reports with Dr. Jodi Mourning. Will discharge home with reassurance  Assessment and Plan   1. Near syncope      Medication List    STOP taking these medications        hydrOXYzine 25 MG tablet  Commonly known as:  ATARAX/VISTARIL     oxyCODONE-acetaminophen 5-325 MG tablet  Commonly known as:  PERCOCET/ROXICET      TAKE these medications        acetaminophen 325 MG tablet  Commonly known as:  TYLENOL  Take 650 mg by mouth every 6 (six) hours as needed for moderate pain.     docusate sodium 100 MG capsule  Commonly known as:  COLACE  Take 1 capsule (100 mg total) by mouth 2 (two) times daily.     OB COMPLETE GOLD 27.5-1-200 MG Caps  Take 1 capsule by mouth daily.     ranitidine 150 MG tablet  Commonly known as:  ZANTAC  Take 1 tablet (150 mg total) by mouth 2 (two) times daily.       Follow-up Information    Follow up with  HARPER,CHARLES A, MD On 06/14/2015.   Specialty:  Obstetrics and Gynecology   Why:  Keep your scheduled prenatal appointment   Contact information:   [address] [place] [address] [place] [address] [phone]       Wright Gravely 06/04/2015, 1:29 PM

## 2015-06-04 NOTE — MAU Note (Signed)
Pt states she fainted in church this morning, lost consciousness, pt unsure how long.  Also C/O SOB since before she fainted.  Pt denies pain, bleeding or LOF.

## 2015-06-04 NOTE — Discharge Instructions (Signed)

## 2015-06-14 ENCOUNTER — Ambulatory Visit (INDEPENDENT_AMBULATORY_CARE_PROVIDER_SITE_OTHER): Payer: Medicaid Other | Admitting: Obstetrics

## 2015-06-14 ENCOUNTER — Encounter: Payer: Self-pay | Admitting: Obstetrics

## 2015-06-14 VITALS — BP 101/67 | HR 96 | Temp 98.0°F | Wt 162.0 lb

## 2015-06-14 DIAGNOSIS — Z3483 Encounter for supervision of other normal pregnancy, third trimester: Secondary | ICD-10-CM

## 2015-06-14 LAB — POCT URINALYSIS DIPSTICK
BILIRUBIN UA: NEGATIVE
Glucose, UA: NEGATIVE
LEUKOCYTES UA: NEGATIVE
NITRITE UA: NEGATIVE
PH UA: 7
PROTEIN UA: NEGATIVE
RBC UA: NEGATIVE
SPEC GRAV UA: 1.015
Urobilinogen, UA: NEGATIVE

## 2015-06-14 NOTE — Progress Notes (Signed)
Subjective:    Lydia [name] is a 30 y.o. female being seen today for her obstetrical visit. She is at [redacted]w[redacted]d gestation. Patient reports no complaints. Fetal movement: normal.  Problem List Items Addressed This Visit    None    Visit Diagnoses    Encounter for supervision of other normal pregnancy in third trimester    -  Primary    Relevant Orders    POCT urinalysis dipstick (Completed)      Patient Active Problem List   Diagnosis Date Noted  . Family history of congenital anomalies   . Dyspareunia, female 03/11/2014  . Acute recurrent maxillary sinusitis 03/11/2014  . Abdominal pain, epigastric 01/26/2014  . Tinea versicolor 12/08/2013  . Pain 12/08/2013  . Unspecified inflammatory disease of female pelvic organs and tissues 12/08/2013  . Pelvic pain in female 07/06/2013  . Dysmenorrhea 10/21/2012  . Unspecified symptom associated with female genital organs 10/07/2012  . Follicular cyst of ovary 10/07/2012  . Other and unspecified ovarian cyst 10/07/2012  . 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 HEADACHE 11/30/2007   Objective:    BP 101/67 mmHg  Pulse 96  Temp(Src) 98 F (36.7 C)  Wt 162 lb (73.483 kg)  LMP 10/29/2014 (Approximate) FHT:  140 BPM  Uterine Size: size equals dates  Presentation: unsure     Assessment:    Pregnancy @ [redacted]w[redacted]d weeks   Plan:     labs reviewed, problem list updated Consent signed. GBS sent TDAP offered  Rhogam given for RH negative Pediatrician: discussed. Infant feeding: plans to breastfeed. Maternity leave: discussed. Cigarette smoking: never smoked. Orders Placed This Encounter  Procedures  . POCT urinalysis dipstick   No orders of the defined types were placed in this encounter.   Follow up in 2 Weeks.

## 2015-06-14 NOTE — Progress Notes (Signed)
Pt is having increase in pelvic pain.

## 2015-06-22 ENCOUNTER — Telehealth: Payer: Self-pay | Admitting: *Deleted

## 2015-06-22 NOTE — Telephone Encounter (Signed)
Patient was concerned about her baby's movement- discussed kick counts and told patient if she feels she needs to come in to call the front for an appointment- if she doesn't feel her baby moving at all that is not normal and she needs to go to the hospital immediately. Patient has a sinus infection and is requesting something for that.

## 2015-06-24 ENCOUNTER — Other Ambulatory Visit: Payer: Self-pay | Admitting: Obstetrics

## 2015-06-24 DIAGNOSIS — J0100 Acute maxillary sinusitis, unspecified: Secondary | ICD-10-CM

## 2015-06-24 MED ORDER — AZITHROMYCIN 250 MG PO TABS: ORAL_TABLET | ORAL | Status: DC

## 2015-06-24 NOTE — Telephone Encounter (Signed)
Z-Pak Rx

## 2015-06-28 ENCOUNTER — Encounter: Payer: Self-pay | Admitting: Obstetrics

## 2015-06-28 ENCOUNTER — Ambulatory Visit (INDEPENDENT_AMBULATORY_CARE_PROVIDER_SITE_OTHER): Payer: Medicaid Other | Admitting: Obstetrics

## 2015-06-28 VITALS — BP 100/68 | HR 97 | Temp 98.4°F | Wt 164.0 lb

## 2015-06-28 DIAGNOSIS — Z3483 Encounter for supervision of other normal pregnancy, third trimester: Secondary | ICD-10-CM

## 2015-06-28 LAB — POCT URINALYSIS DIPSTICK
Bilirubin, UA: NEGATIVE
Glucose, UA: NEGATIVE
Ketones, UA: NEGATIVE
LEUKOCYTES UA: NEGATIVE
NITRITE UA: NEGATIVE
PH UA: 6.5
PROTEIN UA: NEGATIVE
RBC UA: NEGATIVE
Spec Grav, UA: <=1.005
UROBILINOGEN UA: NEGATIVE

## 2015-06-28 NOTE — Progress Notes (Signed)
Subjective:    Lydia [name] is a 30 y.o. female being seen today for her obstetrical visit. She is at [redacted]w[redacted]d gestation. Patient reports no complaints. Fetal movement: normal.  Problem List Items Addressed This Visit    None    Visit Diagnoses    Encounter for supervision of other normal pregnancy in third trimester    -  Primary    Relevant Orders    Strep B DNA probe    POCT urinalysis dipstick (Completed)      Patient Active Problem List   Diagnosis Date Noted  . Family history of congenital anomalies   . Dyspareunia, female 03/11/2014  . Acute recurrent maxillary sinusitis 03/11/2014  . Abdominal pain, epigastric 01/26/2014  . Tinea versicolor 12/08/2013  . Pain 12/08/2013  . Unspecified inflammatory disease of female pelvic organs and tissues 12/08/2013  . Pelvic pain in female 07/06/2013  . Dysmenorrhea 10/21/2012  . Unspecified symptom associated with female genital organs 10/07/2012  . Follicular cyst of ovary 10/07/2012  . Other and unspecified ovarian cyst 10/07/2012  . 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 HEADACHE 11/30/2007   Objective:    BP 100/68 mmHg  Pulse 97  Temp(Src) 98.4 F (36.9 C)  Wt 164 lb (74.39 kg)  LMP 10/29/2014 (Approximate) FHT:  140 BPM  Uterine Size: size equals dates  Presentation: unsure     Assessment:    Pregnancy @ [redacted]w[redacted]d weeks   Plan:     labs reviewed, problem list updated Consent signed. GBS sent TDAP offered  Rhogam given for RH negative Pediatrician: discussed. Infant feeding: plans to breastfeed. Maternity leave: discussed. Cigarette smoking: never smoked. Orders Placed This Encounter  Procedures  . Strep B DNA probe  . POCT urinalysis dipstick   No orders of the defined types were placed in this encounter.   Follow up in 1 Week.

## 2015-06-29 LAB — STREP B DNA PROBE: STREP GROUP B AG: NOT DETECTED

## 2015-07-05 ENCOUNTER — Ambulatory Visit (INDEPENDENT_AMBULATORY_CARE_PROVIDER_SITE_OTHER): Payer: Medicaid Other | Admitting: Obstetrics

## 2015-07-05 ENCOUNTER — Encounter: Payer: Self-pay | Admitting: Obstetrics

## 2015-07-05 VITALS — BP 98/66 | HR 85 | Temp 98.0°F | Wt 166.0 lb

## 2015-07-05 DIAGNOSIS — Z3483 Encounter for supervision of other normal pregnancy, third trimester: Secondary | ICD-10-CM

## 2015-07-05 LAB — POCT URINALYSIS DIPSTICK
Bilirubin, UA: NEGATIVE
GLUCOSE UA: NEGATIVE
Ketones, UA: NEGATIVE
LEUKOCYTES UA: NEGATIVE
NITRITE UA: NEGATIVE
Protein, UA: NEGATIVE
RBC UA: NEGATIVE
Spec Grav, UA: 1.01
UROBILINOGEN UA: NEGATIVE
pH, UA: 7

## 2015-07-05 NOTE — Progress Notes (Signed)
Subjective:    Lydia [name] is a 30 y.o. female being seen today for her obstetrical visit. She is at [redacted]w[redacted]d gestation. Patient reports no complaints. Fetal movement: normal.  Problem List Items Addressed This Visit    None     Patient Active Problem List   Diagnosis Date Noted  . Family history of congenital anomalies   . Dyspareunia, female 03/11/2014  . Acute recurrent maxillary sinusitis 03/11/2014  . Abdominal pain, epigastric 01/26/2014  . Tinea versicolor 12/08/2013  . Pain 12/08/2013  . Unspecified inflammatory disease of female pelvic organs and tissues 12/08/2013  . Pelvic pain in female 07/06/2013  . Dysmenorrhea 10/21/2012  . Unspecified symptom associated with female genital organs 10/07/2012  . Follicular cyst of ovary 10/07/2012  . Other and unspecified ovarian cyst 10/07/2012  . 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 HEADACHE 11/30/2007   Objective:    BP 98/66 mmHg  Pulse 85  Temp(Src) 98 F (36.7 C)  Wt 166 lb (75.297 kg)  LMP 10/29/2014 (Approximate) FHT:  150 BPM  Uterine Size: size equals dates  Presentation: unsure     Assessment:    Pregnancy @ [redacted]w[redacted]d weeks   Plan:     labs reviewed, problem list updated Consent signed. GBS sent TDAP offered  Rhogam given for RH negative Pediatrician: discussed. Infant feeding: plans to breastfeed. Maternity leave: discussed. Cigarette smoking: never smoked. No orders of the defined types were placed in this encounter.   No orders of the defined types were placed in this encounter.   Follow up in 1 Week.

## 2015-07-05 NOTE — Addendum Note (Signed)
Addended by: Carole Binning on: 07/05/2015 04:53 PM   Modules accepted: Orders

## 2015-07-12 ENCOUNTER — Ambulatory Visit (INDEPENDENT_AMBULATORY_CARE_PROVIDER_SITE_OTHER): Payer: Medicaid Other | Admitting: Obstetrics

## 2015-07-12 ENCOUNTER — Encounter: Payer: Self-pay | Admitting: Obstetrics

## 2015-07-12 VITALS — BP 97/65 | HR 87 | Temp 97.9°F | Wt 166.0 lb

## 2015-07-12 DIAGNOSIS — Z3483 Encounter for supervision of other normal pregnancy, third trimester: Secondary | ICD-10-CM

## 2015-07-12 LAB — POCT URINALYSIS DIPSTICK
BILIRUBIN UA: NEGATIVE
Blood, UA: NEGATIVE
GLUCOSE UA: NEGATIVE
KETONES UA: NEGATIVE
LEUKOCYTES UA: NEGATIVE
Nitrite, UA: NEGATIVE
Protein, UA: NEGATIVE
SPEC GRAV UA: 1.01
Urobilinogen, UA: NEGATIVE
pH, UA: 7

## 2015-07-12 NOTE — Progress Notes (Signed)
Subjective:    Lydia [name] is a 30 y.o. female being seen today for her obstetrical visit. She is at [redacted]w[redacted]d gestation. Patient reports no complaints. Fetal movement: normal.  Problem List Items Addressed This Visit    None     Patient Active Problem List   Diagnosis Date Noted  . Family history of congenital anomalies   . Dyspareunia, female 03/11/2014  . Acute recurrent maxillary sinusitis 03/11/2014  . Abdominal pain, epigastric 01/26/2014  . Tinea versicolor 12/08/2013  . Pain 12/08/2013  . Unspecified inflammatory disease of female pelvic organs and tissues 12/08/2013  . Pelvic pain in female 07/06/2013  . Dysmenorrhea 10/21/2012  . Unspecified symptom associated with female genital organs 10/07/2012  . Follicular cyst of ovary 10/07/2012  . Other and unspecified ovarian cyst 10/07/2012  . 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 HEADACHE 11/30/2007   Objective:    BP 97/65 mmHg  Pulse 87  Temp(Src) 97.9 F (36.6 C)  Wt 166 lb (75.297 kg)  LMP 10/29/2014 (Approximate) FHT:  140 BPM  Uterine Size: size equals dates  Presentation: unsure     Assessment:    Pregnancy @ [redacted]w[redacted]d weeks   Plan:     labs reviewed, problem list updated Consent signed. GBS sent TDAP offered  Rhogam given for RH negative Pediatrician: discussed. Infant feeding: plans to breastfeed. Maternity leave: discussed. Cigarette smoking: never smoked. No orders of the defined types were placed in this encounter.   No orders of the defined types were placed in this encounter.   Follow up in 1 Week.

## 2015-07-12 NOTE — Addendum Note (Signed)
Addended by: Lewie Loron D on: 07/12/2015 04:53 PM   Modules accepted: Orders

## 2015-07-19 ENCOUNTER — Ambulatory Visit (INDEPENDENT_AMBULATORY_CARE_PROVIDER_SITE_OTHER): Payer: Medicaid Other | Admitting: Obstetrics

## 2015-07-19 ENCOUNTER — Encounter: Payer: Self-pay | Admitting: Obstetrics

## 2015-07-19 VITALS — BP 105/71 | HR 101 | Temp 97.5°F | Wt 169.0 lb

## 2015-07-19 DIAGNOSIS — Z3483 Encounter for supervision of other normal pregnancy, third trimester: Secondary | ICD-10-CM

## 2015-07-19 DIAGNOSIS — Z1389 Encounter for screening for other disorder: Secondary | ICD-10-CM

## 2015-07-19 DIAGNOSIS — Z331 Pregnant state, incidental: Secondary | ICD-10-CM

## 2015-07-19 NOTE — Progress Notes (Signed)
Subjective:    Lydia [name] is a 30 y.o. female being seen today for her obstetrical visit. She is at [redacted]w[redacted]d gestation. Patient reports no complaints. Fetal movement: normal.  Problem List Items Addressed This Visit    None    Visit Diagnoses    Supervision of other normal pregnancy, antepartum, third trimester    -  Primary    Relevant Orders    POCT urinalysis dipstick      Patient Active Problem List   Diagnosis Date Noted  . Family history of congenital anomalies   . Dyspareunia, female 03/11/2014  . Acute recurrent maxillary sinusitis 03/11/2014  . Abdominal pain, epigastric 01/26/2014  . Tinea versicolor 12/08/2013  . Pain 12/08/2013  . Unspecified inflammatory disease of female pelvic organs and tissues 12/08/2013  . Pelvic pain in female 07/06/2013  . Dysmenorrhea 10/21/2012  . Unspecified symptom associated with female genital organs 10/07/2012  . Follicular cyst of ovary 10/07/2012  . Other and unspecified ovarian cyst 10/07/2012  . 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 HEADACHE 11/30/2007    Objective:    BP 105/71 mmHg  Pulse 101  Temp(Src) 97.5 F (36.4 C)  Wt 169 lb (76.658 kg)  LMP 10/29/2014 (Approximate) FHT: 140 BPM  Uterine Size: size equals dates  Presentations: unsure    Assessment:    Pregnancy @ [redacted]w[redacted]d weeks   Plan:   Plans for delivery: Vaginal anticipated; labs reviewed; problem list updated Counseling: Consent signed. Infant feeding: plans to breastfeed. Cigarette smoking: never smoked. L&D discussion: symptoms of labor, discussed when to call, discussed what number to call, anesthetic/analgesic options reviewed and delivering clinician:  plans no preference. Postpartum supports and preparation: circumcision discussed and contraception plans discussed.  Follow up in 1 Week.

## 2015-07-26 ENCOUNTER — Encounter: Payer: Self-pay | Admitting: Obstetrics

## 2015-07-26 ENCOUNTER — Ambulatory Visit (INDEPENDENT_AMBULATORY_CARE_PROVIDER_SITE_OTHER): Payer: Medicaid Other | Admitting: Obstetrics

## 2015-07-26 VITALS — BP 103/79 | HR 99 | Temp 97.8°F | Wt 170.0 lb

## 2015-07-26 DIAGNOSIS — Z3483 Encounter for supervision of other normal pregnancy, third trimester: Secondary | ICD-10-CM

## 2015-07-26 LAB — POCT URINALYSIS DIPSTICK
Bilirubin, UA: NEGATIVE
GLUCOSE UA: NEGATIVE
Ketones, UA: NEGATIVE
Leukocytes, UA: NEGATIVE
NITRITE UA: NEGATIVE
PH UA: 7
PROTEIN UA: NEGATIVE
RBC UA: NEGATIVE
Spec Grav, UA: 1.01
UROBILINOGEN UA: NEGATIVE

## 2015-07-26 NOTE — Progress Notes (Signed)
Subjective:    Lydia [name] is a 30 y.o. female being seen today for her obstetrical visit. She is at [redacted]w[redacted]d gestation. Patient reports no complaints. Fetal movement: normal.  Problem List Items Addressed This Visit    None    Visit Diagnoses    Supervision of other normal pregnancy, antepartum, third trimester    -  Primary    Relevant Orders    POCT urinalysis dipstick      Patient Active Problem List   Diagnosis Date Noted  . Family history of congenital anomalies   . Dyspareunia, female 03/11/2014  . Acute recurrent maxillary sinusitis 03/11/2014  . Abdominal pain, epigastric 01/26/2014  . Tinea versicolor 12/08/2013  . Pain 12/08/2013  . Unspecified inflammatory disease of female pelvic organs and tissues 12/08/2013  . Pelvic pain in female 07/06/2013  . Dysmenorrhea 10/21/2012  . Unspecified symptom associated with female genital organs 10/07/2012  . Follicular cyst of ovary 10/07/2012  . Other and unspecified ovarian cyst 10/07/2012  . 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 HEADACHE 11/30/2007    Objective:    BP 103/79 mmHg  Pulse 99  Temp(Src) 97.8 F (36.6 C)  Wt 170 lb (77.111 kg)  LMP 10/29/2014 (Approximate) FHT: 150 BPM  Uterine Size: size equals dates  Presentations: cephalic    Assessment:    Pregnancy @ [redacted]w[redacted]d weeks   Plan:   Plans for delivery: Vaginal anticipated; labs reviewed; problem list updated Counseling: Consent signed. Infant feeding: plans to breastfeed. Cigarette smoking: never smoked. L&D discussion: symptoms of labor, discussed when to call, discussed what number to call, anesthetic/analgesic options reviewed and delivering clinician:  plans no preference. Postpartum supports and preparation: circumcision discussed and contraception plans discussed.  Follow up in 1 Week.

## 2015-07-31 ENCOUNTER — Telehealth: Payer: Self-pay | Admitting: *Deleted

## 2015-07-31 NOTE — Telephone Encounter (Signed)
Patient states she feels weird. She is 39 weeks and she has lower back pain, nausea, and dizziness. 1:50 Call to patient- she states she has anxiety and she is stressing a little. She is taking medication that Dr Jodi Mourning gave her, but it makes her mean. She has been having some light contractions, but nothing significant. She has an appointment tomorrow. She is aware that if she feels worse she needs to got to MAU- and she will keep her appointment for tomorrow.

## 2015-08-01 ENCOUNTER — Encounter: Payer: Self-pay | Admitting: Obstetrics

## 2015-08-01 ENCOUNTER — Ambulatory Visit (INDEPENDENT_AMBULATORY_CARE_PROVIDER_SITE_OTHER): Payer: Medicaid Other | Admitting: Obstetrics

## 2015-08-01 ENCOUNTER — Other Ambulatory Visit: Payer: Self-pay | Admitting: *Deleted

## 2015-08-01 VITALS — BP 109/72 | HR 102 | Wt 170.0 lb

## 2015-08-01 DIAGNOSIS — Z3483 Encounter for supervision of other normal pregnancy, third trimester: Secondary | ICD-10-CM

## 2015-08-01 LAB — POCT URINALYSIS DIPSTICK
Bilirubin, UA: NEGATIVE
Glucose, UA: NEGATIVE
KETONES UA: NEGATIVE
LEUKOCYTES UA: NEGATIVE
PH UA: 7
PROTEIN UA: NEGATIVE
Spec Grav, UA: <=1.005
UROBILINOGEN UA: NEGATIVE

## 2015-08-01 NOTE — Progress Notes (Signed)
Subjective:    Lydia [name] is a 30 y.o. female being seen today for her obstetrical visit. She is at [redacted]w[redacted]d gestation. Patient reports no complaints. Fetal movement: normal.  Problem List Items Addressed This Visit    None     Patient Active Problem List   Diagnosis Date Noted  . Family history of congenital anomalies   . Dyspareunia, female 03/11/2014  . Acute recurrent maxillary sinusitis 03/11/2014  . Abdominal pain, epigastric 01/26/2014  . Tinea versicolor 12/08/2013  . Pain 12/08/2013  . Unspecified inflammatory disease of female pelvic organs and tissues 12/08/2013  . Pelvic pain in female 07/06/2013  . Dysmenorrhea 10/21/2012  . Unspecified symptom associated with female genital organs 10/07/2012  . Follicular cyst of ovary 10/07/2012  . Other and unspecified ovarian cyst 10/07/2012  . 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 HEADACHE 11/30/2007    Objective:    BP 109/72 mmHg  Pulse 102  Wt 170 lb (77.111 kg)  LMP 10/29/2014 (Approximate) FHT: 140 BPM  Uterine Size: size equals dates  Presentations: cephalic  Pelvic Exam:              Dilation: 2cm       Effacement: 50%             Station:  -2    Consistency: soft            Position: middle     Assessment:    Pregnancy @ [redacted]w[redacted]d weeks   Plan:   Plans for delivery: Vaginal anticipated; labs reviewed; problem list updated Counseling: Consent signed. Infant feeding: plans to breastfeed. Cigarette smoking: never smoked. L&D discussion: symptoms of labor, discussed when to call, discussed what number to call, anesthetic/analgesic options reviewed and delivering clinician:  plans no preference. Postpartum supports and preparation: circumcision discussed and contraception plans discussed.  Follow up in 1 Week.

## 2015-08-02 ENCOUNTER — Inpatient Hospital Stay (HOSPITAL_COMMUNITY): Payer: Medicaid Other | Admitting: Anesthesiology

## 2015-08-02 ENCOUNTER — Encounter (HOSPITAL_COMMUNITY): Payer: Self-pay

## 2015-08-02 ENCOUNTER — Inpatient Hospital Stay (HOSPITAL_COMMUNITY)
Admission: RE | Admit: 2015-08-02 | Discharge: 2015-08-04 | DRG: 775 | Disposition: A | Discharge status: Home / Self Care | Payer: Medicaid Other | Source: Ambulatory Visit | Attending: Obstetrics | Admitting: Obstetrics

## 2015-08-02 DIAGNOSIS — Z3A39 39 weeks gestation of pregnancy: Secondary | ICD-10-CM

## 2015-08-02 DIAGNOSIS — Z8661 Personal history of infections of the central nervous system: Secondary | ICD-10-CM

## 2015-08-02 DIAGNOSIS — Z8249 Family history of ischemic heart disease and other diseases of the circulatory system: Secondary | ICD-10-CM

## 2015-08-02 LAB — CBC
HCT: 35.2 % — ABNORMAL LOW (ref 36.0–46.0)
Hemoglobin: 11.9 g/dL — ABNORMAL LOW (ref 12.0–15.0)
MCH: 29.8 pg (ref 26.0–34.0)
MCHC: 33.8 g/dL (ref 30.0–36.0)
MCV: 88.2 fL (ref 78.0–100.0)
Platelets: 222 10*3/uL (ref 150–400)
RBC: 3.99 MIL/uL (ref 3.87–5.11)
RDW: 14 % (ref 11.5–15.5)
WBC: 10 10*3/uL (ref 4.0–10.5)

## 2015-08-02 LAB — TYPE AND SCREEN
ABO/RH(D): O POS
Antibody Screen: NEGATIVE

## 2015-08-02 MED ORDER — CITRIC ACID-SODIUM CITRATE 334-500 MG/5ML PO SOLN: 30.0000 mL | ORAL | Status: DC | PRN

## 2015-08-02 MED ORDER — PHENYLEPHRINE 40 MCG/ML (10ML) SYRINGE FOR IV PUSH (FOR BLOOD PRESSURE SUPPORT)
80.0000 ug | PREFILLED_SYRINGE | INTRAVENOUS | Status: DC | PRN
  Filled 2015-08-02: qty 2
  Filled 2015-08-02: qty 20

## 2015-08-02 MED ORDER — LIDOCAINE HCL (PF) 1 % IJ SOLN
INTRAMUSCULAR | Status: DC | PRN
  Administered 2015-08-02: 8 mL via EPIDURAL

## 2015-08-02 MED ORDER — LACTATED RINGERS IV SOLN: 500.0000 mL | INTRAVENOUS | Status: DC | PRN

## 2015-08-02 MED ORDER — MISOPROSTOL 200 MCG PO TABS
50.0000 ug | ORAL_TABLET | ORAL | Status: DC
  Administered 2015-08-02: 50 ug via ORAL
  Filled 2015-08-02: qty 0.5

## 2015-08-02 MED ORDER — OXYTOCIN BOLUS FROM INFUSION: 500.0000 mL | INTRAVENOUS | Status: DC

## 2015-08-02 MED ORDER — LACTATED RINGERS IV SOLN
INTRAVENOUS | Status: DC
  Administered 2015-08-02 – 2015-08-03 (×3): via INTRAVENOUS

## 2015-08-02 MED ORDER — FLEET ENEMA 7-19 GM/118ML RE ENEM: 1.0000 | ENEMA | RECTAL | Status: DC | PRN

## 2015-08-02 MED ORDER — OXYCODONE-ACETAMINOPHEN 5-325 MG PO TABS: 1.0000 | ORAL_TABLET | ORAL | Status: DC | PRN

## 2015-08-02 MED ORDER — MISOPROSTOL 25 MCG QUARTER TABLET
25.0000 ug | ORAL_TABLET | ORAL | Status: DC
  Filled 2015-08-02: qty 0.25

## 2015-08-02 MED ORDER — DIPHENHYDRAMINE HCL 50 MG/ML IJ SOLN: 12.5000 mg | INTRAMUSCULAR | Status: DC | PRN

## 2015-08-02 MED ORDER — TERBUTALINE SULFATE 1 MG/ML IJ SOLN
0.2500 mg | Freq: Once | INTRAMUSCULAR | Status: DC | PRN
  Filled 2015-08-02: qty 1

## 2015-08-02 MED ORDER — EPHEDRINE 5 MG/ML INJ
10.0000 mg | INTRAVENOUS | Status: DC | PRN
  Filled 2015-08-02: qty 2

## 2015-08-02 MED ORDER — LACTATED RINGERS IV SOLN: 500.0000 mL | Freq: Once | INTRAVENOUS | Status: DC

## 2015-08-02 MED ORDER — PHENYLEPHRINE 40 MCG/ML (10ML) SYRINGE FOR IV PUSH (FOR BLOOD PRESSURE SUPPORT)
80.0000 ug | PREFILLED_SYRINGE | INTRAVENOUS | Status: DC | PRN
  Filled 2015-08-02: qty 2

## 2015-08-02 MED ORDER — FENTANYL 2.5 MCG/ML BUPIVACAINE 1/10 % EPIDURAL INFUSION (WH - ANES)
14.0000 mL/h | INTRAMUSCULAR | Status: DC | PRN
  Administered 2015-08-02 – 2015-08-03 (×2): 14 mL/h via EPIDURAL
  Filled 2015-08-02 (×2): qty 125

## 2015-08-02 MED ORDER — OXYTOCIN 10 UNIT/ML IJ SOLN
1.0000 m[IU]/min | INTRAVENOUS | Status: DC
  Administered 2015-08-02: 2 m[IU]/min via INTRAVENOUS
  Filled 2015-08-02: qty 10

## 2015-08-02 MED ORDER — LACTATED RINGERS IV SOLN
2.5000 [IU]/h | INTRAVENOUS | Status: DC
Start: 2015-08-02 — End: 2015-08-03

## 2015-08-02 MED ORDER — LIDOCAINE HCL (PF) 1 % IJ SOLN
30.0000 mL | INTRAMUSCULAR | Status: DC | PRN
  Filled 2015-08-02: qty 30

## 2015-08-02 MED ORDER — OXYCODONE-ACETAMINOPHEN 5-325 MG PO TABS: 2.0000 | ORAL_TABLET | ORAL | Status: DC | PRN

## 2015-08-02 MED ORDER — ONDANSETRON HCL 4 MG/2ML IJ SOLN
4.0000 mg | Freq: Four times a day (QID) | INTRAMUSCULAR | Status: DC | PRN
  Administered 2015-08-02: 4 mg via INTRAVENOUS
  Filled 2015-08-02: qty 2

## 2015-08-02 MED ORDER — ACETAMINOPHEN 325 MG PO TABS: 650.0000 mg | ORAL_TABLET | ORAL | Status: DC | PRN

## 2015-08-02 NOTE — Anesthesia Preprocedure Evaluation (Signed)
Anesthesia Evaluation  Patient identified by MRN, date of birth, ID band Patient awake    Reviewed: Allergy & Precautions, H&P , NPO status , Patient's Chart, lab work & pertinent test results  Airway Mallampati: I  TM Distance: >3 FB Neck ROM: full    Dental no notable dental hx.    Pulmonary neg pulmonary ROS,    Pulmonary exam normal breath sounds clear to auscultation       Cardiovascular negative cardio ROS Normal cardiovascular exam     Neuro/Psych negative psych ROS   GI/Hepatic negative GI ROS, Neg liver ROS,   Endo/Other  negative endocrine ROS  Renal/GU negative Renal ROS     Musculoskeletal   Abdominal (+) + obese,   Peds  Hematology negative hematology ROS (+)   Anesthesia Other Findings   Reproductive/Obstetrics (+) Pregnancy                             Anesthesia Physical Anesthesia Plan  ASA: II  Anesthesia Plan: Epidural   Post-op Pain Management:    Induction:   Airway Management Planned:   Additional Equipment:   Intra-op Plan:   Post-operative Plan:   Informed Consent: I have reviewed the patients History and Physical, chart, labs and discussed the procedure including the risks, benefits and alternatives for the proposed anesthesia with the patient or authorized representative who has indicated his/her understanding and acceptance.     Plan Discussed with:   Anesthesia Plan Comments:         Anesthesia Quick Evaluation

## 2015-08-02 NOTE — Anesthesia Procedure Notes (Signed)
Epidural Patient location during procedure: OB Start time: 08/02/2015 4:52 PM End time: 08/02/2015 4:58 PM  Staffing Anesthesiologist: Lyn Hollingshead Performed by: anesthesiologist   Preanesthetic Checklist Completed: patient identified, surgical consent, pre-op evaluation, timeout performed, IV checked, risks and benefits discussed and monitors and equipment checked  Epidural Patient position: sitting Prep: site prepped and draped and DuraPrep Patient monitoring: continuous pulse ox and blood pressure Approach: midline Location: L3-L4 Injection technique: LOR air  Needle:  Needle type: Tuohy  Needle gauge: 17 G Needle length: 9 cm and 9 Needle insertion depth: 6 cm Catheter type: closed end flexible Catheter size: 19 Gauge Catheter at skin depth: 11 cm Test dose: negative and Other  Assessment Sensory level: T9 Events: blood not aspirated, injection not painful, no injection resistance, negative IV test and no paresthesia  Additional Notes Reason for block:procedure for pain

## 2015-08-02 NOTE — H&P (Signed)
Lydia [name] is a 30 y.o. female presenting for elective IOL by maternal request.  Multiparity.. Maternal Medical History:  Fetal activity: Perceived fetal activity is normal.    Prenatal complications: no prenatal complications Prenatal Complications - Diabetes: none.    OB History    Gravida Para Term Preterm AB TAB SAB Ectopic Multiple Living   4 2 2  0 1 0 1 0 0 2     Past Medical History  Diagnosis Date  . Chronic headaches     none x 2-3 yrs  . Meningitis   . Infection     UTI  . Ovarian cyst   . Anxiety    Past Surgical History  Procedure Laterality Date  . Cholecystectomy     Family History: family history includes Birth defects in her daughter; Breast cancer in her maternal aunt; Hypertension in her mother. Social History:  reports that she has never smoked. She has never used smokeless tobacco. She reports that she does not drink alcohol or use illicit drugs.   Prenatal Transfer Tool  Maternal Diabetes: No Genetic Screening: Normal Maternal Ultrasounds/Referrals: Normal Fetal Ultrasounds or other Referrals:  None Maternal Substance Abuse:  No Significant Maternal Medications:  None Significant Maternal Lab Results:  None Other Comments:  None  Review of Systems  All other systems reviewed and are negative.   Dilation: 1 Effacement (%): 50 Station: -2 Exam by:: m wilkins rnc Blood pressure 109/63, pulse 88, temperature 98 F (36.7 C), height 4\' 11"  (1.499 m), weight 170 lb (77.111 kg), last menstrual period 10/29/2014. Maternal Exam:  Abdomen: Patient reports no abdominal tenderness. Fetal presentation: vertex  Introitus: Normal vulva. Normal vagina.  Pelvis: adequate for delivery.   Cervix: Cervix evaluated by digital exam.     Physical Exam  Nursing note and vitals reviewed. Constitutional: She is oriented to person, place, and time. She appears well-developed and well-nourished.  HENT:  Head: Normocephalic and atraumatic.  Eyes: Conjunctivae  are normal. Pupils are equal, round, and reactive to light.  Neck: Normal range of motion. Neck supple.  Cardiovascular: Normal rate and regular rhythm.   Respiratory: Effort normal and breath sounds normal.  GI: Soft. Bowel sounds are normal.  Genitourinary: Vagina normal and uterus normal.  Musculoskeletal: Normal range of motion.  Neurological: She is alert and oriented to person, place, and time.  Skin: Skin is warm and dry.  Psychiatric: She has a normal mood and affect. Her behavior is normal. Judgment and thought content normal.    Prenatal labs: ABO, Rh: --/--/O POS (03/22 1015) Antibody: NEG (03/22 1015) Rubella: 4.14 (08/31 1403) RPR: NON REAC (01/04 1136)  HBsAg: NEGATIVE (08/31 1403)  HIV: NONREACTIVE (01/04 1136)  GBS: NOT DETECTED (02/15 1410)   Assessment/Plan: 39 weeks.  Multiparity.  Elective IOL per maternal request.   Lydia [name] A 08/02/2015, 1:35 PM

## 2015-08-02 NOTE — Progress Notes (Signed)
Lydia [name] is a 30 y.o. [id number] at [redacted]w[redacted]d by LMP admitted for induction of labor due to Elective at term.  Subjective:   Objective: BP 114/69 mmHg  Pulse 87  Temp(Src) 98 F (36.7 C)  Ht 4\' 11"  (1.499 m)  Wt 170 lb (77.111 kg)  BMI 34.32 kg/m2  SpO2 98%  LMP 10/29/2014 (Approximate)      FHT:  FHR: 135 bpm, variability: moderate,  accelerations:  Present,  decelerations:  Absent UC:   regular, every 2-3 minutes SVE:   Dilation: 5 Effacement (%): 60 Station: -1 Exam by:: m wilkins rnc  Labs: Lab Results  Component Value Date   WBC 10.0 08/02/2015   HGB 11.9* 08/02/2015   HCT 35.2* 08/02/2015   MCV 88.2 08/02/2015   PLT 222 08/02/2015    Assessment / Plan: Induction of labor due to term with favorable cervix,  progressing well on pitocin  Labor: Progressing normally Preeclampsia:  none Fetal Wellbeing:  Category I Pain Control:  Nitrous Oxide I/D:  n/a Anticipated MOD:  NSVD  Elchonon Maxson A 08/02/2015, 6:30 PM

## 2015-08-02 NOTE — Consults (Signed)
  Anesthesia Pain Consult Note  Patient: Lydia [name], 30 y.o., female  Consult Requested by: Shelly Bombard, MD  Reason for Consult: CRNA pain rounds  Level of Consciousness: alert  Pain: current pain 3, pain goal 9, planning epidural     [facility] 08/02/2015

## 2015-08-03 ENCOUNTER — Encounter (HOSPITAL_COMMUNITY): Payer: Self-pay

## 2015-08-03 LAB — RPR: RPR Ser Ql: NONREACTIVE

## 2015-08-03 MED ORDER — ONDANSETRON HCL 4 MG PO TABS: 4.0000 mg | ORAL_TABLET | ORAL | Status: DC | PRN

## 2015-08-03 MED ORDER — OXYCODONE-ACETAMINOPHEN 5-325 MG PO TABS: 2.0000 | ORAL_TABLET | ORAL | Status: DC | PRN

## 2015-08-03 MED ORDER — METHYLERGONOVINE MALEATE 0.2 MG PO TABS
0.2000 mg | ORAL_TABLET | Freq: Four times a day (QID) | ORAL | Status: DC
  Administered 2015-08-03 – 2015-08-04 (×5): 0.2 mg via ORAL
  Filled 2015-08-03 (×5): qty 1

## 2015-08-03 MED ORDER — ACETAMINOPHEN 325 MG PO TABS
650.0000 mg | ORAL_TABLET | ORAL | Status: DC | PRN
  Administered 2015-08-03: 650 mg via ORAL
  Filled 2015-08-03: qty 2

## 2015-08-03 MED ORDER — METHYLERGONOVINE MALEATE 0.2 MG/ML IJ SOLN: 0.2000 mg | Freq: Four times a day (QID) | INTRAMUSCULAR | Status: DC

## 2015-08-03 MED ORDER — METHYLERGONOVINE MALEATE 0.2 MG/ML IJ SOLN
INTRAMUSCULAR | Status: AC
  Filled 2015-08-03: qty 1

## 2015-08-03 MED ORDER — SIMETHICONE 80 MG PO CHEW: 80.0000 mg | CHEWABLE_TABLET | ORAL | Status: DC | PRN

## 2015-08-03 MED ORDER — ONDANSETRON HCL 4 MG/2ML IJ SOLN: 4.0000 mg | INTRAMUSCULAR | Status: DC | PRN

## 2015-08-03 MED ORDER — DIBUCAINE 1 % RE OINT: 1.0000 "application " | TOPICAL_OINTMENT | RECTAL | Status: DC | PRN

## 2015-08-03 MED ORDER — BENZOCAINE-MENTHOL 20-0.5 % EX AERO: 1.0000 "application " | INHALATION_SPRAY | CUTANEOUS | Status: DC | PRN

## 2015-08-03 MED ORDER — LANOLIN HYDROUS EX OINT: TOPICAL_OINTMENT | CUTANEOUS | Status: DC | PRN

## 2015-08-03 MED ORDER — METHYLERGONOVINE MALEATE 0.2 MG/ML IJ SOLN
0.2000 mg | Freq: Once | INTRAMUSCULAR | Status: AC
  Administered 2015-08-03: 0.2 mg via INTRAMUSCULAR

## 2015-08-03 MED ORDER — ZOLPIDEM TARTRATE 5 MG PO TABS: 5.0000 mg | ORAL_TABLET | Freq: Every evening | ORAL | Status: DC | PRN

## 2015-08-03 MED ORDER — OXYTOCIN 10 UNIT/ML IJ SOLN: 2.5000 [IU]/h | INTRAVENOUS | Status: DC | PRN

## 2015-08-03 MED ORDER — PRENATAL MULTIVITAMIN CH
1.0000 | ORAL_TABLET | Freq: Every day | ORAL | Status: DC
  Administered 2015-08-03 – 2015-08-04 (×2): 1 via ORAL
  Filled 2015-08-03 (×2): qty 1

## 2015-08-03 MED ORDER — OXYCODONE-ACETAMINOPHEN 5-325 MG PO TABS: 1.0000 | ORAL_TABLET | ORAL | Status: DC | PRN

## 2015-08-03 MED ORDER — WITCH HAZEL-GLYCERIN EX PADS: 1.0000 "application " | MEDICATED_PAD | CUTANEOUS | Status: DC | PRN

## 2015-08-03 MED ORDER — SENNOSIDES-DOCUSATE SODIUM 8.6-50 MG PO TABS
2.0000 | ORAL_TABLET | ORAL | Status: DC
  Administered 2015-08-03: 2 via ORAL
  Filled 2015-08-03: qty 2

## 2015-08-03 MED ORDER — TETANUS-DIPHTH-ACELL PERTUSSIS 5-2.5-18.5 LF-MCG/0.5 IM SUSP
0.5000 mL | Freq: Once | INTRAMUSCULAR | Status: AC
  Administered 2015-08-04: 0.5 mL via INTRAMUSCULAR
  Filled 2015-08-03: qty 0.5

## 2015-08-03 MED ORDER — DIPHENHYDRAMINE HCL 25 MG PO CAPS: 25.0000 mg | ORAL_CAPSULE | Freq: Four times a day (QID) | ORAL | Status: DC | PRN

## 2015-08-03 MED ORDER — IBUPROFEN 600 MG PO TABS
600.0000 mg | ORAL_TABLET | Freq: Four times a day (QID) | ORAL | Status: DC
  Administered 2015-08-03 – 2015-08-04 (×5): 600 mg via ORAL
  Filled 2015-08-03 (×5): qty 1

## 2015-08-03 NOTE — Progress Notes (Signed)
UR chart review completed.  

## 2015-08-03 NOTE — Progress Notes (Signed)
Lydia Lydia [name] is Lydia [name] 30 y.o. [id number] at [redacted]w[redacted]d by LMP admitted for induction of labor due to Elective at term.  Subjective:   Objective: BP 111/71 mmHg  Pulse 80  Temp(Src) 98.2 F (36.8 C) (Oral)  Resp 18  Ht 4\' 11"  (1.499 m)  Wt 170 lb (77.111 kg)  BMI 34.32 kg/m2  SpO2 100%  LMP 10/29/2014 (Approximate)   Total I/O In: -  Out: 1150 [Urine:1150]  FHT:  FHR: 140 bpm, variability: moderate,  accelerations:  Present,  decelerations:  Absent UC:   regular, every 2-4 minutes SVE:   Dilation: 7 Effacement (%): 70 Station: -2 Exam by:: Lydia Garbe, RN   Labs: Lab Results  Component Value Date   WBC 10.0 08/02/2015   HGB 11.9* 08/02/2015   HCT 35.2* 08/02/2015   MCV 88.2 08/02/2015   PLT 222 08/02/2015    Assessment / Plan: Induction of labor due to term with favorable cervix,  progressing well on pitocin  Labor: Progressing normally Preeclampsia:  none Fetal Wellbeing:  Category I Pain Control:  Epidural I/D:  n/Lydia [name] Anticipated MOD:  NSVD  Lydia Lydia [name] 08/03/2015, 6:40 AM

## 2015-08-03 NOTE — Lactation Note (Signed)
This note was copied from a baby's chart. Lactation Consultation Note Initial visit at 51 hours of age.  Mom reports baby has not been latching, she gave a bottle of formula at 9 this morning. Since then baby has attempted a few latched and has a large spit up with stool.  Discussed offering baby EBM by spoon and mom agreeable.  LC easily expressed 28mls of colostrum given by spoon.  Baby noted to have heart shaped tip to tongue with short thin frenulum near tip of tongue.  Baby does extend tongue to spoon, but is unable to cup on finger consistently with gloved finger assessment.  Baby does not maintain hold of the breast with latching, several sucks noted, but not consistent.  Baby is only 45 hours old at this time.  Discussed allowing baby to relax and get a deep latch to see if baby will improve with latching.  Attempted several positions with laid back working best.  Pierre Bali has flat nipples with compressible breast tissue and may benefit from a hand pump to stimulate nipples.  Mom plans to continue to hand express, spoon feed and offer latching. [facility] LC resources given and discussed.  Encouraged to feed with early cues on demand.  Early newborn behavior discussed.  Mom to call for assist as needed.    Patient Name: Lydia [name] [id number] Date: 08/03/2015 Reason for consult: Initial assessment   Maternal Data Has patient been taught Hand Expression?: Yes Does the patient have breastfeeding experience prior to this delivery?: Yes  Feeding Feeding Type: Breast Fed Length of feed:  (few sucks)  LATCH Score/Interventions Latch: Repeated attempts needed to sustain latch, nipple held in mouth throughout feeding, stimulation needed to elicit sucking reflex. Intervention(s): Adjust position;Assist with latch;Breast massage;Breast compression  Audible Swallowing: None Intervention(s): Skin to skin;Hand expression  Type of Nipple: Flat  Comfort (Breast/Nipple): Soft / non-tender     Hold  (Positioning): Assistance needed to correctly position infant at breast and maintain latch. Intervention(s): Breastfeeding basics reviewed;Support Pillows;Position options;Skin to skin  LATCH Score: 5  Lactation Tools Discussed/Used WIC Program: Yes   Consult Status Consult Status: Follow-up Date: 08/04/15 Follow-up type: In-patient    Shoptaw, Justine Null 08/03/2015, 6:30 PM

## 2015-08-03 NOTE — Anesthesia Postprocedure Evaluation (Signed)
Anesthesia Post Note  Patient: Lydia [name]  Procedure(s) Performed: * No procedures listed *  Patient location during evaluation: Mother Baby Anesthesia Type: Epidural Level of consciousness: awake, awake and alert and oriented Pain management: pain level controlled Vital Signs Assessment: post-procedure vital signs reviewed and stable Respiratory status: spontaneous breathing Cardiovascular status: stable Postop Assessment: patient able to bend at knees, no signs of nausea or vomiting and adequate PO intake Anesthetic complications: no    Last Vitals:  Filed Vitals:   08/03/15 0846 08/03/15 0900  BP: 104/74 111/75  Pulse: 93 79  Temp:    Resp:      Last Pain:  Filed Vitals:   08/03/15 0941  PainSc: 0-No pain                 Sunday Klos Hristova

## 2015-08-03 NOTE — Progress Notes (Signed)
Lydia [name] is a 30 y.o. [id number] at [redacted]w[redacted]d by LMP admitted for induction of labor due to Elective at term.  Subjective:   Objective: BP 114/69 mmHg  Pulse 82  Temp(Src) 98.2 F (36.8 C) (Oral)  Resp 18  Ht 4\' 11"  (1.499 m)  Wt 170 lb (77.111 kg)  BMI 34.32 kg/m2  SpO2 100%  LMP 10/29/2014 (Approximate) I/O last 3 completed shifts: In: -  Out: 1150 [Urine:1150]    FHT:  FHR: 120 bpm, variability: moderate,  accelerations:  Present,  decelerations:  Present variables UC:   regular, every 2-3 minutes SVE:   Dilation: 10 Effacement (%): 70 Station: -1 Exam by:: [place] Stalling RN; Helane Gunther RN  Labs: Lab Results  Component Value Date   WBC 10.0 08/02/2015   HGB 11.9* 08/02/2015   HCT 35.2* 08/02/2015   MCV 88.2 08/02/2015   PLT 222 08/02/2015    Assessment / Plan: Induction of labor due to term with favorable cervix,  progressing well on pitocin  Labor: Progressing normally Preeclampsia:  none Fetal Wellbeing:  Category I Pain Control:  Epidural I/D:  n/a Anticipated MOD:  NSVD  HARPER,CHARLES A 08/03/2015, 7:24 AM

## 2015-08-04 LAB — CBC
HEMATOCRIT: 32.7 % — AB (ref 36.0–46.0)
Hemoglobin: 11.1 g/dL — ABNORMAL LOW (ref 12.0–15.0)
MCH: 30.1 pg (ref 26.0–34.0)
MCHC: 33.9 g/dL (ref 30.0–36.0)
MCV: 88.6 fL (ref 78.0–100.0)
Platelets: 218 10*3/uL (ref 150–400)
RBC: 3.69 MIL/uL — ABNORMAL LOW (ref 3.87–5.11)
RDW: 14.1 % (ref 11.5–15.5)
WBC: 14.9 10*3/uL — ABNORMAL HIGH (ref 4.0–10.5)

## 2015-08-04 MED ORDER — OXYCODONE-ACETAMINOPHEN 5-325 MG PO TABS: 1.0000 | ORAL_TABLET | ORAL | Status: DC | PRN

## 2015-08-04 MED ORDER — IBUPROFEN 600 MG PO TABS: 600.0000 mg | ORAL_TABLET | Freq: Four times a day (QID) | ORAL | Status: DC | PRN

## 2015-08-04 NOTE — Progress Notes (Signed)
Post Partum Day 1 Subjective: no complaints  Objective: Blood pressure 108/72, pulse 68, temperature 97.8 F (36.6 C), temperature source Oral, resp. rate 20, height 4\' 11"  (1.499 m), weight 170 lb (77.111 kg), last menstrual period 10/29/2014, SpO2 99 %, unknown if currently breastfeeding.  Physical Exam:  General: alert and no distress Lochia: appropriate Uterine Fundus: firm Incision: none DVT Evaluation: No evidence of DVT seen on physical exam.   Recent Labs  08/02/15 1015  HGB 11.9*  HCT 35.2*    Assessment/Plan: Discharge home.  F/U in office in 2 weeks.   LOS: 2 days   Shermaine Brigham A 08/04/2015, 5:57 AM

## 2015-08-04 NOTE — Progress Notes (Signed)
Post Partum Day #1 Subjective: no complaints, up ad lib, voiding and tolerating PO  Objective: Blood pressure 106/67, pulse 71, temperature 97.8 F (36.6 C), temperature source Oral, resp. rate 18, height 4\' 11"  (1.499 m), weight 170 lb (77.111 kg), last menstrual period 10/29/2014, SpO2 99 %, unknown if currently breastfeeding.  Physical Exam:  General: alert, cooperative and no distress Lochia: appropriate Uterine Fundus: firm Incision: none DVT Evaluation: No evidence of DVT seen on physical exam. No cords or calf tenderness. No significant calf/ankle edema.   Recent Labs  08/02/15 1015 08/04/15 0603  HGB 11.9* 11.1*  HCT 35.2* 32.7*    Assessment/Plan: Plan for discharge tomorrow   LOS: 2 days   Morene Crocker, CNM 08/04/2015, 8:33 AM

## 2015-08-04 NOTE — Discharge Summary (Signed)
Obstetric Discharge Summary Reason for Admission: induction of labor Prenatal Procedures: ultrasound Intrapartum Procedures: spontaneous vaginal delivery Postpartum Procedures: none Complications-Operative and Postpartum: none HEMOGLOBIN  Date Value Ref Range Status  08/02/2015 11.9* 12.0 - 15.0 g/dL Final   HCT  Date Value Ref Range Status  08/02/2015 35.2* 36.0 - 46.0 % Final    Physical Exam:  General: alert and no distress Lochia: appropriate Uterine Fundus: firm Incision: none DVT Evaluation: No evidence of DVT seen on physical exam.  Discharge Diagnoses: Term Pregnancy-delivered  Discharge Information: Date: 08/04/2015 Activity: pelvic rest Diet: routine Medications: PNV, Ibuprofen, Colace and Percocet Condition: stable Instructions: refer to practice specific booklet Discharge to: home Follow-up Information    Follow up with Akshaj Besancon A, MD. Schedule an appointment as soon as possible for a visit in 2 weeks.   Specialty:  Obstetrics and Gynecology   Contact information:   [address] [place] [address] [phone]       Newborn Data: Live born female  Birth Weight: 8 lb 1.5 oz (3671 g) APGAR: 9, 9  Home with mother.  Allexa Acoff A 08/04/2015, 6:14 AM

## 2015-08-04 NOTE — Clinical Social Work Maternal (Signed)
CLINICAL SOCIAL WORK MATERNAL/CHILD NOTE  Patient Details  Name: Lydia [name] MRN: [id number] Date of Birth: Mar 09, 1986  Date:  08/04/2015  Clinical Social Worker Initiating Note:  Lucita Ferrara MSW, LCSW Date/ Time Initiated:  08/04/15/1120     Child's Name:  Reece Agar   Legal Guardian:  Kalaysia Lorayne Marek and Donney Rankins  Need for Interpreter:  None   Date of Referral:  08/03/15     Reason for Referral:  History of anxiety  Referral Source:  [facility]   Address:  [address], [place] [address]  Phone number:  [phone]   Household Members:  Minor Children, Spouse   Natural Supports (not living in the home):  Extended Family, Immediate Family   Professional Supports: None   Employment: Homemaker   Type of Work:   N/A  Education:    N/A  Pensions consultant:  [employer]   Other Resources:  [employer], Physicist, medical    Cultural/Religious Considerations Which May Impact Care:  None reported  Strengths:  Ability to meet basic needs , Pediatrician chosen , Home prepared for child    Risk Factors/Current Problems:   1. Mental Health Concerns -- onset of anxiety during pregnancy.    Cognitive State:  Able to Concentrate , Alert , Goal Oriented , Linear Thinking    Mood/Affect:  Calm , Comfortable    CSW Assessment:  CSW received request for consult due to MOB presenting with a history of anxiety.  MOB presented in a pleasant mood, and displayed a full range in affect. MOB was noted to be receptive to the visit and easily engaged.  MOB expressed eagerness and readiness for discharge. She shared that she is looking forward to returning home, caring for other children, and being in her own space. MOB confirmed that the home is prepared for the infant, all basic needs are met, and that she has a supportive family.  MOB denied prior history of depression, anxiety, or perinatal mood disorders until this pregnancy. She stated that she noted the onset of anxiety,  and shared that it often occurred when she was in a public setting with a large number of other people.  MOB discussed how it would even occur when she was in church.  MOB shared that it was a "scary" situation since she has never felt this way before and there were other stressors or factors that may have led to onset of anxiety.  She stated that she would feel as though the walls were closing in on her, and she noted sweaty palms.  MOB reported that she fainted at church, and this is what led to her talking to her doctor.  MOB stated that she was prescribed medication (Vistaril), but reported that it did not help.     MOB shared that she has not felt any anxiety since the infant has been born, but also confirmed that she has not encountered any potential triggers.  MOB shared that she is hopeful that it was only pregnancy related, but also was receptive to CSW feedback that it may be related to a perinatal mood disorder. MOB informed of potential treatments that may be available to her, but she stated that she is not interested in pursuing any additional interventions at this time. MOB expressed appreciation for the education, and shared that she feels better that there may be a different medication that may be better for her. MOB shared intention to follow up with her medical provider if she notes ongoing anxiety.  She was receptive to exploring relaxation techniques and self-talk that she can utilize when she notes increase in anxiety.  MOB confirmed awareness of her triggers, and how this knowledge can help her to potential be proactive in using coping skills when she anticipates feelings being triggered.   CSW Plan/Description:  Patient/Family Education , No Further Intervention Required/No Barriers to Discharge    Sharyl Nimrod 08/04/2015, 1:37 PM

## 2015-08-04 NOTE — Lactation Note (Signed)
This note was copied from a baby's chart. Lactation Consultation Note  Patient Name: Lydia [name] [id number] Date: 08/04/2015   Visited with Mom on day of discharge, baby 71 hrs old.  He has been taking formula by bottles due to difficulty latch (see Jana's note).  Baby taking 5-9 ml per feeding.  Volume parameters handout given, and encouraged to increase amount each day.  Mom has not pumped yet, and states she will try using a manual breast pump at home.  Mom has [place] and offered a loaner pump until she can get one from [facility].  Encouraged regular pumping at each feeding, >8 times in 24 hrs, 15-20 minutes.  Encouraged using manual breast expression and offering her expressed breast milk to baby.  Encouraged continued skin to skin, and feeding on cue.  Offered OP lactation appointment, Mom states she will call for one after she gets home.  Offered assistance on the breast prior to discharge.  Will call prn.   Lydia [name] 08/04/2015, 10:07 AM

## 2015-08-08 ENCOUNTER — Ambulatory Visit (INDEPENDENT_AMBULATORY_CARE_PROVIDER_SITE_OTHER): Payer: Medicaid Other | Admitting: Certified Nurse Midwife

## 2015-08-08 ENCOUNTER — Encounter: Payer: Self-pay | Admitting: Certified Nurse Midwife

## 2015-08-08 VITALS — BP 113/80 | HR 80 | Temp 98.4°F | Wt 162.0 lb

## 2015-08-08 DIAGNOSIS — N61 Mastitis without abscess: Secondary | ICD-10-CM | POA: Insufficient documentation

## 2015-08-08 MED ORDER — IBUPROFEN 800 MG PO TABS: 800.0000 mg | ORAL_TABLET | Freq: Three times a day (TID) | ORAL | Status: DC | PRN

## 2015-08-08 MED ORDER — DIPHENHYDRAMINE HCL 25 MG PO TABS: 50.0000 mg | ORAL_TABLET | Freq: Every day | ORAL | Status: DC

## 2015-08-08 MED ORDER — CEPHALEXIN 500 MG PO CAPS: 500.0000 mg | ORAL_CAPSULE | Freq: Three times a day (TID) | ORAL | Status: DC

## 2015-08-08 NOTE — Progress Notes (Signed)
Patient ID: Lydia [name], female   DOB: 10/02/1985, 30 y.o.   MRN: [id number]  Subjective:     Rashell A Channing is a 30 y.o. female who presents for a postpartum visit. She is 1 weeks postpartum following a spontaneous vaginal delivery. I have fully reviewed the prenatal and intrapartum course. The delivery was at 17 gestational weeks. Outcome: spontaneous vaginal delivery. Anesthesia: epidural. Postpartum course has been normal. Baby's course has been normal. Baby is feeding by bottle - Similac Advance. Bleeding staining only and brown. Bowel function is normal. Bladder function is normal. Patient is not sexually active. Contraception method is abstinence. Postpartum depression screening: negative.  Tobacco, alcohol and substance abuse history reviewed.  Adult immunizations reviewed including TDAP, rubella and varicella.  Breast pain started yesterday.    The following portions of the patient's history were reviewed and updated as appropriate: allergies, current medications, past family history, past medical history, past social history, past surgical history and problem list.  Review of Systems Pertinent items noted in HPI and remainder of comprehensive ROS otherwise negative.   Objective:    BP 113/80 mmHg  Pulse 80  Temp(Src) 98.4 F (36.9 C)  Wt 162 lb (73.483 kg)  LMP 10/29/2014 (Approximate)  Breastfeeding? No  General:  alert, cooperative and no distress   Breasts:  positive findings: erythemic, plugged ducts, edema present  Lungs: clear to auscultation bilaterally  Heart:  regular rate and rhythm, S1, S2 normal, no murmur, click, rub or gallop  Abdomen: soft, non-tender; bowel sounds normal; no masses,  no organomegaly   Vulva:  not evaluated  Vagina: not evaluated  Cervix:  not evaluated  Corpus: normal and appropriate for 1 week postpartum, above symphysis pubis below unbilicus about 6 below  Adnexa:  not evaluated  Rectal Exam: Not performed.          50% of 15 min visit  spent on counseling and coordination of care.  Assessment:     Abnormal 1 week postpartum exam. Pap smear not done at today's visit.    Bilateral mastitis  Plan:    1. Contraception: abstinence 2. Oral antibiotic therapy initiated.  Strict F/U precautions discussed with patient.   3. Follow up in: a few days if not improved, 1 week for 2 week postpartum exam or as needed.  2hr GTT for h/o GDM/screening for DM q 3 yrs per ADA recommendations Preconception counseling provided Healthy lifestyle practices reviewed

## 2015-08-21 ENCOUNTER — Encounter: Payer: Self-pay | Admitting: Obstetrics

## 2015-08-21 ENCOUNTER — Ambulatory Visit (INDEPENDENT_AMBULATORY_CARE_PROVIDER_SITE_OTHER): Payer: Medicaid Other | Admitting: Obstetrics

## 2015-08-21 DIAGNOSIS — Z30011 Encounter for initial prescription of contraceptive pills: Secondary | ICD-10-CM

## 2015-08-21 DIAGNOSIS — O9089 Other complications of the puerperium, not elsewhere classified: Secondary | ICD-10-CM

## 2015-08-21 DIAGNOSIS — R52 Pain, unspecified: Secondary | ICD-10-CM

## 2015-08-21 LAB — POCT URINALYSIS DIPSTICK
Bilirubin, UA: NEGATIVE
Glucose, UA: NEGATIVE
Ketones, UA: NEGATIVE
Leukocytes, UA: NEGATIVE
NITRITE UA: NEGATIVE
PH UA: 8
UROBILINOGEN UA: NEGATIVE

## 2015-08-21 MED ORDER — OXYCODONE HCL 10 MG PO TABS: 10.0000 mg | ORAL_TABLET | Freq: Four times a day (QID) | ORAL | Status: DC | PRN

## 2015-08-21 MED ORDER — NORETHIN ACE-ETH ESTRAD-FE 1-20 MG-MCG(24) PO TABS: 1.0000 | ORAL_TABLET | Freq: Every day | ORAL | Status: DC

## 2015-08-21 NOTE — Progress Notes (Signed)
Subjective:     Lydia [name] is a 30 y.o. female who presents for a postpartum visit. She is 3 weeks postpartum following a spontaneous vaginal delivery. I have fully reviewed the prenatal and intrapartum course. The delivery was at 66 gestational weeks. Outcome: spontaneous vaginal delivery. Anesthesia: epidural. Postpartum course has been normal. Baby's course has been normal. Baby is feeding by bottle - Similac Advance. Bleeding thin lochia. Bowel function is normal. Bladder function is normal. Patient is not sexually active. Contraception method is abstinence. Postpartum depression screening: negative.  Tobacco, alcohol and substance abuse history reviewed.  Adult immunizations reviewed including TDAP, rubella and varicella.  The following portions of the patient's history were reviewed and updated as appropriate: allergies, current medications, past family history, past medical history, past social history, past surgical history and problem list.  Review of Systems A comprehensive review of systems was negative.   Objective:    BP 100/63 mmHg  Pulse 68  Temp(Src) 98 F (36.7 C)  Wt 155 lb (70.308 kg)   PE:  Deferred         100% of 15 min visit spent on counseling and coordination of care.  Assessment:    ~ 3 weeks postpartum.  Doing well.  Contraceptive counseling and advice.  Wants OCP's.  Bottle feeding.  Plan:    1. Contraception: OCP (estrogen/progesterone) 2. Loestrin 24 Fe Rx 3. Follow up in: 4 weeks or as needed.   Healthy lifestyle practices reviewed

## 2015-09-19 ENCOUNTER — Ambulatory Visit (INDEPENDENT_AMBULATORY_CARE_PROVIDER_SITE_OTHER): Payer: Medicaid Other | Admitting: Obstetrics

## 2015-09-19 ENCOUNTER — Encounter: Payer: Self-pay | Admitting: Obstetrics

## 2015-09-19 DIAGNOSIS — Z3041 Encounter for surveillance of contraceptive pills: Secondary | ICD-10-CM

## 2015-09-19 DIAGNOSIS — G44019 Episodic cluster headache, not intractable: Secondary | ICD-10-CM

## 2015-09-19 DIAGNOSIS — Z1389 Encounter for screening for other disorder: Secondary | ICD-10-CM

## 2015-09-19 LAB — POCT URINALYSIS DIPSTICK
Bilirubin, UA: NEGATIVE
Glucose, UA: NEGATIVE
KETONES UA: NEGATIVE
Leukocytes, UA: NEGATIVE
Nitrite, UA: NEGATIVE
PH UA: 5
PROTEIN UA: NEGATIVE
RBC UA: NEGATIVE
SPEC GRAV UA: 1.015
Urobilinogen, UA: NEGATIVE

## 2015-09-19 NOTE — Progress Notes (Signed)
Subjective:     Lydia [name] is a 30 y.o. female who presents for a postpartum visit. She is 6 weeks postpartum following a spontaneous vaginal delivery. I have fully reviewed the prenatal and intrapartum course. The delivery was at 32 gestational weeks. Outcome: spontaneous vaginal delivery. Anesthesia: epidural. Postpartum course has been normal. Baby's course has been normal. Baby is feeding by breast. Bleeding no bleeding. Bowel function is normal. Bladder function is normal. Patient is not sexually active. Contraception method is abstinence. Postpartum depression screening: negative.  Tobacco, alcohol and substance abuse history reviewed.  Adult immunizations reviewed including TDAP, rubella and varicella.  The following portions of the patient's history were reviewed and updated as appropriate: allergies, current medications, past family history, past medical history, past social history, past surgical history and problem list.  Review of Systems A comprehensive review of systems was negative.   Objective:    Wt 150 lb (68.04 kg)  General:  alert and no distress   Breasts:  inspection negative, no nipple discharge or bleeding, no masses or nodularity palpable  Lungs: clear to auscultation bilaterally  Heart:  regular rate and rhythm, S1, S2 normal, no murmur, click, rub or gallop  Abdomen: soft, non-tender; bowel sounds normal; no masses,  no organomegaly   Vulva:  normal  Vagina: normal vagina  Cervix:  no cervical motion tenderness  Corpus: normal size, contour, position, consistency, mobility, non-tender  Adnexa:  no mass, fullness, tenderness  Rectal Exam: Not performed.           Assessment:     Normal postpartum exam. Pap smear not done at today's visit.     Severe Headaches  Plan:    1. Contraception: OCP (estrogen/progesterone) 2. Continue PNV's 3. Follow up in: 6 weeks or as needed.   4. Referred to Neurology for HA's  Healthy lifestyle practices reviewed

## 2015-10-23 ENCOUNTER — Ambulatory Visit: Payer: Medicaid Other | Admitting: Neurology

## 2015-10-31 ENCOUNTER — Ambulatory Visit: Payer: Self-pay | Admitting: Obstetrics

## 2015-11-15 ENCOUNTER — Ambulatory Visit: Payer: Self-pay | Admitting: Certified Nurse Midwife

## 2015-11-22 ENCOUNTER — Ambulatory Visit: Payer: Self-pay | Admitting: Obstetrics

## 2015-12-01 ENCOUNTER — Ambulatory Visit: Payer: Medicaid Other | Admitting: Neurology

## 2016-02-12 ENCOUNTER — Other Ambulatory Visit: Payer: Self-pay | Admitting: Obstetrics

## 2016-02-12 DIAGNOSIS — K219 Gastro-esophageal reflux disease without esophagitis: Secondary | ICD-10-CM

## 2016-02-27 ENCOUNTER — Encounter: Payer: Self-pay | Admitting: Obstetrics

## 2016-02-27 ENCOUNTER — Ambulatory Visit (INDEPENDENT_AMBULATORY_CARE_PROVIDER_SITE_OTHER): Payer: Medicaid Other | Admitting: Obstetrics

## 2016-02-27 ENCOUNTER — Other Ambulatory Visit (HOSPITAL_COMMUNITY)
Admission: RE | Admit: 2016-02-27 | Discharge: 2016-02-27 | Disposition: A | Discharge status: Home / Self Care | Payer: Medicaid Other | Source: Ambulatory Visit | Attending: Obstetrics | Admitting: Obstetrics

## 2016-02-27 VITALS — BP 103/71 | HR 81 | Temp 98.1°F | Wt 146.2 lb

## 2016-02-27 DIAGNOSIS — R102 Pelvic and perineal pain: Secondary | ICD-10-CM | POA: Diagnosis not present

## 2016-02-27 DIAGNOSIS — Z30011 Encounter for initial prescription of contraceptive pills: Secondary | ICD-10-CM

## 2016-02-27 DIAGNOSIS — N760 Acute vaginitis: Secondary | ICD-10-CM | POA: Insufficient documentation

## 2016-02-27 DIAGNOSIS — G8929 Other chronic pain: Secondary | ICD-10-CM | POA: Diagnosis not present

## 2016-02-27 DIAGNOSIS — Z01419 Encounter for gynecological examination (general) (routine) without abnormal findings: Secondary | ICD-10-CM | POA: Insufficient documentation

## 2016-02-27 DIAGNOSIS — Z3009 Encounter for other general counseling and advice on contraception: Secondary | ICD-10-CM

## 2016-02-27 DIAGNOSIS — Z113 Encounter for screening for infections with a predominantly sexual mode of transmission: Secondary | ICD-10-CM | POA: Diagnosis present

## 2016-02-27 MED ORDER — NORETHINDRONE-ETH ESTRADIOL 1-35 MG-MCG PO TABS: 1.0000 | ORAL_TABLET | Freq: Every day | ORAL | 11 refills | Status: DC

## 2016-02-27 MED ORDER — OXYCODONE-ACETAMINOPHEN 10-325 MG PO TABS: 1.0000 | ORAL_TABLET | ORAL | 0 refills | Status: DC | PRN

## 2016-02-27 NOTE — Addendum Note (Signed)
Addended by: Baltazar Najjar A on: 02/27/2016 04:46 PM   Modules accepted: Orders

## 2016-02-27 NOTE — Progress Notes (Addendum)
Subjective:        Lydia [name] is a 30 y.o. female here for a routine exam.  Current complaints: Painful intercourse.    Personal health questionnaire:  Is [name], have a family history of breast and/or ovarian cancer: no Is there a family history of uterine cancer diagnosed at age < 68, gastrointestinal cancer, urinary tract cancer, family member who is a Field seismologist syndrome-associated carrier: no Is the patient overweight and hypertensive, family history of diabetes, personal history of gestational diabetes, preeclampsia or PCOS: no Is patient over 78, have PCOS,  family history of premature CHD under age 55, diabetes, smoke, have hypertension or peripheral artery disease:  no At any time, has a partner hit, kicked or otherwise hurt or frightened you?: no Over the past 2 weeks, have you felt down, depressed or hopeless?: no Over the past 2 weeks, have you felt little interest or pleasure in doing things?:no   Gynecologic History No LMP recorded. Contraception: none Last Pap: 2015. Results were: normal Last mammogram: n/a. Results were: normal  Obstetric History OB History  Gravida Para Term Preterm AB Living  4 3 3  0 1 3  SAB TAB Ectopic Multiple Live Births  1 0 0 0 3    # Outcome Date GA Lbr Len/2nd Weight Sex Delivery Anes PTL Lv  4 Term 08/03/15 [redacted]w[redacted]d 20:25 / 00:40 8 lb 1.5 oz (3.671 kg) M Vag-Spont EPI  LIV  3 Term 12/30/10 [redacted]w[redacted]d 10:50 / 00:29 6 lb 1.4 oz (2.761 kg) F Vag-Spont EPI  LIV     Birth Comments: left arm below elbow missing  2 Term 11/28/04 [redacted]w[redacted]d  6 lb 14 oz (3.118 kg) F Vag-Spont EPI  LIV  1 SAB               Past Medical History:  Diagnosis Date  . Anxiety   . Chronic headaches    none x 2-3 yrs  . Infection    UTI  . Meningitis   . Ovarian cyst     Past Surgical History:  Procedure Laterality Date  . CHOLECYSTECTOMY       Current Outpatient Prescriptions:  .  Norethindrone Acetate-Ethinyl Estrad-FE (LOESTRIN 24 FE) 1-20  MG-MCG(24) tablet, Take 1 tablet by mouth daily., Disp: 1 Package, Rfl: 11 .  ranitidine (ZANTAC) 150 MG tablet, TAKE 1 TABLET(150 MG) BY MOUTH TWICE DAILY, Disp: 60 tablet, Rfl: 0 Allergies  Allergen Reactions  . Penicillins Hives    Has patient had a PCN reaction causing immediate rash, facial/tongue/throat swelling, SOB or lightheadedness with hypotension: Yes Has patient had a PCN reaction causing severe rash involving mucus membranes or skin necrosis: Yes Has patient had a PCN reaction that required hospitalization no Has patient had a PCN reaction occurring within the last 10 years: No If all of the above answers are "NO", then may proceed with Cephalosporin use.    Social History  Substance Use Topics  . Smoking status: Never Smoker  . Smokeless tobacco: Never Used  . Alcohol use No    Family History  Problem Relation Age of Onset  . Hypertension Mother   . Breast cancer Maternal Aunt   . Birth defects Daughter     Limb reduction defect      Review of Systems  Constitutional: negative for fatigue and weight loss Respiratory: negative for cough and wheezing Cardiovascular: negative for chest pain, fatigue and palpitations Gastrointestinal: negative for abdominal pain and change in bowel habits Musculoskeletal:negative for  myalgias Neurological: negative for gait problems and tremors Behavioral/Psych: negative for abusive relationship, depression Endocrine: negative for temperature intolerance   Genitourinary:negative for abnormal menstrual periods, genital lesions, hot flashes, sexual problems and vaginal discharge Integument/breast: negative for breast lump, breast tenderness, nipple discharge and skin lesion(s)    Objective:       BP 103/71   Pulse 81   Temp 98.1 F (36.7 C) (Oral)   Wt 146 lb 3.2 oz (66.3 kg)   Breastfeeding? No   BMI 29.53 kg/m  General:   alert  Skin:   no rash or abnormalities  Lungs:   clear to auscultation bilaterally  Heart:    regular rate and rhythm, S1, S2 normal, no murmur, click, rub or gallop  Breasts:   normal without suspicious masses, skin or nipple changes or axillary nodes  Abdomen:  normal findings: no organomegaly, soft, non-tender and no hernia  Pelvis:  External genitalia: normal general appearance Urinary system: urethral meatus normal and bladder without fullness, nontender Vaginal: normal without tenderness, induration or masses Cervix: normal appearance Adnexa: normal bimanual exam Uterus: anteverted and non-tender, normal size   Lab Review Urine pregnancy test Labs reviewed yes Radiologic studies reviewed yes  50% of 20 min visit spent on counseling and coordination of care.   Assessment:    Healthy female exam.    Chronic pelvic pain   Plan:   Ultrasound ordered Referred to Maryland Pink MD for management of chronic pelvic pain  Education reviewed: calcium supplements, depression evaluation, low fat, low cholesterol diet, self breast exams and weight bearing exercise. Contraception: OCP (estrogen/progesterone). Follow up in: 1 year.   No orders of the defined types were placed in this encounter.  No orders of the defined types were placed in this encounter.

## 2016-02-29 LAB — CERVICOVAGINAL ANCILLARY ONLY
CHLAMYDIA, DNA PROBE: NEGATIVE
NEISSERIA GONORRHEA: NEGATIVE
TRICH (WINDOWPATH): NEGATIVE

## 2016-02-29 LAB — CYTOLOGY - PAP: DIAGNOSIS: NEGATIVE

## 2016-03-04 ENCOUNTER — Ambulatory Visit (HOSPITAL_COMMUNITY): Payer: Medicaid Other

## 2016-03-04 ENCOUNTER — Encounter (HOSPITAL_COMMUNITY): Payer: Self-pay

## 2016-03-04 LAB — CERVICOVAGINAL ANCILLARY ONLY
BACTERIAL VAGINITIS: POSITIVE — AB
Candida vaginitis: NEGATIVE

## 2016-03-05 ENCOUNTER — Other Ambulatory Visit: Payer: Self-pay | Admitting: Obstetrics

## 2016-03-05 DIAGNOSIS — B9689 Other specified bacterial agents as the cause of diseases classified elsewhere: Secondary | ICD-10-CM

## 2016-03-05 DIAGNOSIS — N760 Acute vaginitis: Principal | ICD-10-CM

## 2016-03-05 MED ORDER — METRONIDAZOLE 500 MG PO TABS: 500.0000 mg | ORAL_TABLET | Freq: Two times a day (BID) | ORAL | 2 refills | Status: DC

## 2016-03-08 ENCOUNTER — Ambulatory Visit (HOSPITAL_COMMUNITY): Payer: Medicaid Other

## 2016-03-12 ENCOUNTER — Ambulatory Visit: Payer: Medicaid Other | Admitting: Obstetrics

## 2016-03-13 ENCOUNTER — Ambulatory Visit (HOSPITAL_COMMUNITY)
Admission: RE | Admit: 2016-03-13 | Discharge: 2016-03-13 | Disposition: A | Discharge status: Home / Self Care | Payer: Medicaid Other | Source: Ambulatory Visit | Attending: Obstetrics | Admitting: Obstetrics

## 2016-03-13 DIAGNOSIS — N941 Unspecified dyspareunia: Secondary | ICD-10-CM | POA: Diagnosis not present

## 2016-03-13 DIAGNOSIS — R102 Pelvic and perineal pain: Secondary | ICD-10-CM | POA: Insufficient documentation

## 2016-03-13 DIAGNOSIS — G8929 Other chronic pain: Secondary | ICD-10-CM | POA: Diagnosis not present

## 2016-03-26 ENCOUNTER — Ambulatory Visit (INDEPENDENT_AMBULATORY_CARE_PROVIDER_SITE_OTHER): Payer: Medicaid Other | Admitting: Obstetrics

## 2016-03-26 ENCOUNTER — Encounter: Payer: Self-pay | Admitting: Obstetrics

## 2016-03-26 VITALS — BP 100/69 | HR 72 | Temp 97.8°F | Wt 148.7 lb

## 2016-03-26 DIAGNOSIS — R102 Pelvic and perineal pain: Secondary | ICD-10-CM

## 2016-03-26 DIAGNOSIS — J301 Allergic rhinitis due to pollen: Secondary | ICD-10-CM | POA: Diagnosis not present

## 2016-03-26 MED ORDER — LORATADINE 10 MG PO TABS
10.0000 mg | ORAL_TABLET | Freq: Every day | ORAL | 11 refills | Status: DC
Start: 2016-03-26 — End: 2016-06-16

## 2016-03-26 NOTE — Progress Notes (Signed)
Patient ID: Lydia [name], female   DOB: April 09, 1986, 30 y.o.   MRN: [id number]  Chief Complaint  Patient presents with  . Follow-up    Follow up on u/s    HPI Lydia [name] is a 30 y.o. female.  Presents for ultrasound results.  Has history of pelvic pain and dyspareunia, which has resolved since starting OCP's. HPI  Past Medical History:  Diagnosis Date  . Anxiety   . Chronic headaches    none x 2-3 yrs  . Infection    UTI  . Meningitis   . Ovarian cyst     Past Surgical History:  Procedure Laterality Date  . CHOLECYSTECTOMY      Family History  Problem Relation Age of Onset  . Hypertension Mother   . Breast cancer Maternal Aunt   . Birth defects Daughter     Limb reduction defect    Social History Social History  Substance Use Topics  . Smoking status: Never Smoker  . Smokeless tobacco: Never Used  . Alcohol use No    Allergies  Allergen Reactions  . Penicillins Hives    Has patient had a PCN reaction causing immediate rash, facial/tongue/throat swelling, SOB or lightheadedness with hypotension: Yes Has patient had a PCN reaction causing severe rash involving mucus membranes or skin necrosis: Yes Has patient had a PCN reaction that required hospitalization no Has patient had a PCN reaction occurring within the last 10 years: No If all of the above answers are "NO", then may proceed with Cephalosporin use.    Current Outpatient Prescriptions  Medication Sig Dispense Refill  . Norethindrone Acetate-Ethinyl Estrad-FE (LOESTRIN 24 FE) 1-20 MG-MCG(24) tablet Take 1 tablet by mouth daily. 1 Package 11  . norethindrone-ethinyl estradiol 1/35 ([place] 1/35, 28,) tablet Take 1 tablet by mouth daily. 1 Package 11  . oxyCODONE-acetaminophen (PERCOCET) 10-325 MG tablet Take 1 tablet by mouth every 4 (four) hours as needed for pain. 30 tablet 0  . ranitidine (ZANTAC) 150 MG tablet TAKE 1 TABLET(150 MG) BY MOUTH TWICE DAILY 60 tablet 0  . loratadine (CLARITIN) 10 MG  tablet Take 1 tablet (10 mg total) by mouth daily. 30 tablet 11  . metroNIDAZOLE (FLAGYL) 500 MG tablet Take 1 tablet (500 mg total) by mouth 2 (two) times daily. (Patient not taking: Reported on 03/26/2016) 14 tablet 2   No current facility-administered medications for this visit.     Review of Systems Review of Systems Constitutional: negative for fatigue and weight loss Respiratory: negative for cough and wheezing Cardiovascular: negative for chest pain, fatigue and palpitations Gastrointestinal: negative for abdominal pain and change in bowel habits Genitourinary:negative Integument/breast: negative for nipple discharge Musculoskeletal:negative for myalgias Neurological: negative for gait problems and tremors Behavioral/Psych: negative for abusive relationship, depression Endocrine: negative for temperature intolerance      Blood pressure 100/69, pulse 72, temperature 97.8 F (36.6 C), temperature source Oral, weight 148 lb 11.2 oz (67.4 kg), not currently breastfeeding.  Physical Exam Physical Exam:  Deferred  >50% of 10 min visit spent on counseling and coordination of care.    Data Reviewed Ultrasound:  Normal uterus, ovaries and tubes  Assessment     Pelvic pain and dyspareunia, resolved since starting OCP's Ultrasound unremarkable    Plan    Referred to Dr. Maryland Pink for chronic pelvic pain evaluation and management F/U prn  No orders of the defined types were placed in this encounter.  Meds ordered this encounter  Medications  . loratadine (CLARITIN)  10 MG tablet    Sig: Take 1 tablet (10 mg total) by mouth daily.    Dispense:  30 tablet    Refill:  11

## 2016-06-05 ENCOUNTER — Other Ambulatory Visit: Payer: Self-pay | Admitting: Obstetrics

## 2016-06-05 DIAGNOSIS — R52 Pain, unspecified: Secondary | ICD-10-CM

## 2016-06-16 ENCOUNTER — Encounter (HOSPITAL_COMMUNITY): Payer: Self-pay | Admitting: Emergency Medicine

## 2016-06-16 ENCOUNTER — Ambulatory Visit (HOSPITAL_COMMUNITY)
Admission: EM | Admit: 2016-06-16 | Discharge: 2016-06-16 | Disposition: A | Discharge status: Home / Self Care | Payer: Medicaid Other | Attending: Family Medicine | Admitting: Family Medicine

## 2016-06-16 DIAGNOSIS — B9789 Other viral agents as the cause of diseases classified elsewhere: Secondary | ICD-10-CM

## 2016-06-16 DIAGNOSIS — J069 Acute upper respiratory infection, unspecified: Secondary | ICD-10-CM

## 2016-06-16 MED ORDER — BENZONATATE 100 MG PO CAPS: 100.0000 mg | ORAL_CAPSULE | Freq: Three times a day (TID) | ORAL | 0 refills | Status: DC

## 2016-06-16 NOTE — ED Triage Notes (Signed)
The patient presented to the [facility] with a complaint of a cough, bilateral ear pain and a headache x 5 days.

## 2016-06-16 NOTE — Discharge Instructions (Signed)
You most likely have a viral URI, I advise rest, plenty of fluids and management of symptoms with over the counter medicines. For symptoms you may take Tylenol as needed every 4-6 hours for body aches or fever, not to exceed 4,000 mg a day, Take mucinex or mucinex DM ever 12 hours with a full glass of water, you may use an inhaled steroid such as Flonase, 2 sprays each nostril once a day for congestion, or an antihistamine such as Claritin or Zyrtec once a day. For cough, I have prescribed a medication called Tessalon. Take 1 tablet every 8 hours as needed for your cough. Should your symptoms worsen or fail to resolve, follow up with your primary care provider or return to clinic.  °

## 2016-06-16 NOTE — ED Provider Notes (Signed)
CSN: [id number]     Arrival date & time 06/16/16  1214 History   First MD Initiated Contact with Patient 06/16/16 1316     Chief Complaint  Patient presents with  . Cough   (Consider location/radiation/quality/duration/timing/severity/associated sxs/prior Treatment) The history is provided by the patient.  Cough  Cough characteristics:  Dry, hacking and non-productive Sputum characteristics:  Clear Severity:  Moderate Onset quality:  Gradual Duration:  4 days Timing:  Constant Progression:  Unchanged Chronicity:  New Smoker: no   Context: sick contacts and upper respiratory infection   Relieved by:  Nothing Worsened by:  Nothing Ineffective treatments:  None tried Associated symptoms: chills, ear fullness, headaches and sinus congestion   Associated symptoms: no chest pain and no wheezing   Risk factors: no recent travel     Past Medical History:  Diagnosis Date  . Anxiety   . Chronic headaches    none x 2-3 yrs  . Infection    UTI  . Meningitis   . Ovarian cyst    Past Surgical History:  Procedure Laterality Date  . CHOLECYSTECTOMY     Family History  Problem Relation Age of Onset  . Hypertension Mother   . Breast cancer Maternal Aunt   . Birth defects Daughter     Limb reduction defect   Social History  Substance Use Topics  . Smoking status: Never Smoker  . Smokeless tobacco: Never Used  . Alcohol use No   OB History    Gravida Para Term Preterm AB Living   4 3 3  0 1 3   SAB TAB Ectopic Multiple Live Births   1 0 0 0 3     Review of Systems  Reason unable to perform ROS: as covered in HPI.  Constitutional: Positive for chills.  Respiratory: Positive for cough. Negative for wheezing.   Cardiovascular: Negative for chest pain.  Neurological: Positive for headaches.  All other systems reviewed and are negative.   Allergies  Penicillins  Home Medications   Prior to Admission medications   Medication Sig Start Date End Date Taking? Authorizing  Provider  norethindrone-ethinyl estradiol 1/35 ([place] 1/35, 28,) tablet Take 1 tablet by mouth daily. 02/27/16  Yes Shelly Bombard, MD  benzonatate (TESSALON) 100 MG capsule Take 1 capsule (100 mg total) by mouth every 8 (eight) hours. 06/16/16   Barnet Glasgow, NP   Meds Ordered and Administered this Visit  Medications - No data to display  BP (!) 101/50 (BP Location: Right Arm)   Pulse 81   Temp 98.2 F (36.8 C) (Oral)   Resp 18   LMP 06/10/2016 (Approximate)   SpO2 98%   Breastfeeding? No  No data found.   Physical Exam  Constitutional: She is oriented to person, place, and time. She appears well-developed and well-nourished. She appears ill. No distress.  HENT:  Head: Normocephalic and atraumatic.  Right Ear: Tympanic membrane and external ear normal.  Left Ear: Tympanic membrane and external ear normal.  Nose: Rhinorrhea present. Right sinus exhibits no maxillary sinus tenderness and no frontal sinus tenderness. Left sinus exhibits no maxillary sinus tenderness and no frontal sinus tenderness.  Mouth/Throat: Uvula is midline, oropharynx is clear and moist and mucous membranes are normal. No oropharyngeal exudate. Tonsils are 0 on the right. Tonsils are 0 on the left.  Eyes: Pupils are equal, round, and reactive to light.  Neck: Normal range of motion. Neck supple. No JVD present.  Cardiovascular: Normal rate and regular rhythm.  Pulmonary/Chest: Effort normal and breath sounds normal. No respiratory distress. She has no wheezes.  Abdominal: Soft. Bowel sounds are normal. She exhibits no distension. There is no tenderness. There is no guarding.  Lymphadenopathy:       Head (right side): No submental, no submandibular and no tonsillar adenopathy present.       Head (left side): No submental, no submandibular and no tonsillar adenopathy present.    She has no cervical adenopathy.  Neurological: She is alert and oriented to person, place, and time.  Skin: Skin is warm  and dry. Capillary refill takes less than 2 seconds. She is not diaphoretic.  Psychiatric: She has a normal mood and affect.  Nursing note and vitals reviewed.   Urgent Care Course     Procedures (including critical care time)  Labs Review Labs Reviewed - No data to display  Imaging Review No results found.   Visual Acuity Review  Right Eye Distance:   Left Eye Distance:   Bilateral Distance:    Right Eye Near:   Left Eye Near:    Bilateral Near:         MDM   1. Viral URI with cough   You most likely have a viral URI, I advise rest, plenty of fluids and management of symptoms with over the counter medicines. For symptoms you may take Tylenol as needed every 4-6 hours for body aches or fever, not to exceed 4,000 mg a day, Take mucinex or mucinex DM ever 12 hours with a full glass of water, you may use an inhaled steroid such as Flonase, 2 sprays each nostril once a day for congestion, or an antihistamine such as Claritin or Zyrtec once a day. For cough, I have prescribed a medication called Tessalon. Take 1 tablet every 8 hours as needed for your cough. Should your symptoms worsen or fail to resolve, follow up with your primary care provider or return to clinic.      Barnet Glasgow, NP 06/16/16 1326

## 2016-06-20 ENCOUNTER — Ambulatory Visit (HOSPITAL_COMMUNITY)
Admission: EM | Admit: 2016-06-20 | Discharge: 2016-06-20 | Disposition: A | Discharge status: Home / Self Care | Payer: Medicaid Other | Attending: Emergency Medicine | Admitting: Emergency Medicine

## 2016-06-20 ENCOUNTER — Encounter (HOSPITAL_COMMUNITY): Payer: Self-pay | Admitting: Emergency Medicine

## 2016-06-20 DIAGNOSIS — H6501 Acute serous otitis media, right ear: Secondary | ICD-10-CM | POA: Diagnosis not present

## 2016-06-20 DIAGNOSIS — H60339 Swimmer's ear, unspecified ear: Secondary | ICD-10-CM | POA: Diagnosis not present

## 2016-06-20 MED ORDER — NEOMYCIN-POLYMYXIN-HC 3.5-10000-1 OT SUSP: 4.0000 [drp] | Freq: Three times a day (TID) | OTIC | 0 refills | Status: DC

## 2016-06-20 MED ORDER — SULFAMETHOXAZOLE-TRIMETHOPRIM 800-160 MG PO TABS: 1.0000 | ORAL_TABLET | Freq: Two times a day (BID) | ORAL | 0 refills | Status: AC

## 2016-06-20 NOTE — ED Provider Notes (Signed)
CSN: [id number]     Arrival date & time 06/20/16  1338 History   First MD Initiated Contact with Patient 06/20/16 1510     Chief Complaint  Patient presents with  . Otalgia   (Consider location/radiation/quality/duration/timing/severity/associated sxs/prior Treatment) Patient c/o right ear pain.  She has difficulty hearing out of her right ear.   The history is provided by the patient.  Otalgia  Location:  Right Behind ear:  No abnormality Quality:  Aching Severity:  Moderate Onset quality:  Sudden Duration:  1 day Timing:  Constant Progression:  Worsening Chronicity:  New Relieved by:  None tried Worsened by:  Nothing Associated symptoms: ear discharge     Past Medical History:  Diagnosis Date  . Anxiety   . Chronic headaches    none x 2-3 yrs  . Infection    UTI  . Meningitis   . Ovarian cyst    Past Surgical History:  Procedure Laterality Date  . CHOLECYSTECTOMY     Family History  Problem Relation Age of Onset  . Hypertension Mother   . Breast cancer Maternal Aunt   . Birth defects Daughter     Limb reduction defect   Social History  Substance Use Topics  . Smoking status: Never Smoker  . Smokeless tobacco: Never Used  . Alcohol use No   OB History    Gravida Para Term Preterm AB Living   4 3 3  0 1 3   SAB TAB Ectopic Multiple Live Births   1 0 0 0 3     Review of Systems  Constitutional: Positive for fatigue.  HENT: Positive for ear discharge and ear pain.   Eyes: Negative.   Respiratory: Negative.   Cardiovascular: Negative.   Gastrointestinal: Negative.   Endocrine: Negative.   Genitourinary: Negative.   Musculoskeletal: Negative.   Allergic/Immunologic: Negative.   Neurological: Negative.   Hematological: Negative.   Psychiatric/Behavioral: Negative.     Allergies  Penicillins  Home Medications   Prior to Admission medications   Medication Sig Start Date End Date Taking? Authorizing Provider  norethindrone-ethinyl estradiol  1/35 ([place] 1/35, 28,) tablet Take 1 tablet by mouth daily. 02/27/16  Yes Shelly Bombard, MD  benzonatate (TESSALON) 100 MG capsule Take 1 capsule (100 mg total) by mouth every 8 (eight) hours. 06/16/16   Barnet Glasgow, NP  neomycin-polymyxin-hydrocortisone (CORTISPORIN) 3.5-10000-1 otic suspension Place 4 drops into the right ear 3 (three) times daily. 06/20/16   Lysbeth Penner, FNP  sulfamethoxazole-trimethoprim (BACTRIM DS,SEPTRA DS) 800-160 MG tablet Take 1 tablet by mouth 2 (two) times daily. 06/20/16 06/27/16  Lysbeth Penner, FNP   Meds Ordered and Administered this Visit  Medications - No data to display  BP 108/68 (BP Location: Right Arm)   Pulse 79   Temp 98 F (36.7 C) (Oral)   Resp 16   LMP 06/10/2016 (Approximate)   SpO2 100%  No data found.   Physical Exam  Constitutional: She appears well-developed and well-nourished.  HENT:  Head: Normocephalic and atraumatic.  Left Ear: External ear normal.  Mouth/Throat: Oropharynx is clear and moist.  Right TM erythematous and EAC with tenderness and diminished patency and tender with speculum exam.  Eyes: Conjunctivae and EOM are normal. Pupils are equal, round, and reactive to light.  Neck: Normal range of motion. Neck supple.  Cardiovascular: Normal rate, regular rhythm and normal heart sounds.   Pulmonary/Chest: Effort normal and breath sounds normal.  Nursing note and vitals reviewed.   Urgent Care  Course     Procedures (including critical care time)  Labs Review Labs Reviewed - No data to display  Imaging Review No results found.   Visual Acuity Review  Right Eye Distance:   Left Eye Distance:   Bilateral Distance:    Right Eye Near:   Left Eye Near:    Bilateral Near:         MDM   1. Swimmer's ear, unspecified chronicity, unspecified laterality   2. Right acute serous otitis media, recurrence not specified    Cortisporin otic ear gtt's Bactrim DS one po bid x 7 days #14       Lysbeth Penner, FNP 06/20/16 [phone]

## 2016-06-20 NOTE — ED Triage Notes (Signed)
Here for right ear pain onset last night associated w/HA, and right jaw pain, emesis, nauseas  Denies fevers, chills  Last had ibuprofen around 0600  A&O x4... NAD

## 2017-03-31 ENCOUNTER — Encounter: Payer: Self-pay | Admitting: Obstetrics & Gynecology

## 2017-06-25 ENCOUNTER — Other Ambulatory Visit: Payer: Self-pay | Admitting: Obstetrics

## 2017-06-25 DIAGNOSIS — R52 Pain, unspecified: Secondary | ICD-10-CM

## 2017-07-10 ENCOUNTER — Other Ambulatory Visit: Payer: Self-pay

## 2017-07-10 ENCOUNTER — Encounter (HOSPITAL_COMMUNITY): Payer: Self-pay | Admitting: Emergency Medicine

## 2017-07-10 ENCOUNTER — Ambulatory Visit (HOSPITAL_COMMUNITY)
Admission: EM | Admit: 2017-07-10 | Discharge: 2017-07-10 | Disposition: A | Discharge status: Home / Self Care | Payer: Medicaid Other | Attending: Urgent Care | Admitting: Urgent Care

## 2017-07-10 DIAGNOSIS — R1013 Epigastric pain: Secondary | ICD-10-CM | POA: Diagnosis not present

## 2017-07-10 DIAGNOSIS — Z9049 Acquired absence of other specified parts of digestive tract: Secondary | ICD-10-CM | POA: Diagnosis not present

## 2017-07-10 DIAGNOSIS — R110 Nausea: Secondary | ICD-10-CM

## 2017-07-10 DIAGNOSIS — K5900 Constipation, unspecified: Secondary | ICD-10-CM

## 2017-07-10 DIAGNOSIS — Z3202 Encounter for pregnancy test, result negative: Secondary | ICD-10-CM

## 2017-07-10 LAB — POCT I-STAT, CHEM 8
BUN: 12 mg/dL (ref 6–20)
CREATININE: 0.6 mg/dL (ref 0.44–1.00)
Calcium, Ion: 1.25 mmol/L (ref 1.15–1.40)
Chloride: 104 mmol/L (ref 101–111)
GLUCOSE: 95 mg/dL (ref 65–99)
HCT: 38 % (ref 36.0–46.0)
HEMOGLOBIN: 12.9 g/dL (ref 12.0–15.0)
POTASSIUM: 3.7 mmol/L (ref 3.5–5.1)
Sodium: 141 mmol/L (ref 135–145)
TCO2: 28 mmol/L (ref 22–32)

## 2017-07-10 LAB — POCT H PYLORI SCREEN: H. PYLORI SCREEN, POC: NEGATIVE

## 2017-07-10 LAB — POCT URINALYSIS DIP (DEVICE)
Bilirubin Urine: NEGATIVE
Glucose, UA: NEGATIVE mg/dL
Hgb urine dipstick: NEGATIVE
LEUKOCYTES UA: NEGATIVE
Nitrite: NEGATIVE
PROTEIN: NEGATIVE mg/dL
UROBILINOGEN UA: 0.2 mg/dL (ref 0.0–1.0)
pH: 6 (ref 5.0–8.0)

## 2017-07-10 MED ORDER — OMEPRAZOLE 20 MG PO CPDR: 20.0000 mg | DELAYED_RELEASE_CAPSULE | Freq: Every day | ORAL | 2 refills | Status: DC

## 2017-07-10 MED ORDER — RANITIDINE HCL 150 MG PO TABS: 150.0000 mg | ORAL_TABLET | Freq: Two times a day (BID) | ORAL | 0 refills | Status: DC

## 2017-07-10 NOTE — Discharge Instructions (Signed)
Please use Miralax for moderate to severe constipation. Take this once a day for the next 2-3 days. Please also start docusate (50mg  capsules) stool softener, twice a day for at least 1 week. If stools become loose, cut down to once a day for another week. If stools remain loose, cut back to 1 pill every other day for a third week. You can stop docusate thereafter and resume as needed for constipation.  To help reduce constipation and promote bowel health: 1. Drink at least 64 ounces of water each day 2. Eat plenty of fiber (fruits, vegetables, whole grains, legumes) 3. Be physically active or exercise including walking, jogging, swimming, yoga, etc. 4. For active constipation use a stool softener (docusate) or an osmotic laxative (like Miralax) each day, or as needed.

## 2017-07-10 NOTE — ED Triage Notes (Signed)
Pt states Sunday after lunch she started having upper abdominal pain. It went away this morning, she ate some noodle soup and it started hurting again. Pt states the pain happens when she eats.

## 2017-07-10 NOTE — ED Provider Notes (Signed)
MRN: [id number] DOB: 07/02/1985  Subjective:   Lydia [name] is a 32 y.o. female presenting for 5 day history of stabbing, constant epigastric pain. Started after eating on Sunday, had some improvement today but came back after eating soup. Has had nausea without vomiting. Has never tested positive for H. Pylori. Has intermittent constipation, last bowel movement was yesterday, strained and feels like she did not have a complete bowel movement. Uses Miralax for this. Admits that her belly pain is different from what she normally feels. Has a history of cholecystectomy. Patient does not eat healthily. Denies fever, vomiting, dysuria, hematuria. Denies smoking cigarettes. Denies family history of diabetes.   Lydia [name] is allergic to penicillins.  Lydia [name]  has a past medical history of Anxiety, Chronic headaches, Infection, Meningitis, and Ovarian cyst. Also  has a past surgical history that includes Cholecystectomy. Her family history includes Birth defects in her daughter; Breast cancer in her maternal aunt; Hypertension in her mother.   Objective:   Vitals: BP 109/67   Pulse 72   Temp 98.3 F (36.8 C)   Resp 18   LMP 05/09/2017   SpO2 100%   Physical Exam  Constitutional: She is oriented to person, place, and time. She appears well-developed and well-nourished.  HENT:  Mouth/Throat: Oropharynx is clear and moist.  Eyes: No scleral icterus.  Cardiovascular: Normal rate, regular rhythm and intact distal pulses. Exam reveals no gallop and no friction rub.  No murmur heard. Pulmonary/Chest: No respiratory distress. She has no wheezes. She has no rales.  Abdominal: Soft. Bowel sounds are normal. She exhibits no distension and no mass. There is tenderness (upper abdominal). There is no rebound and no guarding.  Well healed laparoscopic surgical scars.  Musculoskeletal: She exhibits no edema.  Neurological: She is alert and oriented to person, place, and time.  Skin: Skin is warm and dry. No rash  noted. No erythema. No pallor.  Psychiatric: She has a normal mood and affect.   Results for orders placed or performed during the hospital encounter of 07/10/17 (from the past 24 hour(s))  POCT urinalysis dip (device)     Status: Abnormal   Collection Time: 07/10/17  8:11 PM  Result Value Ref Range   Glucose, UA NEGATIVE NEGATIVE mg/dL   Bilirubin Urine NEGATIVE NEGATIVE   Ketones, ur TRACE (A) NEGATIVE mg/dL   Specific Gravity, Urine >=1.030 1.005 - 1.030   Hgb urine dipstick NEGATIVE NEGATIVE   pH 6.0 5.0 - 8.0   Protein, ur NEGATIVE NEGATIVE mg/dL   Urobilinogen, UA 0.2 0.0 - 1.0 mg/dL   Nitrite NEGATIVE NEGATIVE   Leukocytes, UA NEGATIVE NEGATIVE  I-STAT, chem 8     Status: None   Collection Time: 07/10/17  8:13 PM  Result Value Ref Range   Sodium 141 135 - 145 mmol/L   Potassium 3.7 3.5 - 5.1 mmol/L   Chloride 104 101 - 111 mmol/L   BUN 12 6 - 20 mg/dL   Creatinine, Ser 0.60 0.44 - 1.00 mg/dL   Glucose, Bld 95 65 - 99 mg/dL   Calcium, Ion 1.25 1.15 - 1.40 mmol/L   TCO2 28 22 - 32 mmol/L   Hemoglobin 12.9 12.0 - 15.0 g/dL   HCT 38.0 36.0 - 46.0 %  H.pylori screen, POC     Status: None   Collection Time: 07/10/17  8:18 PM  Result Value Ref Range   H. PYLORI SCREEN, POC NEGATIVE NEGATIVE   Assessment and Plan :   Abdominal pain, epigastric  Nausea without vomiting  History of laparoscopic cholecystectomy  Constipation, unspecified constipation type  Point of care labs reassuring, counseled on adequate hydration. Reviewed constipation management. Discussed possibility of adhesions from her surgery as a source of her belly pain. Patient will look to f/u with her surgeon if these symptoms persist. Return-to-clinic precautions discussed, patient verbalized understanding.    Jaynee Eagles, PA-C 07/11/17 1550

## 2017-07-10 NOTE — ED Notes (Signed)
Urine specimen obtained while patient in the lobby. Specimen in lab

## 2017-07-11 LAB — POCT PREGNANCY, URINE: Preg Test, Ur: NEGATIVE

## 2017-07-21 ENCOUNTER — Ambulatory Visit: Payer: Medicaid Other | Admitting: Physician Assistant

## 2017-07-21 ENCOUNTER — Other Ambulatory Visit (INDEPENDENT_AMBULATORY_CARE_PROVIDER_SITE_OTHER): Payer: Medicaid Other

## 2017-07-21 ENCOUNTER — Encounter: Payer: Self-pay | Admitting: Physician Assistant

## 2017-07-21 VITALS — BP 90/68 | HR 76 | Ht 59.0 in | Wt 147.0 lb

## 2017-07-21 DIAGNOSIS — R1013 Epigastric pain: Secondary | ICD-10-CM

## 2017-07-21 DIAGNOSIS — K5909 Other constipation: Secondary | ICD-10-CM

## 2017-07-21 LAB — CBC WITH DIFFERENTIAL/PLATELET
Basophils Absolute: 0 10*3/uL (ref 0.0–0.1)
Basophils Relative: 0.3 % (ref 0.0–3.0)
EOS ABS: 0.2 10*3/uL (ref 0.0–0.7)
Eosinophils Relative: 2.3 % (ref 0.0–5.0)
HEMATOCRIT: 39.5 % (ref 36.0–46.0)
HEMOGLOBIN: 13.4 g/dL (ref 12.0–15.0)
LYMPHS PCT: 35.6 % (ref 12.0–46.0)
Lymphs Abs: 3.3 10*3/uL (ref 0.7–4.0)
MCHC: 34 g/dL (ref 30.0–36.0)
MCV: 86.8 fl (ref 78.0–100.0)
MONOS PCT: 5.3 % (ref 3.0–12.0)
Monocytes Absolute: 0.5 10*3/uL (ref 0.1–1.0)
NEUTROS ABS: 5.2 10*3/uL (ref 1.4–7.7)
Neutrophils Relative %: 56.5 % (ref 43.0–77.0)
PLATELETS: 363 10*3/uL (ref 150.0–400.0)
RBC: 4.55 Mil/uL (ref 3.87–5.11)
RDW: 12.3 % (ref 11.5–15.5)
WBC: 9.2 10*3/uL (ref 4.0–10.5)

## 2017-07-21 LAB — COMPREHENSIVE METABOLIC PANEL
ALBUMIN: 4.4 g/dL (ref 3.5–5.2)
ALK PHOS: 55 U/L (ref 39–117)
ALT: 10 U/L (ref 0–35)
AST: 11 U/L (ref 0–37)
BUN: 12 mg/dL (ref 6–23)
CALCIUM: 10 mg/dL (ref 8.4–10.5)
CO2: 26 mEq/L (ref 19–32)
CREATININE: 0.69 mg/dL (ref 0.40–1.20)
Chloride: 105 mEq/L (ref 96–112)
GFR: 105.42 mL/min (ref >60.00)
Glucose, Bld: 103 mg/dL — ABNORMAL HIGH (ref 70–99)
Potassium: 4.3 mEq/L (ref 3.5–5.1)
Sodium: 140 mEq/L (ref 135–145)
Total Bilirubin: 0.4 mg/dL (ref 0.2–1.2)
Total Protein: 7.4 g/dL (ref 6.0–8.3)

## 2017-07-21 LAB — LIPASE: Lipase: 44 U/L (ref 11.0–59.0)

## 2017-07-21 NOTE — Patient Instructions (Addendum)
Stop Omeprazole  We have sent the following medications to your pharmacy for you to pick up at your convenience: Pantoprazole 40 mg twice a day 3-60 minutes before breakfast and dinner  We have given you a reflux handout.   Hold Zantac for now.   Your physician has requested that you go to the basement for lab work before leaving today.

## 2017-07-21 NOTE — Progress Notes (Addendum)
Chief Complaint: Epigastric pain, constipation  HPI:    Lydia [name] is a 32 year old female with a past medical history as listed below, known to Dr. Hilarie Fredrickson for history of dyspepsia and epigastric pain, who presents to clinic today with a complaint of epigastric pain and constipation.    Office visit 01/25/14 for epigastric pain, bloating and chronic constipation.  Previous H. pylori antibody and celiac studies normal.  Patient tried on a PPI and Linzess.  Returned to clinic for recurrent epigastric pain and bloating.  Bowels moving on every other day Linzess.  Scheduled for EGD at that time.  If this was negative recommendations for CCK HIDA.  Chronic constipation is improved with daily Linzess.    02/02/14-EGD with normal-appearing esophagus and GE junction, stomach well visualized and normal, normal duodenum, H. pylori biopsies negative.     02/11/14-HIDA scan with CCK, overall normal imaging appearance of the hepatobiliary system with normal gallbladder ejection.  No discomfort with CCK.  Gastric emptying study 02/28/14 was normal.    07/10/17 ER visit for epigastric pain.  H. pylori screen negative.  Chem-8 normal.     Today, explains that in 2015 she had a cholecystectomy and her epigastric pain was gone for about a month and then came back but was intermittent.  Over the past month, ever since eating a bowl of soup at lunch, this pain has gotten to the point where it is consistently a 6-7/10 and will increase to a 10/10 made worse by walking or sitting and not necessarily any worse with eating, but some worse when she is emotionally irritated/mad.  Feels like a "pulling/knot".  Laying down helps this pain and also Omeprazole 20 mg and Ranitidine 150 mg twice daily sometimes helps this pain.  Associated symptoms including bloating.    Also chronic constipation relieved with MiraLAX 3 days out of the week.    Denies fever, chills, weight loss, anorexia, nausea, vomiting, change in bowel habits or  symptoms that awaken her.  Past Medical History:  Diagnosis Date  . Anxiety   . Chronic headaches    none x 2-3 yrs  . Infection    UTI  . Meningitis   . Ovarian cyst     Past Surgical History:  Procedure Laterality Date  . CHOLECYSTECTOMY      Current Outpatient Medications  Medication Sig Dispense Refill  . norethindrone-ethinyl estradiol 1/35 ([place] 1/35, 28,) tablet Take 1 tablet by mouth daily. 1 Package 11  . omeprazole (PRILOSEC) 20 MG capsule Take 1 capsule (20 mg total) by mouth daily. 30 capsule 2  . ranitidine (ZANTAC) 150 MG tablet Take 1 tablet (150 mg total) by mouth 2 (two) times daily. 60 tablet 0   No current facility-administered medications for this visit.     Allergies as of 07/21/2017 - Review Complete 07/10/2017  Allergen Reaction Noted  . Penicillins Hives 11/30/2007    Family History  Problem Relation Age of Onset  . Hypertension Mother   . Breast cancer Maternal Aunt   . Birth defects Daughter        Limb reduction defect    Social History   Socioeconomic History  . Marital status: Married    Spouse name: Not on file  . Number of children: 2  . Years of education: Not on file  . Highest education level: Not on file  Social Needs  . Financial resource strain: Not on file  . Food insecurity - worry: Not on file  . Food  insecurity - inability: Not on file  . Transportation needs - medical: Not on file  . Transportation needs - non-medical: Not on file  Occupational History  . Not on file  Tobacco Use  . Smoking status: Never Smoker  . Smokeless tobacco: Never Used  Substance and Sexual Activity  . Alcohol use: No    Alcohol/week: 0.0 oz  . Drug use: No  . Sexual activity: Yes    Partners: Male    Birth control/protection: None, Pill  Other Topics Concern  . Not on file  Social History Narrative  . Not on file    Review of Systems:    Constitutional: No weight loss, fever or chills Skin: No rash  Cardiovascular: No  chest pain Respiratory: +cough Gastrointestinal: See HPI and otherwise negative Genitourinary: No dysuria Neurological: No headache Musculoskeletal: No new muscle or joint pain Hematologic: No bleeding  Psychiatric: +anxiety   Physical Exam:  Vital signs: BP 90/68   Pulse 76   Ht 4\' 11"  (1.499 m)   Wt 147 lb (66.7 kg)   BMI 29.69 kg/m   Constitutional:   Pleasant overweight female appears to be in NAD, Well developed, Well nourished, alert and cooperative Head:  Normocephalic and atraumatic. Eyes:   PEERL, EOMI. No icterus. Conjunctiva pink. Ears:  Normal auditory acuity. Neck:  Supple Throat: Oral cavity and pharynx without inflammation, swelling or lesion.  Respiratory: Respirations even and unlabored. Lungs clear to auscultation bilaterally.   No wheezes, crackles, or rhonchi.  Cardiovascular: Normal S1, S2. No MRG. Regular rate and rhythm. No peripheral edema, cyanosis or pallor.  Gastrointestinal:  Soft, nondistended, mild epigastric ttp. No rebound or guarding. Normal bowel sounds. No appreciable masses or hepatomegaly. Rectal:  Not performed.  Msk:  Symmetrical without gross deformities. Without edema, no deformity or joint abnormality.  Neurologic:  Alert and  oriented x4;  grossly normal neurologically.  Skin:   Dry and intact without significant lesions or rashes. Psychiatric: Demonstrates good judgement and reason without abnormal affect or behaviors.  MOST RECENT LABS AND IMAGING: CBC    Component Value Date/Time   WBC 14.9 (H) 08/04/2015 0603   RBC 3.69 (L) 08/04/2015 0603   HGB 12.9 07/10/2017 2013   HCT 38.0 07/10/2017 2013   PLT 218 08/04/2015 0603   MCV 88.6 08/04/2015 0603   MCH 30.1 08/04/2015 0603   MCHC 33.9 08/04/2015 0603   RDW 14.1 08/04/2015 0603   LYMPHSABS 2.5 01/11/2015 1403   MONOABS 0.5 01/11/2015 1403   EOSABS 0.1 01/11/2015 1403   BASOSABS 0.0 01/11/2015 1403    CMP     Component Value Date/Time   NA 141 07/10/2017 2013   K 3.7  07/10/2017 2013   CL 104 07/10/2017 2013   CO2 27 06/04/2015 1412   GLUCOSE 95 07/10/2017 2013   BUN 12 07/10/2017 2013   CREATININE 0.60 07/10/2017 2013   CALCIUM 9.2 06/04/2015 1412   PROT 7.2 02/16/2015 1330   ALBUMIN 3.7 02/16/2015 1330   AST 17 02/16/2015 1330   ALT 9 (L) 02/16/2015 1330   ALKPHOS 39 02/16/2015 1330   BILITOT 0.7 02/16/2015 1330   GFRNONAA >60 06/04/2015 1412   GFRAA >60 06/04/2015 1412    Assessment: 1.  Epigastric pain: Worse over the past month since eating a bowl of soup, previous workup in 2015 with EGD, celiac studies, various labs, HIDA scan CCK and gastric emptying study all normal, eventually had cholecystectomy and pain was gone for a month, then came back  intermittently, notes pain some better with Omeprazole and change in position, worse when she is mad; consider gastritis/bile reflux versus functional dyspepsia 2.  Chronic constipation: Better with MiraLAX 3 days a week  Plan: 1.  Stop Omeprazole 20 mg daily. 2.  Prescribed Pantoprazole 40 mg twice daily, 30-60 minutes before breakfast and dinner.  Discussed with patient that if there is a gastritis element this medication should help. #60 refill x2 3.  If no change with above after a month will schedule for an EGD for further evaluation. 4.  Reviewed antireflux diet and lifestyle modifications.  Provided patient with a handout. 5.  Patient can hold her Zantac for now. 6.  Patient to follow in clinic with me in 3-4 weeks.  Ellouise Newer, PA-C [facility] 07/21/2017, 9:01 AM  Addendum: Reviewed and agree with initial management. Pyrtle, Lajuan Lines, MD

## 2017-07-22 ENCOUNTER — Telehealth: Payer: Self-pay | Admitting: Physician Assistant

## 2017-07-22 IMAGING — US US OB COMP LESS 14 WK
1 series · 14 of 28 positions shown · non-contrast
Comparison: None.

CLINICAL DATA: Right lower quadrant pain, onset yesterday. No
bleeding.

EXAM:
OBSTETRIC <14 WK US AND TRANSVAGINAL OB US
TECHNIQUE: Both transabdominal and transvaginal ultrasound examinations were
performed for complete evaluation of the gestation as well as the
maternal uterus, adnexal regions, and pelvic cul-de-sac.
Transvaginal technique was performed to assess early pregnancy.

[Series 1: us ob comp less 14 wk · 14 of 40 slices shown]
[im 2/40]
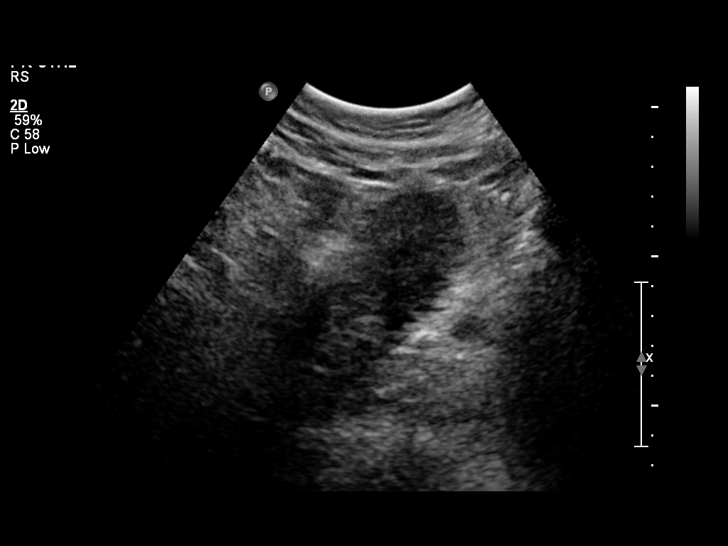
[im 5/40]
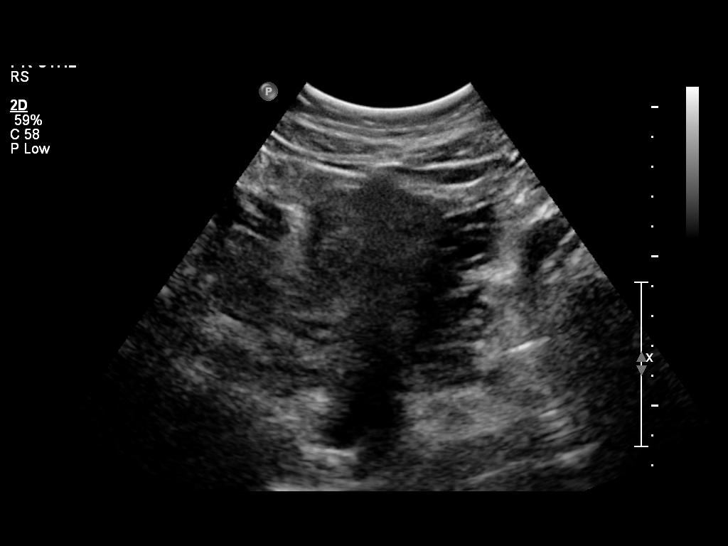
[im 8/40]
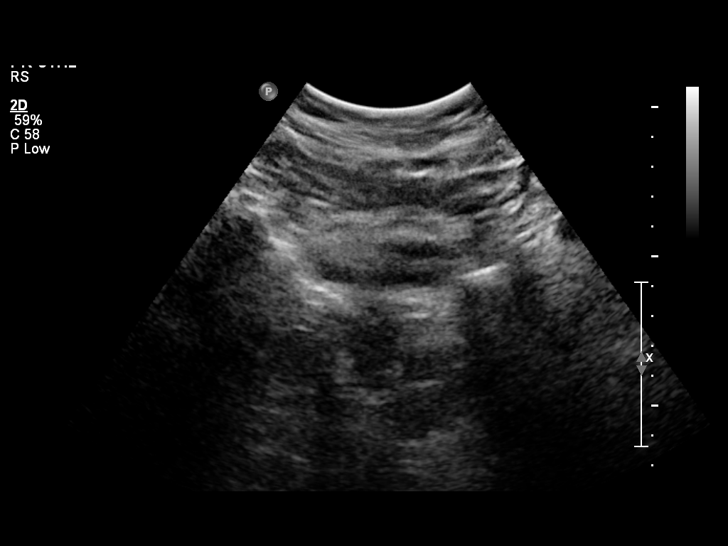
[im 11/40]
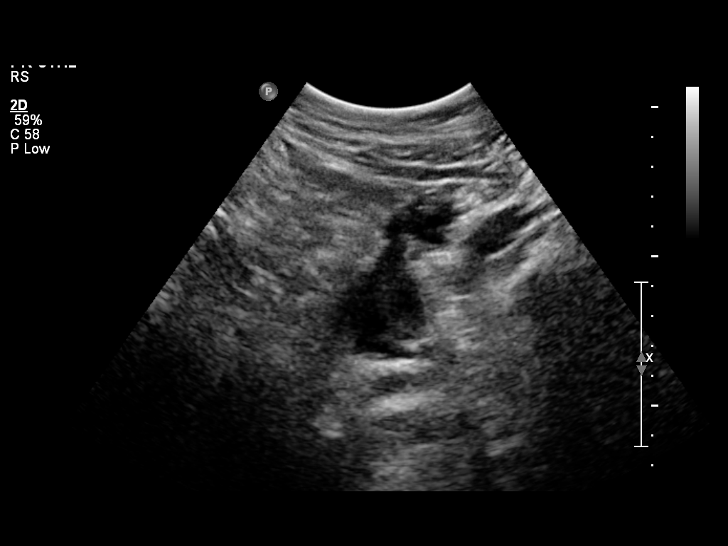
[im 14/40]
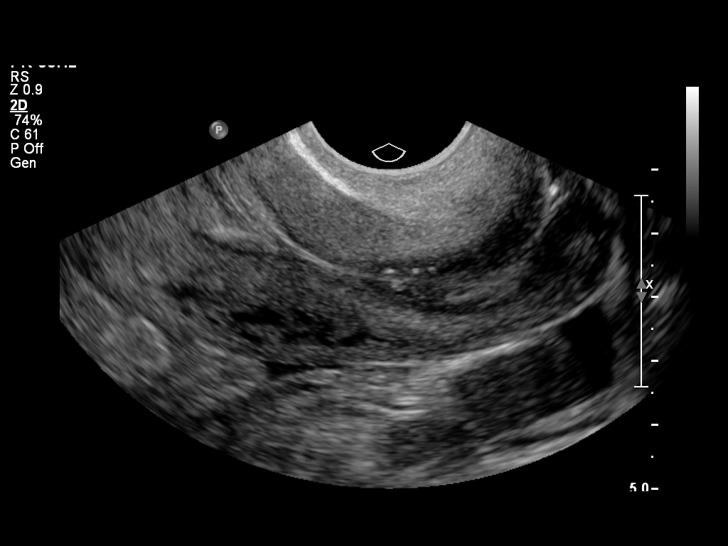
[im 16/40]
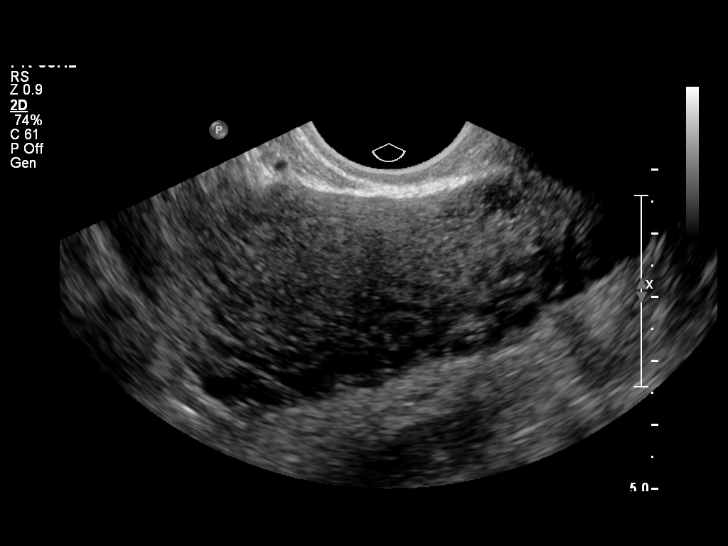
[im 19/40]
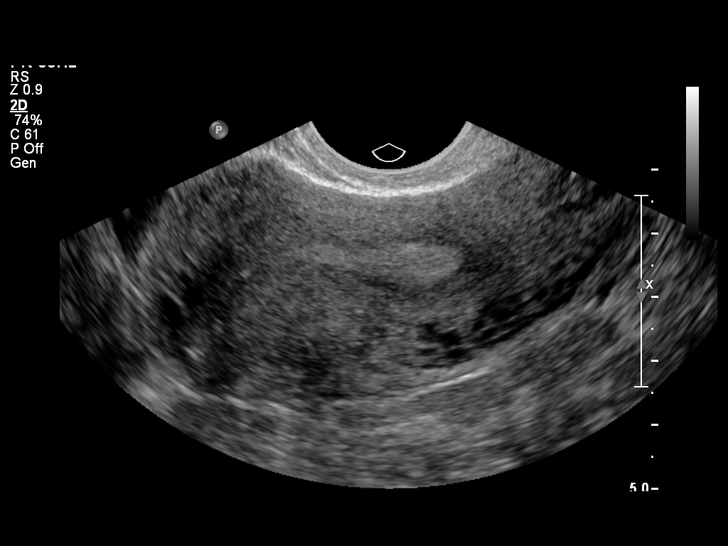
[im 22/40]
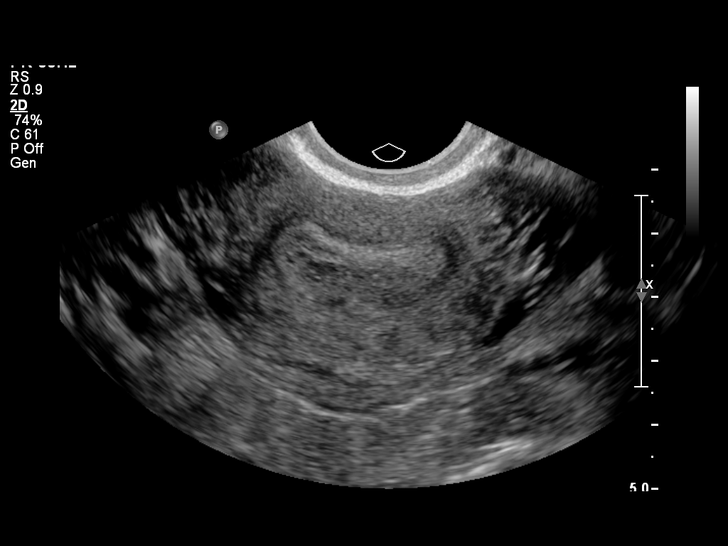
[im 25/40]
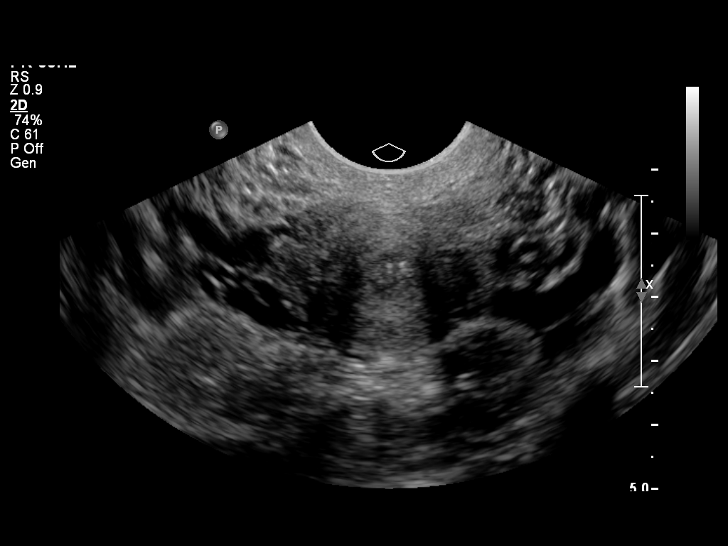
[im 28/40]
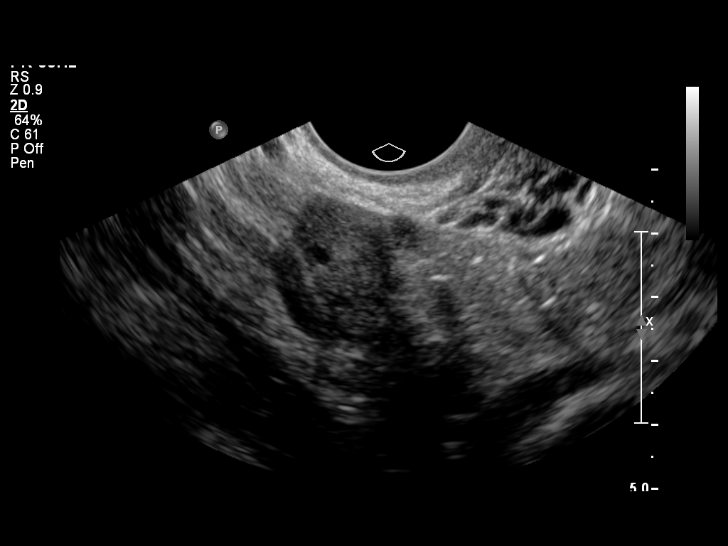
[im 31/40]
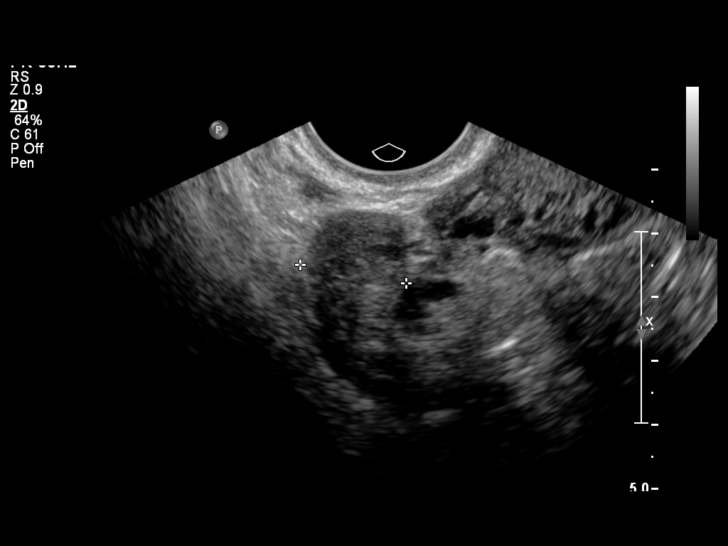
[im 34/40]
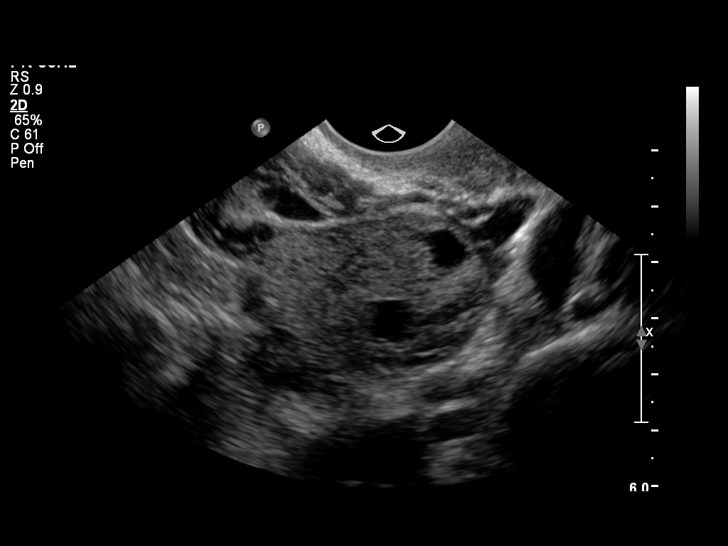
[im 37/40]
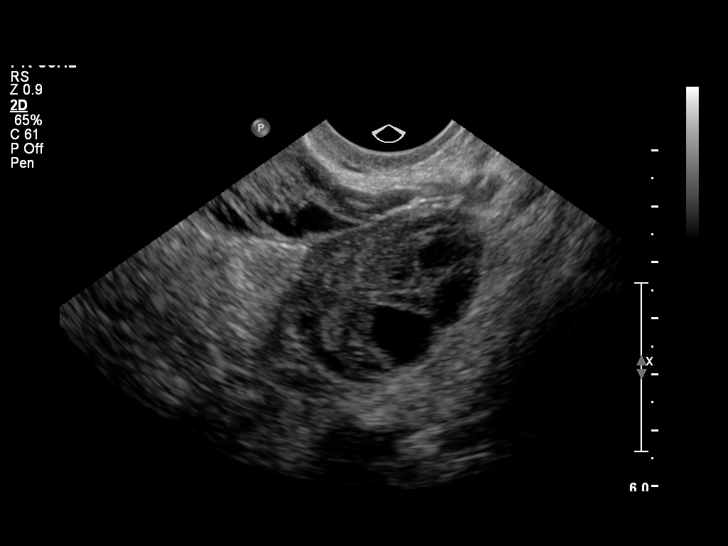
[im 40/40]
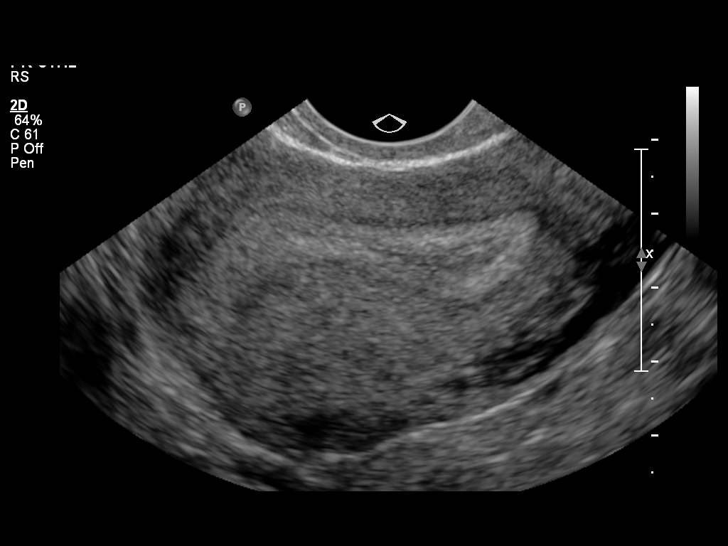

[14 of 28 positions shown; findings below may reference images not displayed]

FINDINGS: Intrauterine gestational sac: No

Yolk sac:  No

Embryo:  No

Cardiac Activity: No

Maternal uterus/adnexae: Normal
IMPRESSION: No visible gestational sac. Normal maternal adnexal regions. No
abnormal fluid collections. Differential considerations include an
intrauterine gestation which is too small to visualize, spontaneous
complete abortion, ectopic gestation. Correlation with quantitative
HCG and careful follow-up necessary.

## 2017-07-23 MED ORDER — PANTOPRAZOLE SODIUM 40 MG PO TBEC: 40.0000 mg | DELAYED_RELEASE_TABLET | Freq: Two times a day (BID) | ORAL | 2 refills | Status: DC

## 2017-07-23 NOTE — Telephone Encounter (Signed)
Rx sent to pharmacy   

## 2017-08-26 ENCOUNTER — Other Ambulatory Visit: Payer: Self-pay | Admitting: Obstetrics

## 2017-08-26 DIAGNOSIS — Z30011 Encounter for initial prescription of contraceptive pills: Secondary | ICD-10-CM

## 2017-08-26 NOTE — Telephone Encounter (Signed)
Please review for refill. Annual needed

## 2017-12-05 ENCOUNTER — Other Ambulatory Visit: Payer: Self-pay | Admitting: Physician Assistant

## 2018-05-11 ENCOUNTER — Encounter (HOSPITAL_COMMUNITY): Payer: Self-pay

## 2018-05-11 ENCOUNTER — Emergency Department (HOSPITAL_COMMUNITY)
Admission: EM | Admit: 2018-05-11 | Discharge: 2018-05-11 | Disposition: A | Discharge status: Home / Self Care | Payer: Medicaid Other | Attending: Emergency Medicine | Admitting: Emergency Medicine

## 2018-05-11 ENCOUNTER — Other Ambulatory Visit: Payer: Self-pay

## 2018-05-11 DIAGNOSIS — M545 Low back pain, unspecified: Secondary | ICD-10-CM

## 2018-05-11 DIAGNOSIS — Z79899 Other long term (current) drug therapy: Secondary | ICD-10-CM | POA: Diagnosis not present

## 2018-05-11 MED ORDER — DICLOFENAC SODIUM 75 MG PO TBEC: 75.0000 mg | DELAYED_RELEASE_TABLET | Freq: Two times a day (BID) | ORAL | 0 refills | Status: DC

## 2018-05-11 MED ORDER — METHOCARBAMOL 500 MG PO TABS: 500.0000 mg | ORAL_TABLET | Freq: Two times a day (BID) | ORAL | 0 refills | Status: DC

## 2018-05-11 NOTE — ED Provider Notes (Signed)
[facility] Provider Note   CSN: [id number] Arrival date & time: 05/11/18  1629     History   Chief Complaint Chief Complaint  Patient presents with  . Back Pain    HPI Lydia [name] is a 32 y.o. female.  The history is provided by the patient. No language interpreter was used.  Back Pain   This is a new problem. The current episode started more than 2 days ago. The problem occurs constantly. The problem has been gradually worsening. The pain is present in the lumbar spine. The quality of the pain is described as aching. The pain does not radiate. The pain is moderate. Pertinent negatives include no chest pain, no abdominal pain, no dysuria, no paresthesias and no paresis.    Past Medical History:  Diagnosis Date  . Anxiety   . Chronic headaches    none x 2-3 yrs  . Infection    UTI  . Meningitis   . Ovarian cyst     Patient Active Problem List   Diagnosis Date Noted  . Acute mastitis 08/08/2015  . NSVD (normal spontaneous vaginal delivery) 08/03/2015  . Indication for care in labor and delivery, antepartum 08/02/2015  . Family history of congenital anomalies   . Dyspareunia, female 03/11/2014  . Acute recurrent maxillary sinusitis 03/11/2014  . Abdominal pain, epigastric 01/26/2014  . Tinea versicolor 12/08/2013  . Pain 12/08/2013  . Unspecified inflammatory disease of female pelvic organs and tissues 12/08/2013  . Pelvic pain in female 07/06/2013  . Dysmenorrhea 10/21/2012  . Unspecified symptom associated with female genital organs 10/07/2012  . Follicular cyst of ovary 10/07/2012  . Other and unspecified ovarian cyst 10/07/2012  . 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 HEADACHE 11/30/2007    Past Surgical History:  Procedure Laterality Date  . CHOLECYSTECTOMY       OB History    Gravida  4   Para  3   Term  3   Preterm  0   AB  1   Living  3     SAB  1   TAB  0   Ectopic  0   Multiple  0   Live Births    3            Home Medications    Prior to Admission medications   Medication Sig Start Date End Date Taking? Authorizing Provider  [place] 1/35, 28, tablet TAKE 1 TABLET BY MOUTH DAILY 08/26/17   Shelly Bombard, MD  pantoprazole (PROTONIX) 40 MG tablet TAKE 1 TABLET(40 MG) BY MOUTH TWICE DAILY BEFORE A MEAL 12/05/17   Levin Erp, PA  ranitidine (ZANTAC) 150 MG tablet Take 1 tablet (150 mg total) by mouth 2 (two) times daily. 07/10/17   Jaynee Eagles, PA-C    Family History Family History  Problem Relation Age of Onset  . Hypertension Mother   . Breast cancer Maternal Aunt   . Birth defects Daughter        Limb reduction defect    Social History Social History   Tobacco Use  . Smoking status: Never Smoker  . Smokeless tobacco: Never Used  Substance Use Topics  . Alcohol use: No    Alcohol/week: 0.0 standard drinks  . Drug use: No     Allergies   Penicillins   Review of Systems Review of Systems  Cardiovascular: Negative for chest pain.  Gastrointestinal: Negative for abdominal pain.  Genitourinary: Negative for dysuria.  Musculoskeletal:  Positive for back pain.  Neurological: Negative for paresthesias.  All other systems reviewed and are negative.    Physical Exam Updated Vital Signs BP 106/74 (BP Location: Right Arm)   Pulse 87   Temp 98.5 F (36.9 C) (Oral)   Resp 18   SpO2 100%   Physical Exam Vitals signs and nursing note reviewed.  Constitutional:      Appearance: She is well-developed.  HENT:     Head: Normocephalic.     Nose: Nose normal.     Mouth/Throat:     Mouth: Mucous membranes are moist.  Eyes:     Pupils: Pupils are equal, round, and reactive to light.  Neck:     Musculoskeletal: Normal range of motion.  Cardiovascular:     Rate and Rhythm: Normal rate.  Pulmonary:     Effort: Pulmonary effort is normal.  Abdominal:     General: Abdomen is flat. There is no distension.  Musculoskeletal: Normal range of motion.   Skin:    General: Skin is warm.  Neurological:     Mental Status: She is alert and oriented to person, place, and time.  Psychiatric:        Mood and Affect: Mood normal.      ED Treatments / Results  Labs (all labs ordered are listed, but only abnormal results are displayed) Labs Reviewed - No data to display  EKG None  Radiology No results found.  Procedures Procedures (including critical care time)  Medications Ordered in ED Medications - No data to display   Initial Impression / Assessment and Plan / ED Course  I have reviewed the triage vital signs and the nursing notes.  Pertinent labs & imaging results that were available during my care of the patient were reviewed by me and considered in my medical decision making (see chart for details).     MDM  Pt's pain seems muscular.  I will try voltaren and robaxin.  Pt advised to follow up with Orthopaedist if pain persist  Final Clinical Impressions(s) / ED Diagnoses   Final diagnoses:  Acute bilateral low back pain without sciatica    ED Discharge Orders         Ordered    diclofenac (VOLTAREN) 75 MG EC tablet  2 times daily     05/11/18 2103    methocarbamol (ROBAXIN) 500 MG tablet  2 times daily     05/11/18 2103        An After Visit Summary was printed and given to the patient.    Fransico Meadow, PA-C 05/11/18 2103    Deno Etienne, DO 05/11/18 [phone]

## 2018-05-11 NOTE — ED Triage Notes (Signed)
Pt having glow back pain since Saturday and taking tylenol for the pain. Tylenol not helping.  No injury or trauma to the back.

## 2018-05-11 NOTE — Discharge Instructions (Signed)
Return if any problems.

## 2018-05-11 NOTE — ED Notes (Signed)
Patient verbalizes understanding of discharge instructions. Opportunity for questioning and answers were provided. Armband removed by staff, pt discharged from ED.  

## 2018-07-20 ENCOUNTER — Other Ambulatory Visit: Payer: Self-pay

## 2018-07-20 ENCOUNTER — Encounter (HOSPITAL_COMMUNITY): Payer: Self-pay | Admitting: Emergency Medicine

## 2018-07-20 ENCOUNTER — Ambulatory Visit (HOSPITAL_COMMUNITY)
Admission: EM | Admit: 2018-07-20 | Discharge: 2018-07-20 | Disposition: A | Discharge status: Home / Self Care | Payer: Medicaid Other | Attending: Internal Medicine | Admitting: Internal Medicine

## 2018-07-20 DIAGNOSIS — J029 Acute pharyngitis, unspecified: Secondary | ICD-10-CM

## 2018-07-20 NOTE — ED Provider Notes (Signed)
[facility]    CSN: [id number] Arrival date & time: 07/20/18  1914     History   Chief Complaint No chief complaint on file.   HPI Lydia [name] is a 33 y.o. female with a history of anxiety comes to the urgent care with complaints of sore throat and some difficulty swallowing of a few days duration.  Patient denies any shortness of breath, cough or sputum production.  No runny nose or sneezing.  No sick contacts.  No fever or chills. No diarrhea.   HPI  Past Medical History:  Diagnosis Date  . Anxiety   . Chronic headaches    none x 2-3 yrs  . Infection    UTI  . Meningitis   . Ovarian cyst     Patient Active Problem List   Diagnosis Date Noted  . Acute mastitis 08/08/2015  . NSVD (normal spontaneous vaginal delivery) 08/03/2015  . Indication for care in labor and delivery, antepartum 08/02/2015  . Family history of congenital anomalies   . Dyspareunia, female 03/11/2014  . Acute recurrent maxillary sinusitis 03/11/2014  . Abdominal pain, epigastric 01/26/2014  . Tinea versicolor 12/08/2013  . Pain 12/08/2013  . Unspecified inflammatory disease of female pelvic organs and tissues 12/08/2013  . Pelvic pain in female 07/06/2013  . Dysmenorrhea 10/21/2012  . Unspecified symptom associated with female genital organs 10/07/2012  . Follicular cyst of ovary 10/07/2012  . Other and unspecified ovarian cyst 10/07/2012  . 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 HEADACHE 11/30/2007    Past Surgical History:  Procedure Laterality Date  . CHOLECYSTECTOMY      OB History    Gravida  4   Para  3   Term  3   Preterm  0   AB  1   Living  3     SAB  1   TAB  0   Ectopic  0   Multiple  0   Live Births  3            Home Medications    Prior to Admission medications   Medication Sig Start Date End Date Taking? Authorizing Provider  diclofenac (VOLTAREN) 75 MG EC tablet Take 1 tablet (75 mg total) by mouth 2 (two) times daily. 05/11/18   Fransico Meadow, PA-C  methocarbamol (ROBAXIN) 500 MG tablet Take 1 tablet (500 mg total) by mouth 2 (two) times daily. 05/11/18   Fransico Meadow, PA-C  [place] 1/35, 28, tablet TAKE 1 TABLET BY MOUTH DAILY 08/26/17   Shelly Bombard, MD  pantoprazole (PROTONIX) 40 MG tablet TAKE 1 TABLET(40 MG) BY MOUTH TWICE DAILY BEFORE A MEAL 12/05/17   Levin Erp, PA  ranitidine (ZANTAC) 150 MG tablet Take 1 tablet (150 mg total) by mouth 2 (two) times daily. 07/10/17   Jaynee Eagles, PA-C    Family History Family History  Problem Relation Age of Onset  . Hypertension Mother   . Breast cancer Maternal Aunt   . Birth defects Daughter        Limb reduction defect    Social History Social History   Tobacco Use  . Smoking status: Never Smoker  . Smokeless tobacco: Never Used  Substance Use Topics  . Alcohol use: No    Alcohol/week: 0.0 standard drinks  . Drug use: No     Allergies   Penicillins   Review of Systems Review of Systems  Constitutional: Negative for activity change and appetite change.  HENT:  Negative for congestion, ear discharge, ear pain, postnasal drip and voice change.   Eyes: Negative for discharge and itching.  Respiratory: Negative for cough, chest tightness and shortness of breath.   Gastrointestinal: Negative for abdominal distention and anal bleeding.  Neurological: Negative for dizziness, weakness and headaches.     Physical Exam Triage Vital Signs ED Triage Vitals  Enc Vitals Group     BP      Pulse      Resp      Temp      Temp src      SpO2      Weight      Height      Head Circumference      Peak Flow      Pain Score      Pain Loc      Pain Edu?      Excl. in [place]?    No data found.  Updated Vital Signs There were no vitals taken for this visit.  Visual Acuity Right Eye Distance:   Left Eye Distance:   Bilateral Distance:    Right Eye Near:   Left Eye Near:    Bilateral Near:     Physical Exam Constitutional:      Appearance: She is  not ill-appearing or toxic-appearing.  HENT:     Right Ear: Tympanic membrane normal.     Left Ear: Tympanic membrane normal.     Mouth/Throat:     Tonsils: No tonsillar exudate or tonsillar abscesses. Swelling: 0 on the right. 0 on the left.  Neck:     Musculoskeletal: Normal range of motion.     Thyroid: No thyromegaly.  Cardiovascular:     Rate and Rhythm: Normal rate and regular rhythm.  Pulmonary:     Effort: Pulmonary effort is normal.     Breath sounds: Normal breath sounds.  Neurological:     Mental Status: She is alert.      UC Treatments / Results  Labs (all labs ordered are listed, but only abnormal results are displayed) Labs Reviewed - No data to display  EKG None  Radiology No results found.  Procedures Procedures (including critical care time)  Medications Ordered in UC Medications - No data to display  Initial Impression / Assessment and Plan / UC Course  I have reviewed the triage vital signs and the nursing notes.  Pertinent labs & imaging results that were available during my care of the patient were reviewed by me and considered in my medical decision making (see chart for details).     1.  Pharyngitis likely viral: Cepacol lozenges Symptoms will most likely resolve.  Patient is counseled that if she does not improve in the next few days she needs to be reevaluated. Final Clinical Impressions(s) / UC Diagnoses   Final diagnoses:  None   Discharge Instructions   None    ED Prescriptions    None     Controlled Substance Prescriptions [place] Controlled Substance Registry consulted? No   Chase Picket, MD 07/20/18 2007

## 2018-07-20 NOTE — ED Triage Notes (Signed)
Sore throat and headache that started 2 weeks ago.  Reports tongue is painful and very sensitive.

## 2018-08-12 ENCOUNTER — Ambulatory Visit: Payer: Medicaid Other | Admitting: Physician Assistant

## 2018-08-13 ENCOUNTER — Encounter: Payer: Self-pay | Admitting: Internal Medicine

## 2018-08-13 ENCOUNTER — Ambulatory Visit (INDEPENDENT_AMBULATORY_CARE_PROVIDER_SITE_OTHER): Payer: Medicaid Other | Admitting: Internal Medicine

## 2018-08-13 ENCOUNTER — Other Ambulatory Visit: Payer: Self-pay

## 2018-08-13 DIAGNOSIS — K5909 Other constipation: Secondary | ICD-10-CM | POA: Diagnosis not present

## 2018-08-13 DIAGNOSIS — R140 Abdominal distension (gaseous): Secondary | ICD-10-CM

## 2018-08-13 DIAGNOSIS — K219 Gastro-esophageal reflux disease without esophagitis: Secondary | ICD-10-CM | POA: Diagnosis not present

## 2018-08-13 DIAGNOSIS — R1013 Epigastric pain: Secondary | ICD-10-CM | POA: Diagnosis not present

## 2018-08-13 MED ORDER — PANTOPRAZOLE SODIUM 40 MG PO TBEC: DELAYED_RELEASE_TABLET | ORAL | 1 refills | Status: DC

## 2018-08-13 MED ORDER — LINACLOTIDE 72 MCG PO CAPS: 72.0000 ug | ORAL_CAPSULE | Freq: Every day | ORAL | 1 refills | Status: DC

## 2018-08-13 NOTE — Patient Instructions (Signed)
We have sent the following medications to your pharmacy for you to pick up at your convenience: Linzess 72 mcg daily Pantoprazole 40 mg daily  Please call our office or send a mychart message in the next 5-10 days to let us know how you are doing on the Linzess as the dose may need to be titrated depending on your response.  Please follow up with Dr Hilarie Fredrickson in the office in 4 months.

## 2018-08-13 NOTE — Progress Notes (Signed)
Subjective:    Patient ID: Lydia [name], female    DOB: Mar 16, 1986, 32 y.o.   MRN: [id number]  This service was provided via telemedicine.   The patient was located at home The provider was located in GI office The patient did consent to this telephone visit and is aware of possible charges through their insurance for this visit.   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  She was last seen in the office on 07/21/2017 by Ellouise Newer, PA-C.  At the time of her last visit she was having epigastric abdominal pain.  It was noted that she had had a previous evaluation for similar pain in 2015 with EGD, celiac studies, HIDA scan and gastric emptying study.  It was presumed that she had dyspepsia versus gastritis/bile reflux.  Chronic constipation was also discussed.  At that visit her omeprazole was changed to pantoprazole 40 mg daily.  She was to continue MiraLAX as needed for her constipation.  Today she reports that her biggest issue has been constipation.  She is having constipation on a regular basis only having 1 or 2 bowel movements per week.  This is associated with hard stools which can require straining to pass.  This leads to an uncomfortable abdominal sensation in the lower abdomen and also bloating.  She is tried MiraLAX which did not help, Metamucil which did not help and Ex-Lax.  Ex-Lax helps occasionally but can cause crampy discomfort.  No rectal bleeding or melena.  Pantoprazole she continues to use 40 mg daily.  This definitively helps with her upper abdominal discomfort.  The  pain had gone away but then seems to come and go.  Worse with spicy foods.  Can be associated with nausea but not vomiting.  Some heartburn despite the pantoprazole.  No dysphagia or odynophagia.   Review of Systems As per HPI, otherwise negative  Current Medications, Allergies, Past Medical History, Past Surgical History, Family History and Social History were reviewed in [employer] record.     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  1.  Chronic constipation --no response to fiber, MiraLAX and less than ideal response to bisacodyl.  Trial of Linzess 72 mcg daily, 30 minutes before breakfast. I explained that we may have to dose titrate depending on response.  She should expect 4-7 bowel movements per week without diarrhea.  Diarrhea would be an unintended side effect.  I asked that she check in in 5 to 10 days to let me know how she is responding to this dose and then we can decide whether to increase dose.  2.  GERD/dyspepsia --improved with pantoprazole 40 mg though some breakthrough symptom.  GERD diet again recommended.  We will see if improving constipation actually helps with her  dyspeptic symptoms.  She will continue pantoprazole 40 mg daily.  Refills today.  Office follow-up in about 3 months, sooner if needed.  15 minutes spent with the patient today. Greater than 50% was spent in counseling and coordination of care with the patient

## 2018-08-13 NOTE — Addendum Note (Signed)
Addended by: Larina Bras on: 08/13/2018 03:34 PM   Modules accepted: Orders

## 2018-08-27 NOTE — Telephone Encounter (Signed)
Thank pt for email Would increase Linzess to 145 mcg daily to see if this works better and increases freq of BMs Virtual visit in a few weeks in not already scheduled

## 2018-10-05 ENCOUNTER — Other Ambulatory Visit: Payer: Self-pay | Admitting: Internal Medicine

## 2018-11-03 ENCOUNTER — Other Ambulatory Visit: Payer: Self-pay | Admitting: Internal Medicine

## 2018-11-04 ENCOUNTER — Other Ambulatory Visit: Payer: Self-pay

## 2019-01-05 ENCOUNTER — Other Ambulatory Visit: Payer: Medicaid Other

## 2019-01-05 ENCOUNTER — Encounter: Payer: Self-pay | Admitting: Internal Medicine

## 2019-01-05 ENCOUNTER — Ambulatory Visit: Payer: Medicaid Other | Admitting: Internal Medicine

## 2019-01-05 ENCOUNTER — Other Ambulatory Visit: Payer: Self-pay

## 2019-01-05 VITALS — BP 104/62 | HR 75 | Temp 97.9°F | Ht <= 58 in | Wt 154.0 lb

## 2019-01-05 DIAGNOSIS — K5909 Other constipation: Secondary | ICD-10-CM

## 2019-01-05 DIAGNOSIS — R1013 Epigastric pain: Secondary | ICD-10-CM | POA: Diagnosis not present

## 2019-01-05 MED ORDER — HYOSCYAMINE SULFATE SL 0.125 MG SL SUBL: 1.0000 | SUBLINGUAL_TABLET | Freq: Four times a day (QID) | SUBLINGUAL | 3 refills | Status: DC | PRN

## 2019-01-05 MED ORDER — LINACLOTIDE 145 MCG PO CAPS: 145.0000 ug | ORAL_CAPSULE | Freq: Every day | ORAL | 3 refills | Status: DC

## 2019-01-05 NOTE — Patient Instructions (Signed)
Continue Linzess 145 mcg daily  Discontinue Pantoprazole for 5 days, then complete your h pylori antigen. After you have completed this, you may restart pantoprazole twice daily.  We have sent the following medications to your pharmacy for you to pick up at your convenience: Levsin 0.125 SL- 1-2 tablets every 6 hours as needed for crampy abdominal pain.  If you are age 32 or older, your body mass index should be between 23-30. Your Body mass index is 34.53 kg/m. If this is out of the aforementioned range listed, please consider follow up with your Primary Care Provider.  If you are age 66 or younger, your body mass index should be between 19-25. Your Body mass index is 34.53 kg/m. If this is out of the aformentioned range listed, please consider follow up with your Primary Care Provider.

## 2019-01-05 NOTE — Progress Notes (Signed)
   Subjective:    Patient ID: Lydia [name], female    DOB: 1985-08-07, 33 y.o.   MRN: [id number]  HPI Lydia [name] is a 33 year old female with a past medical history of epigastric pain, dyspepsia and chronic constipation who is seen for follow-up.  She was seen by virtual visit on 08/13/2018 and presents in person today.  She reports that her constipation has improved since taking Linzess 145 mcg daily.  We initially started 72 mcg but she had to dose titrate up.  She is using this 2 days on and 1 day off and with this having regular formed bowel movements.  If she takes it absolutely every day she will have loose stools or diarrhea.  No blood in her stool or melena.  She is having intermittent epigastric pain and bloating.  Worse with spicy foods.  She has been doing pantoprazole 40 mg twice daily and having the symptoms despite this.  Happens initially about once a month which she said she could tolerate but now happening 2 times per week.  Feels almost like there is a catch with moving.  Can be worse if she lies on her left side.  Again associated with bloating.  Does not feel like heartburn.  If she does not take pantoprazole she does develop more traditional heartburn and burning type dyspeptic pain.   Review of Systems As per HPI, otherwise negative  Current Medications, Allergies, Past Medical History, Past Surgical History, Family History and Social History were reviewed in [employer] record.     Objective:   Physical Exam BP 104/62 (BP Location: Left Arm, Patient Position: Sitting, Cuff Size: Normal)   Pulse 75   Temp 97.9 F (36.6 C) (Other (Comment))   Ht 4\' 8"  (1.422 m)   Wt 154 lb (69.9 kg)   BMI 34.53 kg/m  Gen: awake, alert, NAD HEENT: anicteric, op clear CV: RRR, no mrg Pulm: CTA b/l Abd: soft, NT/ND, +BS throughout Ext: no c/c/e Neuro: nonfocal     Assessment & Plan:  33 year old female with a past medical history of epigastric pain,  dyspepsia and chronic constipation who is seen for follow-up.   1.  Chronic constipation --we have dose titrated and settled on Linzess 145 mcg on 2 days, off 1 day, etc.  This is working well for her.  Continue this regimen.  2.  Epigastric pain/bloating --persistent though intermittent couple times a week.  Present despite twice daily PPI.  She is status post cholecystectomy.  Pain reminds her of prior gallbladder type pain.  I recommended that we discontinue PPI for 5 days and send an H. pylori stool antigen.  Also try Levsin 0.125 mg, 1 to 2 tablets every 6-8 hours as needed for upper abdominal crampy discomfort.  If no better could consider cross-sectional imaging.  Can resume PPI with pantoprazole 40 mg twice daily AC after submitting H. pylori stool antigen  15 minutes spent with the patient today. Greater than 50% was spent in counseling and coordination of care with the patient

## 2019-01-07 ENCOUNTER — Other Ambulatory Visit: Payer: Medicaid Other

## 2019-01-07 DIAGNOSIS — K5909 Other constipation: Secondary | ICD-10-CM

## 2019-01-07 DIAGNOSIS — R1013 Epigastric pain: Secondary | ICD-10-CM

## 2019-01-08 LAB — HELICOBACTER PYLORI  SPECIAL ANTIGEN
MICRO NUMBER:: 819027
SPECIMEN QUALITY: ADEQUATE

## 2019-01-27 NOTE — Telephone Encounter (Signed)
Please check in with the patient who had sent an email during my medical leave  If she is still having symptoms on twice daily PPI then I would recommend we proceed with upper endoscopy This can be scheduled if she still having epigastric abdominal pain and bloating symptom

## 2019-02-02 ENCOUNTER — Encounter: Payer: Self-pay | Admitting: Internal Medicine

## 2019-02-26 ENCOUNTER — Ambulatory Visit (AMBULATORY_SURGERY_CENTER): Payer: Medicaid Other | Admitting: *Deleted

## 2019-02-26 ENCOUNTER — Other Ambulatory Visit: Payer: Self-pay

## 2019-02-26 VITALS — Ht <= 58 in | Wt 150.0 lb

## 2019-02-26 DIAGNOSIS — R1013 Epigastric pain: Secondary | ICD-10-CM

## 2019-02-26 DIAGNOSIS — Z1159 Encounter for screening for other viral diseases: Secondary | ICD-10-CM

## 2019-02-26 NOTE — Progress Notes (Signed)
Patient's pre-visit was done today over the phone with the patient due to COVID-19 pandemic. Name,DOB and address verified. Insurance verified. Packet of Prep instructions mailed to patient including copy of a consent form and pre-procedure patient acknowledgement form-pt is aware. Patient understands to call us back with any questions or concerns.   Pt is aware that care partner will wait in the car during procedure; if they feel like they will be too hot or cold to wait in the car; they may wait in the 4 th floor lobby. Patient is aware to bring only one care partner. We want them to wear a mask (we do not have any that we can provide them), practice social distancing, and we will check their temperatures when they get here.  I did remind the patient that their care partner needs to stay in the parking lot the entire time and have a cell phone available, we will call them when the pt is ready for discharge. Patient will wear mask into building.  Covid screening test on 03/09/2019-pt is aware!

## 2019-03-09 ENCOUNTER — Other Ambulatory Visit: Payer: Self-pay

## 2019-03-09 DIAGNOSIS — Z1159 Encounter for screening for other viral diseases: Secondary | ICD-10-CM

## 2019-03-10 LAB — SARS CORONAVIRUS 2 (TAT 6-24 HRS): SARS Coronavirus 2: NEGATIVE

## 2019-03-11 ENCOUNTER — Other Ambulatory Visit: Payer: Self-pay

## 2019-03-11 DIAGNOSIS — F411 Generalized anxiety disorder: Secondary | ICD-10-CM

## 2019-03-11 NOTE — Telephone Encounter (Signed)
Absolutely, please place referral to Behavioral Medicine for anxiety Thanks

## 2019-03-12 ENCOUNTER — Ambulatory Visit (AMBULATORY_SURGERY_CENTER): Payer: Medicaid Other | Admitting: Internal Medicine

## 2019-03-12 ENCOUNTER — Other Ambulatory Visit: Payer: Self-pay

## 2019-03-12 ENCOUNTER — Encounter: Payer: Self-pay | Admitting: Internal Medicine

## 2019-03-12 VITALS — BP 117/80 | HR 73 | Temp 97.9°F | Resp 12

## 2019-03-12 DIAGNOSIS — R1013 Epigastric pain: Secondary | ICD-10-CM

## 2019-03-12 DIAGNOSIS — K209 Esophagitis, unspecified without bleeding: Secondary | ICD-10-CM

## 2019-03-12 DIAGNOSIS — K2100 Gastro-esophageal reflux disease with esophagitis, without bleeding: Secondary | ICD-10-CM | POA: Diagnosis not present

## 2019-03-12 MED ORDER — AMITRIPTYLINE HCL 50 MG PO TABS: 50.0000 mg | ORAL_TABLET | Freq: Every day | ORAL | 3 refills | Status: DC

## 2019-03-12 MED ORDER — SODIUM CHLORIDE 0.9 % IV SOLN: 500.0000 mL | INTRAVENOUS | Status: DC

## 2019-03-12 MED ORDER — DEXILANT 60 MG PO CPDR: 60.0000 mg | DELAYED_RELEASE_CAPSULE | Freq: Every day | ORAL | 3 refills | Status: DC

## 2019-03-12 NOTE — Patient Instructions (Signed)
Pick up 2 new Rx's today.    3 month office follow up.  YOU HAD AN ENDOSCOPIC PROCEDURE TODAY AT [facility]:   Refer to the procedure report that was given to you for any specific questions about what was found during the examination.  If the procedure report does not answer your questions, please call your gastroenterologist to clarify.  If you requested that your care partner not be given the details of your procedure findings, then the procedure report has been included in a sealed envelope for you to review at your convenience later.  YOU SHOULD EXPECT: Some feelings of bloating in the abdomen. Passage of more gas than usual.  Walking can help get rid of the air that was put into your GI tract during the procedure and reduce the bloating. If you had a lower endoscopy (such as a colonoscopy or flexible sigmoidoscopy) you may notice spotting of blood in your stool or on the toilet paper. If you underwent a bowel prep for your procedure, you may not have a normal bowel movement for a few days.  Please Note:  You might notice some irritation and congestion in your nose or some drainage.  This is from the oxygen used during your procedure.  There is no need for concern and it should clear up in a day or so.  SYMPTOMS TO REPORT IMMEDIATELY:   Following upper endoscopy (EGD)  Vomiting of blood or coffee ground material  New chest pain or pain under the shoulder blades  Painful or persistently difficult swallowing  New shortness of breath  Fever of 100F or higher  Black, tarry-looking stools  For urgent or emergent issues, a gastroenterologist can be reached at any hour by calling [phone].   DIET:  We do recommend a small meal at first, but then you may proceed to your regular diet.  Drink plenty of fluids but you should avoid alcoholic beverages for 24 hours.  ACTIVITY:  You should plan to take it easy for the rest of today and you should NOT DRIVE or use heavy  machinery until tomorrow (because of the sedation medicines used during the test).    FOLLOW UP: Our staff will call the number listed on your records 48-72 hours following your procedure to check on you and address any questions or concerns that you may have regarding the information given to you following your procedure. If we do not reach you, we will leave a message.  We will attempt to reach you two times.  During this call, we will ask if you have developed any symptoms of COVID 19. If you develop any symptoms (ie: fever, flu-like symptoms, shortness of breath, cough etc.) before then, please call [phone].  If you test positive for Covid 19 in the 2 weeks post procedure, please call and report this information to [place].    If any biopsies were taken you will be contacted by phone or by letter within the next 1-3 weeks.  Please call us at [phone] if you have not heard about the biopsies in 3 weeks.    SIGNATURES/CONFIDENTIALITY: You and/or your care partner have signed paperwork which will be entered into your electronic medical record.  These signatures attest to the fact that that the information above on your After Visit Summary has been reviewed and is understood.  Full responsibility of the confidentiality of this discharge information lies with you and/or your care-partner.

## 2019-03-12 NOTE — Progress Notes (Signed)
A and O x3. Report to RN. Tolerated MAC anesthesia well.Teeth unchanged after procedure.

## 2019-03-12 NOTE — Op Note (Signed)
[place] Patient Name: Lydia [name] Procedure Date: 03/12/2019 9:41 AM MRN: [id number] Endoscopist: Jerene Bears , MD Age: 33 Referring MD:  Date of Birth: 1986-04-11 Gender: Female Account #: [id number] Procedure:                Upper GI endoscopy Indications:              Epigastric abdominal pain Medicines:                Monitored Anesthesia Care Procedure:                Pre-Anesthesia Assessment:                           - Prior to the procedure, a History and Physical                            was performed, and patient medications and                            allergies were reviewed. The patient's tolerance of                            previous anesthesia was also reviewed. The risks                            and benefits of the procedure and the sedation                            options and risks were discussed with the patient.                            All questions were answered, and informed consent                            was obtained. Prior Anticoagulants: The patient has                            taken no previous anticoagulant or antiplatelet                            agents. ASA Grade Assessment: II - A patient with                            mild systemic disease. After reviewing the risks                            and benefits, the patient was deemed in                            satisfactory condition to undergo the procedure.                           After obtaining informed consent, the endoscope was  passed under direct vision. Throughout the                            procedure, the patient's blood pressure, pulse, and                            oxygen saturations were monitored continuously. The                            Endoscope was introduced through the mouth, and                            advanced to the second part of duodenum. The upper                            GI endoscopy was accomplished  without difficulty.                            The patient tolerated the procedure well. Scope In: Scope Out: Findings:                 LA Grade A (one or more mucosal breaks less than 5                            mm, not extending between tops of 2 mucosal folds)                            esophagitis was found at the gastroesophageal                            junction.                           The entire examined stomach was normal.                           The examined duodenum was normal. Complications:            No immediate complications. Estimated Blood Loss:     Estimated blood loss: none. Impression:               - Very mild reflux esophagitis at GE junction.                           - Normal stomach.                           - Normal examined duodenum.                           - No specimens collected. Recommendation:           - Patient has a contact number available for                            emergencies. The signs and symptoms of potential  delayed complications were discussed with the                            patient. Return to normal activities tomorrow.                            Written discharge instructions were provided to the                            patient.                           - Resume previous diet.                           - Given esophagitis and twice daily pantoprazole,                            change to Dexilant 60 mg once daily.                           - Suspicion for functional dyspepsia causing                            abdominal pain, thus start amitriptyline 50 mg at                            bedtime. Can dose titrate if helpful.                           - Office followup in about 3 months. Jerene Bears, MD 03/12/2019 9:56:17 AM This report has been signed electronically.

## 2019-03-12 NOTE — Progress Notes (Signed)
Temp jb V/s cw  I have reviewed the patient's medical history in detail and updated the computerized patient record.

## 2019-03-16 ENCOUNTER — Telehealth: Payer: Self-pay

## 2019-03-16 ENCOUNTER — Telehealth: Payer: Self-pay | Admitting: *Deleted

## 2019-03-16 NOTE — Telephone Encounter (Signed)
  Follow up Call-  Call back number 03/12/2019  Post procedure Call Back phone  # [phone]  Permission to leave phone message Yes  Some recent data might be hidden     Patient questions:  Message left to call us if necessary.

## 2019-03-16 NOTE — Telephone Encounter (Signed)
  Follow up Call-  Call back number 03/12/2019  Post procedure Call Back phone  # [phone]  Permission to leave phone message Yes  Some recent data might be hidden     Patient questions:  Do you have a fever, pain , or abdominal swelling? No. Pain Score  0 *  Have you tolerated food without any problems? Yes.    Have you been able to return to your normal activities? Yes.    Do you have any questions about your discharge instructions: Diet   No. Medications  No. Follow up visit  No.  Do you have questions or concerns about your Care? No.  Actions: * If pain score is 4 or above: No action needed, pain <4.  1. Have you developed a fever since your procedure? no  2.   Have you had an respiratory symptoms (SOB or cough) since your procedure? no  3.   Have you tested positive for COVID 19 since your procedure? no  4.   Have you had any family members/close contacts diagnosed with the COVID 19 since your procedure?  no   If yes to any of these questions please route to Joylene John, RN and Alphonsa Gin, Therapist, sports.

## 2019-03-18 ENCOUNTER — Other Ambulatory Visit: Payer: Self-pay

## 2019-03-18 DIAGNOSIS — F411 Generalized anxiety disorder: Secondary | ICD-10-CM

## 2019-03-24 ENCOUNTER — Telehealth: Payer: Self-pay

## 2019-03-24 NOTE — Telephone Encounter (Signed)
Phoned [employer] at [phone] to initiate PA for Dexilant 60mg  qd.  Prior Auth approved [id number]) for 1 year through November 2021. Interaction # [id number].

## 2019-05-10 ENCOUNTER — Ambulatory Visit: Payer: Medicaid Other | Attending: Internal Medicine

## 2019-05-10 DIAGNOSIS — R238 Other skin changes: Secondary | ICD-10-CM

## 2019-05-10 DIAGNOSIS — U071 COVID-19: Secondary | ICD-10-CM

## 2019-05-12 LAB — NOVEL CORONAVIRUS, NAA: SARS-CoV-2, NAA: NOT DETECTED

## 2019-05-31 ENCOUNTER — Ambulatory Visit: Payer: Medicaid Other | Admitting: Physician Assistant

## 2019-05-31 ENCOUNTER — Encounter: Payer: Self-pay | Admitting: Physician Assistant

## 2019-05-31 VITALS — BP 98/66 | HR 60 | Temp 97.6°F | Ht <= 58 in | Wt 155.5 lb

## 2019-05-31 DIAGNOSIS — R87619 Unspecified abnormal cytological findings in specimens from cervix uteri: Secondary | ICD-10-CM

## 2019-05-31 DIAGNOSIS — R1013 Epigastric pain: Secondary | ICD-10-CM

## 2019-05-31 NOTE — Patient Instructions (Addendum)
If you are age 33 or older, your body mass index should be between 23-30. Your Body mass index is 34.86 kg/m. If this is out of the aforementioned range listed, please consider follow up with your Primary Care Provider.  If you are age 65 or younger, your body mass index should be between 19-25. Your Body mass index is 34.86 kg/m. If this is out of the aformentioned range listed, please consider follow up with your Primary Care Provider.   Referral placed to OB-GYN.   Continue Dexilant & Amitriptyline.

## 2019-05-31 NOTE — Progress Notes (Signed)
Chief Complaint: Follow-up dyspepsia and epigastric pain  HPI:    Lydia [name] is a 34 year old female with a past medical history as listed below, known to Dr. Hilarie Fredrickson, who presents to clinic today for follow-up of her dyspepsia and epigastric pain.    01/05/2019 patient saw Dr. Elmo Putt and described bloating and epigastric discomfort.  She was on pantoprazole twice a day and it was not helping.  It was recommended she have an EGD.  She was also continued on Linzess 145 mcg 2 days on and off 1 day.  For chronic constipation.    03/12/2019 EGD with mild reflux esophagitis.  Pantoprazole stopped.  Prescribe Dexilant 60 mg daily.  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    Today, the patient tells me that she is doing well.  She no longer has the epigastric discomfort that she described before.  Very occasionally she will have a little bit of stomach upset but this only lasts for a very short time does not happen often.  Patient is happy with her current medications including Dexilant 60 mg daily, Amitriptyline 50 mg nightly every other day and Linzess.    Does ask if we can refer her to the gynecologist because she has a history of abnormal Pap smears.  She has been unable to connect with one.    Denies fever, chills, weight loss or symptoms that awaken her from sleep.  Past Medical History:  Diagnosis Date  . Anxiety   . Chronic headaches    none x 2-3 yrs  . Infection    UTI  . Meningitis   . Ovarian cyst     Past Surgical History:  Procedure Laterality Date  . CHOLECYSTECTOMY  2016  . UPPER GASTROINTESTINAL ENDOSCOPY  2015  . WISDOM TOOTH EXTRACTION      Current Outpatient Medications  Medication Sig Dispense Refill  . amitriptyline (ELAVIL) 50 MG tablet Take 1 tablet (50 mg total) by mouth at bedtime. 90 tablet 3  . dexlansoprazole (DEXILANT) 60 MG capsule Take 1 capsule (60 mg total) by mouth daily. 90 capsule 3  . JUNEL 1.5/30 1.5-30 MG-MCG tablet TK 1  T PO D    . linaclotide (LINZESS) 145 MCG CAPS capsule Take 1 capsule (145 mcg total) by mouth daily before breakfast. 30 capsule 3   No current facility-administered medications for this visit.    Allergies as of 05/31/2019 - Review Complete 05/31/2019  Allergen Reaction Noted  . Penicillins Hives 11/30/2007    Family History  Problem Relation Age of Onset  . Hypertension Mother   . Breast cancer Maternal Aunt   . Birth defects Daughter        Limb reduction defect  . Colon cancer Neg Hx   . Esophageal cancer Neg Hx   . Rectal cancer Neg Hx   . Stomach cancer Neg Hx   . Colon polyps Neg Hx     Social History   Socioeconomic History  . Marital status: Married    Spouse name: Not on file  . Number of children: 2  . Years of education: Not on file  . Highest education level: Not on file  Occupational History  . Not on file  Tobacco Use  . Smoking status: Never Smoker  . Smokeless tobacco: Never Used  Substance and Sexual Activity  . Alcohol use: No    Alcohol/week: 0.0 standard drinks  . Drug use: No  . Sexual activity: Yes    Partners: Male  Birth control/protection: None, Pill  Other Topics Concern  . Not on file  Social History Narrative  . Not on file   Social Determinants of Health   Financial Resource Strain:   . Difficulty of Paying Living Expenses: Not on file  Food Insecurity:   . Worried About Charity fundraiser in the Last Year: Not on file  . Ran Out of Food in the Last Year: Not on file  Transportation Needs:   . Lack of Transportation (Medical): Not on file  . Lack of Transportation (Non-Medical): Not on file  Physical Activity:   . Days of Exercise per Week: Not on file  . Minutes of Exercise per Session: Not on file  Stress:   . Feeling of Stress : Not on file  Social Connections:   . Frequency of Communication with Friends and Family: Not on file  . Frequency of Social Gatherings with Friends and Family: Not on file  . Attends  Religious Services: Not on file  . Active Member of Clubs or Organizations: Not on file  . Attends Archivist Meetings: Not on file  . Marital Status: Not on file  Intimate Partner Violence:   . Fear of Current or Ex-Partner: Not on file  . Emotionally Abused: Not on file  . Physically Abused: Not on file  . Sexually Abused: Not on file    Review of Systems:    Constitutional: No weight loss, fever or chills Cardiovascular: No chest pain Respiratory: No SOB Gastrointestinal: See HPI and otherwise negative   Physical Exam:  Vital signs: BP 98/66   Pulse 60   Temp 97.6 F (36.4 C)   Ht 4\' 8"  (1.422 m)   Wt 155 lb 8 oz (70.5 kg)   BMI 34.86 kg/m   Constitutional:   Pleasant female appears to be in NAD, Well developed, Well nourished, alert and cooperative Respiratory: Respirations even and unlabored. Lungs clear to auscultation bilaterally.   No wheezes, crackles, or rhonchi.  Cardiovascular: Normal S1, S2. No MRG. Regular rate and rhythm. No peripheral edema, cyanosis or pallor.  Gastrointestinal:  Soft, nondistended, nontender. No rebound or guarding. Normal bowel sounds. No appreciable masses or hepatomegaly. Rectal:  Not performed.  Psychiatric: Demonstrates good judgement and reason without abnormal affect or behaviors.  No recent labs or imaging.  Assessment: 1.  Dyspepsia: Relieved with Dexilant 60 mg daily and Amitriptyline 50 mg nightly 2.  Chronic constipation: Relieved with Linzess 145 mcg 1 tablet 2 days in a row then skip a day  Plan: 1.  Continue current medications. 2.  Sent patient a referral to OB/GYN for history of abnormal Pap smears. 3.  Patient to follow in clinic with [place] in a year or sooner if necessary.  Ellouise Newer, PA-C [facility] 05/31/2019, 3:11 PM  Cc: No ref. provider found

## 2019-06-03 NOTE — Progress Notes (Signed)
Addendum: Reviewed and agree with assessment and management plan. Zavannah Deblois M, MD  

## 2019-06-21 ENCOUNTER — Encounter: Payer: Self-pay | Admitting: Obstetrics

## 2019-06-21 ENCOUNTER — Other Ambulatory Visit: Payer: Self-pay

## 2019-06-21 ENCOUNTER — Ambulatory Visit (INDEPENDENT_AMBULATORY_CARE_PROVIDER_SITE_OTHER): Payer: Medicaid Other | Admitting: Obstetrics

## 2019-06-21 ENCOUNTER — Other Ambulatory Visit (HOSPITAL_COMMUNITY)
Admission: RE | Admit: 2019-06-21 | Discharge: 2019-06-21 | Disposition: A | Discharge status: Home / Self Care | Payer: Medicaid Other | Source: Ambulatory Visit | Attending: Obstetrics | Admitting: Obstetrics

## 2019-06-21 VITALS — BP 106/72 | HR 67 | Wt 154.0 lb

## 2019-06-21 DIAGNOSIS — Z01419 Encounter for gynecological examination (general) (routine) without abnormal findings: Secondary | ICD-10-CM

## 2019-06-21 DIAGNOSIS — Z124 Encounter for screening for malignant neoplasm of cervix: Secondary | ICD-10-CM | POA: Insufficient documentation

## 2019-06-21 DIAGNOSIS — Z0000 Encounter for general adult medical examination without abnormal findings: Secondary | ICD-10-CM

## 2019-06-21 DIAGNOSIS — R102 Pelvic and perineal pain: Secondary | ICD-10-CM

## 2019-06-21 NOTE — Progress Notes (Signed)
Patient presents for Annual Exam today.  LMP: 05/28/19 Monthly  Last pap:02/27/2016 WNL  Contraception: Pill Junel  STD Screening; Declines   CC: NONE

## 2019-06-21 NOTE — Progress Notes (Signed)
Subjective:        Lydia [name] is a 34 y.o. female here for a routine exam.  Current complaints: Pelvic pain, particularly with intercourse.  This has been a long-term problem, with negative ultrasound findings in the past.  Personal health questionnaire:  Is [name], have a family history of breast and/or ovarian cancer: yes Is there a family history of uterine cancer diagnosed at age < 41, gastrointestinal cancer, urinary tract cancer, family member who is a Field seismologist syndrome-associated carrier: no Is the patient overweight and hypertensive, family history of diabetes, personal history of gestational diabetes, preeclampsia or PCOS: yes Is patient over 31, have PCOS,  family history of premature CHD under age 20, diabetes, smoke, have hypertension or peripheral artery disease:  no At any time, has a partner hit, kicked or otherwise hurt or frightened you?: no Over the past 2 weeks, have you felt down, depressed or hopeless?: no Over the past 2 weeks, have you felt little interest or pleasure in doing things?:no   Gynecologic History Patient's last menstrual period was 05/28/2019 (exact date). Contraception: OCP (estrogen/progesterone) Last Pap: 02-27-2016. Results were: normal Last mammogram: n/a. Results were: n/a  Obstetric History OB History  Gravida Para Term Preterm AB Living  4 3 3  0 1 3  SAB TAB Ectopic Multiple Live Births  1 0 0 0 3    # Outcome Date GA Lbr Len/2nd Weight Sex Delivery Anes PTL Lv  4 Term 08/03/15 [redacted]w[redacted]d 20:25 / 00:40 8 lb 1.5 oz (3.671 kg) M Vag-Spont EPI  LIV  3 Term 12/30/10 [redacted]w[redacted]d 10:50 / 00:29 6 lb 1.4 oz (2.761 kg) F Vag-Spont EPI  LIV     Birth Comments: left arm below elbow missing  2 Term 11/28/04 [redacted]w[redacted]d  6 lb 14 oz (3.118 kg) F Vag-Spont EPI  LIV  1 SAB             Past Medical History:  Diagnosis Date  . Anxiety   . Chronic headaches    none x 2-3 yrs  . Infection    UTI  . Meningitis   . Ovarian cyst     Past  Surgical History:  Procedure Laterality Date  . CHOLECYSTECTOMY  2016  . UPPER GASTROINTESTINAL ENDOSCOPY  2015  . WISDOM TOOTH EXTRACTION       Current Outpatient Medications:  .  amitriptyline (ELAVIL) 50 MG tablet, Take 1 tablet (50 mg total) by mouth at bedtime., Disp: 90 tablet, Rfl: 3 .  dexlansoprazole (DEXILANT) 60 MG capsule, Take 1 capsule (60 mg total) by mouth daily., Disp: 90 capsule, Rfl: 3 .  JUNEL 1.5/30 1.5-30 MG-MCG tablet, TK 1 T PO D, Disp: , Rfl:  .  linaclotide (LINZESS) 145 MCG CAPS capsule, Take 1 capsule (145 mcg total) by mouth daily before breakfast., Disp: 30 capsule, Rfl: 3 Allergies  Allergen Reactions  . Penicillins Hives    Has patient had a PCN reaction causing immediate rash, facial/tongue/throat swelling, SOB or lightheadedness with hypotension: Yes Has patient had a PCN reaction causing severe rash involving mucus membranes or skin necrosis: Yes Has patient had a PCN reaction that required hospitalization no Has patient had a PCN reaction occurring within the last 10 years: No If all of the above answers are "NO", then may proceed with Cephalosporin use.    Social History   Tobacco Use  . Smoking status: Never Smoker  . Smokeless tobacco: Never Used  Substance Use Topics  . Alcohol use:  No    Alcohol/week: 0.0 standard drinks    Family History  Problem Relation Age of Onset  . Hypertension Mother   . Breast cancer Maternal Aunt   . Birth defects Daughter        Limb reduction defect  . Colon cancer Neg Hx   . Esophageal cancer Neg Hx   . Rectal cancer Neg Hx   . Stomach cancer Neg Hx   . Colon polyps Neg Hx       Review of Systems  Constitutional: negative for fatigue and weight loss Respiratory: negative for cough and wheezing Cardiovascular: negative for chest pain, fatigue and palpitations Gastrointestinal: negative for abdominal pain and change in bowel habits Musculoskeletal:negative for myalgias Neurological: negative for  gait problems and tremors Behavioral/Psych: negative for abusive relationship, depression Endocrine: negative for temperature intolerance    Genitourinary:negative for abnormal menstrual periods, genital lesions, hot flashes, sexual problems and vaginal discharge Integument/breast: negative for breast lump, breast tenderness, nipple discharge and skin lesion(s)    Objective:       BP 106/72   Pulse 67   Wt 154 lb (69.9 kg)   LMP 05/28/2019 (Exact Date)   BMI 34.53 kg/m  General:   alert  Skin:   no rash or abnormalities  Lungs:   clear to auscultation bilaterally  Heart:   regular rate and rhythm, S1, S2 normal, no murmur, click, rub or gallop  Breasts:   normal without suspicious masses, skin or nipple changes or axillary nodes  Abdomen:  normal findings: no organomegaly, soft, non-tender and no hernia  Pelvis:  External genitalia: normal general appearance Urinary system: urethral meatus normal and bladder without fullness, nontender Vaginal: normal without tenderness, induration or masses Cervix: normal appearance Adnexa: normal bimanual exam Uterus: anteverted and non-tender, normal size   Lab Review Urine pregnancy test Labs reviewed yes Radiologic studies reviewed yes  50% of 25 min visit spent on counseling and coordination of care.   Assessment:     1. Encounter for annual routine gynecological examination  2. Screening for cervical cancer Rx: - Cytology - PAP( [facility])  3. Pelvic pain Rx: - US PELVIC COMPLETE WITH TRANSVAGINAL; Future   Plan:    Education reviewed: calcium supplements, depression evaluation, low fat, low cholesterol diet, safe sex/STD prevention, self breast exams and weight bearing exercise. Contraception: OCP (estrogen/progesterone). Follow up in: 2 weeks. Ultrasound ordered    Orders Placed This Encounter  Procedures  . US PELVIC COMPLETE WITH TRANSVAGINAL    Standing Status:   Future    Standing Expiration Date:   08/18/2020     Order Specific Question:   Reason for Exam (SYMPTOM  OR DIAGNOSIS REQUIRED)    Answer:   Pelvic pain    Order Specific Question:   Preferred imaging location?    Answer:   [place], MD 06/21/2019 10:46 AM

## 2019-06-22 LAB — CYTOLOGY - PAP
Comment: NEGATIVE
Diagnosis: NEGATIVE
High risk HPV: NEGATIVE

## 2019-06-25 ENCOUNTER — Other Ambulatory Visit: Payer: Self-pay

## 2019-06-25 ENCOUNTER — Ambulatory Visit (HOSPITAL_COMMUNITY)
Admission: RE | Admit: 2019-06-25 | Discharge: 2019-06-25 | Disposition: A | Discharge status: Home / Self Care | Payer: Medicaid Other | Source: Ambulatory Visit | Attending: Obstetrics | Admitting: Obstetrics

## 2019-06-25 DIAGNOSIS — R102 Pelvic and perineal pain: Secondary | ICD-10-CM | POA: Diagnosis present

## 2019-07-05 ENCOUNTER — Telehealth (INDEPENDENT_AMBULATORY_CARE_PROVIDER_SITE_OTHER): Payer: Medicaid Other | Admitting: Obstetrics

## 2019-07-05 ENCOUNTER — Encounter: Payer: Self-pay | Admitting: Obstetrics

## 2019-07-05 DIAGNOSIS — R102 Pelvic and perineal pain: Secondary | ICD-10-CM

## 2019-07-05 DIAGNOSIS — N941 Unspecified dyspareunia: Secondary | ICD-10-CM

## 2019-07-05 DIAGNOSIS — Z3041 Encounter for surveillance of contraceptive pills: Secondary | ICD-10-CM | POA: Diagnosis not present

## 2019-07-05 MED ORDER — JUNEL 1.5/30 1.5-30 MG-MCG PO TABS: ORAL_TABLET | ORAL | 11 refills | Status: DC

## 2019-07-05 NOTE — Progress Notes (Signed)
S/w pt for virtual visit to discuss U/S results from 06-25-19. Pt is also requesting refill on BC pills.

## 2019-07-05 NOTE — Progress Notes (Signed)
TELEHEALTH VIRTUAL GYNECOLOGY VISIT ENCOUNTER NOTE  I connected with Lydia [name] on 07/05/19 at  2:00 PM EST by telephone at home and verified that I am speaking with the correct person using two identifiers.   I discussed the limitations, risks, security and privacy concerns of performing an evaluation and management service by telephone and the availability of in person appointments. I also discussed with the patient that there may be a patient responsible charge related to this service. The patient expressed understanding and agreed to proceed.   History:  Lydia [name] is a 34 y.o. [phone] female being evaluated today for history of chronic pelvic pain and dyspareunia. She denies any abnormal vaginal discharge, bleeding, or other concerns.       Past Medical History:  Diagnosis Date  . Anxiety   . Chronic headaches    none x 2-3 yrs  . Infection    UTI  . Meningitis   . Ovarian cyst    Past Surgical History:  Procedure Laterality Date  . CHOLECYSTECTOMY  2016  . UPPER GASTROINTESTINAL ENDOSCOPY  2015  . WISDOM TOOTH EXTRACTION     The following portions of the patient's history were reviewed and updated as appropriate: allergies, current medications, past family history, past medical history, past social history, past surgical history and problem list.   Health Maintenance:  Normal pap and negative HRHPV on 06-21-2019.    Review of Systems:  Pertinent items noted in HPI and remainder of comprehensive ROS otherwise negative.  Physical Exam:   General:  Alert, oriented and cooperative.   Mental Status: Normal mood and affect perceived. Normal judgment and thought content.  Physical exam deferred due to nature of the encounter  Labs and Imaging No results found for this or any previous visit (from the past 336 hour(s)). US PELVIC COMPLETE WITH TRANSVAGINAL  Result Date: 06/25/2019 CLINICAL DATA:  Pelvic pain, dyspareunia EXAM: TRANSABDOMINAL AND TRANSVAGINAL ULTRASOUND  OF PELVIS TECHNIQUE: Both transabdominal and transvaginal ultrasound examinations of the pelvis were performed. Transabdominal technique was performed for global imaging of the pelvis including uterus, ovaries, adnexal regions, and pelvic cul-de-sac. It was necessary to proceed with endovaginal exam following the transabdominal exam to visualize the endometrium. COMPARISON:  None FINDINGS: Uterus Measurements: 8.4 x 3.6 x 4.4 cm = volume: 70 mL. Anteverted. Heterogeneous myometrium. No focal mass. Endometrium Thickness: 2 mm.  No endometrial fluid or focal abnormality Right ovary Measurements: 2.1 x 1.3 x 1.8 cm = volume: 2.6 mL. Normal morphology without mass Left ovary Measurements: 2.2 x 1.3 x 2.2 cm = volume: 3.3 mL. Normal morphology without mass Other findings No free pelvic fluid.  No adnexal masses. IMPRESSION: Normal exam. Electronically Signed   By: Lavonia Dana M.D.   On: 06/25/2019 10:00      Assessment and Plan:     1. Pelvic pain, chronic Rx: - Ambulatory referral to Urogynecology  2. Dyspareunia, female  3. Encounter for surveillance of contraceptive pills Rx: - JUNEL 1.5/30 1.5-30 MG-MCG tablet; Take one tablet po daily.  Dispense: 1 Package; Refill: 11       I discussed the assessment and treatment plan with the patient. The patient was provided an opportunity to ask questions and all were answered. The patient agreed with the plan and demonstrated an understanding of the instructions.   The patient was advised to call back or seek an in-person evaluation/go to the ED if the symptoms worsen or if the condition fails to improve as anticipated.  I provided 10 minutes of non-face-to-face time during this encounter.   Baltazar Najjar, MD [facility] 07/05/2019

## 2019-07-19 ENCOUNTER — Encounter: Payer: Self-pay | Admitting: Family Medicine

## 2019-07-19 ENCOUNTER — Ambulatory Visit: Payer: Self-pay

## 2019-07-19 ENCOUNTER — Other Ambulatory Visit: Payer: Self-pay

## 2019-07-19 ENCOUNTER — Ambulatory Visit (INDEPENDENT_AMBULATORY_CARE_PROVIDER_SITE_OTHER): Payer: Medicaid Other | Admitting: Family Medicine

## 2019-07-19 DIAGNOSIS — M542 Cervicalgia: Secondary | ICD-10-CM | POA: Diagnosis not present

## 2019-07-19 MED ORDER — CELECOXIB 200 MG PO CAPS: 200.0000 mg | ORAL_CAPSULE | Freq: Two times a day (BID) | ORAL | 6 refills | Status: DC | PRN

## 2019-07-19 MED ORDER — TIZANIDINE HCL 2 MG PO TABS: 2.0000 mg | ORAL_TABLET | Freq: Every evening | ORAL | 1 refills | Status: DC | PRN

## 2019-07-19 MED ORDER — VITAMIN D-3 125 MCG (5000 UT) PO TABS: 1.0000 | ORAL_TABLET | Freq: Every day | ORAL | 3 refills | Status: DC

## 2019-07-19 NOTE — Progress Notes (Signed)
Office Visit Note   Patient: Lydia [name]           Date of Birth: 1985/09/19           MRN: [id number] Visit Date: 07/19/2019 Requested by: No referring provider defined for this encounter. PCP: Patient, No Pcp Per  Subjective: Chief Complaint  Patient presents with  . Neck - Pain    Pain posterior neck (intermittent) x 3 weeks. NKI. Develops a headache when the neck starts to hurt. Some pain into the muscles of the upper back/posterior shoulders. No radiating pain down the arms.    HPI: She is here with neck pain.  About 3 weeks ago she woke up with pain and has not quit hurting since then.  No injury, nothing changed the day before.  She has been using Tylenol with minimal improvement.  Pain radiates to the occipital area and into the trapezius areas.  She feels intermittent heaviness in her hands but no weakness or numbness.  She is right-hand dominant.  No previous problems with her neck.  She is otherwise in good health.              ROS:   All other systems were reviewed and are negative.  Objective: Vital Signs: There were no vitals taken for this visit.  Physical Exam:  General:  Alert and oriented, in no acute distress. Pulm:  Breathing unlabored. Psy:  Normal mood, congruent affect. Skin: No visible rash. Neck: Good range of motion, negative Spurling's test.  She has multiple tender trigger points in the paraspinous muscles, the trapezius bellies in the rhomboid region on the right.  Upper extremity strength and reflexes are normal.  Imaging: X-ray cervical spine: Possibly some very early facet degenerative change at the lowest levels but overall her x-rays look unremarkable.  Assessment & Plan: 1.  Probable myofascial neck pain -Discussed options with her and elected to try physical therapy, Zanaflex as needed, Celebrex as needed.  We will empirically treat with vitamin D3.  Consider MRI scan if she fails to improve.     Procedures: No procedures performed  No  notes on file     PMFS History: Patient Active Problem List   Diagnosis Date Noted  . Acute mastitis 08/08/2015  . NSVD (normal spontaneous vaginal delivery) 08/03/2015  . Indication for care in labor and delivery, antepartum 08/02/2015  . Family history of congenital anomalies   . Dyspareunia, female 03/11/2014  . Acute recurrent maxillary sinusitis 03/11/2014  . Abdominal pain, epigastric 01/26/2014  . Tinea versicolor 12/08/2013  . Pain 12/08/2013  . Unspecified inflammatory disease of female pelvic organs and tissues 12/08/2013  . Pelvic pain in female 07/06/2013  . Dysmenorrhea 10/21/2012  . Unspecified symptom associated with female genital organs 10/07/2012  . Follicular cyst of ovary 10/07/2012  . Other and unspecified ovarian cyst 10/07/2012  . 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 HEADACHE 11/30/2007   Past Medical History:  Diagnosis Date  . Anxiety   . Chronic headaches    none x 2-3 yrs  . Infection    UTI  . Meningitis   . Ovarian cyst     Family History  Problem Relation Age of Onset  . Hypertension Mother   . Breast cancer Maternal Aunt   . Birth defects Daughter        Limb reduction defect  . Colon cancer Neg Hx   . Esophageal cancer Neg Hx   . Rectal cancer Neg Hx   .  Stomach cancer Neg Hx   . Colon polyps Neg Hx     Past Surgical History:  Procedure Laterality Date  . CHOLECYSTECTOMY  2016  . UPPER GASTROINTESTINAL ENDOSCOPY  2015  . WISDOM TOOTH EXTRACTION     Social History   Occupational History  . Not on file  Tobacco Use  . Smoking status: Never Smoker  . Smokeless tobacco: Never Used  Substance and Sexual Activity  . Alcohol use: No    Alcohol/week: 0.0 standard drinks  . Drug use: No  . Sexual activity: Yes    Partners: Male    Birth control/protection: None, Pill

## 2019-07-28 ENCOUNTER — Ambulatory Visit: Payer: Medicaid Other | Admitting: Physical Therapy

## 2019-07-29 ENCOUNTER — Other Ambulatory Visit: Payer: Self-pay

## 2019-07-29 ENCOUNTER — Encounter: Payer: Self-pay | Admitting: Nurse Practitioner

## 2019-07-29 ENCOUNTER — Telehealth (INDEPENDENT_AMBULATORY_CARE_PROVIDER_SITE_OTHER): Payer: Medicaid Other | Admitting: Nurse Practitioner

## 2019-07-29 VITALS — Ht <= 58 in

## 2019-07-29 DIAGNOSIS — K2100 Gastro-esophageal reflux disease with esophagitis, without bleeding: Secondary | ICD-10-CM | POA: Diagnosis not present

## 2019-07-29 NOTE — Patient Instructions (Addendum)
Resume dexilant as prescribed by GI.  Low-FODMAP Eating Plan  FODMAPs (fermentable oligosaccharides, disaccharides, monosaccharides, and polyols) are sugars that are hard for some people to digest. A low-FODMAP eating plan may help some people who have bowel (intestinal) diseases to manage their symptoms. This meal plan can be complicated to follow. Work with a diet and nutrition specialist (dietitian) to make a low-FODMAP eating plan that is right for you. A dietitian can make sure that you get enough nutrition from this diet. What are tips for following this plan? Reading food labels  Check labels for hidden FODMAPs such as: ? High-fructose syrup. ? Honey. ? Agave. ? Natural fruit flavors. ? Onion or garlic powder.  Choose low-FODMAP foods that contain 3-4 grams of fiber per serving.  Check food labels for serving sizes. Eat only one serving at a time to make sure FODMAP levels stay low. Meal planning  Follow a low-FODMAP eating plan for up to 6 weeks, or as told by your health care provider or dietitian.  To follow the eating plan: 1. Eliminate high-FODMAP foods from your diet completely. 2. Gradually reintroduce high-FODMAP foods into your diet one at a time. Most people should wait a few days after introducing one high-FODMAP food before they introduce the next high-FODMAP food. Your dietitian can recommend how quickly you may reintroduce foods. 3. Keep a daily record of what you eat and drink, and make note of any symptoms that you have after eating. 4. Review your daily record with a dietitian regularly. Your dietitian can help you identify which foods you can eat and which foods you should avoid. General tips  Drink enough fluid each day to keep your urine pale yellow.  Avoid processed foods. These often have added sugar and may be high in FODMAPs.  Avoid most dairy products, whole grains, and sweeteners.  Work with a dietitian to make sure you get enough fiber in your  diet. Recommended foods Grains  Gluten-free grains, such as rice, oats, buckwheat, quinoa, corn, polenta, and millet. Gluten-free pasta, bread, or cereal. Rice noodles. Corn tortillas. Vegetables  Eggplant, zucchini, cucumber, peppers, green beans, Brussels sprouts, bean sprouts, lettuce, arugula, kale, Swiss chard, spinach, collard greens, bok choy, summer squash, potato, and tomato. Limited amounts of corn, carrot, and sweet potato. Green parts of scallions. Fruits  Bananas, oranges, lemons, limes, blueberries, raspberries, strawberries, grapes, cantaloupe, honeydew melon, kiwi, papaya, passion fruit, and pineapple. Limited amounts of dried cranberries, banana chips, and shredded coconut. Dairy  Lactose-free milk, yogurt, and kefir. Lactose-free cottage cheese and ice cream. Non-dairy milks, such as almond, coconut, hemp, and rice milk. Yogurts made of non-dairy milks. Limited amounts of goat cheese, brie, mozzarella, parmesan, swiss, and other hard cheeses. Meats and other protein foods  Unseasoned beef, pork, poultry, or fish. Eggs. Berniece Salines. Tofu (firm) and tempeh. Limited amounts of nuts and seeds, such as almonds, walnuts, [place] nuts, pecans, peanuts, pumpkin seeds, chia seeds, and sunflower seeds. Fats and oils  Butter-free spreads. Vegetable oils, such as olive, canola, and sunflower oil. Seasoning and other foods  Artificial sweeteners with names that do not end in "ol" such as aspartame, saccharine, and stevia. Maple syrup, white table sugar, raw sugar, brown sugar, and molasses. Fresh basil, coriander, parsley, rosemary, and thyme. Beverages  Water and mineral water. Sugar-sweetened soft drinks. Small amounts of orange juice or cranberry juice. Black and green tea. Most dry wines. Coffee. This may not be a complete list of low-FODMAP foods. Talk with your dietitian for more  information. Foods to avoid Grains  Wheat, including kamut, durum, and semolina. Barley and bulgur.  Couscous. Wheat-based cereals. Wheat noodles, bread, crackers, and pastries. Vegetables  Chicory root, artichoke, asparagus, cabbage, snow peas, sugar snap peas, mushrooms, and cauliflower. Onions, garlic, leeks, and the white part of scallions. Fruits  Fresh, dried, and juiced forms of apple, pear, watermelon, peach, plum, cherries, apricots, blackberries, boysenberries, figs, nectarines, and mango. Avocado. Dairy  Milk, yogurt, ice cream, and soft cheese. Cream and sour cream. Milk-based sauces. Custard. Meats and other protein foods  Fried or fatty meat. Sausage. Cashews and pistachios. Soybeans, baked beans, black beans, chickpeas, kidney beans, fava beans, navy beans, lentils, and split peas. Seasoning and other foods  Any sugar-free gum or candy. Foods that contain artificial sweeteners such as sorbitol, mannitol, isomalt, or xylitol. Foods that contain honey, high-fructose corn syrup, or agave. Bouillon, vegetable stock, beef stock, and chicken stock. Garlic and onion powder. Condiments made with onion, such as hummus, chutney, pickles, relish, salad dressing, and salsa. Tomato paste. Beverages  Chicory-based drinks. Coffee substitutes. Chamomile tea. Fennel tea. Sweet or fortified wines such as port or sherry. Diet soft drinks made with isomalt, mannitol, maltitol, sorbitol, or xylitol. Apple, pear, and mango juice. Juices with high-fructose corn syrup. This may not be a complete list of high-FODMAP foods. Talk with your dietitian to discuss what dietary choices are best for you.  Summary  A low-FODMAP eating plan is a short-term diet that eliminates FODMAPs from your diet to help ease symptoms of certain bowel diseases.  The eating plan usually lasts up to 6 weeks. After that, high-FODMAP foods are restarted gradually, one at a time, so you can find out which may be causing symptoms.  A low-FODMAP eating plan can be complicated. It is best to work with a dietitian who has  experience with this type of plan. This information is not intended to replace advice given to you by your health care provider. Make sure you discuss any questions you have with your health care provider. Document Revised: 04/11/2017 Document Reviewed: 12/24/2016 Elsevier Patient Education  [place].

## 2019-07-29 NOTE — Progress Notes (Signed)
Virtual Visit via Video Note  I connected with@ on 07/29/19 at  9:30 AM EDT by a video enabled telemedicine application and verified that I am speaking with the correct person using two identifiers.  Location: Patient:Home Provider: Office Participants: patient and provider  I discussed the limitations of evaluation and management by telemedicine and the availability of in person appointments. I also discussed with the patient that there may be a patient responsible charge related to this service. The patient expressed understanding and agreed to proceed.  CC:est care/painful on right side neck--tonsil--hard to swallow/on and off for 1 yr/otc tea to help releived/  History of Present Illness: Sore Throat  This is a chronic problem. The current episode started more than 1 year ago. The problem has been waxing and waning. There has been no fever. The pain is moderate. Associated symptoms include abdominal pain. Pertinent negatives include no congestion, coughing, diarrhea, drooling, ear discharge, ear pain, headaches, hoarse voice, plugged ear sensation, neck pain, shortness of breath, stridor, swollen glands, trouble swallowing or vomiting. She has had no exposure to strep or mono. She has tried nothing for the symptoms.  last EGD done 02/2019: esophagitis, last appt with GI 05/2019. She discontinued elavil and dexilant 3weeks ago. States sore throat had improved while taking dexilant. She avoids caffeinated drinks, greasy and spicy foods.    reviewed medical history, previous OV notes by GI and EGD procedure report.  Observations/Objective: Physical Exam  Constitutional: She is oriented to person, place, and time.  HENT:  Mouth/Throat: Uvula is midline. No oral lesions. No trismus in the jaw. No uvula swelling. Posterior oropharyngeal erythema present. No oropharyngeal exudate, posterior oropharyngeal edema or tonsillar abscesses.  Pulmonary/Chest: Effort normal. No stridor.   Musculoskeletal:     Cervical back: Neck supple.  Lymphadenopathy:    She has no cervical adenopathy.  Neurological: She is alert and oriented to person, place, and time.    Assessment and Plan: Lydia [name] was seen today for establish care.  Diagnoses and all orders for this visit:  Gastroesophageal reflux disease with esophagitis without hemorrhage   Follow Up Instructions: See avs   I discussed the assessment and treatment plan with the patient. The patient was provided an opportunity to ask questions and all were answered. The patient agreed with the plan and demonstrated an understanding of the instructions.   The patient was advised to call back or seek an in-person evaluation if the symptoms worsen or if the condition fails to improve as anticipated.   Wilfred Lacy, NP

## 2019-08-10 ENCOUNTER — Other Ambulatory Visit: Payer: Self-pay

## 2019-08-11 ENCOUNTER — Encounter: Payer: Self-pay | Admitting: Nurse Practitioner

## 2019-08-11 ENCOUNTER — Ambulatory Visit (INDEPENDENT_AMBULATORY_CARE_PROVIDER_SITE_OTHER): Payer: Medicaid Other | Admitting: Nurse Practitioner

## 2019-08-11 VITALS — BP 112/78 | HR 82 | Temp 96.9°F | Ht 61.0 in | Wt 162.8 lb

## 2019-08-11 DIAGNOSIS — Z13220 Encounter for screening for lipoid disorders: Secondary | ICD-10-CM | POA: Diagnosis not present

## 2019-08-11 DIAGNOSIS — Z0000 Encounter for general adult medical examination without abnormal findings: Secondary | ICD-10-CM

## 2019-08-11 DIAGNOSIS — Z136 Encounter for screening for cardiovascular disorders: Secondary | ICD-10-CM | POA: Diagnosis not present

## 2019-08-11 LAB — LIPID PANEL
Cholesterol: 217 mg/dL — ABNORMAL HIGH (ref 0–200)
HDL: 49.2 mg/dL (ref >39.00)
LDL Cholesterol: 137 mg/dL — ABNORMAL HIGH (ref 0–99)
NonHDL: 167.57
Total CHOL/HDL Ratio: 4
Triglycerides: 154 mg/dL — ABNORMAL HIGH (ref 0.0–149.0)
VLDL: 30.8 mg/dL (ref 0.0–40.0)

## 2019-08-11 LAB — COMPREHENSIVE METABOLIC PANEL
ALT: 7 U/L (ref 0–35)
AST: 13 U/L (ref 0–37)
Albumin: 4 g/dL (ref 3.5–5.2)
Alkaline Phosphatase: 35 U/L — ABNORMAL LOW (ref 39–117)
BUN: 11 mg/dL (ref 6–23)
CO2: 28 mEq/L (ref 19–32)
Calcium: 9.2 mg/dL (ref 8.4–10.5)
Chloride: 103 mEq/L (ref 96–112)
Creatinine, Ser: 0.57 mg/dL (ref 0.40–1.20)
GFR: 122.06 mL/min (ref >60.00)
Glucose, Bld: 86 mg/dL (ref 70–99)
Potassium: 3.8 mEq/L (ref 3.5–5.1)
Sodium: 137 mEq/L (ref 135–145)
Total Bilirubin: 0.5 mg/dL (ref 0.2–1.2)
Total Protein: 6.6 g/dL (ref 6.0–8.3)

## 2019-08-11 LAB — CBC
HCT: 37.7 % (ref 36.0–46.0)
Hemoglobin: 12.7 g/dL (ref 12.0–15.0)
MCHC: 33.6 g/dL (ref 30.0–36.0)
MCV: 89 fl (ref 78.0–100.0)
Platelets: 348 10*3/uL (ref 150.0–400.0)
RBC: 4.24 Mil/uL (ref 3.87–5.11)
RDW: 12.3 % (ref 11.5–15.5)
WBC: 10.9 10*3/uL — ABNORMAL HIGH (ref 4.0–10.5)

## 2019-08-11 LAB — TSH: TSH: 1.64 u[IU]/mL (ref 0.35–4.50)

## 2019-08-11 NOTE — Patient Instructions (Signed)
Thank you for choosing [facility] for your health needs.  Go to lab for blood draw.   Preventive Care 21-33 Years Old, Female Preventive care refers to visits with your health care provider and lifestyle choices that can promote health and wellness. This includes:  A yearly physical exam. This may also be called an annual well check.  Regular dental visits and eye exams.  Immunizations.  Screening for certain conditions.  Healthy lifestyle choices, such as eating a healthy diet, getting regular exercise, not using drugs or products that contain nicotine and tobacco, and limiting alcohol use. What can I expect for my preventive care visit? Physical exam Your health care provider will check your:  Height and weight. This may be used to calculate body mass index (BMI), which tells if you are at a healthy weight.  Heart rate and blood pressure.  Skin for abnormal spots. Counseling Your health care provider may ask you questions about your:  Alcohol, tobacco, and drug use.  Emotional well-being.  Home and relationship well-being.  Sexual activity.  Eating habits.  Work and work Statistician.  Method of birth control.  Menstrual cycle.  Pregnancy history. What immunizations do I need?  Influenza (flu) vaccine  This is recommended every year. Tetanus, diphtheria, and pertussis (Tdap) vaccine  You may need a Td booster every 10 years. Varicella (chickenpox) vaccine  You may need this if you have not been vaccinated. Human papillomavirus (HPV) vaccine  If recommended by your health care provider, you may need three doses over 6 months. Measles, mumps, and rubella (MMR) vaccine  You may need at least one dose of MMR. You may also need a second dose. Meningococcal conjugate (MenACWY) vaccine  One dose is recommended if you are age 47-21 years and a first-year college student living in a residence hall, or if you have one of several medical conditions.  You may also need additional booster doses. Pneumococcal conjugate (PCV13) vaccine  You may need this if you have certain conditions and were not previously vaccinated. Pneumococcal polysaccharide (PPSV23) vaccine  You may need one or two doses if you smoke cigarettes or if you have certain conditions. Hepatitis A vaccine  You may need this if you have certain conditions or if you travel or work in places where you may be exposed to hepatitis A. Hepatitis B vaccine  You may need this if you have certain conditions or if you travel or work in places where you may be exposed to hepatitis B. Haemophilus influenzae type b (Hib) vaccine  You may need this if you have certain conditions. You may receive vaccines as individual doses or as more than one vaccine together in one shot (combination vaccines). Talk with your health care provider about the risks and benefits of combination vaccines. What tests do I need?  Blood tests  Lipid and cholesterol levels. These may be checked every 5 years starting at age 70.  Hepatitis C test.  Hepatitis B test. Screening  Diabetes screening. This is done by checking your blood sugar (glucose) after you have not eaten for a while (fasting).  Sexually transmitted disease (STD) testing.  BRCA-related cancer screening. This may be done if you have a family history of breast, ovarian, tubal, or peritoneal cancers.  Pelvic exam and Pap test. This may be done every 3 years starting at age 13. Starting at age 77, this may be done every 5 years if you have a Pap test in combination with an HPV test.  Talk with your health care provider about your test results, treatment options, and if necessary, the need for more tests. Follow these instructions at home: Eating and drinking   Eat a diet that includes fresh fruits and vegetables, whole grains, lean protein, and low-fat dairy.  Take vitamin and mineral supplements as recommended by your health care  provider.  Do not drink alcohol if: ? Your health care provider tells you not to drink. ? You are pregnant, may be pregnant, or are planning to become pregnant.  If you drink alcohol: ? Limit how much you have to 0-1 drink a day. ? Be aware of how much alcohol is in your drink. In the [place] one drink equals one 12 oz bottle of beer (355 mL), one 5 oz glass of wine (148 mL), or one 1 oz glass of hard liquor (44 mL). Lifestyle  Take daily care of your teeth and gums.  Stay active. Exercise for at least 30 minutes on 5 or more days each week.  Do not use any products that contain nicotine or tobacco, such as cigarettes, e-cigarettes, and chewing tobacco. If you need help quitting, ask your health care provider.  If you are sexually active, practice safe sex. Use a condom or other form of birth control (contraception) in order to prevent pregnancy and STIs (sexually transmitted infections). If you plan to become pregnant, see your health care provider for a preconception visit. What's next?  Visit your health care provider once a year for a well check visit.  Ask your health care provider how often you should have your eyes and teeth checked.  Stay up to date on all vaccines. This information is not intended to replace advice given to you by your health care provider. Make sure you discuss any questions you have with your health care provider. Document Revised: 01/08/2018 Document Reviewed: 01/08/2018 Elsevier Patient Education  2020 [employer].

## 2019-08-11 NOTE — Progress Notes (Signed)
Subjective:    Patient ID: Lydia [name], female    DOB: 03-24-1986, 34 y.o.   MRN: [id number]  Patient presents today for complete physical   HPI  denies any acute complaints.  Sexual History (orientation,birth control, marital status, STD):married, sexually active, use of OCP for contraception, up to date with PAP and breast exam by GYN  Depression/Suicide: Depression screen [facility] 2/9 08/11/2019 07/29/2019 03/07/2015 01/31/2015 01/11/2015  Decreased Interest 0 0 0 0 0  Down, Depressed, Hopeless 0 0 0 0 0  PHQ - 2 Score 0 0 0 0 0   Vision:up to date  Dental:up to date  Immunizations: (TDAP, Hep C screen, Pneumovax, Influenza, zoster)  Health Maintenance  Topic Date Due  . Pap Smear  06/20/2022  . Tetanus Vaccine  08/03/2025  . Flu Shot  Completed  . HIV Screening  Completed   Diet:regular  Weight:  Wt Readings from Last 3 Encounters:  08/11/19 162 lb 12.8 oz (73.8 kg)  06/21/19 154 lb (69.9 kg)  05/31/19 155 lb 8 oz (70.5 kg)    Exercise:none  Fall Risk: Fall Risk  08/11/2019 07/29/2019 03/07/2015 01/31/2015  Falls in the past year? - 0 No No  Number falls in past yr: 0 - - -  Injury with Fall? 0 - - -   Medications and allergies reviewed with patient and updated if appropriate.  Patient Active Problem List   Diagnosis Date Noted  . Acute mastitis 08/08/2015  . NSVD (normal spontaneous vaginal delivery) 08/03/2015  . Indication for care in labor and delivery, antepartum 08/02/2015  . Family history of congenital anomalies   . Dyspareunia, female 03/11/2014  . Acute recurrent maxillary sinusitis 03/11/2014  . Abdominal pain, epigastric 01/26/2014  . Tinea versicolor 12/08/2013  . Pain 12/08/2013  . Unspecified inflammatory disease of female pelvic organs and tissues 12/08/2013  . Pelvic pain in female 07/06/2013  . Dysmenorrhea 10/21/2012  . Unspecified symptom associated with female genital organs 10/07/2012  . Follicular cyst of ovary 10/07/2012  . Other and  unspecified ovarian cyst 10/07/2012  . 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 HEADACHE 11/30/2007    Current Outpatient Medications on File Prior to Visit  Medication Sig Dispense Refill  . amitriptyline (ELAVIL) 50 MG tablet Take 1 tablet (50 mg total) by mouth at bedtime. 90 tablet 3  . celecoxib (CELEBREX) 200 MG capsule     . Cholecalciferol (VITAMIN D-3) 125 MCG (5000 UT) TABS Take 1 tablet by mouth daily. 90 tablet 3  . dexlansoprazole (DEXILANT) 60 MG capsule Take 1 capsule (60 mg total) by mouth daily. 90 capsule 3  . JUNEL 1.5/30 1.5-30 MG-MCG tablet Take one tablet po daily. 1 Package 11  . linaclotide (LINZESS) 145 MCG CAPS capsule Take 1 capsule (145 mcg total) by mouth daily before breakfast. 30 capsule 3   No current facility-administered medications on file prior to visit.    Past Medical History:  Diagnosis Date  . Anxiety   . Chronic headaches    none x 2-3 yrs  . Infection    UTI  . Meningitis   . Ovarian cyst     Past Surgical History:  Procedure Laterality Date  . CHOLECYSTECTOMY  2016  . UPPER GASTROINTESTINAL ENDOSCOPY  2015  . WISDOM TOOTH EXTRACTION      Social History   Socioeconomic History  . Marital status: Married    Spouse name: Not on file  . Number of children: 2  . Years of education: Not on  file  . Highest education level: Not on file  Occupational History  . Not on file  Tobacco Use  . Smoking status: Never Smoker  . Smokeless tobacco: Never Used  Substance and Sexual Activity  . Alcohol use: No    Alcohol/week: 0.0 standard drinks  . Drug use: No  . Sexual activity: Yes    Partners: Male    Birth control/protection: None, Pill  Other Topics Concern  . Not on file  Social History Narrative  . Not on file   Social Determinants of Health   Financial Resource Strain:   . Difficulty of Paying Living Expenses:   Food Insecurity:   . Worried About Charity fundraiser in the Last Year:   . Arboriculturist in the Last Year:     Transportation Needs:   . Film/video editor (Medical):   Marland Kitchen Lack of Transportation (Non-Medical):   Physical Activity:   . Days of Exercise per Week:   . Minutes of Exercise per Session:   Stress:   . Feeling of Stress :   Social Connections:   . Frequency of Communication with Friends and Family:   . Frequency of Social Gatherings with Friends and Family:   . Attends Religious Services:   . Active Member of Clubs or Organizations:   . Attends Archivist Meetings:   Marland Kitchen Marital Status:     Family History  Problem Relation Age of Onset  . Hypertension Mother   . Asthma Father   . Breast cancer Maternal Aunt   . Birth defects Daughter        Limb reduction defect  . Colon cancer Neg Hx   . Esophageal cancer Neg Hx   . Rectal cancer Neg Hx   . Stomach cancer Neg Hx   . Colon polyps Neg Hx        Review of Systems  Constitutional: Negative for fever, malaise/fatigue and weight loss.  HENT: Negative for congestion and sore throat.   Eyes:       Negative for visual changes  Respiratory: Negative for cough and shortness of breath.   Cardiovascular: Negative for chest pain, palpitations and leg swelling.  Gastrointestinal: Negative for blood in stool, constipation, diarrhea and heartburn.  Genitourinary: Negative for dysuria, frequency and urgency.  Musculoskeletal: Negative for falls, joint pain and myalgias.  Skin: Negative for rash.  Neurological: Negative for dizziness, sensory change and headaches.  Endo/Heme/Allergies: Does not bruise/bleed easily.  Psychiatric/Behavioral: Negative for depression, substance abuse and suicidal ideas. The patient is not nervous/anxious.     Objective:   Vitals:   08/11/19 1108  BP: 112/78  Pulse: 82  Temp: (!) 96.9 F (36.1 C)  SpO2: 98%    Body mass index is 30.76 kg/m.   Physical Examination:  Physical Exam Vitals reviewed.  Constitutional:      General: She is not in acute distress.    Appearance: She  is well-developed.  HENT:     Right Ear: Tympanic membrane, ear canal and external ear normal.     Left Ear: Tympanic membrane, ear canal and external ear normal.     Nose: Nose normal.     Mouth/Throat:     Pharynx: No oropharyngeal exudate.  Eyes:     Conjunctiva/sclera: Conjunctivae normal.     Pupils: Pupils are equal, round, and reactive to light.  Cardiovascular:     Rate and Rhythm: Normal rate and regular rhythm.     Pulses: Normal  pulses.     Heart sounds: Normal heart sounds.  Pulmonary:     Effort: Pulmonary effort is normal. No respiratory distress.     Breath sounds: Normal breath sounds.  Chest:     Chest wall: No tenderness.  Abdominal:     General: Bowel sounds are normal. There is no distension.     Palpations: Abdomen is soft.     Tenderness: There is no abdominal tenderness. There is no guarding.  Genitourinary:    Comments: Deferred breast and pelvic exam to GYN per patient Musculoskeletal:        General: Normal range of motion.     Cervical back: Normal range of motion and neck supple.     Right lower leg: No edema.     Left lower leg: No edema.  Lymphadenopathy:     Cervical: No cervical adenopathy.  Skin:    General: Skin is warm and dry.  Neurological:     Mental Status: She is alert and oriented to person, place, and time.     Deep Tendon Reflexes: Reflexes are normal and symmetric.  Psychiatric:        Mood and Affect: Mood normal.        Behavior: Behavior normal.        Thought Content: Thought content normal.     ASSESSMENT and PLAN: This visit occurred during the SARS-CoV-2 public health emergency.  Safety protocols were in place, including screening questions prior to the visit, additional usage of staff PPE, and extensive cleaning of exam room while observing appropriate contact time as indicated for disinfecting solutions.   Lydia [name] was seen today for establish care.  Diagnoses and all orders for this visit:  Preventative health care -      CBC -     Comprehensive metabolic panel -     Lipid panel -     TSH  Encounter for lipid screening for cardiovascular disease -     Lipid panel   No problem-specific Assessment & Plan notes found for this encounter.     Problem List Items Addressed This Visit    None    Visit Diagnoses    Preventative health care    -  Primary   Relevant Orders   CBC   Comprehensive metabolic panel   Lipid panel   TSH   Encounter for lipid screening for cardiovascular disease       Relevant Orders   Lipid panel       Follow up: Return if symptoms worsen or fail to improve.  Wilfred Lacy, NP

## 2019-10-18 ENCOUNTER — Encounter: Payer: Self-pay | Admitting: Nurse Practitioner

## 2019-11-03 ENCOUNTER — Encounter: Payer: Self-pay | Admitting: Obstetrics

## 2019-11-17 ENCOUNTER — Ambulatory Visit (INDEPENDENT_AMBULATORY_CARE_PROVIDER_SITE_OTHER): Payer: Medicaid Other | Admitting: Obstetrics

## 2019-11-17 ENCOUNTER — Encounter: Payer: Self-pay | Admitting: Obstetrics

## 2019-11-17 ENCOUNTER — Other Ambulatory Visit (HOSPITAL_COMMUNITY)
Admission: RE | Admit: 2019-11-17 | Discharge: 2019-11-17 | Disposition: A | Discharge status: Home / Self Care | Payer: Medicaid Other | Source: Ambulatory Visit | Attending: Obstetrics | Admitting: Obstetrics

## 2019-11-17 ENCOUNTER — Other Ambulatory Visit: Payer: Self-pay

## 2019-11-17 VITALS — BP 104/71 | HR 68 | Wt 153.0 lb

## 2019-11-17 DIAGNOSIS — R300 Dysuria: Secondary | ICD-10-CM

## 2019-11-17 DIAGNOSIS — N898 Other specified noninflammatory disorders of vagina: Secondary | ICD-10-CM

## 2019-11-17 LAB — POCT URINALYSIS DIPSTICK
Bilirubin, UA: NEGATIVE
Glucose, UA: NEGATIVE
Ketones, UA: NEGATIVE
Leukocytes, UA: NEGATIVE
Nitrite, UA: NEGATIVE
Protein, UA: NEGATIVE
Spec Grav, UA: >=1.03 — AB (ref 1.010–1.025)
Urobilinogen, UA: 0.2 E.U./dL
pH, UA: 6 (ref 5.0–8.0)

## 2019-11-17 MED ORDER — NITROFURANTOIN MONOHYD MACRO 100 MG PO CAPS: 100.0000 mg | ORAL_CAPSULE | Freq: Two times a day (BID) | ORAL | 2 refills | Status: DC

## 2019-11-17 MED ORDER — TERCONAZOLE 0.8 % VA CREA: 1.0000 | TOPICAL_CREAM | Freq: Every day | VAGINAL | 0 refills | Status: DC

## 2019-11-17 NOTE — Progress Notes (Signed)
Patient ID: Lydia [name], female   DOB: [birth year], 34 y.o.   MRN: [id number]  Chief Complaint  Patient presents with  . uti symptoms    symptoms since weekend     HPI Lydia [name] is a 34 y.o. female.  Complains of burning, frequency and dribbling with urination. HPI  Past Medical History:  Diagnosis Date  . Anxiety   . Chronic headaches    none x 2-3 yrs  . Infection    UTI  . Meningitis   . Ovarian cyst     Past Surgical History:  Procedure Laterality Date  . CHOLECYSTECTOMY  2016  . UPPER GASTROINTESTINAL ENDOSCOPY  2015  . WISDOM TOOTH EXTRACTION      Family History  Problem Relation Age of Onset  . Hypertension Mother   . Asthma Father   . Breast cancer Maternal Aunt 42  . Birth defects Daughter        Limb reduction defect  . Colon cancer Neg Hx   . Esophageal cancer Neg Hx   . Rectal cancer Neg Hx   . Stomach cancer Neg Hx   . Colon polyps Neg Hx     Social History Social History   Tobacco Use  . Smoking status: Never Smoker  . Smokeless tobacco: Never Used  Vaping Use  . Vaping Use: Never used  Substance Use Topics  . Alcohol use: No    Alcohol/week: 0.0 standard drinks  . Drug use: No    Allergies  Allergen Reactions  . Penicillins Hives    Has patient had a PCN reaction causing immediate rash, facial/tongue/throat swelling, SOB or lightheadedness with hypotension: Yes Has patient had a PCN reaction causing severe rash involving mucus membranes or skin necrosis: Yes Has patient had a PCN reaction that required hospitalization no Has patient had a PCN reaction occurring within the last 10 years: No If all of the above answers are "NO", then may proceed with Cephalosporin use.    Current Outpatient Medications  Medication Sig Dispense Refill  . JUNEL 1.5/30 1.5-30 MG-MCG tablet Take one tablet po daily. 1 Package 11  . amitriptyline (ELAVIL) 50 MG tablet Take 1 tablet (50 mg total) by mouth at bedtime. 90 tablet 3  . celecoxib (CELEBREX)  200 MG capsule     . Cholecalciferol (VITAMIN D-3) 125 MCG (5000 UT) TABS Take 1 tablet by mouth daily. 90 tablet 3  . dexlansoprazole (DEXILANT) 60 MG capsule Take 1 capsule (60 mg total) by mouth daily. 90 capsule 3  . linaclotide (LINZESS) 145 MCG CAPS capsule Take 1 capsule (145 mcg total) by mouth daily before breakfast. 30 capsule 3  . nitrofurantoin, macrocrystal-monohydrate, (MACROBID) 100 MG capsule Take 1 capsule (100 mg total) by mouth 2 (two) times daily. 1 po BID x 7days 14 capsule 2  . terconazole (TERAZOL 3) 0.8 % vaginal cream Place 1 applicator vaginally at bedtime. 20 g 0   No current facility-administered medications for this visit.    Review of Systems Review of Systems Constitutional: negative for fatigue and weight loss Respiratory: negative for cough and wheezing Cardiovascular: negative for chest pain, fatigue and palpitations Gastrointestinal: negative for abdominal pain and change in bowel habits Genitourinary:positive for burning, frequency and dribbling with urination Integument/breast: negative for nipple discharge Musculoskeletal:negative for myalgias Neurological: negative for gait problems and tremors Behavioral/Psych: negative for abusive relationship, depression Endocrine: negative for temperature intolerance      Blood pressure 104/71, pulse 68, weight 153 lb (69.4 kg).  Physical Exam Physical Exam: Deferred General:   alert and no distress   50% of 15 min visit spent on counseling and coordination of care.   Data Reviewed Urinalysis  Assessment     1. Dysuria Rx: - POCT urinalysis dipstick - Urine Culture - nitrofurantoin, macrocrystal-monohydrate, (MACROBID) 100 MG capsule; Take 1 capsule (100 mg total) by mouth 2 (two) times daily. 1 po BID x 7days  Dispense: 14 capsule; Refill: 2  2. Vaginal discharge Rx: - Cervicovaginal ancillary only( )  3. Vaginal itching Rx: - terconazole (TERAZOL 3) 0.8 % vaginal cream; Place 1  applicator vaginally at bedtime.  Dispense: 20 g; Refill: 0    Plan  Follow up prn   Orders Placed This Encounter  Procedures  . Urine Culture  . POCT urinalysis dipstick   Meds ordered this encounter  Medications  . nitrofurantoin, macrocrystal-monohydrate, (MACROBID) 100 MG capsule    Sig: Take 1 capsule (100 mg total) by mouth 2 (two) times daily. 1 po BID x 7days    Dispense:  14 capsule    Refill:  2  . terconazole (TERAZOL 3) 0.8 % vaginal cream    Sig: Place 1 applicator vaginally at bedtime.    Dispense:  20 g    Refill:  0     Shelly Bombard, MD 11/17/2019 3:11 PM

## 2019-11-17 NOTE — Progress Notes (Signed)
Pt states UTI symptoms since the weekend.  Pt states some mild vaginal itching.

## 2019-11-18 LAB — CERVICOVAGINAL ANCILLARY ONLY
Bacterial Vaginitis (gardnerella): NEGATIVE
Candida Glabrata: NEGATIVE
Candida Vaginitis: NEGATIVE
Comment: NEGATIVE
Comment: NEGATIVE
Comment: NEGATIVE
Comment: NEGATIVE
Trichomonas: NEGATIVE

## 2019-11-19 LAB — URINE CULTURE

## 2020-01-19 ENCOUNTER — Ambulatory Visit: Payer: Medicaid Other | Admitting: Psychology

## 2020-01-19 NOTE — Telephone Encounter (Signed)
Let patient know I got her message  Would rec she try Linzess 290 mcg (dose increase) before changing to a different laxative

## 2020-01-26 NOTE — Telephone Encounter (Signed)
Linzess has been quite effective through the years but now not even at maximum dose Given further change in bowel habit I would recommend that she have a colonoscopy to ensure no other cause for change in bowel habit  In the interim okay to change Linzess to Amitiza 24 mcg twice daily.  Amitiza is best taken with food  She should let me know if this is not helpful

## 2020-01-27 ENCOUNTER — Telehealth: Payer: Self-pay | Admitting: Internal Medicine

## 2020-01-27 ENCOUNTER — Encounter: Payer: Self-pay | Admitting: Internal Medicine

## 2020-01-27 ENCOUNTER — Other Ambulatory Visit: Payer: Self-pay

## 2020-01-27 MED ORDER — LUBIPROSTONE 24 MCG PO CAPS
24.0000 ug | ORAL_CAPSULE | Freq: Two times a day (BID) | ORAL | 3 refills | Status: DC
Start: 2020-01-27 — End: 2020-06-09

## 2020-01-27 NOTE — Telephone Encounter (Signed)
Pharmacy has the script and it is active, needs a PA.

## 2020-01-27 NOTE — Telephone Encounter (Signed)
Prior Lydia [name] has been requested through [place] (Medicaid). Patient hs tried and failed Linzess 72 mcg, Linzess 145 mcg, colace, miralax, metamucil, bisacodyl in the past for her chronic constipation.  We will await insurance response.

## 2020-01-27 NOTE — Telephone Encounter (Signed)
Pt states her medication for AMITIZA has been canceled per her pharmacy, pt would like to know the reason for this.

## 2020-02-03 NOTE — Telephone Encounter (Signed)
Per patient's pharmacy, Amitiza 24 mcg script did go through at $3.00. Patient has been advised of this information.

## 2020-02-16 ENCOUNTER — Other Ambulatory Visit: Payer: Self-pay

## 2020-02-16 ENCOUNTER — Ambulatory Visit (AMBULATORY_SURGERY_CENTER): Payer: Self-pay | Admitting: *Deleted

## 2020-02-16 VITALS — Ht <= 58 in | Wt 156.0 lb

## 2020-02-16 DIAGNOSIS — R194 Change in bowel habit: Secondary | ICD-10-CM

## 2020-02-16 MED ORDER — NA SULFATE-K SULFATE-MG SULF 17.5-3.13-1.6 GM/177ML PO SOLN
ORAL | 0 refills | Status: DC
Start: 2020-02-16 — End: 2020-03-24

## 2020-02-16 NOTE — Progress Notes (Signed)
Patient is here in-person for PV. Patient denies any allergies to eggs or soy. Patient denies any problems with anesthesia/sedation. Patient denies any oxygen use at home. Patient denies taking any diet/weight loss medications or blood thinners. Patient is not being treated for MRSA or C-diff. Patient is aware of our care-partner policy and PNSQZ-83 safety protocol. EMMI education assisgned to the patient for the procedure, sent to MyChart.   COVID-19 vaccines completed on 09/07/19, per patient.   2 day prep given to pt due to constipation. She will continue to take Amitiza.

## 2020-03-24 ENCOUNTER — Ambulatory Visit (AMBULATORY_SURGERY_CENTER): Payer: Medicaid Other | Admitting: Internal Medicine

## 2020-03-24 ENCOUNTER — Other Ambulatory Visit: Payer: Self-pay

## 2020-03-24 ENCOUNTER — Encounter: Payer: Self-pay | Admitting: Internal Medicine

## 2020-03-24 VITALS — BP 93/67 | HR 77 | Temp 97.1°F | Resp 10 | Ht <= 58 in | Wt 156.0 lb

## 2020-03-24 DIAGNOSIS — K5000 Crohn's disease of small intestine without complications: Secondary | ICD-10-CM | POA: Diagnosis not present

## 2020-03-24 DIAGNOSIS — K5289 Other specified noninfective gastroenteritis and colitis: Secondary | ICD-10-CM | POA: Diagnosis not present

## 2020-03-24 DIAGNOSIS — R194 Change in bowel habit: Secondary | ICD-10-CM

## 2020-03-24 MED ORDER — SODIUM CHLORIDE 0.9 % IV SOLN: 500.0000 mL | Freq: Once | INTRAVENOUS | Status: DC

## 2020-03-24 NOTE — Progress Notes (Signed)
pt tolerated well. VSS. awake and to recovery. Report given to RN.  

## 2020-03-24 NOTE — Op Note (Signed)
[place] Patient Name: Lydia [name] Procedure Date: 03/24/2020 2:32 PM MRN: [id number] Endoscopist: Jerene Bears , MD Age: 34 Referring MD:  Date of Birth: [birth year] Gender: Female Account #: [id number] Procedure:                Colonoscopy Indications:              Change in bowel habits, Constipation with loss of                            response to Linzess 290 mcg daily now on Amitiza 24                            mcg twice daily Medicines:                Monitored Anesthesia Care Procedure:                Pre-Anesthesia Assessment:                           - Prior to the procedure, a History and Physical                            was performed, and patient medications and                            allergies were reviewed. The patient's tolerance of                            previous anesthesia was also reviewed. The risks                            and benefits of the procedure and the sedation                            options and risks were discussed with the patient.                            All questions were answered, and informed consent                            was obtained. Prior Anticoagulants: The patient has                            taken no previous anticoagulant or antiplatelet                            agents. ASA Grade Assessment: II - A patient with                            mild systemic disease. After reviewing the risks                            and benefits, the patient was deemed in  satisfactory condition to undergo the procedure.                           After obtaining informed consent, the colonoscope                            was passed under direct vision. Throughout the                            procedure, the patient's blood pressure, pulse, and                            oxygen saturations were monitored continuously. The                            Colonoscope was introduced through the anus  and                            advanced to the cecum, identified by appendiceal                            orifice and ileocecal valve. The colonoscopy was                            performed without difficulty. The patient tolerated                            the procedure well. The quality of the bowel                            preparation was excellent. The terminal ileum,                            ileocecal valve, appendiceal orifice, and rectum                            were photographed. Scope In: 2:36:54 PM Scope Out: 2:52:36 PM Scope Withdrawal Time: 0 hours 8 minutes 37 seconds  Total Procedure Duration: 0 hours 15 minutes 42 seconds  Findings:                 The digital rectal exam was normal.                           Inflammation, mild in severity and characterized by                            erythema and shallow ulcerations was found in the                            terminal ileum. Biopsies were taken with a cold                            forceps for histology.  The lumen of the recto-sigmoid colon, sigmoid                            colon, descending colon and transverse colon was                            moderately dilated and capacious.                           Normal mucosa was found in the entire colon.                           The retroflexed view of the distal rectum and anal                            verge was normal and showed no anal or rectal                            abnormalities. Complications:            No immediate complications. Estimated Blood Loss:     Estimated blood loss was minimal. Impression:               - Ileitis, rule out Crohn's disease. Biopsied.                           - Capacious colon, particularly in the left colon.                            Likely in part explaining constipation (versus the                            result of)                           - Normal mucosa in the entire examined  colon. Recommendation:           - Patient has a contact number available for                            emergencies. The signs and symptoms of potential                            delayed complications were discussed with the                            patient. Return to normal activities tomorrow.                            Written discharge instructions were provided to the                            patient.                           - Resume previous diet.                           -  Continue present medications.                           - Await pathology results.                           - Repeat colonoscopy for screening purposes at age                            45.                           - Return to GI clinic in Jan 2022. Jerene Bears, MD 03/24/2020 3:01:04 PM This report has been signed electronically.

## 2020-03-24 NOTE — Patient Instructions (Signed)
YOU HAD AN ENDOSCOPIC PROCEDURE TODAY AT THE [facility]:   Refer to the procedure report that was given to you for any specific questions about what was found during the examination.  If the procedure report does not answer your questions, please call your gastroenterologist to clarify.  If you requested that your care partner not be given the details of your procedure findings, then the procedure report has been included in a sealed envelope for you to review at your convenience later.  YOU SHOULD EXPECT: Some feelings of bloating in the abdomen. Passage of more gas than usual.  Walking can help get rid of the air that was put into your GI tract during the procedure and reduce the bloating. If you had a lower endoscopy (such as a colonoscopy or flexible sigmoidoscopy) you may notice spotting of blood in your stool or on the toilet paper. If you underwent a bowel prep for your procedure, you may not have a normal bowel movement for a few days.  Please Note:  You might notice some irritation and congestion in your nose or some drainage.  This is from the oxygen used during your procedure.  There is no need for concern and it should clear up in a day or so.  SYMPTOMS TO REPORT IMMEDIATELY:   Following lower endoscopy (colonoscopy or flexible sigmoidoscopy):  Excessive amounts of blood in the stool  Significant tenderness or worsening of abdominal pains  Swelling of the abdomen that is new, acute  Fever of 100F or higher  For urgent or emergent issues, a gastroenterologist can be reached at any hour by calling [phone]. Do not use MyChart messaging for urgent concerns.    DIET:  We do recommend a small meal at first, but then you may proceed to your regular diet.  Drink plenty of fluids but you should avoid alcoholic beverages for 24 hours.  ACTIVITY:  You should plan to take it easy for the rest of today and you should NOT DRIVE or use heavy machinery until tomorrow (because  of the sedation medicines used during the test).    FOLLOW UP: Our staff will call the number listed on your records 48-72 hours following your procedure to check on you and address any questions or concerns that you may have regarding the information given to you following your procedure. If we do not reach you, we will leave a message.  We will attempt to reach you two times.  During this call, we will ask if you have developed any symptoms of COVID 19. If you develop any symptoms (ie: fever, flu-like symptoms, shortness of breath, cough etc.) before then, please call [phone].  If you test positive for Covid 19 in the 2 weeks post procedure, please call and report this information to us.    If any biopsies were taken you will be contacted by phone or by letter within the next 1-3 weeks.  Please call us at [phone] if you have not heard about the biopsies in 3 weeks.    SIGNATURES/CONFIDENTIALITY: You and/or your care partner have signed paperwork which will be entered into your electronic medical record.  These signatures attest to the fact that that the information above on your After Visit Summary has been reviewed and is understood.  Full responsibility of the confidentiality of this discharge information lies with you and/or your care-partner. 

## 2020-03-24 NOTE — Progress Notes (Signed)
Called to room to assist during endoscopic procedure.  Patient ID and intended procedure confirmed with present staff. Received instructions for my participation in the procedure from the performing physician.  

## 2020-03-24 NOTE — Progress Notes (Signed)
Pt's states no medical or surgical changes since previsit or office visit.   VS taken by CW 

## 2020-03-28 ENCOUNTER — Telehealth: Payer: Self-pay

## 2020-03-28 NOTE — Telephone Encounter (Signed)
Called #, left message regarding follow up of procedure. LN 

## 2020-03-28 NOTE — Telephone Encounter (Signed)
Left message on answering machine. 

## 2020-05-31 ENCOUNTER — Encounter: Payer: Self-pay | Admitting: *Deleted

## 2020-06-05 ENCOUNTER — Encounter: Payer: Self-pay | Admitting: Internal Medicine

## 2020-06-05 ENCOUNTER — Ambulatory Visit (INDEPENDENT_AMBULATORY_CARE_PROVIDER_SITE_OTHER): Payer: Medicaid Other | Admitting: Internal Medicine

## 2020-06-05 VITALS — BP 102/72 | HR 88 | Ht 59.5 in | Wt 162.5 lb

## 2020-06-05 DIAGNOSIS — K5909 Other constipation: Secondary | ICD-10-CM | POA: Diagnosis not present

## 2020-06-05 DIAGNOSIS — Q438 Other specified congenital malformations of intestine: Secondary | ICD-10-CM | POA: Diagnosis not present

## 2020-06-05 DIAGNOSIS — K529 Noninfective gastroenteritis and colitis, unspecified: Secondary | ICD-10-CM

## 2020-06-05 NOTE — Progress Notes (Signed)
   Subjective:    Patient ID: Lydia [name], female    DOB: Oct 02, 1985, 35 y.o.   MRN: [id number]  HPI Lydia [name] is a 35 year old female with a past medical history of chronic constipation, dyspepsia who presents for follow-up.  She was last seen on 03/24/2020 at the time of her colonoscopy.  Colonoscopy on 03/24/2020 was performed to evaluate constipation and change in bowel habit.  This revealed ulcerations scattered in the terminal ileum.  The left colon was rather dilated but normal in appearance throughout the colon.  Biopsies from the terminal ileum showed focal active colitis without specific chronicity.  She reports that constipation continues to be an issue for her.  Linzess lost efficacy that she has been taking Amitiza 24 mcg twice a day.  Even when taking this she only has a bowel movement about every 4 days.  Without medication it can be longer than 7 days.  With this she has significant abdominal bloating and fullness with discomfort.  She feels that Amitiza works better if she skips several days and does not take it and then usually with the initial dose she will have a bowel movement.  She was taking Advil for cervical neck and joint pain but has stopped this since the ileitis was discovered at colonoscopy.  Review of Systems As per HPI, otherwise negative  Current Medications, Allergies, Past Medical History, Past Surgical History, Family History and Social History were reviewed in [employer] record.     Objective:   Physical Exam Ht 4' 11.5" (1.511 m) Comment: height measured without shoes  BMI 30.98 kg/m  Gen: awake, alert, NAD HEENT: anicteric Neuro: nonfocal      Assessment & Plan:  35 year old female with a past medical history of chronic constipation, dyspepsia who presents for follow-up.   1.  Chronic constipation with redundant colon --some of her constipation is anatomic which we discussed today.  Neither Linzess or Amitiza have  consistently provided good results.  For this reason we will discontinue Amitiza and try Motegrity --Discontinue Amitiza --Try Motegrity 2 mg daily --She is asked to call me if Motegrity does not work or if she has side effects  2.  Acute ileitis --we discussed how without chronic changes this is unlikely to be IBD/Crohn's disease.  This is very likely secondary to her previous use of NSAIDs which she has stopped.  She is not having symptoms of ileitis currently.  3.  Dyspepsia --previously an issue and no longer troublesome.  She will remain off of PPI for now.

## 2020-06-05 NOTE — Patient Instructions (Addendum)
We have sent the following medications to your pharmacy for you to pick up at your convenience: Motegrity 2 mg daily  Discontinue Amitiza.  Please send a mychart message or give [place] a call in 4 weeks to let us know how you are doing.  If you are age 34 or older, your body mass index should be between 23-30. Your Body mass index is 32.27 kg/m. If this is out of the aforementioned range listed, please consider follow up with your Primary Care Provider.  If you are age 48 or younger, your body mass index should be between 19-25. Your Body mass index is 32.27 kg/m. If this is out of the aformentioned range listed, please consider follow up with your Primary Care Provider.   Due to recent changes in healthcare laws, you may see the results of your imaging and laboratory studies on MyChart before your provider has had a chance to review them.  We understand that in some cases there may be results that are confusing or concerning to you. Not all laboratory results come back in the same time frame and the provider may be waiting for multiple results in order to interpret others.  Please give [place] 48 hours in order for your provider to thoroughly review all the results before contacting the office for clarification of your results.

## 2020-06-09 MED ORDER — MOTEGRITY 2 MG PO TABS: 1.0000 | ORAL_TABLET | Freq: Every day | ORAL | 2 refills | Status: DC

## 2020-06-26 NOTE — Telephone Encounter (Signed)
Please let patient know that I got her message and I am very glad the Motegrity is working well  Please ensure she has her prescription for Motegrity 2 mg daily and she can follow-up in the office in 6-9 months

## 2020-06-27 MED ORDER — MOTEGRITY 2 MG PO TABS
1.0000 | ORAL_TABLET | Freq: Every day | ORAL | 3 refills | Status: DC
Start: 2020-06-27 — End: 2021-02-14

## 2020-06-28 ENCOUNTER — Ambulatory Visit: Payer: Medicaid Other | Admitting: Obstetrics

## 2020-06-29 ENCOUNTER — Other Ambulatory Visit: Payer: Self-pay

## 2020-06-29 ENCOUNTER — Encounter: Payer: Self-pay | Admitting: Obstetrics

## 2020-06-29 ENCOUNTER — Other Ambulatory Visit (HOSPITAL_COMMUNITY)
Admission: RE | Admit: 2020-06-29 | Discharge: 2020-06-29 | Disposition: A | Discharge status: Home / Self Care | Payer: Medicaid Other | Source: Ambulatory Visit | Attending: Obstetrics | Admitting: Obstetrics

## 2020-06-29 ENCOUNTER — Ambulatory Visit (INDEPENDENT_AMBULATORY_CARE_PROVIDER_SITE_OTHER): Payer: Medicaid Other | Admitting: Obstetrics

## 2020-06-29 VITALS — BP 109/75 | HR 73 | Wt 161.0 lb

## 2020-06-29 DIAGNOSIS — Z01419 Encounter for gynecological examination (general) (routine) without abnormal findings: Secondary | ICD-10-CM

## 2020-06-29 DIAGNOSIS — Z3041 Encounter for surveillance of contraceptive pills: Secondary | ICD-10-CM | POA: Diagnosis not present

## 2020-06-29 DIAGNOSIS — N946 Dysmenorrhea, unspecified: Secondary | ICD-10-CM | POA: Diagnosis not present

## 2020-06-29 MED ORDER — JUNEL 1.5/30 1.5-30 MG-MCG PO TABS: ORAL_TABLET | ORAL | 11 refills | Status: DC

## 2020-06-29 MED ORDER — IBUPROFEN 800 MG PO TABS: 800.0000 mg | ORAL_TABLET | Freq: Three times a day (TID) | ORAL | 5 refills | Status: DC | PRN

## 2020-06-29 NOTE — Progress Notes (Signed)
Subjective:        Lydia [name] is a 35 y.o. female here for a routine exam.  Current complaints: None.    Personal health questionnaire:  Is [name], have a family history of breast and/or ovarian cancer: yes Is there a family history of uterine cancer diagnosed at age < 54, gastrointestinal cancer, urinary tract cancer, family member who is a Field seismologist syndrome-associated carrier: no Is the patient overweight and hypertensive, family history of diabetes, personal history of gestational diabetes, preeclampsia or PCOS: no Is patient over 73, have PCOS,  family history of premature CHD under age 37, diabetes, smoke, have hypertension or peripheral artery disease:  no At any time, has a partner hit, kicked or otherwise hurt or frightened you?: no Over the past 2 weeks, have you felt down, depressed or hopeless?: no Over the past 2 weeks, have you felt little interest or pleasure in doing things?:no   Gynecologic History No LMP recorded. Contraception: OCP (estrogen/progesterone) Last Pap: 06-21-2019. Results were: normal Last mammogram: n/a. Results were: n/a  Obstetric History OB History  Gravida Para Term Preterm AB Living  4 3 3  0 1 3  SAB IAB Ectopic Multiple Live Births  1 0 0 0 3    # Outcome Date GA Lbr Len/2nd Weight Sex Delivery Anes PTL Lv  4 Term 08/03/15 [redacted]w[redacted]d 20:25 / 00:40 8 lb 1.5 oz (3.671 kg) M Vag-Spont EPI  LIV  3 Term 12/30/10 [redacted]w[redacted]d 10:50 / 00:29 6 lb 1.4 oz (2.761 kg) F Vag-Spont EPI  LIV     Birth Comments: left arm below elbow missing  2 Term 11/28/04 [redacted]w[redacted]d  6 lb 14 oz (3.118 kg) F Vag-Spont EPI  LIV  1 SAB             Past Medical History:  Diagnosis Date  . Anxiety   . Chronic headaches    none x 2-3 yrs  . Ileitis   . Infection    UTI  . Meningitis   . Ovarian cyst     Past Surgical History:  Procedure Laterality Date  . CHOLECYSTECTOMY  2016  . UPPER GASTROINTESTINAL ENDOSCOPY  2015  . WISDOM TOOTH EXTRACTION        Current Outpatient Medications:  .  ibuprofen (ADVIL) 800 MG tablet, Take 1 tablet (800 mg total) by mouth every 8 (eight) hours as needed., Disp: 30 tablet, Rfl: 5 .  Prucalopride Succinate (MOTEGRITY) 2 MG TABS, Take 1 tablet (2 mg total) by mouth daily., Disp: 30 tablet, Rfl: 3 .  hydrOXYzine (VISTARIL) 25 MG capsule, Take 25 mg by mouth 2 (two) times daily as needed., Disp: , Rfl:  .  JUNEL 1.5/30 1.5-30 MG-MCG tablet, Take one tablet po daily., Disp: 28 tablet, Rfl: 11 Allergies  Allergen Reactions  . Penicillins Hives    Has patient had a PCN reaction causing immediate rash, facial/tongue/throat swelling, SOB or lightheadedness with hypotension: Yes Has patient had a PCN reaction causing severe rash involving mucus membranes or skin necrosis: Yes Has patient had a PCN reaction that required hospitalization no Has patient had a PCN reaction occurring within the last 10 years: No If all of the above answers are "NO", then may proceed with Cephalosporin use.    Social History   Tobacco Use  . Smoking status: Never Smoker  . Smokeless tobacco: Never Used  Substance Use Topics  . Alcohol use: No    Alcohol/week: 0.0 standard drinks    Family History  Problem Relation Age of Onset  . Hypertension Mother   . Asthma Father   . Breast cancer Maternal Aunt 42  . Birth defects Daughter        Limb reduction defect  . Colon cancer Neg Hx   . Esophageal cancer Neg Hx   . Rectal cancer Neg Hx   . Stomach cancer Neg Hx   . Colon polyps Neg Hx       Review of Systems  Constitutional: negative for fatigue and weight loss Respiratory: negative for cough and wheezing Cardiovascular: negative for chest pain, fatigue and palpitations Gastrointestinal: negative for abdominal pain and change in bowel habits Musculoskeletal:negative for myalgias Neurological: negative for gait problems and tremors Behavioral/Psych: negative for abusive relationship, depression Endocrine: negative for  temperature intolerance    Genitourinary:negative for abnormal menstrual periods, genital lesions, hot flashes, sexual problems and vaginal discharge Integument/breast: negative for breast lump, breast tenderness, nipple discharge and skin lesion(s)    Objective:       BP 109/75   Pulse 73   Wt 161 lb (73 kg)   BMI 31.97 kg/m  General:   alert and no distress  Skin:   no rash or abnormalities  Lungs:   clear to auscultation bilaterally  Heart:   regular rate and rhythm, S1, S2 normal, no murmur, click, rub or gallop  Breasts:   normal without suspicious masses, skin or nipple changes or axillary nodes  Abdomen:  normal findings: no organomegaly, soft, non-tender and no hernia  Pelvis:  External genitalia: normal general appearance Urinary system: urethral meatus normal and bladder without fullness, nontender Vaginal: normal without tenderness, induration or masses Cervix: normal appearance Adnexa: normal bimanual exam Uterus: anteverted and non-tender, normal size   Lab Review Urine pregnancy test Labs reviewed yes Radiologic studies reviewed no  50% of 20 min visit spent on counseling and coordination of care.   Assessment:     1. Encounter for gynecological examination with Papanicolaou smear of cervix Rx: - Cytology - PAP( [facility])  2. Dysmenorrhea Rx: - ibuprofen (ADVIL) 800 MG tablet; Take 1 tablet (800 mg total) by mouth every 8 (eight) hours as needed.  Dispense: 30 tablet; Refill: 5  3. Encounter for surveillance of contraceptive pills Rx: - JUNEL 1.5/30 1.5-30 MG-MCG tablet; Take one tablet po daily.  Dispense: 28 tablet; Refill: 11    Plan:    Education reviewed: calcium supplements, depression evaluation, low fat, low cholesterol diet, safe sex/STD prevention, self breast exams and weight bearing exercise. Contraception: OCP (estrogen/progesterone). Follow up in: 1 year.   Meds ordered this encounter  Medications  . ibuprofen (ADVIL) 800 MG  tablet    Sig: Take 1 tablet (800 mg total) by mouth every 8 (eight) hours as needed.    Dispense:  30 tablet    Refill:  5  . JUNEL 1.5/30 1.5-30 MG-MCG tablet    Sig: Take one tablet po daily.    Dispense:  28 tablet    Refill:  11     Shelly Bombard, MD 06/29/2020 12:10 PM

## 2020-07-03 LAB — CYTOLOGY - PAP
Comment: NEGATIVE
Diagnosis: NEGATIVE
High risk HPV: NEGATIVE

## 2020-08-01 ENCOUNTER — Ambulatory Visit: Payer: Medicaid Other | Admitting: Physician Assistant

## 2020-08-01 ENCOUNTER — Other Ambulatory Visit: Payer: Self-pay

## 2020-08-01 ENCOUNTER — Ambulatory Visit: Payer: Medicaid Other | Admitting: Gastroenterology

## 2020-08-01 ENCOUNTER — Encounter: Payer: Self-pay | Admitting: Physician Assistant

## 2020-08-01 ENCOUNTER — Other Ambulatory Visit (INDEPENDENT_AMBULATORY_CARE_PROVIDER_SITE_OTHER): Payer: Medicaid Other

## 2020-08-01 VITALS — BP 108/64 | HR 66 | Ht 59.0 in | Wt 160.0 lb

## 2020-08-01 DIAGNOSIS — R1011 Right upper quadrant pain: Secondary | ICD-10-CM

## 2020-08-01 DIAGNOSIS — R110 Nausea: Secondary | ICD-10-CM

## 2020-08-01 LAB — COMPREHENSIVE METABOLIC PANEL
ALT: 7 U/L (ref 0–35)
AST: 11 U/L (ref 0–37)
Albumin: 4.2 g/dL (ref 3.5–5.2)
Alkaline Phosphatase: 36 U/L — ABNORMAL LOW (ref 39–117)
BUN: 9 mg/dL (ref 6–23)
CO2: 29 mEq/L (ref 19–32)
Calcium: 9.3 mg/dL (ref 8.4–10.5)
Chloride: 104 mEq/L (ref 96–112)
Creatinine, Ser: 0.6 mg/dL (ref 0.40–1.20)
GFR: 117.37 mL/min (ref >60.00)
Glucose, Bld: 102 mg/dL — ABNORMAL HIGH (ref 70–99)
Potassium: 3.8 mEq/L (ref 3.5–5.1)
Sodium: 139 mEq/L (ref 135–145)
Total Bilirubin: 0.3 mg/dL (ref 0.2–1.2)
Total Protein: 6.8 g/dL (ref 6.0–8.3)

## 2020-08-01 LAB — LIPASE: Lipase: 48 U/L (ref 11.0–59.0)

## 2020-08-01 LAB — CBC WITH DIFFERENTIAL/PLATELET
Basophils Absolute: 0 10*3/uL (ref 0.0–0.1)
Basophils Relative: 0.4 % (ref 0.0–3.0)
Eosinophils Absolute: 0.1 10*3/uL (ref 0.0–0.7)
Eosinophils Relative: 1.3 % (ref 0.0–5.0)
HCT: 36.9 % (ref 36.0–46.0)
Hemoglobin: 12.7 g/dL (ref 12.0–15.0)
Lymphocytes Relative: 33.5 % (ref 12.0–46.0)
Lymphs Abs: 3.6 10*3/uL (ref 0.7–4.0)
MCHC: 34.5 g/dL (ref 30.0–36.0)
MCV: 86.7 fl (ref 78.0–100.0)
Monocytes Absolute: 0.4 10*3/uL (ref 0.1–1.0)
Monocytes Relative: 3.4 % (ref 3.0–12.0)
Neutro Abs: 6.6 10*3/uL (ref 1.4–7.7)
Neutrophils Relative %: 61.4 % (ref 43.0–77.0)
Platelets: 368 10*3/uL (ref 150.0–400.0)
RBC: 4.26 Mil/uL (ref 3.87–5.11)
RDW: 12.7 % (ref 11.5–15.5)
WBC: 10.7 10*3/uL — ABNORMAL HIGH (ref 4.0–10.5)

## 2020-08-01 MED ORDER — ONDANSETRON HCL 4 MG PO TABS: 4.0000 mg | ORAL_TABLET | Freq: Four times a day (QID) | ORAL | 1 refills | Status: DC | PRN

## 2020-08-01 NOTE — Addendum Note (Signed)
Addended by: Darrall Dears on: 08/01/2020 02:06 PM   Modules accepted: Orders

## 2020-08-01 NOTE — Progress Notes (Signed)
Chief Complaint: Abdominal pain  HPI:    Lydia [name] is a 35 year old female with past medical history as listed below, assigned to Dr. Dr. Hilarie Fredrickson, who presents clinic today with a complaint of abdominal pain.    01/05/2019 patient saw Dr. Hilarie Fredrickson and described bloating and epigastric discomfort.  She was on Pantoprazole twice a day and it was not helping.  It was recommended she have an EGD.  She was also continued on Linzess 145 mcg 2 days on and off 1 day.  For chronic constipation.    03/12/2019 EGD with mild reflux esophagitis.  Pantoprazole stopped.  Prescribed Dexilant 60 mg daily.  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      05/31/2019 patient was seen in clinic by me for follow-up of dyspepsia and epigastric pain.  At that time was doing well, no further epigastric discomfort.  Very occasionally she would have a little bit of stomach upset but only lasted for short time.  She was happy with her current medications including Dexilant 60 mg daily and amitriptyline 50 mg nightly as well as every other day Linzess.  She was continued on Linzess 145 mcg 1 tablet 2 days in a row then skip a day as well as Dexilant 60 mg daily and amitriptyline 50 mg nightly.    03/24/2020 colonoscopy Dr.Pyrtle with ileitis, rule out Crohn's disease, capacious colon, particularly in the left colon likely in part explain constipation (versus result of).  Otherwise normal.  Repeat recommended 45.  Biopsies from her small bowel showed active inflammation but no chronic inflammation, this was nonspecific but did not appear to be classic for Crohn's disease.  It was thought the possibly NSAIDs caused this.  Recommend she avoid these.    Today, the patient tells me that she has a chronic right upper quadrant pain which typically comes and goes within a few hours, it will typically happen 1-2 times a month and when it does she takes a Tylenol and puts a pillow on that side and lays down and within a  few hours she is better, this occurred last Tuesday but then reoccurred on Thursday, 17/22 and at that time she did her regular routine but the pain continued throughout the day.  She describes this pain as sharp "like someone stabbing me", and rates it as a 7-8/10.  It sometimes radiates down into her right lower quadrant.  Tells me this has been occurring for years.  She had her gallbladder out in 2016 and she believes it has been happening ever since then.  Interestingly over the past month she has had an increase in nausea telling me that it will come and go and she is not certain it is worse with the pain but this has been troublesome for her, typically comes on shortly after eating.  No vomiting.    Constipation is controlled at the moment on Motegrity.    Denies fever, chills, blood in her stool, weight loss or symptoms that awaken her from sleep.  Past Medical History:  Diagnosis Date  . Anxiety   . Chronic headaches    none x 2-3 yrs  . Ileitis   . Infection    UTI  . Meningitis   . Ovarian cyst     Past Surgical History:  Procedure Laterality Date  . CHOLECYSTECTOMY  2016  . UPPER GASTROINTESTINAL ENDOSCOPY  2015  . WISDOM TOOTH EXTRACTION      Current Outpatient Medications  Medication Sig Dispense  Refill  . hydrOXYzine (VISTARIL) 25 MG capsule Take 25 mg by mouth 2 (two) times daily as needed.    Marland Kitchen ibuprofen (ADVIL) 800 MG tablet Take 1 tablet (800 mg total) by mouth every 8 (eight) hours as needed. 30 tablet 5  . JUNEL 1.5/30 1.5-30 MG-MCG tablet Take one tablet po daily. 28 tablet 11  . Prucalopride Succinate (MOTEGRITY) 2 MG TABS Take 1 tablet (2 mg total) by mouth daily. 30 tablet 3   No current facility-administered medications for this visit.    Allergies as of 08/01/2020 - Review Complete 06/29/2020  Allergen Reaction Noted  . Penicillins Hives 11/30/2007    Family History  Problem Relation Age of Onset  . Hypertension Mother   . Asthma Father   . Breast  cancer Maternal Aunt 42  . Birth defects Daughter        Limb reduction defect  . Colon cancer Neg Hx   . Esophageal cancer Neg Hx   . Rectal cancer Neg Hx   . Stomach cancer Neg Hx   . Colon polyps Neg Hx     Social History   Socioeconomic History  . Marital status: Married    Spouse name: Not on file  . Number of children: 2  . Years of education: Not on file  . Highest education level: Not on file  Occupational History  . Not on file  Tobacco Use  . Smoking status: Never Smoker  . Smokeless tobacco: Never Used  Vaping Use  . Vaping Use: Never used  Substance and Sexual Activity  . Alcohol use: No    Alcohol/week: 0.0 standard drinks  . Drug use: No  . Sexual activity: Yes    Partners: Male    Birth control/protection: None, Pill  Other Topics Concern  . Not on file  Social History Narrative  . Not on file   Social Determinants of Health   Financial Resource Strain: Not on file  Food Insecurity: Not on file  Transportation Needs: Not on file  Physical Activity: Not on file  Stress: Not on file  Social Connections: Not on file  Intimate Partner Violence: Not on file    Review of Systems:    Constitutional: No weight loss, fever or chills Cardiovascular: No chest pain Respiratory: No SOB  Gastrointestinal: See HPI and otherwise negative   Physical Exam:  Vital signs: BP 108/64   Pulse 66   Ht 4\' 11"  (1.499 m)   Wt 160 lb (72.6 kg)   BMI 32.32 kg/m   Constitutional:   Pleasant female appears to be in NAD, Well developed, Well nourished, alert and cooperative Respiratory: Respirations even and unlabored. Lungs clear to auscultation bilaterally.   No wheezes, crackles, or rhonchi.  Cardiovascular: Normal S1, S2. No MRG. Regular rate and rhythm. No peripheral edema, cyanosis or pallor.  Gastrointestinal:  Soft, nondistended, nontender. No rebound or guarding. Normal bowel sounds. No appreciable masses or hepatomegaly. Psychiatric: Demonstrates good  judgement and reason without abnormal affect or behaviors.  No recent labs or imaging.  Assessment: 1.  Right upper quadrant pain: Chronic over the past 7 years or so ever since patient's gallbladder was removed, worsening lately; consider scar tissue versus gastritis versus other 2.  Nausea: Increased over the past couple of months; consider relation to pain above versus gastritis versus other 3.  Chronic constipation: Controlled on Motegrity  Plan: 1.  Ordered labs today to include a CBC, CMP and lipase 2.  Ordered an abdominal ultrasound with  focus on the right upper quadrant. 3.  Prescribed Zofran 4 mg ODT every 4-6 hours as needed for nausea #30 with 1 refill. 4.  May need to consider repeating endoscopy if nausea and pain continue and ultrasound is unrevealing versus proceeding with CT scan. 5.  Did discuss this may just be scar tissue which pulls at times, but with the nausea this makes this slightly less likely. 6.  Continue Motegrity 7.  Patient to follow in clinic per recommendations after labs and imaging above.  Ellouise Newer, PA-C [facility] 08/01/2020, 1:33 PM  Cc: Melbourne Abts, FNP

## 2020-08-01 NOTE — Patient Instructions (Signed)
If you are age 34 or older, your body mass index should be between 23-30. Your Body mass index is 32.32 kg/m. If this is out of the aforementioned range listed, please consider follow up with your Primary Care Provider.  If you are age 43 or younger, your body mass index should be between 19-25. Your Body mass index is 32.32 kg/m. If this is out of the aformentioned range listed, please consider follow up with your Primary Care Provider.   We have sent the following medications to your pharmacy for you to pick up at your convenience: Zofran 4 mg   You will be contacted by [facility] in the next 2 days to arrange a ultra sound.  The number on your caller ID will be [phone], please answer when they call.  If you have not heard from them in 2 days please call [phone] to schedule.    Your provider has requested that you go to the basement level for lab work before leaving today. Press "B" on the elevator. The lab is located at the first door on the left as you exit the elevator.  Thank you for choosing me and [facility].  Ellouise Newer, PA-C

## 2020-08-02 ENCOUNTER — Ambulatory Visit (INDEPENDENT_AMBULATORY_CARE_PROVIDER_SITE_OTHER): Payer: Medicaid Other

## 2020-08-02 ENCOUNTER — Other Ambulatory Visit: Payer: Self-pay

## 2020-08-02 VITALS — BP 120/71 | HR 72

## 2020-08-02 DIAGNOSIS — R300 Dysuria: Secondary | ICD-10-CM | POA: Diagnosis not present

## 2020-08-02 LAB — POCT URINE QUALITATIVE DIPSTICK PROTEIN: Protein, UA: NEGATIVE

## 2020-08-02 NOTE — Progress Notes (Signed)
SUBJECTIVE: Lydia [name] is a 35 y.o. female who complains of urinary frequency, urgency and dysuria x 1-2 days, without flank pain, fever, chills, or abnormal vaginal discharge or bleeding.   OBJECTIVE: Appears well, in no apparent distress.  Vital signs are normal. Urine dipstick shows small amount of blood.  ASSESSMENT: Dysuria  PLAN: Treatment per orders. Call or return to clinic prn if these symptoms worsen or fail to improve as anticipated.

## 2020-08-04 NOTE — Progress Notes (Signed)
Addendum: Reviewed and agree with assessment and management plan. Pyrtle, Jay M, MD  

## 2020-08-05 LAB — URINE CULTURE

## 2020-08-08 ENCOUNTER — Other Ambulatory Visit (HOSPITAL_COMMUNITY): Payer: Medicaid Other

## 2020-08-09 ENCOUNTER — Other Ambulatory Visit: Payer: Self-pay

## 2020-08-09 ENCOUNTER — Ambulatory Visit (HOSPITAL_COMMUNITY)
Admission: RE | Admit: 2020-08-09 | Discharge: 2020-08-09 | Disposition: A | Discharge status: Home / Self Care | Payer: Medicaid Other | Source: Ambulatory Visit | Attending: Physician Assistant | Admitting: Physician Assistant

## 2020-08-09 DIAGNOSIS — R110 Nausea: Secondary | ICD-10-CM | POA: Insufficient documentation

## 2020-08-09 DIAGNOSIS — R1011 Right upper quadrant pain: Secondary | ICD-10-CM | POA: Diagnosis present

## 2020-08-18 ENCOUNTER — Other Ambulatory Visit: Payer: Self-pay

## 2020-08-18 ENCOUNTER — Ambulatory Visit (AMBULATORY_SURGERY_CENTER): Payer: Medicaid Other | Admitting: *Deleted

## 2020-08-18 VITALS — Ht 59.0 in | Wt 160.0 lb

## 2020-08-18 DIAGNOSIS — R110 Nausea: Secondary | ICD-10-CM

## 2020-08-18 DIAGNOSIS — R1011 Right upper quadrant pain: Secondary | ICD-10-CM

## 2020-08-18 NOTE — Progress Notes (Signed)
No egg or soy allergy known to patient  No issues with past sedation with any surgeries or procedures Patient denies ever being told they had issues or difficulty with intubation  No FH of Malignant Hyperthermia No diet pills per patient No home 02 use per patient  No blood thinners per patient  Pt denies issues with constipation  No A fib or A flutter  EMMI video to pt or via MyChart  COVID 19 guidelines implemented in PV today with Pt and RN  Pt is fully vaccinated  for Covid  prep and that varies from $0 to 70 dollars   Due to the COVID-19 pandemic we are asking patients to follow certain guidelines.  Pt aware of COVID protocols and [facility] guidelines   Pt verified name, DOB, address and insurance during PV today. Pt mailed instruction packet to included paper to complete and mail back to [facility] with addressed and stamped envelope, Emmi video, copy of consent form to read and not return, and instructions. PV completed over the phone. Pt encouraged to call with questions or issues.  My Chart instructions to pt as well

## 2020-09-12 ENCOUNTER — Encounter: Payer: Self-pay | Admitting: Family Medicine

## 2020-09-12 ENCOUNTER — Other Ambulatory Visit: Payer: Self-pay

## 2020-09-12 ENCOUNTER — Ambulatory Visit (INDEPENDENT_AMBULATORY_CARE_PROVIDER_SITE_OTHER): Payer: Medicaid Other | Admitting: Family Medicine

## 2020-09-12 DIAGNOSIS — M79642 Pain in left hand: Secondary | ICD-10-CM | POA: Diagnosis not present

## 2020-09-12 DIAGNOSIS — M79641 Pain in right hand: Secondary | ICD-10-CM | POA: Diagnosis not present

## 2020-09-12 DIAGNOSIS — M25522 Pain in left elbow: Secondary | ICD-10-CM | POA: Diagnosis not present

## 2020-09-12 DIAGNOSIS — M25521 Pain in right elbow: Secondary | ICD-10-CM

## 2020-09-12 NOTE — Progress Notes (Signed)
Office Visit Note   Patient: Lydia [name]           Date of Birth: August 02, 1985           MRN: [id number] Visit Date: 09/12/2020 Requested by: Melbourne Abts, [place],  [place] [address] PCP: Melbourne Abts, FNP  Subjective: Chief Complaint  Patient presents with  . Right Hand - Pain  . Left Hand - Pain    Started waking up with pain in the hands 1 months ago. It used to get better throughout the day. Now they hurt all the time. Tingling in the fingers. Pain in both elbows. Right-hand dominant.  . Left Elbow - Pain  . Right Elbow - Pain    HPI: She is here with bilateral hand pain.  She is right-hand dominant.  Symptoms started about a month ago.  She began waking up with a heavy feeling in both of her hands.  It has progressed to the point that symptoms radiate up to the elbows.  She gets tingling in her fingers at times.  Strength still seems to be okay.  She has not had any change in her activities.  She does not do a lot of repetitive activities.  Denies any neck pain with this.  She does note that her mother has had carpal tunnel surgery and her maternal aunt has possible rheumatoid arthritis.               ROS: She is otherwise feeling well from a medical standpoint.  All other systems were reviewed and are negative.  Objective: Vital Signs: LMP 08/13/2020   Physical Exam:  General:  Alert and oriented, in no acute distress. Pulm:  Breathing unlabored. Psy:  Normal mood, congruent affect. Skin: No rash Arms: She has full range of motion of the shoulders, elbows, wrists and fingers.  5/5 upper extremity strength bilaterally.  She has bilateral positive Tinel's at the carpal tunnel and positive Phalen's test recreating her symptoms.  Imaging: No results found.  Assessment & Plan: 1.  Bilateral hand and arm numbness, suspicious for carpal tunnel syndrome -We discussed options and elected to treat with carpal tunnel night splints.  If she  fails to improve, then nerve conduction studies followed by physical therapy, injection, or surgical consult if indicated.     Procedures: No procedures performed        PMFS History: Patient Active Problem List   Diagnosis Date Noted  . Acute mastitis 08/08/2015  . NSVD (normal spontaneous vaginal delivery) 08/03/2015  . Indication for care in labor and delivery, antepartum 08/02/2015  . Family history of congenital anomalies   . Dyspareunia, female 03/11/2014  . Acute recurrent maxillary sinusitis 03/11/2014  . Abdominal pain, epigastric 01/26/2014  . Tinea versicolor 12/08/2013  . Pain 12/08/2013  . Unspecified inflammatory disease of female pelvic organs and tissues 12/08/2013  . Pelvic pain in female 07/06/2013  . Dysmenorrhea 10/21/2012  . Unspecified symptom associated with female genital organs 10/07/2012  . Follicular cyst of ovary 10/07/2012  . Other and unspecified ovarian cyst 10/07/2012  . 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 HEADACHE 11/30/2007   Past Medical History:  Diagnosis Date  . Allergy   . Anxiety   . Chronic constipation    Motegrity helps   . Chronic headaches    none x 2-3 yrs  . GERD (gastroesophageal reflux disease)   . Ileitis   . Infection    UTI  .  Meningitis   . Ovarian cyst     Family History  Problem Relation Age of Onset  . Hypertension Mother   . Asthma Father   . Breast cancer Maternal Aunt 42  . Birth defects Daughter        Limb reduction defect  . Colon cancer Neg Hx   . Esophageal cancer Neg Hx   . Rectal cancer Neg Hx   . Stomach cancer Neg Hx   . Colon polyps Neg Hx     Past Surgical History:  Procedure Laterality Date  . CHOLECYSTECTOMY  2016  . COLONOSCOPY    . UPPER GASTROINTESTINAL ENDOSCOPY  2015  . WISDOM TOOTH EXTRACTION     Social History   Occupational History  . Not on file  Tobacco Use  . Smoking status: Never Smoker  . Smokeless tobacco: Never Used  Vaping Use  . Vaping Use: Never used  Substance  and Sexual Activity  . Alcohol use: No    Alcohol/week: 0.0 standard drinks  . Drug use: No  . Sexual activity: Yes    Partners: Male    Birth control/protection: None, Pill

## 2020-09-18 ENCOUNTER — Encounter: Payer: Self-pay | Admitting: Internal Medicine

## 2020-09-18 ENCOUNTER — Other Ambulatory Visit: Payer: Self-pay

## 2020-09-18 ENCOUNTER — Other Ambulatory Visit: Payer: Self-pay | Admitting: Internal Medicine

## 2020-09-18 ENCOUNTER — Encounter (AMBULATORY_SURGERY_CENTER): Payer: Medicaid Other | Admitting: Internal Medicine

## 2020-09-18 VITALS — BP 124/73 | HR 69 | Temp 97.8°F | Resp 15 | Ht 59.0 in | Wt 160.0 lb

## 2020-09-18 DIAGNOSIS — R110 Nausea: Secondary | ICD-10-CM

## 2020-09-18 DIAGNOSIS — K3189 Other diseases of stomach and duodenum: Secondary | ICD-10-CM

## 2020-09-18 DIAGNOSIS — R1013 Epigastric pain: Secondary | ICD-10-CM

## 2020-09-18 DIAGNOSIS — K319 Disease of stomach and duodenum, unspecified: Secondary | ICD-10-CM

## 2020-09-18 MED ORDER — SODIUM CHLORIDE 0.9 % IV SOLN: 500.0000 mL | Freq: Once | INTRAVENOUS | Status: DC

## 2020-09-18 NOTE — Progress Notes (Signed)
Called to room to assist during endoscopic procedure.  Patient ID and intended procedure confirmed with present staff. Received instructions for my participation in the procedure from the performing physician.  

## 2020-09-18 NOTE — Progress Notes (Signed)
1010 Robinul 0.1 mg IV given due large amount of secretions upon assessment.  MD made aware, vss 

## 2020-09-18 NOTE — Patient Instructions (Signed)
YOU HAD AN ENDOSCOPIC PROCEDURE TODAY AT THE [facility]:   Refer to the procedure report that was given to you for any specific questions about what was found during the examination.  If the procedure report does not answer your questions, please call your gastroenterologist to clarify.  If you requested that your care partner not be given the details of your procedure findings, then the procedure report has been included in a sealed envelope for you to review at your convenience later.  YOU SHOULD EXPECT: Some feelings of bloating in the abdomen. Passage of more gas than usual.  Walking can help get rid of the air that was put into your GI tract during the procedure and reduce the bloating. If you had a lower endoscopy (such as a colonoscopy or flexible sigmoidoscopy) you may notice spotting of blood in your stool or on the toilet paper. If you underwent a bowel prep for your procedure, you may not have a normal bowel movement for a few days.  Please Note:  You might notice some irritation and congestion in your nose or some drainage.  This is from the oxygen used during your procedure.  There is no need for concern and it should clear up in a day or so.  SYMPTOMS TO REPORT IMMEDIATELY:    Following upper endoscopy (EGD)  Vomiting of blood or coffee ground material  New chest pain or pain under the shoulder blades  Painful or persistently difficult swallowing  New shortness of breath  Fever of 100F or higher  Black, tarry-looking stools  For urgent or emergent issues, a gastroenterologist can be reached at any hour by calling [phone]. Do not use MyChart messaging for urgent concerns.    DIET:  We do recommend a small meal at first, but then you may proceed to your regular diet.  Drink plenty of fluids but you should avoid alcoholic beverages for 24 hours.  ACTIVITY:  You should plan to take it easy for the rest of today and you should NOT DRIVE or use heavy machinery  until tomorrow (because of the sedation medicines used during the test).    FOLLOW UP: Our staff will call the number listed on your records 48-72 hours following your procedure to check on you and address any questions or concerns that you may have regarding the information given to you following your procedure. If we do not reach you, we will leave a message.  We will attempt to reach you two times.  During this call, we will ask if you have developed any symptoms of COVID 19. If you develop any symptoms (ie: fever, flu-like symptoms, shortness of breath, cough etc.) before then, please call [phone].  If you test positive for Covid 19 in the 2 weeks post procedure, please call and report this information to us.    If any biopsies were taken you will be contacted by phone or by letter within the next 1-3 weeks.  Please call us at [phone] if you have not heard about the biopsies in 3 weeks.    SIGNATURES/CONFIDENTIALITY: You and/or your care partner have signed paperwork which will be entered into your electronic medical record.  These signatures attest to the fact that that the information above on your After Visit Summary has been reviewed and is understood.  Full responsibility of the confidentiality of this discharge information lies with you and/or your care-partner. 

## 2020-09-18 NOTE — Progress Notes (Signed)
C.W. vital signs. 

## 2020-09-18 NOTE — Progress Notes (Signed)
Report given to PACU, vss 

## 2020-09-18 NOTE — Progress Notes (Signed)
Pt's states no medical or surgical changes since previsit or office visit. 

## 2020-09-18 NOTE — Op Note (Signed)
[facility] Patient Name: Lydia [name] Procedure Date: 09/18/2020 10:11 AM MRN: [id number] Endoscopist: Jerene Bears , MD Age: 35 Referring MD:  Date of Birth: 1985-11-15 Gender: Female Account #: [id number] Procedure:                Upper GI endoscopy Indications:              Epigastric abdominal pain, Nausea Medicines:                Monitored Anesthesia Care Procedure:                Pre-Anesthesia Assessment:                           - Prior to the procedure, a History and Physical                            was performed, and patient medications and                            allergies were reviewed. The patient's tolerance of                            previous anesthesia was also reviewed. The risks                            and benefits of the procedure and the sedation                            options and risks were discussed with the patient.                            All questions were answered, and informed consent                            was obtained. Prior Anticoagulants: The patient has                            taken no previous anticoagulant or antiplatelet                            agents. ASA Grade Assessment: II - A patient with                            mild systemic disease. After reviewing the risks                            and benefits, the patient was deemed in                            satisfactory condition to undergo the procedure.                           After obtaining informed consent, the endoscope was  passed under direct vision. Throughout the                            procedure, the patient's blood pressure, pulse, and                            oxygen saturations were monitored continuously. The                            Endoscope was introduced through the mouth, and                            advanced to the second part of duodenum. The upper                            GI endoscopy was  accomplished without difficulty.                            The patient tolerated the procedure well. Scope In: Scope Out: Findings:                 The examined esophagus was normal. Biopsies were                            taken with a cold forceps for histology.                           The entire examined stomach was normal. Biopsies                            were taken with a cold forceps for histology and                            Helicobacter pylori testing.                           The examined duodenum was normal. Biopsies for                            histology were taken with a cold forceps for                            evaluation of celiac disease. Complications:            No immediate complications. Estimated Blood Loss:     Estimated blood loss was minimal. Impression:               - Normal esophagus. Biopsied.                           - Normal stomach. Biopsied.                           - Normal examined duodenum. Biopsied. Recommendation:           - Patient has a contact number available for  emergencies. The signs and symptoms of potential                            delayed complications were discussed with the                            patient. Return to normal activities tomorrow.                            Written discharge instructions were provided to the                            patient.                           - Resume previous diet.                           - Continue present medications.                           - Await pathology results.                           - If biopsies are unrevealing then proceed to CT                            scan of the abdomen in pelvis to exclude enteritis                            (nonspecific ileitis was seen on colonoscopy). Jerene Bears, MD 09/18/2020 10:30:26 AM This report has been signed electronically.

## 2020-09-20 ENCOUNTER — Telehealth: Payer: Self-pay

## 2020-09-20 NOTE — Telephone Encounter (Signed)
  Follow up Call-  Call back number 09/18/2020 03/24/2020 03/12/2019  Post procedure Call Back phone  # [phone]  Permission to leave phone message Yes Yes Yes  Some recent data might be hidden     Patient questions:  Do you have a fever, pain , or abdominal swelling? No. Pain Score  0 *  Have you tolerated food without any problems? Yes.    Have you been able to return to your normal activities? Yes.    Do you have any questions about your discharge instructions: Diet   No. Medications  No. Follow up visit  No.  Do you have questions or concerns about your Care? Yes.    Pt. States she has had some discomfort swallowing food since the procedure, states it hasn't gotten worse and she is able to eat. I advised her to please call us if this does not continue to improve or gets worse. She states she will do so.  Actions: * If pain score is 4 or above: No action needed, pain <4. 1. Have you developed a fever since your procedure? no  2.   Have you had an respiratory symptoms (SOB or cough) since your procedure? no  3.   Have you tested positive for COVID 19 since your procedure no  4.   Have you had any family members/close contacts diagnosed with the COVID 19 since your procedure?  no   If yes to any of these questions please route to Joylene John, RN and Joella Prince, RN

## 2020-09-22 NOTE — Telephone Encounter (Signed)
DOD  Chart reviewed EGD note reviewed  Plan: -Lets try GI cocktail (equal amounts of Maalox, viscous lidocaine and liquid Bentyl) 15 cc p.o. every 6-8 hours as needed. -She should let us know how she feels early next week Vaughan Basta, can we find out what happened to the biopsies -If still with problems will give her Carafate (liquid Carafate would be very expensive.  Hence likely second line)  RG

## 2020-09-27 DIAGNOSIS — R1013 Epigastric pain: Secondary | ICD-10-CM

## 2020-09-27 DIAGNOSIS — R110 Nausea: Secondary | ICD-10-CM

## 2020-09-27 DIAGNOSIS — K529 Noninfective gastroenteritis and colitis, unspecified: Secondary | ICD-10-CM

## 2020-09-28 ENCOUNTER — Encounter: Payer: Self-pay | Admitting: Internal Medicine

## 2020-09-28 NOTE — Telephone Encounter (Signed)
MR enterography order in epic. Message sent to radiology schedulers (April Pait and Rhys Martini).

## 2020-09-28 NOTE — Telephone Encounter (Signed)
Please let patient know that her pathology results are reviewed They are normal (mild gastropathy in the stomach but no infection or abnormal cell).  The esophagus and duodenal biopsies are normal  She did have ileitis at colonoscopy and I recommend MR enterography (selected over CT given rationed IV contrast).  Rule out Crohn's enteritis.  Symptoms: upper abd pain, nausea, history of ileitis   Thanks

## 2020-09-28 NOTE — Telephone Encounter (Signed)
Per radiology, they need orders for MR Enterography abdomen and pelvis with and without. Additional order entered.

## 2020-10-04 ENCOUNTER — Encounter: Payer: Self-pay | Admitting: Family Medicine

## 2020-10-04 DIAGNOSIS — M79641 Pain in right hand: Secondary | ICD-10-CM

## 2020-10-04 DIAGNOSIS — M79642 Pain in left hand: Secondary | ICD-10-CM

## 2020-10-17 ENCOUNTER — Ambulatory Visit (HOSPITAL_COMMUNITY)
Admission: RE | Admit: 2020-10-17 | Discharge: 2020-10-17 | Disposition: A | Discharge status: Home / Self Care | Payer: Medicaid Other | Source: Ambulatory Visit | Attending: Internal Medicine | Admitting: Internal Medicine

## 2020-10-17 ENCOUNTER — Other Ambulatory Visit: Payer: Self-pay

## 2020-10-17 DIAGNOSIS — R1013 Epigastric pain: Secondary | ICD-10-CM | POA: Diagnosis present

## 2020-10-17 DIAGNOSIS — R110 Nausea: Secondary | ICD-10-CM

## 2020-10-17 DIAGNOSIS — K529 Noninfective gastroenteritis and colitis, unspecified: Secondary | ICD-10-CM

## 2020-10-17 MED ORDER — BARIUM SULFATE 0.1 % PO SUSP
450.0000 mL | Freq: Once | ORAL | Status: AC
  Administered 2020-10-17: 450 mL via ORAL

## 2020-10-17 MED ORDER — GADOBUTROL 1 MMOL/ML IV SOLN
7.0000 mL | Freq: Once | INTRAVENOUS | Status: AC | PRN
  Administered 2020-10-17: 7 mL via INTRAVENOUS

## 2020-11-03 ENCOUNTER — Ambulatory Visit: Payer: Self-pay | Admitting: General Surgery

## 2020-11-03 NOTE — Progress Notes (Addendum)
PCP - Dr. Karie Mainland medical Cardiologist - no  PPM/ICD -  Device Orders -  Rep Notified -   Chest x-ray -  EKG -  Stress Test -  ECHO -  Cardiac Cath -   Sleep Study -  CPAP -   Fasting Blood Sugar -  Checks Blood Sugar _____ times a day  Blood Thinner Instructions: Aspirin Instructions:  ERAS Protcol - PRE-SURGERY Ensure or G2-   COVID TEST- Ambulatory  Activity--Able to walk a flight of stairs without SOB or CP Anesthesia review:   Patient denies shortness of breath, fever, cough and chest pain at PAT appointment   All instructions explained to the patient, with a verbal understanding of the material. Patient agrees to go over the instructions while at home for a better understanding. Patient also instructed to self quarantine after being tested for COVID-19. The opportunity to ask questions was provided.

## 2020-11-03 NOTE — Patient Instructions (Addendum)
DUE TO COVID-19 ONLY ONE VISITOR IS ALLOWED TO COME WITH YOU AND STAY IN THE WAITING ROOM ONLY DURING PRE OP AND PROCEDURE DAY OF SURGERY.   TWO VISITOR  MAY VISIT WITH YOU AFTER SURGERY IN YOUR PRIVATE ROOM DURING VISITING HOURS ONLY!  YOU NEED TO HAVE A COVID 19 TEST ON__NOT NEEDED_____ @_______ , THIS TEST MUST BE DONE BEFORE SURGERY,  COVID TESTING SITE [place] [id number],IT IS ON THE RIGHT GOING OUT [address] APPROXIMATELY  2 MINUTES PAST [employer] ON THE RIGHT. ONCE YOUR COVID TEST IS COMPLETED,  PLEASE BEGIN THE QUARANTINE INSTRUCTIONS AS OUTLINED IN YOUR HANDOUT.                Lydia [name]  11/03/2020   Your procedure is scheduled on: 11-10-20   Report to [facility] Main  Entrance   Report to admitting at        200 PM     Call this number if you have problems the morning of surgery [phone]    Remember: NO SOLID FOOD AFTER MIDNIGHT THE NIGHT PRIOR TO SURGERY. NOTHING BY MOUTH EXCEPT CLEAR LIQUIDS UNTIL     100pm   PLEASE FINISH ENSURE DRINK PER SURGEON ORDER  WHICH NEEDS TO BE COMPLETED AT       100 pm then nothing by mouth.     CLEAR LIQUID DIET   Foods Allowed                                                                     Foods Excluded water Black Coffee and tea, regular and decaf                             liquids that you cannot  Plain Jell-O any favor except red or purple                                           see through such as: Fruit ices (not with fruit pulp)                                                   milk, soups, orange juice  Iced Popsicles                                                       All solid food Carbonated beverages, regular and diet                                    Cranberry, grape and apple juices Sports drinks like Gatorade Lightly seasoned clear broth or consume(fat free) Sugar, honey syrup  _____________________________________________________________________      BRUSH YOUR TEETH  MORNING OF SURGERY AND RINSE YOUR MOUTH OUT, NO CHEWING GUM CANDY OR MINTS.     Take these medicines the morning of surgery with A SIP OF WATER: BC pill                                You may not have any metal on your body including hair pins and              piercings  Do not wear jewelry, make-up, lotions, powders or perfumes, deodorant             Do not wear nail polish on your fingernails or toenails failure to comply may result in cancellation of your surgery.  Do not shave  48 hours prior to surgery.               Do not bring valuables to the hospital. [place].  Contacts, dentures or bridgework may not be worn into surgery.       Patients discharged the day of surgery will not be allowed to drive home. IF YOU ARE HAVING SURGERY AND GOING HOME THE SAME DAY, YOU MUST HAVE AN ADULT TO DRIVE YOU HOME AND BE WITH YOU FOR 24 HOURS. YOU MAY GO HOME BY TAXI OR UBER OR ORTHERWISE, BUT AN ADULT MUST ACCOMPANY YOU HOME AND STAY WITH YOU FOR 24 HOURS.  Name and phone number of your driver:  Special Instructions: N/A              Please read over the following fact sheets you were given: _____________________________________________________________________             [facility] - Preparing for Surgery Before surgery, you can play an important role.  Because skin is not sterile, your skin needs to be as free of germs as possible.  You can reduce the number of germs on your skin by washing with CHG (chlorahexidine gluconate) soap before surgery.  CHG is an antiseptic cleaner which kills germs and bonds with the skin to continue killing germs even after washing. Please DO NOT use if you have an allergy to CHG or antibacterial soaps.  If your skin becomes reddened/irritated stop using the CHG and inform your nurse when you arrive at Short Stay. Do not shave (including legs and underarms) for at least 48 hours prior to the  first CHG shower.  You may shave your face/neck. Please follow these instructions carefully:  1.  Shower with CHG Soap the night before surgery and the  morning of Surgery.  2.  If you choose to wash your hair, wash your hair first as usual with your  normal  shampoo.  3.  After you shampoo, rinse your hair and body thoroughly to remove the  shampoo.                           4.  Use CHG as you would any other liquid soap.  You can apply chg directly  to the skin and wash                       Gently with a scrungie or clean washcloth.  5.  Apply the CHG Soap to your body ONLY FROM THE NECK DOWN.  Do not use on face/ open                           Wound or open sores. Avoid contact with eyes, ears mouth and genitals (private parts).                       Wash face,  Genitals (private parts) with your normal soap.             6.  Wash thoroughly, paying special attention to the area where your surgery  will be performed.  7.  Thoroughly rinse your body with warm water from the neck down.  8.  DO NOT shower/wash with your normal soap after using and rinsing off  the CHG Soap.                9.  Pat yourself dry with a clean towel.            10.  Wear clean pajamas.            11.  Place clean sheets on your bed the night of your first shower and do not  sleep with pets. Day of Surgery : Do not apply any lotions/deodorants the morning of surgery.  Please wear clean clothes to the hospital/surgery center.  FAILURE TO FOLLOW THESE INSTRUCTIONS MAY RESULT IN THE CANCELLATION OF YOUR SURGERY PATIENT SIGNATURE_________________________________  NURSE SIGNATURE__________________________________  ________________________________________________________________________

## 2020-11-07 ENCOUNTER — Encounter (HOSPITAL_COMMUNITY): Payer: Self-pay

## 2020-11-07 ENCOUNTER — Other Ambulatory Visit: Payer: Self-pay

## 2020-11-07 ENCOUNTER — Encounter (HOSPITAL_COMMUNITY)
Admission: RE | Admit: 2020-11-07 | Discharge: 2020-11-07 | Disposition: A | Discharge status: Home / Self Care | Payer: Medicaid Other | Source: Ambulatory Visit | Attending: General Surgery | Admitting: General Surgery

## 2020-11-07 DIAGNOSIS — Z01812 Encounter for preprocedural laboratory examination: Secondary | ICD-10-CM | POA: Insufficient documentation

## 2020-11-07 LAB — CBC
HCT: 38.7 % (ref 36.0–46.0)
Hemoglobin: 12.7 g/dL (ref 12.0–15.0)
MCH: 29.7 pg (ref 26.0–34.0)
MCHC: 32.8 g/dL (ref 30.0–36.0)
MCV: 90.4 fL (ref 80.0–100.0)
Platelets: 369 10*3/uL (ref 150–400)
RBC: 4.28 MIL/uL (ref 3.87–5.11)
RDW: 12 % (ref 11.5–15.5)
WBC: 10 10*3/uL (ref 4.0–10.5)
nRBC: 0 % (ref 0.0–0.2)

## 2020-11-10 ENCOUNTER — Ambulatory Visit (HOSPITAL_COMMUNITY): Payer: Medicaid Other | Admitting: Certified Registered Nurse Anesthetist

## 2020-11-10 ENCOUNTER — Ambulatory Visit (HOSPITAL_COMMUNITY)
Admission: RE | Admit: 2020-11-10 | Discharge: 2020-11-10 | Disposition: A | Discharge status: Home / Self Care | Payer: Medicaid Other | Source: Ambulatory Visit | Attending: General Surgery | Admitting: General Surgery

## 2020-11-10 ENCOUNTER — Encounter (HOSPITAL_COMMUNITY): Payer: Self-pay | Admitting: General Surgery

## 2020-11-10 ENCOUNTER — Encounter (HOSPITAL_COMMUNITY)
Admission: RE | Disposition: A | Discharge status: Home / Self Care | Payer: Self-pay | Source: Ambulatory Visit | Attending: General Surgery

## 2020-11-10 DIAGNOSIS — Z79899 Other long term (current) drug therapy: Secondary | ICD-10-CM | POA: Diagnosis not present

## 2020-11-10 DIAGNOSIS — Z9049 Acquired absence of other specified parts of digestive tract: Secondary | ICD-10-CM | POA: Insufficient documentation

## 2020-11-10 DIAGNOSIS — D214 Benign neoplasm of connective and other soft tissue of abdomen: Secondary | ICD-10-CM | POA: Diagnosis not present

## 2020-11-10 DIAGNOSIS — K388 Other specified diseases of appendix: Secondary | ICD-10-CM | POA: Diagnosis present

## 2020-11-10 DIAGNOSIS — Z793 Long term (current) use of hormonal contraceptives: Secondary | ICD-10-CM | POA: Insufficient documentation

## 2020-11-10 DIAGNOSIS — Z880 Allergy status to penicillin: Secondary | ICD-10-CM | POA: Insufficient documentation

## 2020-11-10 HISTORY — PX: LAPAROSCOPIC APPENDECTOMY: SHX408

## 2020-11-10 LAB — PREGNANCY, URINE: Preg Test, Ur: NEGATIVE

## 2020-11-10 SURGERY — APPENDECTOMY, LAPAROSCOPIC
Anesthesia: General

## 2020-11-10 MED ORDER — ROCURONIUM BROMIDE 100 MG/10ML IV SOLN
INTRAVENOUS | Status: DC | PRN
  Administered 2020-11-10: 10 mg via INTRAVENOUS
  Administered 2020-11-10: 40 mg via INTRAVENOUS

## 2020-11-10 MED ORDER — BUPIVACAINE-EPINEPHRINE 0.25% -1:200000 IJ SOLN
INTRAMUSCULAR | Status: DC | PRN
Start: 2020-11-10 — End: 2020-11-11
  Administered 2020-11-10: 30 mL

## 2020-11-10 MED ORDER — ORAL CARE MOUTH RINSE: 15.0000 mL | Freq: Once | OROMUCOSAL | Status: AC

## 2020-11-10 MED ORDER — FENTANYL CITRATE (PF) 100 MCG/2ML IJ SOLN
INTRAMUSCULAR | Status: AC
  Filled 2020-11-10: qty 2

## 2020-11-10 MED ORDER — PROPOFOL 10 MG/ML IV BOLUS
INTRAVENOUS | Status: DC | PRN
  Administered 2020-11-10: 200 mg via INTRAVENOUS

## 2020-11-10 MED ORDER — IBUPROFEN 800 MG PO TABS: 800.0000 mg | ORAL_TABLET | Freq: Three times a day (TID) | ORAL | 0 refills | Status: DC | PRN

## 2020-11-10 MED ORDER — FENTANYL CITRATE (PF) 250 MCG/5ML IJ SOLN
INTRAMUSCULAR | Status: AC
  Filled 2020-11-10: qty 5

## 2020-11-10 MED ORDER — OXYCODONE HCL 5 MG PO TABS: 5.0000 mg | ORAL_TABLET | Freq: Four times a day (QID) | ORAL | 0 refills | Status: DC | PRN

## 2020-11-10 MED ORDER — SUGAMMADEX SODIUM 200 MG/2ML IV SOLN
INTRAVENOUS | Status: DC | PRN
  Administered 2020-11-10: 200 mg via INTRAVENOUS

## 2020-11-10 MED ORDER — ONDANSETRON HCL 4 MG/2ML IJ SOLN: 4.0000 mg | Freq: Once | INTRAMUSCULAR | Status: DC | PRN

## 2020-11-10 MED ORDER — ACETAMINOPHEN 500 MG PO TABS
1000.0000 mg | ORAL_TABLET | ORAL | Status: AC
  Administered 2020-11-10: 1000 mg via ORAL
  Filled 2020-11-10: qty 2

## 2020-11-10 MED ORDER — KETOROLAC TROMETHAMINE 30 MG/ML IJ SOLN
INTRAMUSCULAR | Status: AC
  Filled 2020-11-10: qty 1

## 2020-11-10 MED ORDER — CHLORHEXIDINE GLUCONATE CLOTH 2 % EX PADS: 6.0000 | MEDICATED_PAD | Freq: Once | CUTANEOUS | Status: DC

## 2020-11-10 MED ORDER — CEFAZOLIN SODIUM-DEXTROSE 2-4 GM/100ML-% IV SOLN
2.0000 g | INTRAVENOUS | Status: AC
  Administered 2020-11-10: 2 g via INTRAVENOUS
  Filled 2020-11-10: qty 100

## 2020-11-10 MED ORDER — KETOROLAC TROMETHAMINE 30 MG/ML IJ SOLN
30.0000 mg | Freq: Once | INTRAMUSCULAR | Status: AC
  Administered 2020-11-10: 30 mg via INTRAVENOUS

## 2020-11-10 MED ORDER — LACTATED RINGERS IV SOLN: INTRAVENOUS | Status: DC

## 2020-11-10 MED ORDER — MIDAZOLAM HCL 2 MG/2ML IJ SOLN
INTRAMUSCULAR | Status: AC
  Filled 2020-11-10: qty 2

## 2020-11-10 MED ORDER — HYDROMORPHONE HCL 1 MG/ML IJ SOLN
0.2500 mg | INTRAMUSCULAR | Status: DC | PRN
Start: 2020-11-10 — End: 2020-11-11

## 2020-11-10 MED ORDER — FENTANYL CITRATE (PF) 100 MCG/2ML IJ SOLN
25.0000 ug | INTRAMUSCULAR | Status: DC | PRN
  Administered 2020-11-10 (×3): 50 ug via INTRAVENOUS

## 2020-11-10 MED ORDER — LIDOCAINE HCL (CARDIAC) PF 100 MG/5ML IV SOSY
PREFILLED_SYRINGE | INTRAVENOUS | Status: DC | PRN
  Administered 2020-11-10: 40 mg via INTRAVENOUS

## 2020-11-10 MED ORDER — AMISULPRIDE (ANTIEMETIC) 5 MG/2ML IV SOLN: 10.0000 mg | Freq: Once | INTRAVENOUS | Status: DC | PRN

## 2020-11-10 MED ORDER — RINGERS IRRIGATION IR SOLN
Status: DC | PRN
  Administered 2020-11-10: 1000 mL

## 2020-11-10 MED ORDER — BUPIVACAINE-EPINEPHRINE (PF) 0.25% -1:200000 IJ SOLN
INTRAMUSCULAR | Status: AC
  Filled 2020-11-10: qty 30

## 2020-11-10 MED ORDER — SUCCINYLCHOLINE CHLORIDE 200 MG/10ML IV SOSY
PREFILLED_SYRINGE | INTRAVENOUS | Status: DC | PRN
  Administered 2020-11-10: 80 mg via INTRAVENOUS

## 2020-11-10 MED ORDER — OXYCODONE HCL 5 MG PO TABS
ORAL_TABLET | ORAL | Status: AC
  Filled 2020-11-10: qty 1

## 2020-11-10 MED ORDER — FENTANYL CITRATE (PF) 100 MCG/2ML IJ SOLN
INTRAMUSCULAR | Status: DC | PRN
  Administered 2020-11-10: 100 ug via INTRAVENOUS
  Administered 2020-11-10 (×2): 50 ug via INTRAVENOUS
  Administered 2020-11-10: 25 ug via INTRAVENOUS
  Administered 2020-11-10: 100 ug via INTRAVENOUS
  Administered 2020-11-10: 25 ug via INTRAVENOUS

## 2020-11-10 MED ORDER — DEXAMETHASONE SODIUM PHOSPHATE 10 MG/ML IJ SOLN
INTRAMUSCULAR | Status: DC | PRN
  Administered 2020-11-10: 5 mg via INTRAVENOUS

## 2020-11-10 MED ORDER — MIDAZOLAM HCL 5 MG/5ML IJ SOLN
INTRAMUSCULAR | Status: DC | PRN
  Administered 2020-11-10: 2 mg via INTRAVENOUS

## 2020-11-10 MED ORDER — OXYCODONE HCL 5 MG PO TABS
5.0000 mg | ORAL_TABLET | Freq: Once | ORAL | Status: AC | PRN
  Administered 2020-11-10: 5 mg via ORAL

## 2020-11-10 MED ORDER — SODIUM CHLORIDE 0.9 % IR SOLN
Status: DC | PRN
Start: 2020-11-10 — End: 2020-11-11
  Administered 2020-11-10: 1000 mL

## 2020-11-10 MED ORDER — ONDANSETRON HCL 4 MG/2ML IJ SOLN
INTRAMUSCULAR | Status: DC | PRN
  Administered 2020-11-10: 4 mg via INTRAVENOUS

## 2020-11-10 MED ORDER — CELECOXIB 200 MG PO CAPS
400.0000 mg | ORAL_CAPSULE | ORAL | Status: AC
  Administered 2020-11-10: 400 mg via ORAL
  Filled 2020-11-10: qty 2

## 2020-11-10 MED ORDER — PROPOFOL 10 MG/ML IV BOLUS
INTRAVENOUS | Status: AC
  Filled 2020-11-10: qty 20

## 2020-11-10 MED ORDER — OXYCODONE HCL 5 MG/5ML PO SOLN: 5.0000 mg | Freq: Once | ORAL | Status: AC | PRN

## 2020-11-10 MED ORDER — CHLORHEXIDINE GLUCONATE 0.12 % MT SOLN
15.0000 mL | Freq: Once | OROMUCOSAL | Status: AC
  Administered 2020-11-10: 15 mL via OROMUCOSAL

## 2020-11-10 MED ORDER — ENSURE PRE-SURGERY PO LIQD: 296.0000 mL | Freq: Once | ORAL | Status: DC

## 2020-11-10 SURGICAL SUPPLY — 61 items
ADH SKN CLS APL DERMABOND .7 (GAUZE/BANDAGES/DRESSINGS) ×1
APL SKNCLS STERI-STRIP NONHPOA (GAUZE/BANDAGES/DRESSINGS) ×2
APPLIER CLIP 5 13 M/L LIGAMAX5 (MISCELLANEOUS)
APPLIER CLIP ROT 10 11.4 M/L (STAPLE)
APR CLP MED LRG 11.4X10 (STAPLE)
APR CLP MED LRG 5 ANG JAW (MISCELLANEOUS)
BAG COUNTER SPONGE SURGICOUNT (BAG) IMPLANT
BAG SPEC RTRVL 10 TROC 200 (ENDOMECHANICALS) ×1
BAG SPNG CNTER NS LX DISP (BAG)
BAG SURGICOUNT SPONGE COUNTING (BAG)
BENZOIN TINCTURE PRP APPL 2/3 (GAUZE/BANDAGES/DRESSINGS) ×6 IMPLANT
BNDG ADH 1X3 SHEER STRL LF (GAUZE/BANDAGES/DRESSINGS) ×15 IMPLANT
BNDG ADH THN 3X1 STRL LF (GAUZE/BANDAGES/DRESSINGS) ×5
CABLE HIGH FREQUENCY MONO STRZ (ELECTRODE) ×3 IMPLANT
CLIP APPLIE 5 13 M/L LIGAMAX5 (MISCELLANEOUS) IMPLANT
CLIP APPLIE ROT 10 11.4 M/L (STAPLE) IMPLANT
CLIP VESOLOCK XL 6/CT (CLIP) ×3 IMPLANT
CLOSURE WOUND 1/2 X4 (GAUZE/BANDAGES/DRESSINGS) ×1
CLOSURE WOUND 1/4X4 (GAUZE/BANDAGES/DRESSINGS) ×1
CNTNR URN SCR LID CUP LEK RST (MISCELLANEOUS) IMPLANT
CONT SPEC 4OZ STRL OR WHT (MISCELLANEOUS) ×3
COVER SURGICAL LIGHT HANDLE (MISCELLANEOUS) ×3 IMPLANT
CUTTER FLEX LINEAR 45M (STAPLE) IMPLANT
DECANTER SPIKE VIAL GLASS SM (MISCELLANEOUS) ×3 IMPLANT
DERMABOND ADVANCED (GAUZE/BANDAGES/DRESSINGS) ×2
DERMABOND ADVANCED .7 DNX12 (GAUZE/BANDAGES/DRESSINGS) IMPLANT
DRAIN CHANNEL 19F RND (DRAIN) IMPLANT
DRAPE LAPAROSCOPIC ABDOMINAL (DRAPES) ×3 IMPLANT
ELECT PENCIL ROCKER SW 15FT (MISCELLANEOUS) ×2 IMPLANT
ELECT REM PT RETURN 15FT ADLT (MISCELLANEOUS) ×3 IMPLANT
ENDOLOOP SUT PDS II  0 18 (SUTURE)
ENDOLOOP SUT PDS II 0 18 (SUTURE) IMPLANT
EVACUATOR SILICONE 100CC (DRAIN) IMPLANT
GLOVE SURG POLYISO LF SZ7 (GLOVE) ×3 IMPLANT
GLOVE SURG UNDER POLY LF SZ7 (GLOVE) ×3 IMPLANT
GOWN STRL REUS W/TWL LRG LVL3 (GOWN DISPOSABLE) ×3 IMPLANT
GOWN STRL REUS W/TWL XL LVL3 (GOWN DISPOSABLE) IMPLANT
GRASPER SUT TROCAR 14GX15 (MISCELLANEOUS) IMPLANT
KIT BASIN OR (CUSTOM PROCEDURE TRAY) ×3 IMPLANT
KIT TURNOVER KIT A (KITS) ×3 IMPLANT
PENCIL SMOKE EVACUATOR (MISCELLANEOUS) IMPLANT
POUCH RETRIEVAL ECOSAC 10 (ENDOMECHANICALS) ×1 IMPLANT
POUCH RETRIEVAL ECOSAC 10MM (ENDOMECHANICALS) ×3
RELOAD 45 VASCULAR/THIN (ENDOMECHANICALS) IMPLANT
RELOAD STAPLE 45 2.5 WHT GRN (ENDOMECHANICALS) IMPLANT
RELOAD STAPLE 45 3.5 BLU ETS (ENDOMECHANICALS) IMPLANT
RELOAD STAPLE TA45 3.5 REG BLU (ENDOMECHANICALS) IMPLANT
SCISSORS LAP 5X35 DISP (ENDOMECHANICALS) ×3 IMPLANT
SET IRRIG TUBING LAPAROSCOPIC (IRRIGATION / IRRIGATOR) ×3 IMPLANT
SET TUBE SMOKE EVAC HIGH FLOW (TUBING) ×3 IMPLANT
SHEARS HARMONIC ACE PLUS 36CM (ENDOMECHANICALS) ×2 IMPLANT
SLEEVE XCEL OPT CAN 5 100 (ENDOMECHANICALS) ×3 IMPLANT
STRIP CLOSURE SKIN 1/2X4 (GAUZE/BANDAGES/DRESSINGS) ×2 IMPLANT
STRIP CLOSURE SKIN 1/4X4 (GAUZE/BANDAGES/DRESSINGS) ×2 IMPLANT
SUT ETHILON 2 0 PS N (SUTURE) IMPLANT
SUT MNCRL AB 4-0 PS2 18 (SUTURE) ×3 IMPLANT
TOWEL OR 17X26 10 PK STRL BLUE (TOWEL DISPOSABLE) ×3 IMPLANT
TOWEL OR NON WOVEN STRL DISP B (DISPOSABLE) ×3 IMPLANT
TRAY LAPAROSCOPIC (CUSTOM PROCEDURE TRAY) ×3 IMPLANT
TROCAR BLADELESS OPT 5 100 (ENDOMECHANICALS) ×3 IMPLANT
TROCAR XCEL 12X100 BLDLESS (ENDOMECHANICALS) ×3 IMPLANT

## 2020-11-10 NOTE — Anesthesia Postprocedure Evaluation (Signed)
Anesthesia Post Note  Patient: Lydia [name]  Procedure(s) Performed: LAPAROSCOPIC APPENDECTOMY     Patient location during evaluation: PACU Anesthesia Type: General Level of consciousness: awake and alert Pain management: pain level controlled Vital Signs Assessment: post-procedure vital signs reviewed and stable Respiratory status: spontaneous breathing, nonlabored ventilation and respiratory function stable Cardiovascular status: blood pressure returned to baseline and stable Postop Assessment: no apparent nausea or vomiting Anesthetic complications: no   No notable events documented.  Last Vitals:  Vitals:   11/10/20 1749 11/10/20 1800  BP: 125/72 122/69  Pulse: (!) 109 (!) 103  Resp: 17 19  Temp: 36.4 C   SpO2: 100% 100%    Last Pain:  Vitals:   11/10/20 1800  TempSrc:   PainSc: 6                  [name]

## 2020-11-10 NOTE — H&P (Signed)
Lydia [name] is an 35 y.o. female.   Chief Complaint: appendix mass HPI: 35 yo female with findings of appendix mass on imaging for abdominal pain work up. She presents for surgery.  Past Medical History:  Diagnosis Date   Allergy    Anxiety    Chronic constipation    Motegrity helps    Chronic headaches    none x 2-3 yrs   GERD (gastroesophageal reflux disease)    Ileitis    Infection    UTI   Meningitis    Ovarian cyst     Past Surgical History:  Procedure Laterality Date   CHOLECYSTECTOMY  2016   COLONOSCOPY     UPPER GASTROINTESTINAL ENDOSCOPY  2015   WISDOM TOOTH EXTRACTION      Family History  Problem Relation Age of Onset   Hypertension Mother    Asthma Father    Breast cancer Maternal Aunt 21   Birth defects Daughter        Limb reduction defect   Colon cancer Neg Hx    Esophageal cancer Neg Hx    Rectal cancer Neg Hx    Stomach cancer Neg Hx    Colon polyps Neg Hx    Social History:  reports that she has never smoked. She has never used smokeless tobacco. She reports that she does not drink alcohol and does not use drugs.  Allergies:  Allergies  Allergen Reactions   Penicillins Hives    Has patient had a PCN reaction causing immediate rash, facial/tongue/throat swelling, SOB or lightheadedness with hypotension: Yes Has patient had a PCN reaction causing severe rash involving mucus membranes or skin necrosis: Yes Has patient had a PCN reaction that required hospitalization no Has patient had a PCN reaction occurring within the last 10 years: No If all of the above answers are "NO", then may proceed with Cephalosporin use.    Medications Prior to Admission  Medication Sig Dispense Refill   acetaminophen (TYLENOL) 500 MG tablet Take 1,000 mg by mouth every 8 (eight) hours as needed.     hydrOXYzine (VISTARIL) 25 MG capsule Take 25 mg by mouth 2 (two) times daily as needed.     ibuprofen (ADVIL) 800 MG tablet Take 1 tablet (800 mg total) by mouth every  8 (eight) hours as needed. 30 tablet 5   JUNEL 1.5/30 1.5-30 MG-MCG tablet Take one tablet po daily. 28 tablet 11   JUNEL FE 1.5/30 1.5-30 MG-MCG tablet Take 1 tablet by mouth daily.     ondansetron (ZOFRAN) 4 MG tablet Take 1 tablet (4 mg total) by mouth every 6 (six) hours as needed for nausea or vomiting. 30 tablet 1   Prucalopride Succinate (MOTEGRITY) 2 MG TABS Take 1 tablet (2 mg total) by mouth daily. 30 tablet 3    No results found for this or any previous visit (from the past 48 hour(s)). No results found.  Review of Systems  Constitutional:  Negative for chills and fever.  HENT:  Negative for hearing loss.   Respiratory:  Negative for cough.   Cardiovascular:  Negative for chest pain and palpitations.  Gastrointestinal:  Negative for abdominal pain, nausea and vomiting.  Genitourinary:  Negative for dysuria and urgency.  Musculoskeletal:  Negative for myalgias and neck pain.  Skin:  Negative for rash.  Neurological:  Negative for dizziness and headaches.  Hematological:  Does not bruise/bleed easily.  Psychiatric/Behavioral:  Negative for suicidal ideas.    Blood pressure 117/74, pulse 78, temperature 98.1  F (36.7 C), temperature source Oral, resp. rate 16, height 4\' 8"  (1.422 m), weight 70.8 kg, last menstrual period 10/31/2020, SpO2 99 %. Physical Exam Vitals reviewed.  Constitutional:      Appearance: She is well-developed.  HENT:     Head: Normocephalic and atraumatic.  Eyes:     Conjunctiva/sclera: Conjunctivae normal.     Pupils: Pupils are equal, round, and reactive to light.  Cardiovascular:     Rate and Rhythm: Normal rate and regular rhythm.  Pulmonary:     Effort: Pulmonary effort is normal.     Breath sounds: Normal breath sounds.  Abdominal:     General: Bowel sounds are normal. There is no distension.     Palpations: Abdomen is soft.     Tenderness: There is no abdominal tenderness.  Musculoskeletal:        General: Normal range of motion.      Cervical back: Normal range of motion and neck supple.  Skin:    General: Skin is warm and dry.  Neurological:     Mental Status: She is alert and oriented to person, place, and time.  Psychiatric:        Behavior: Behavior normal.     Assessment/Plan 35 yo female with appendix with mass -lap appendetomy -ERAS protocol -planned outpatient procedure  Mickeal Skinner, MD 11/10/2020, 3:42 PM

## 2020-11-10 NOTE — Anesthesia Preprocedure Evaluation (Signed)
Anesthesia Evaluation  Patient identified by MRN, date of birth, ID band Patient awake    Reviewed: Allergy & Precautions, NPO status , Patient's Chart, lab work & pertinent test results  History of Anesthesia Complications Negative for: history of anesthetic complications  Airway Mallampati: I  TM Distance: >3 FB Neck ROM: Full    Dental  (+) Dental Advisory Given, Teeth Intact   Pulmonary neg pulmonary ROS,    Pulmonary exam normal        Cardiovascular negative cardio ROS Normal cardiovascular exam     Neuro/Psych negative neurological ROS     GI/Hepatic Neg liver ROS, GERD  ,  Endo/Other  negative endocrine ROS  Renal/GU negative Renal ROS  negative genitourinary   Musculoskeletal negative musculoskeletal ROS (+)   Abdominal   Peds  Hematology negative hematology ROS (+)   Anesthesia Other Findings   Reproductive/Obstetrics                             Anesthesia Physical Anesthesia Plan  ASA: 2  Anesthesia Plan: General   Post-op Pain Management:    Induction: Intravenous  PONV Risk Score and Plan: 4 or greater and Ondansetron, Dexamethasone, Treatment may vary due to age or medical condition and Midazolam  Airway Management Planned: Oral ETT  Additional Equipment: None  Intra-op Plan:   Post-operative Plan: Extubation in OR  Informed Consent: I have reviewed the patients History and Physical, chart, labs and discussed the procedure including the risks, benefits and alternatives for the proposed anesthesia with the patient or authorized representative who has indicated his/her understanding and acceptance.     Dental advisory given  Plan Discussed with:   Anesthesia Plan Comments:         Anesthesia Quick Evaluation

## 2020-11-10 NOTE — Transfer of Care (Signed)
Immediate Anesthesia Transfer of Care Note  Patient: Lydia [name]  Procedure(s) Performed: LAPAROSCOPIC APPENDECTOMY  Patient Location: PACU  Anesthesia Type:General  Level of Consciousness: awake, drowsy and patient cooperative  Airway & Oxygen Therapy: Patient Spontanous Breathing and Patient connected to face mask oxygen  Post-op Assessment: Report given to RN and Post -op Vital signs reviewed and stable  Post vital signs: Reviewed and stable  Last Vitals:  Vitals Value Taken Time  BP 125/72 11/10/20 1749  Temp    Pulse 114 11/10/20 1751  Resp 17 11/10/20 1751  SpO2 100 % 11/10/20 1751  Vitals shown include unvalidated device data.  Last Pain:  Vitals:   11/10/20 1325  TempSrc:   PainSc: 0-No pain      Patients Stated Pain Goal: 3 (61/51/83 4373)  Complications: No notable events documented.

## 2020-11-10 NOTE — Op Note (Signed)
OPERATIVE NOTE  Preoperative diagnosis: concern for appendiceal mass  Postoperative diagnosis: mass of sigmoid mesentery   Procedure: laparoscopic resection of mesenteric mass  Surgeon: Gurney Maxin, MD  Asst: Lucas Mallow, MD MPH - resident, assisting  Anesthesia: General/local  Indications for procedure: Lydia [name] is a 35 y.o. female who has presented with abdominal pain and has undergone upper and lower endoscopy biopsies, and trials of pantoprazole and motility agents without relief. Magnetic resonance imaging demonstrated a 3.9 cm mass, described as appendiceal in origin. She presents today for diagnostic laparoscopy and possible resection.  Description of procedure: The patient was identified in the holding area and brought into the operative suite and placed supine. General endotracheal anesthesia was administered and her left arm was tucked. All pressure points were offloaded by foam padding. The patient was prepped and draped in the usual sterile fashion.  The abdomen was entered using the 33mm OptiView trocar in the left upper quadrant just inferior to the left costal margin. Pneumoperitoneum was applied with high flow low pressure insufflation without any hemodynamic changes. No injury on entry was noted. A transverse incision was made superior to the umbilicus through her prior scar and a 78mm trocar placed. An additional 16mm trocar was placed in the suprapubic space. A transversus abdominal block was placed on the left and right sides. Next, the patient was placed in trendelenberg, rotated to the left. The omentum was retracted cephalad. The cecum and appendix were identified. At this point in the operation, we noted that the appendix appeared normal, without erythema, inflammation, engorgement, or discoloration. A four quadrant inspection of the abdomen was noted, and the normal appendix, along with normal left and right ovaries and normal terminal ileum, were photo  documented. We turned our attention to a mass consistent with the description on the radiological interpretation of the patient's axial imaging, and with the appearance of a well circumscribed, erythematous mass arising from the sigmoid mesentery with a relatively narrow base. The mesentery was scored using the harmonic scalpel then, with the mass retracted towards the anterior abdominal wall, fully excised at the base. No bleeding or lymphatic drainage was noted, and a four quadrant inspection revealed no other abnormalities.   The mass was placed in a specimen bag, and the umbilical incision enlarged to safely extract it without crushing it via the 67mm trocar site. Pneumoperitoneum was evacuated. An 0 Vicryl suture was used to close the fascial defect, and all remaining trocars were removed. All incisions were closed with 4-0 Biosyn subcuticular stitch. The patient woke from anesthesia and was brought to PACU in stable condition. All sponge and needle counts were correct x2.  Findings: Normal appendix, normal ovaries bilaterally, normal cecum, normal terminal ileum. Smooth well circumscribed arising from the sigmoid mesentery.  Specimen: Abdominal mass (as above)  Blood loss: 5 ml  Local anesthesia: 20 ml Marcaine  Complications: none  Images:   Gurney Maxin, M.D. General, Bariatric, & Minimally Invasive Surgery [facility]

## 2020-11-10 NOTE — Anesthesia Procedure Notes (Signed)
Procedure Name: Intubation Date/Time: 11/10/2020 4:27 PM Performed by: Niel Hummer, CRNA Pre-anesthesia Checklist: Emergency Drugs available, Patient identified, Suction available and Patient being monitored Patient Re-evaluated:Patient Re-evaluated prior to induction Oxygen Delivery Method: Circle system utilized Preoxygenation: Pre-oxygenation with 100% oxygen Induction Type: IV induction, Rapid sequence and Cricoid Pressure applied Laryngoscope Size: Mac and 3 Grade View: Grade I Tube type: Oral Tube size: 7.0 mm Number of attempts: 1 Airway Equipment and Method: Stylet Placement Confirmation: ETT inserted through vocal cords under direct vision, positive ETCO2 and breath sounds checked- equal and bilateral Secured at: 21 cm Tube secured with: Tape Dental Injury: Teeth and Oropharynx as per pre-operative assessment

## 2020-11-11 ENCOUNTER — Encounter (HOSPITAL_COMMUNITY): Payer: Self-pay | Admitting: General Surgery

## 2020-11-15 ENCOUNTER — Encounter: Payer: Self-pay | Admitting: Family Medicine

## 2020-11-15 LAB — SURGICAL PATHOLOGY

## 2020-11-22 ENCOUNTER — Other Ambulatory Visit: Payer: Self-pay

## 2020-11-22 ENCOUNTER — Encounter (HOSPITAL_COMMUNITY): Payer: Self-pay

## 2020-11-22 ENCOUNTER — Ambulatory Visit (HOSPITAL_COMMUNITY)
Admission: EM | Admit: 2020-11-22 | Discharge: 2020-11-22 | Disposition: A | Discharge status: Home / Self Care | Payer: Medicaid Other | Attending: Medical Oncology | Admitting: Medical Oncology

## 2020-11-22 DIAGNOSIS — L509 Urticaria, unspecified: Secondary | ICD-10-CM | POA: Diagnosis present

## 2020-11-22 DIAGNOSIS — Z9889 Other specified postprocedural states: Secondary | ICD-10-CM | POA: Diagnosis present

## 2020-11-22 LAB — HEPATIC FUNCTION PANEL
ALT: 10 U/L (ref 0–44)
AST: 15 U/L (ref 15–41)
Albumin: 3.9 g/dL (ref 3.5–5.0)
Alkaline Phosphatase: 41 U/L (ref 38–126)
Bilirubin, Direct: <0.1 mg/dL (ref 0.0–0.2)
Total Bilirubin: 0.5 mg/dL (ref 0.3–1.2)
Total Protein: 7.2 g/dL (ref 6.5–8.1)

## 2020-11-22 MED ORDER — FAMOTIDINE 20 MG PO TABS
40.0000 mg | ORAL_TABLET | Freq: Every day | ORAL | Status: DC
  Administered 2020-11-22: 40 mg via ORAL

## 2020-11-22 MED ORDER — FAMOTIDINE 20 MG PO TABS: 20.0000 mg | ORAL_TABLET | Freq: Two times a day (BID) | ORAL | 0 refills | Status: DC

## 2020-11-22 MED ORDER — CETIRIZINE HCL 10 MG PO TABS: 10.0000 mg | ORAL_TABLET | Freq: Every day | ORAL | 0 refills | Status: DC

## 2020-11-22 MED ORDER — FAMOTIDINE 20 MG PO TABS
ORAL_TABLET | ORAL | Status: AC
  Filled 2020-11-22: qty 2

## 2020-11-22 MED ORDER — PREDNISONE 10 MG (21) PO TBPK: ORAL_TABLET | Freq: Every day | ORAL | 0 refills | Status: DC

## 2020-11-22 NOTE — ED Triage Notes (Signed)
Pt in with c/o rash to left lower leg that started yesterday  Pt states she has been very itchy and the rash is intermittent  Pt has been taking benadryl with some relief

## 2020-11-22 NOTE — ED Provider Notes (Addendum)
[place]    CSN: [id number] Arrival date & time: 11/22/20  1531      History   Chief Complaint Chief Complaint  Patient presents with   Rash    HPI Lydia [name] is a 35 y.o. female.   HPI  Rash: Patient reports that yesterday she went out in the grass to play with her child.  Shortly after she had itching of her feet.  She reports that she washed her feet off and then started noticing itching of her hands.  She used Benadryl cream which helped and later used a dose of Benadryl at night before she went to bed.  She states that today her symptoms continued and then she developed hives of her arms so she took more Benadryl.  She reports no trouble breathing, swallowing, lip swelling, tongue swelling, GI symptoms.  Her last dose of Benadryl was around 3 PM.  She states that the hives and most of the itching have resolved with the Benadryl.  She denies any new medications or products.  She does report that 12 days ago she had her appendix removed.  She denies any current abdominal pain.  Past Medical History:  Diagnosis Date   Allergy    Anxiety    Chronic constipation    Motegrity helps    Chronic headaches    none x 2-3 yrs   GERD (gastroesophageal reflux disease)    Ileitis    Infection    UTI   Meningitis    Ovarian cyst     Patient Active Problem List   Diagnosis Date Noted   Acute mastitis 08/08/2015   NSVD (normal spontaneous vaginal delivery) 08/03/2015   Indication for care in labor and delivery, antepartum 08/02/2015   Family history of congenital anomalies    Dyspareunia, female 03/11/2014   Acute recurrent maxillary sinusitis 03/11/2014   Abdominal pain, epigastric 01/26/2014   Tinea versicolor 12/08/2013   Pain 12/08/2013   Unspecified inflammatory disease of female pelvic organs and tissues 12/08/2013   Pelvic pain in female 07/06/2013   Dysmenorrhea 10/21/2012   Unspecified symptom associated with female genital organs 39/76/7341    Follicular cyst of ovary 10/07/2012   Other and unspecified ovarian cyst 10/07/2012   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   HEADACHE 11/30/2007    Past Surgical History:  Procedure Laterality Date   CHOLECYSTECTOMY  2016   COLONOSCOPY     LAPAROSCOPIC APPENDECTOMY N/A 11/10/2020   Procedure: LAPAROSCOPIC APPENDECTOMY;  Surgeon: Mickeal Skinner, MD;  Location: [facility];  Service: General;  Laterality: N/A;   UPPER GASTROINTESTINAL ENDOSCOPY  2015   WISDOM TOOTH EXTRACTION      OB History     Gravida  4   Para  3   Term  3   Preterm  0   AB  1   Living  3      SAB  1   IAB  0   Ectopic  0   Multiple  0   Live Births  3            Home Medications    Prior to Admission medications   Medication Sig Start Date End Date Taking? Authorizing Provider  cetirizine (ZYRTEC ALLERGY) 10 MG tablet Take 1 tablet (10 mg total) by mouth at bedtime. 11/22/20  Yes Ameyah Bangura M, PA-C  famotidine (PEPCID) 20 MG tablet Take 1 tablet (20 mg total) by mouth 2 (two) times daily. 11/22/20  Yes Hughie Closs, PA-C  predniSONE (STERAPRED UNI-PAK 21 TAB) 10 MG (21) TBPK tablet Take by mouth daily. Take 6 tabs by mouth daily  for 2 days, then 5 tabs for 2 days, then 4 tabs for 2 days, then 3 tabs for 2 days, 2 tabs for 2 days, then 1 tab by mouth daily for 2 days 11/22/20  Yes Cameo Schmiesing M, PA-C  acetaminophen (TYLENOL) 500 MG tablet Take 1,000 mg by mouth every 8 (eight) hours as needed.    [provider]  hydrOXYzine (VISTARIL) 25 MG capsule Take 25 mg by mouth 2 (two) times daily as needed. 01/16/20   [provider]  ibuprofen (ADVIL) 800 MG tablet Take 1 tablet (800 mg total) by mouth every 8 (eight) hours as needed. 11/10/20   Kinsinger, Arta Bruce, MD  JUNEL 1.5/30 1.5-30 MG-MCG tablet Take one tablet po daily. 06/29/20   Shelly Bombard, MD  JUNEL FE 1.5/30 1.5-30 MG-MCG tablet Take 1 tablet by mouth daily. 07/26/20   [provider]  ondansetron  (ZOFRAN) 4 MG tablet Take 1 tablet (4 mg total) by mouth every 6 (six) hours as needed for nausea or vomiting. 08/01/20   Levin Erp, PA  oxyCODONE (OXY IR/ROXICODONE) 5 MG immediate release tablet Take 1 tablet (5 mg total) by mouth every 6 (six) hours as needed for severe pain. 11/10/20   Kinsinger, Arta Bruce, MD  Prucalopride Succinate (MOTEGRITY) 2 MG TABS Take 1 tablet (2 mg total) by mouth daily. 06/27/20   Pyrtle, Lajuan Lines, MD    Family History Family History  Problem Relation Age of Onset   Hypertension Mother    Asthma Father    Breast cancer Maternal Aunt 52   Birth defects Daughter        Limb reduction defect   Colon cancer Neg Hx    Esophageal cancer Neg Hx    Rectal cancer Neg Hx    Stomach cancer Neg Hx    Colon polyps Neg Hx     Social History Social History   Tobacco Use   Smoking status: Never   Smokeless tobacco: Never  Vaping Use   Vaping Use: Never used  Substance Use Topics   Alcohol use: No    Alcohol/week: 0.0 standard drinks   Drug use: No     Allergies   Penicillins   Review of Systems Review of Systems  As stated above in HPI Physical Exam Triage Vital Signs ED Triage Vitals  Enc Vitals Group     BP 11/22/20 1648 106/73     Pulse Rate 11/22/20 1648 67     Resp 11/22/20 1648 16     Temp 11/22/20 1648 98.4 F (36.9 C)     Temp Source 11/22/20 1648 Oral     SpO2 11/22/20 1648 100 %     Weight --      Height --      Head Circumference --      Peak Flow --      Pain Score 11/22/20 1647 0     Pain Loc --      Pain Edu? --      Excl. in [place]? --    No data found.  Updated Vital Signs BP 106/73 (BP Location: Right Arm)   Pulse 67   Temp 98.4 F (36.9 C) (Oral)   Resp 16   LMP 10/31/2020   SpO2 100%    Physical Exam Vitals and nursing note reviewed.  Constitutional:  General: She is not in acute distress.    Appearance: Normal appearance. She is not ill-appearing, toxic-appearing or diaphoretic.  Cardiovascular:      Rate and Rhythm: Normal rate and regular rhythm.     Heart sounds: Normal heart sounds.  Pulmonary:     Effort: Pulmonary effort is normal.     Breath sounds: Normal breath sounds.  Abdominal:     General: There is no distension.     Palpations: Abdomen is soft.     Tenderness: There is no abdominal tenderness. There is no guarding.  Skin:    Coloration: Skin is not jaundiced.     Findings: No rash.  Neurological:     Mental Status: She is alert and oriented to person, place, and time.     UC Treatments / Results  Labs (all labs ordered are listed, but only abnormal results are displayed) Labs Reviewed  HEPATIC FUNCTION PANEL    EKG   Radiology No results found.  Procedures Procedures (including critical care time)  Medications Ordered in UC Medications  famotidine (PEPCID) tablet 40 mg (has no administration in time range)    Initial Impression / Assessment and Plan / UC Course  I have reviewed the triage vital signs and the nursing notes.  Pertinent labs & imaging results that were available during my care of the patient were reviewed by me and considered in my medical decision making (see chart for details).     New.  We are going to treat for an allergic reaction while also screening for any hepatic abnormalities given her recent abdominal surgery and current itching to rule out any liver complication to avoid systemic complication.  We are getting give her Pepcid in office today.  We are going to try to hold off on the steroids if possible given her recent surgery.  I Minna send her home with prednisone for her to use if needed if the Pepcid, Zyrtec does not help with her symptoms.  Discussed how to take all these medications along with common potential side effects and precautions.  Discussed red flag signs and symptoms. Final Clinical Impressions(s) / UC Diagnoses   Final diagnoses:  Hives  Recent major surgery   Discharge Instructions   None    ED  Prescriptions     Medication Sig Dispense Auth. Provider   predniSONE (STERAPRED UNI-PAK 21 TAB) 10 MG (21) TBPK tablet Take by mouth daily. Take 6 tabs by mouth daily  for 2 days, then 5 tabs for 2 days, then 4 tabs for 2 days, then 3 tabs for 2 days, 2 tabs for 2 days, then 1 tab by mouth daily for 2 days 42 tablet Zayaan Kozak M, PA-C   famotidine (PEPCID) 20 MG tablet Take 1 tablet (20 mg total) by mouth 2 (two) times daily. 30 tablet Karan Ramnauth M, PA-C   cetirizine (ZYRTEC ALLERGY) 10 MG tablet Take 1 tablet (10 mg total) by mouth at bedtime. 14 tablet Hughie Closs, [place]      PDMP not reviewed this encounter.   Hughie Closs, PA-C 11/22/20 1830    Hughie Closs, PA-C 11/22/20 1830

## 2020-12-01 ENCOUNTER — Encounter: Payer: Medicaid Other | Admitting: Physical Medicine and Rehabilitation

## 2020-12-05 ENCOUNTER — Ambulatory Visit: Payer: Medicaid Other | Admitting: Obstetrics

## 2020-12-13 ENCOUNTER — Ambulatory Visit: Payer: Medicaid Other | Admitting: Obstetrics and Gynecology

## 2020-12-13 ENCOUNTER — Other Ambulatory Visit: Payer: Self-pay

## 2020-12-13 ENCOUNTER — Encounter: Payer: Self-pay | Admitting: Obstetrics and Gynecology

## 2020-12-13 VITALS — BP 110/75 | HR 82 | Ht 59.0 in | Wt 159.0 lb

## 2020-12-13 DIAGNOSIS — Z0000 Encounter for general adult medical examination without abnormal findings: Secondary | ICD-10-CM | POA: Insufficient documentation

## 2020-12-13 DIAGNOSIS — Z833 Family history of diabetes mellitus: Secondary | ICD-10-CM

## 2020-12-13 DIAGNOSIS — Z5329 Procedure and treatment not carried out because of patient's decision for other reasons: Secondary | ICD-10-CM | POA: Insufficient documentation

## 2020-12-13 DIAGNOSIS — Z91199 Patient's noncompliance with other medical treatment and regimen due to unspecified reason: Secondary | ICD-10-CM

## 2020-12-13 NOTE — Progress Notes (Signed)
Pt was not seen today.

## 2020-12-13 NOTE — Progress Notes (Addendum)
GYN presents for treatment of pelvic pain.  C/o abdominal pain 4-5/10 x 5 years. US done 08/09/20.  Last PAP 06/29/2020

## 2020-12-22 ENCOUNTER — Other Ambulatory Visit: Payer: Self-pay

## 2020-12-22 ENCOUNTER — Ambulatory Visit (INDEPENDENT_AMBULATORY_CARE_PROVIDER_SITE_OTHER): Payer: Medicaid Other | Admitting: Obstetrics & Gynecology

## 2020-12-22 DIAGNOSIS — K388 Other specified diseases of appendix: Secondary | ICD-10-CM

## 2020-12-22 HISTORY — DX: Other specified diseases of appendix: K38.8

## 2020-12-22 NOTE — Progress Notes (Signed)
Pt is in office today for follow up from recent surgeries. Pt has had recent appendectomy and removal of Leiomyoma.

## 2020-12-22 NOTE — Progress Notes (Signed)
Patient ID: Lydia [name], female   DOB: December 13, 1985, 35 y.o.   MRN: [id number] Subjective:     Lydia [name] is a 35 y.o. female who presents to the clinic 7 weeks status post laparoscopy and appendectomy  for  appendiceal mass . Eating a regular diet without difficulty. Bowel movements are normal. Pain is controlled without any medications.  The following portions of the patient's history were reviewed and updated as appropriate: allergies, current medications, past family history, past medical history, past social history, past surgical history, and problem list.  Review of Systems Pertinent items are noted in HPI.    Objective:    BP 125/82   Pulse 73   Wt 160 lb (72.6 kg)   LMP 12/04/2020 (Approximate)   BMI 32.32 kg/m  General:  alert, cooperative, and no distress  Abdomen: Not distended  Incision:   Not indicated     Assessment:    Doing well postoperatively. 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 Pathology report discussed.    Plan:    1. Continue any current medications. 2. Wound care discussed. 3. Activity restrictions: none 4. Anticipated return to work: not applicable. 5. Follow up: with general surgery for postoperative issue, pap test is up to date, continue OCP .   Woodroe Mode, MD 12/22/2020 12:20 PM

## 2020-12-28 ENCOUNTER — Encounter (HOSPITAL_BASED_OUTPATIENT_CLINIC_OR_DEPARTMENT_OTHER): Payer: Self-pay

## 2020-12-28 ENCOUNTER — Ambulatory Visit (HOSPITAL_BASED_OUTPATIENT_CLINIC_OR_DEPARTMENT_OTHER): Payer: Medicaid Other | Admitting: Nurse Practitioner

## 2021-01-03 ENCOUNTER — Other Ambulatory Visit: Payer: Self-pay | Admitting: General Surgery

## 2021-01-03 ENCOUNTER — Ambulatory Visit (INDEPENDENT_AMBULATORY_CARE_PROVIDER_SITE_OTHER): Payer: Medicaid Other | Admitting: Podiatry

## 2021-01-03 ENCOUNTER — Other Ambulatory Visit: Payer: Self-pay

## 2021-01-03 DIAGNOSIS — S91202A Unspecified open wound of left great toe with damage to nail, initial encounter: Secondary | ICD-10-CM

## 2021-01-03 DIAGNOSIS — R1031 Right lower quadrant pain: Secondary | ICD-10-CM

## 2021-01-03 NOTE — Progress Notes (Signed)
   HPI: 35 y.o. female presenting today as a new patient for evaluation of an injury to the left great toe and nail plate.  Patient states that on Sunday, 12/31/2020, she injured her toe at home and noticed bleeding to the distal tip of the toe.  She presents today for follow-up treatment and evaluation.  She wants to ensure that there is no infection or treatment needed  Past Medical History:  Diagnosis Date   Allergy    Anxiety    Chronic constipation    Motegrity helps    Chronic headaches    none x 2-3 yrs   GERD (gastroesophageal reflux disease)    Ileitis    Infection    UTI   Meningitis    Ovarian cyst      Physical Exam: General: The patient is alert and oriented x3 in no acute distress.  Dermatology: There is some mild bruising to the left great toe and underlying nail plate.  No clearly identifiable subungual hematoma noted.  The nail plate is overall intact and well adhered to the underlying nailbed  Vascular: Palpable pedal pulses bilaterally. No edema or erythema noted. Capillary refill within normal limits.  Neurological: Epicritic and protective threshold grossly intact bilaterally.   Musculoskeletal Exam: No pedal deformities noted  Assessment: 1.  Contusion/trauma left hallux nail plate   Plan of Care:  1. Patient evaluated. 2.  Reassured the patient that there is no clinical evidence of an infection.  Recommend conservative treatment and simple observation to allow the nail to grow out 3.  I did tell the patient that there is a possibility that the toenail plate may fall off and a new nail should grow in.  Potentially without any complications. 4.  Return to clinic as needed      Edrick Kins, DPM [facility]  Dr. Edrick Kins, DPM    [address], [place] [address]                Office [phone]  Fax [phone]

## 2021-01-23 ENCOUNTER — Ambulatory Visit
Admission: RE | Admit: 2021-01-23 | Discharge: 2021-01-23 | Disposition: A | Discharge status: Home / Self Care | Payer: Medicaid Other | Source: Ambulatory Visit | Attending: General Surgery | Admitting: General Surgery

## 2021-01-23 ENCOUNTER — Other Ambulatory Visit: Payer: Self-pay

## 2021-01-23 DIAGNOSIS — R1031 Right lower quadrant pain: Secondary | ICD-10-CM

## 2021-01-23 MED ORDER — IOPAMIDOL (ISOVUE-300) INJECTION 61%
100.0000 mL | Freq: Once | INTRAVENOUS | Status: AC | PRN
  Administered 2021-01-23: 100 mL via INTRAVENOUS

## 2021-02-09 ENCOUNTER — Other Ambulatory Visit: Payer: Self-pay

## 2021-02-09 ENCOUNTER — Ambulatory Visit (INDEPENDENT_AMBULATORY_CARE_PROVIDER_SITE_OTHER): Payer: Medicaid Other | Admitting: Physical Medicine and Rehabilitation

## 2021-02-09 ENCOUNTER — Ambulatory Visit (HOSPITAL_BASED_OUTPATIENT_CLINIC_OR_DEPARTMENT_OTHER): Payer: Medicaid Other | Admitting: Nurse Practitioner

## 2021-02-09 DIAGNOSIS — R202 Paresthesia of skin: Secondary | ICD-10-CM

## 2021-02-09 NOTE — Progress Notes (Signed)
Lydia [name] - 35 y.o. female MRN [id number]  Date of birth: 05/08/1986  Office Visit Note: Visit Date: 02/09/2021 PCP: Pcp, No Referred by: Melbourne Abts, FNP  Subjective: Chief Complaint  Patient presents with   Right Hand - Numbness, Pain   Left Hand - Numbness, Pain   HPI:  Lydia [name] is a 35 y.o. female who comes in today at the request of Dr. Eunice Blase for electrodiagnostic study of the Bilateral upper extremities.  Patient is Right hand dominant.  Symptoms started about 3 months ago.  She began waking up with a heavy feeling in both of her hands.  It has progressed to the point that symptoms radiate up to the elbows.  She gets tingling in her fingers at times.   Index finger on right hand and bilateral thumbs are worse.   ROS Otherwise per HPI.  Assessment & Plan: Visit Diagnoses:    ICD-10-CM   1. Paresthesia of skin  R20.2 NCV with EMG (electromyography)      Plan: Impression: Essentially NORMAL electrodiagnostic study of both upper limbs.  There is no significant electrodiagnostic evidence of nerve entrapment, brachial plexopathy or cervical radiculopathy.    As you know, purely sensory or demyelinating radiculopathies and chemical radiculitis may not be detected with this particular electrodiagnostic study.  Recommendations: 1.  Follow-up with referring physician. 2.  Continue current management of symptoms.  Meds & Orders: No orders of the defined types were placed in this encounter.   Orders Placed This Encounter  Procedures   NCV with EMG (electromyography)    Follow-up: Return in about 2 weeks (around 02/23/2021) for G. Alphonzo Severance, MD.   Procedures: No procedures performed  EMG & NCV Findings: All nerve conduction studies (as indicated in the following tables) were within normal limits.  All left vs. right side differences were within normal limits.    All examined muscles (as indicated in the following table) showed no evidence of electrical  instability.    Impression: Essentially NORMAL electrodiagnostic study of both upper limbs.  There is no significant electrodiagnostic evidence of nerve entrapment, brachial plexopathy or cervical radiculopathy.    As you know, purely sensory or demyelinating radiculopathies and chemical radiculitis may not be detected with this particular electrodiagnostic study.  Recommendations: 1.  Follow-up with referring physician. 2.  Continue current management of symptoms.  ___________________________ Laurence Spates FAAPMR Board Certified, American Board of Physical Medicine and Rehabilitation    Nerve Conduction Studies Anti Sensory Summary Table   Stim Site NR Peak (ms) Norm Peak (ms) P-T Amp (V) Norm P-T Amp Site1 Site2 Delta-P (ms) Dist (cm) Vel (m/s) Norm Vel (m/s)  Left Median Acr Palm Anti Sensory (2nd Digit)  29.2C  Wrist    2.9 <3.6 45.9 >10 Wrist Palm 1.4 0.0    Palm    1.5 <2.0 77.4         Right Median Acr Palm Anti Sensory (2nd Digit)  29.5C  Wrist    2.9 <3.6 48.6 >10 Wrist Palm 1.3 0.0    Palm    1.6 <2.0 67.1         Left Radial Anti Sensory (Base 1st Digit)  30.5C  Wrist    1.9 <3.1 49.0  Wrist Base 1st Digit 1.9 0.0    Right Radial Anti Sensory (Base 1st Digit)  30.1C  Wrist    2.0 <3.1 48.0  Wrist Base 1st Digit 2.0 0.0    Left Ulnar Anti Sensory (5th  Digit)  30.4C  Wrist    3.0 <3.7 50.6 >15.0 Wrist 5th Digit 3.0 14.0 47 >38  Right Ulnar Anti Sensory (5th Digit)  30.3C  Wrist    3.0 <3.7 51.4 >15.0 Wrist 5th Digit 3.0 14.0 47 >38   Motor Summary Table   Stim Site NR Onset (ms) Norm Onset (ms) O-P Amp (mV) Norm O-P Amp Site1 Site2 Delta-0 (ms) Dist (cm) Vel (m/s) Norm Vel (m/s)  Left Median Motor (Abd Poll Brev)  30.8C  Wrist    3.1 <4.2 5.5 >5 Elbow Wrist 3.2 17.0 53 >50  Elbow    6.3  8.2         Right Median Motor (Abd Poll Brev)  30.3C  Wrist    3.0 <4.2 6.7 >5 Elbow Wrist 3.4 17.0 50 >50  Elbow    6.4  6.2         Left Ulnar Motor (Abd Dig Min)   31.1C  Wrist    2.3 <4.2 8.2 >3 B Elbow Wrist 2.8 17.0 61 >53  B Elbow    5.1  7.5  A Elbow B Elbow 1.0 9.0 90 >53  A Elbow    6.1  7.4         Right Ulnar Motor (Abd Dig Min)  30.5C  Wrist    2.6 <4.2 8.7 >3 B Elbow Wrist 2.8 18.0 64 >53  B Elbow    5.4  8.8  A Elbow B Elbow 1.2 9.0 75 >53  A Elbow    6.6  9.0          EMG   Side Muscle Nerve Root Ins Act Fibs Psw Amp Dur Poly Recrt Int Fraser Din Comment  Right Abd Poll Brev Median C8-T1 Nml Nml Nml Nml Nml 0 Nml Nml   Right 1stDorInt Ulnar C8-T1 Nml Nml Nml Nml Nml 0 Nml Nml   Right PronatorTeres Median C6-7 Nml Nml Nml Nml Nml 0 Nml Nml   Right Biceps Musculocut C5-6 Nml Nml Nml Nml Nml 0 Nml Nml   Right Deltoid Axillary C5-6 Nml Nml Nml Nml Nml 0 Nml Nml     Nerve Conduction Studies Anti Sensory Left/Right Comparison   Stim Site L Lat (ms) R Lat (ms) L-R Lat (ms) L Amp (V) R Amp (V) L-R Amp (%) Site1 Site2 L Vel (m/s) R Vel (m/s) L-R Vel (m/s)  Median Acr Palm Anti Sensory (2nd Digit)  29.2C  Wrist 2.9 2.9 0.0 45.9 48.6 5.6 Wrist Palm     Palm 1.5 1.6 0.1 77.4 67.1 13.3       Radial Anti Sensory (Base 1st Digit)  30.5C  Wrist 1.9 2.0 0.1 49.0 48.0 2.0 Wrist Base 1st Digit     Ulnar Anti Sensory (5th Digit)  30.4C  Wrist 3.0 3.0 0.0 50.6 51.4 1.6 Wrist 5th Digit 47 47 0   Motor Left/Right Comparison   Stim Site L Lat (ms) R Lat (ms) L-R Lat (ms) L Amp (mV) R Amp (mV) L-R Amp (%) Site1 Site2 L Vel (m/s) R Vel (m/s) L-R Vel (m/s)  Median Motor (Abd Poll Brev)  30.8C  Wrist 3.1 3.0 0.1 5.5 6.7 17.9 Elbow Wrist 53 50 3  Elbow 6.3 6.4 0.1 8.2 6.2 24.4       Ulnar Motor (Abd Dig Min)  31.1C  Wrist 2.3 2.6 0.3 8.2 8.7 5.7 B Elbow Wrist 61 64 3  B Elbow 5.1 5.4 0.3 7.5 8.8 14.8 A Elbow B Elbow 90 75 15  A Elbow 6.1 6.6 0.5 7.4 9.0 17.8          Waveforms:                     Clinical History: No specialty comments available.     Objective:  VS:  HT:    WT:   BMI:     BP:   HR: bpm  TEMP: ( )  RESP:   Physical Exam Musculoskeletal:        General: No swelling, tenderness or deformity.     Comments: Inspection reveals no atrophy of the bilateral APB or FDI or hand intrinsics. There is no swelling, color changes, allodynia or dystrophic changes. There is 5 out of 5 strength in the bilateral wrist extension, finger abduction and long finger flexion. There is intact sensation to light touch in all dermatomal and peripheral nerve distributions. There is a negative Hoffmann's test bilaterally.  Skin:    General: Skin is warm and dry.     Findings: No erythema or rash.  Neurological:     General: No focal deficit present.     Mental Status: She is alert and oriented to person, place, and time.     Motor: No weakness or abnormal muscle tone.     Coordination: Coordination normal.  Psychiatric:        Mood and Affect: Mood normal.        Behavior: Behavior normal.     Imaging: No results found.

## 2021-02-09 NOTE — Progress Notes (Signed)
Bilateral hand pain and numbness. Index finger on right hand and bilateral thumbs are worse.  Right hand dominant No lotion per patient

## 2021-02-12 NOTE — Procedures (Signed)
EMG & NCV Findings: All nerve conduction studies (as indicated in the following tables) were within normal limits.  All left vs. right side differences were within normal limits.    All examined muscles (as indicated in the following table) showed no evidence of electrical instability.    Impression: Essentially NORMAL electrodiagnostic study of both upper limbs.  There is no significant electrodiagnostic evidence of nerve entrapment, brachial plexopathy or cervical radiculopathy.    As you know, purely sensory or demyelinating radiculopathies and chemical radiculitis may not be detected with this particular electrodiagnostic study.  Recommendations: 1.  Follow-up with referring physician. 2.  Continue current management of symptoms.  ___________________________ Laurence Spates FAAPMR Board Certified, American Board of Physical Medicine and Rehabilitation    Nerve Conduction Studies Anti Sensory Summary Table   Stim Site NR Peak (ms) Norm Peak (ms) P-T Amp (V) Norm P-T Amp Site1 Site2 Delta-P (ms) Dist (cm) Vel (m/s) Norm Vel (m/s)  Left Median Acr Palm Anti Sensory (2nd Digit)  29.2C  Wrist    2.9 <3.6 45.9 >10 Wrist Palm 1.4 0.0    Palm    1.5 <2.0 77.4         Right Median Acr Palm Anti Sensory (2nd Digit)  29.5C  Wrist    2.9 <3.6 48.6 >10 Wrist Palm 1.3 0.0    Palm    1.6 <2.0 67.1         Left Radial Anti Sensory (Base 1st Digit)  30.5C  Wrist    1.9 <3.1 49.0  Wrist Base 1st Digit 1.9 0.0    Right Radial Anti Sensory (Base 1st Digit)  30.1C  Wrist    2.0 <3.1 48.0  Wrist Base 1st Digit 2.0 0.0    Left Ulnar Anti Sensory (5th Digit)  30.4C  Wrist    3.0 <3.7 50.6 >15.0 Wrist 5th Digit 3.0 14.0 47 >38  Right Ulnar Anti Sensory (5th Digit)  30.3C  Wrist    3.0 <3.7 51.4 >15.0 Wrist 5th Digit 3.0 14.0 47 >38   Motor Summary Table   Stim Site NR Onset (ms) Norm Onset (ms) O-P Amp (mV) Norm O-P Amp Site1 Site2 Delta-0 (ms) Dist (cm) Vel (m/s) Norm Vel (m/s)  Left Median  Motor (Abd Poll Brev)  30.8C  Wrist    3.1 <4.2 5.5 >5 Elbow Wrist 3.2 17.0 53 >50  Elbow    6.3  8.2         Right Median Motor (Abd Poll Brev)  30.3C  Wrist    3.0 <4.2 6.7 >5 Elbow Wrist 3.4 17.0 50 >50  Elbow    6.4  6.2         Left Ulnar Motor (Abd Dig Min)  31.1C  Wrist    2.3 <4.2 8.2 >3 B Elbow Wrist 2.8 17.0 61 >53  B Elbow    5.1  7.5  A Elbow B Elbow 1.0 9.0 90 >53  A Elbow    6.1  7.4         Right Ulnar Motor (Abd Dig Min)  30.5C  Wrist    2.6 <4.2 8.7 >3 B Elbow Wrist 2.8 18.0 64 >53  B Elbow    5.4  8.8  A Elbow B Elbow 1.2 9.0 75 >53  A Elbow    6.6  9.0          EMG   Side Muscle Nerve Root Ins Act Fibs Psw Amp Dur Poly Recrt Int Fraser Din Comment  Right  Abd Poll Brev Median C8-T1 Nml Nml Nml Nml Nml 0 Nml Nml   Right 1stDorInt Ulnar C8-T1 Nml Nml Nml Nml Nml 0 Nml Nml   Right PronatorTeres Median C6-7 Nml Nml Nml Nml Nml 0 Nml Nml   Right Biceps Musculocut C5-6 Nml Nml Nml Nml Nml 0 Nml Nml   Right Deltoid Axillary C5-6 Nml Nml Nml Nml Nml 0 Nml Nml     Nerve Conduction Studies Anti Sensory Left/Right Comparison   Stim Site L Lat (ms) R Lat (ms) L-R Lat (ms) L Amp (V) R Amp (V) L-R Amp (%) Site1 Site2 L Vel (m/s) R Vel (m/s) L-R Vel (m/s)  Median Acr Palm Anti Sensory (2nd Digit)  29.2C  Wrist 2.9 2.9 0.0 45.9 48.6 5.6 Wrist Palm     Palm 1.5 1.6 0.1 77.4 67.1 13.3       Radial Anti Sensory (Base 1st Digit)  30.5C  Wrist 1.9 2.0 0.1 49.0 48.0 2.0 Wrist Base 1st Digit     Ulnar Anti Sensory (5th Digit)  30.4C  Wrist 3.0 3.0 0.0 50.6 51.4 1.6 Wrist 5th Digit 47 47 0   Motor Left/Right Comparison   Stim Site L Lat (ms) R Lat (ms) L-R Lat (ms) L Amp (mV) R Amp (mV) L-R Amp (%) Site1 Site2 L Vel (m/s) R Vel (m/s) L-R Vel (m/s)  Median Motor (Abd Poll Brev)  30.8C  Wrist 3.1 3.0 0.1 5.5 6.7 17.9 Elbow Wrist 53 50 3  Elbow 6.3 6.4 0.1 8.2 6.2 24.4       Ulnar Motor (Abd Dig Min)  31.1C  Wrist 2.3 2.6 0.3 8.2 8.7 5.7 B Elbow Wrist 61 64 3  B Elbow 5.1 5.4  0.3 7.5 8.8 14.8 A Elbow B Elbow 90 75 15  A Elbow 6.1 6.6 0.5 7.4 9.0 17.8          Waveforms:

## 2021-02-14 ENCOUNTER — Other Ambulatory Visit: Payer: Self-pay | Admitting: Internal Medicine

## 2021-02-21 ENCOUNTER — Telehealth: Payer: Medicaid Other

## 2021-02-23 ENCOUNTER — Other Ambulatory Visit: Payer: Self-pay

## 2021-02-23 ENCOUNTER — Ambulatory Visit (INDEPENDENT_AMBULATORY_CARE_PROVIDER_SITE_OTHER): Payer: Medicaid Other | Admitting: Orthopedic Surgery

## 2021-02-23 ENCOUNTER — Ambulatory Visit: Payer: Self-pay

## 2021-02-23 ENCOUNTER — Encounter: Payer: Self-pay | Admitting: Orthopedic Surgery

## 2021-02-23 VITALS — Ht 59.0 in | Wt 160.0 lb

## 2021-02-23 DIAGNOSIS — M25532 Pain in left wrist: Secondary | ICD-10-CM | POA: Diagnosis not present

## 2021-02-23 NOTE — Progress Notes (Signed)
Office Visit Note   Patient: Lydia [name]           Date of Birth: February 01, 1986           MRN: [id number] Visit Date: 02/23/2021 Requested by: No referring provider defined for this encounter. PCP: Pcp, No  Subjective: Chief Complaint  Patient presents with   Right Hand - Pain   Left Hand - Pain    HPI: Lydia [name] is a 35 year old patient with bilateral hand pain.  Had nerve conduction study done 02/09/2021.  The study was normal.  States that the left hand falls asleep at times.  Reports numbness on the radial aspect of the hand volar side.  Denies any neck pain.  Tried wrist splints without relief.  Has not had injection in the wrist.  Symptoms ongoing for 3 months.  She is right-hand dominant.              ROS: All systems reviewed are negative as they relate to the chief complaint within the history of present illness.  Patient denies  fevers or chills.   Assessment & Plan: Visit Diagnoses:  1. Pain in left wrist     Plan: Impression is left hand pain with numbness and tingling classic for carpal tunnel but no nerve studies confirm the diagnosis.  Ulnar nerve nontender.  Does have asymmetric clicking with manipulation of the left wrist compared to the right which is somewhat painful.  Radiographs normal.  Favor MRI scan of that left hand to rule out any type of atypical cyst or nerve variant that could be symptomatic.  May consider ultrasound-guided injection into the carpal canal at that time.  Follow-Up Instructions: Return for after MRI.   Orders:  Orders Placed This Encounter  Procedures   XR Wrist Complete Left   MR Wrist Left w/o contrast   No orders of the defined types were placed in this encounter.     Procedures: No procedures performed   Clinical Data: No additional findings.  Objective: Vital Signs: Ht 4\' 11"  (1.499 m)   Wt 160 lb (72.6 kg)   BMI 32.32 kg/m   Physical Exam:   Constitutional: Patient appears well-developed HEENT:  Head:  Normocephalic Eyes:EOM are normal Neck: Normal range of motion Cardiovascular: Normal rate Pulmonary/chest: Effort normal Neurologic: Patient is alert Skin: Skin is warm Psychiatric: Patient has normal mood and affect   Ortho Exam: Ortho exam demonstrates full active and passive range of motion of the left wrist.  Has symmetric grip strength bilaterally.  Negative Tinel's and no subluxation of the ulnar nerve at the elbow.  Grip strength intact.  No other masses lymphadenopathy or skin changes noted in that left hand region.  Does have tenderness to carpal tunnel compression.  Negative Finkelstein's.  Negative grind test.  Does have some clicking with subluxation or shucking of the radiocarpal joint.  This is not present on the right-hand side.  Specialty Comments:  No specialty comments available.  Imaging: XR Wrist Complete Left  Result Date: 02/23/2021 AP lateral oblique views left wrist showed no acute fracture.  Scapholunate interval intact.  Slight ulnar negative variance.  No arthritis.  No malalignment.    PMFS History: Patient Active Problem List   Diagnosis Date Noted   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/22/2020   Family history of congenital anomalies    Dyspareunia, female 03/11/2014   Acute recurrent maxillary sinusitis 03/11/2014   Abdominal pain, epigastric 01/26/2014   Tinea versicolor 12/08/2013   Pain 12/08/2013  Pelvic pain in female 07/06/2013   Dysmenorrhea 10/21/2012   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   HEADACHE 11/30/2007   Past Medical History:  Diagnosis Date   Allergy    Anxiety    Chronic constipation    Motegrity helps    Chronic headaches    none x 2-3 yrs   GERD (gastroesophageal reflux disease)    Ileitis    Infection    UTI   Meningitis    Ovarian cyst     Family History  Problem Relation Age of Onset   Hypertension Mother    Asthma Father    Breast cancer Maternal Aunt 42   Birth defects Daughter        Limb reduction defect   Colon cancer Neg Hx     Esophageal cancer Neg Hx    Rectal cancer Neg Hx    Stomach cancer Neg Hx    Colon polyps Neg Hx     Past Surgical History:  Procedure Laterality Date   CHOLECYSTECTOMY  2016   COLONOSCOPY     LAPAROSCOPIC APPENDECTOMY N/A 11/10/2020   Procedure: LAPAROSCOPIC APPENDECTOMY;  Surgeon: Mickeal Skinner, MD;  Location: [facility];  Service: General;  Laterality: N/A;   UPPER GASTROINTESTINAL ENDOSCOPY  2015   WISDOM TOOTH EXTRACTION     Social History   Occupational History   Not on file  Tobacco Use   Smoking status: Never   Smokeless tobacco: Never  Vaping Use   Vaping Use: Never used  Substance and Sexual Activity   Alcohol use: No    Alcohol/week: 0.0 standard drinks   Drug use: No   Sexual activity: Yes    Partners: Male    Birth control/protection: None, Pill

## 2021-02-27 NOTE — Telephone Encounter (Signed)
Ok will follow eval next visit

## 2021-03-13 ENCOUNTER — Telehealth: Payer: Medicaid Other | Admitting: Physician Assistant

## 2021-03-13 DIAGNOSIS — G44209 Tension-type headache, unspecified, not intractable: Secondary | ICD-10-CM

## 2021-03-13 DIAGNOSIS — M542 Cervicalgia: Secondary | ICD-10-CM | POA: Diagnosis not present

## 2021-03-13 MED ORDER — CYCLOBENZAPRINE HCL 10 MG PO TABS: 5.0000 mg | ORAL_TABLET | Freq: Three times a day (TID) | ORAL | 0 refills | Status: DC | PRN

## 2021-03-13 MED ORDER — METHYLPREDNISOLONE 4 MG PO TBPK: ORAL_TABLET | ORAL | 0 refills | Status: DC

## 2021-03-13 NOTE — Progress Notes (Signed)
Virtual Visit Consent   Lydia [name], you are scheduled for a virtual visit with a [facility] provider today.     Just as with appointments in the office, your consent must be obtained to participate.  Your consent will be active for this visit and any virtual visit you may have with one of our providers in the next 365 days.     If you have a MyChart account, a copy of this consent can be sent to you electronically.  All virtual visits are billed to your insurance company just like a traditional visit in the office.    As this is a virtual visit, video technology does not allow for your provider to perform a traditional examination.  This may limit your provider's ability to fully assess your condition.  If your provider identifies any concerns that need to be evaluated in person or the need to arrange testing (such as labs, EKG, etc.), we will make arrangements to do so.     Although advances in technology are sophisticated, we cannot ensure that it will always work on either your end or our end.  If the connection with a video visit is poor, the visit may have to be switched to a telephone visit.  With either a video or telephone visit, we are not always able to ensure that we have a secure connection.     I need to obtain your verbal consent now.   Are you willing to proceed with your visit today?    Lydia [name] has provided verbal consent on 03/13/2021 for a virtual visit (video or telephone).   Mar Daring, PA-C   Date: 03/13/2021 3:10 PM   Virtual Visit via Video Note   I, Mar Daring, connected with  Lydia [name]  ([id number], May 20, 1985) on 03/13/21 at  3:00 PM EDT by a video-enabled telemedicine application and verified that I am speaking with the correct person using two identifiers.  Location: Patient: Virtual Visit Location Patient: Home Provider: home office   I discussed the limitations of evaluation and management by telemedicine and the availability of  in person appointments. The patient expressed understanding and agreed to proceed.    History of Present Illness: Lydia [name] is a 35 y.o. who identifies as a female who was assigned female at birth, and is being seen today for neck pain. Having headaches for the past 3 weeks. Pain is radiating from back of head to neck and down her back to about waist level. Now having photophobia and sensitivity to noise. Some nausea this morning. Tender to touch over temporal region bilaterally. Denies fever, chills, vomiting, URI symptoms. Has been trying tylenol and ibuprofen without relief.   Problems:  Patient Active Problem List   Diagnosis Date Noted   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/22/2020   Family history of congenital anomalies    Dyspareunia, female 03/11/2014   Acute recurrent maxillary sinusitis 03/11/2014   Abdominal pain, epigastric 01/26/2014   Tinea versicolor 12/08/2013   Pain 12/08/2013   Pelvic pain in female 07/06/2013   Dysmenorrhea 10/21/2012   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   HEADACHE 11/30/2007    Allergies:  Allergies  Allergen Reactions   Penicillins Hives    Has patient had a PCN reaction causing immediate rash, facial/tongue/throat swelling, SOB or lightheadedness with hypotension: Yes Has patient had a PCN reaction causing severe rash involving mucus membranes or skin necrosis: Yes Has patient had a PCN reaction that required hospitalization no Has  patient had a PCN reaction occurring within the last 10 years: No If all of the above answers are "NO", then may proceed with Cephalosporin use.   Medications:  Current Outpatient Medications:    cyclobenzaprine (FLEXERIL) 10 MG tablet, Take 0.5-1 tablets (5-10 mg total) by mouth 3 (three) times daily as needed for muscle spasms., Disp: 30 tablet, Rfl: 0   methylPREDNISolone (MEDROL DOSEPAK) 4 MG TBPK tablet, 6 day taper; take as directed on package instructions, Disp: 21 tablet, Rfl: 0   acetaminophen (TYLENOL) 500 MG tablet, Take 1,000  mg by mouth every 8 (eight) hours as needed., Disp: , Rfl:    cetirizine (ZYRTEC ALLERGY) 10 MG tablet, Take 1 tablet (10 mg total) by mouth at bedtime., Disp: 14 tablet, Rfl: 0   famotidine (PEPCID) 20 MG tablet, Take 1 tablet (20 mg total) by mouth 2 (two) times daily., Disp: 30 tablet, Rfl: 0   hydrOXYzine (VISTARIL) 25 MG capsule, Take 25 mg by mouth 2 (two) times daily as needed., Disp: , Rfl:    ibuprofen (ADVIL) 800 MG tablet, Take 1 tablet (800 mg total) by mouth every 8 (eight) hours as needed., Disp: 30 tablet, Rfl: 0   JUNEL 1.5/30 1.5-30 MG-MCG tablet, Take one tablet po daily., Disp: 28 tablet, Rfl: 11   JUNEL FE 1.5/30 1.5-30 MG-MCG tablet, Take 1 tablet by mouth daily., Disp: , Rfl:    MOTEGRITY 2 MG TABS, TAKE 1 TABLET(2 MG) BY MOUTH DAILY, Disp: 30 tablet, Rfl: 3   ondansetron (ZOFRAN) 4 MG tablet, Take 1 tablet (4 mg total) by mouth every 6 (six) hours as needed for nausea or vomiting., Disp: 30 tablet, Rfl: 1  Observations/Objective: Patient is well-developed, well-nourished in no acute distress.  Resting comfortably at home.  Head is normocephalic, atraumatic.  No labored breathing.  Speech is clear and coherent with logical content.  Patient is alert and oriented at baseline.    Assessment and Plan: 1. Neck pain - cyclobenzaprine (FLEXERIL) 10 MG tablet; Take 0.5-1 tablets (5-10 mg total) by mouth 3 (three) times daily as needed for muscle spasms.  Dispense: 30 tablet; Refill: 0 - methylPREDNISolone (MEDROL DOSEPAK) 4 MG TBPK tablet; 6 day taper; take as directed on package instructions  Dispense: 21 tablet; Refill: 0  2. Tension headache - cyclobenzaprine (FLEXERIL) 10 MG tablet; Take 0.5-1 tablets (5-10 mg total) by mouth 3 (three) times daily as needed for muscle spasms.  Dispense: 30 tablet; Refill: 0 - methylPREDNISolone (MEDROL DOSEPAK) 4 MG TBPK tablet; 6 day taper; take as directed on package instructions  Dispense: 21 tablet; Refill: 0  - Suspect tension  headaches from muscle spasm/tension - Will treat with medrol and flexeril - Warm compresses - Light stretches and exercises provided via AVS - Drowsiness precautions with muscle relaxers discussed - Seek in person evaluation if worsening or fails to improve  Follow Up Instructions: I discussed the assessment and treatment plan with the patient. The patient was provided an opportunity to ask questions and all were answered. The patient agreed with the plan and demonstrated an understanding of the instructions.  A copy of instructions were sent to the patient via MyChart unless otherwise noted below.    The patient was advised to call back or seek an in-person evaluation if the symptoms worsen or if the condition fails to improve as anticipated.  Time:  I spent 12 minutes with the patient via telehealth technology discussing the above problems/concerns.    Mar Daring, PA-C

## 2021-03-13 NOTE — Patient Instructions (Signed)
Lydia [name], thank you for joining Lydia Daring, Lydia [name] for today's virtual visit.  While this provider is not your primary care provider (PCP), if your PCP is located in our provider database this encounter information will be shared with them immediately following your visit.  Consent: (Patient) Lydia [name] provided verbal consent for this virtual visit at the beginning of the encounter.  Current Medications:  Current Outpatient Medications:    cyclobenzaprine (FLEXERIL) 10 MG tablet, Take 0.5-1 tablets (5-10 mg total) by mouth 3 (three) times daily as needed for muscle spasms., Disp: 30 tablet, Rfl: 0   methylPREDNISolone (MEDROL DOSEPAK) 4 MG TBPK tablet, 6 day taper; take as directed on package instructions, Disp: 21 tablet, Rfl: 0   acetaminophen (TYLENOL) 500 MG tablet, Take 1,000 mg by mouth every 8 (eight) hours as needed., Disp: , Rfl:    cetirizine (ZYRTEC ALLERGY) 10 MG tablet, Take 1 tablet (10 mg total) by mouth at bedtime., Disp: 14 tablet, Rfl: 0   famotidine (PEPCID) 20 MG tablet, Take 1 tablet (20 mg total) by mouth 2 (two) times daily., Disp: 30 tablet, Rfl: 0   hydrOXYzine (VISTARIL) 25 MG capsule, Take 25 mg by mouth 2 (two) times daily as needed., Disp: , Rfl:    ibuprofen (ADVIL) 800 MG tablet, Take 1 tablet (800 mg total) by mouth every 8 (eight) hours as needed., Disp: 30 tablet, Rfl: 0   JUNEL 1.5/30 1.5-30 MG-MCG tablet, Take one tablet po daily., Disp: 28 tablet, Rfl: 11   JUNEL FE 1.5/30 1.5-30 MG-MCG tablet, Take 1 tablet by mouth daily., Disp: , Rfl:    MOTEGRITY 2 MG TABS, TAKE 1 TABLET(2 MG) BY MOUTH DAILY, Disp: 30 tablet, Rfl: 3   ondansetron (ZOFRAN) 4 MG tablet, Take 1 tablet (4 mg total) by mouth every 6 (six) hours as needed for nausea or vomiting., Disp: 30 tablet, Rfl: 1   Medications ordered in this encounter:  Meds ordered this encounter  Medications   cyclobenzaprine (FLEXERIL) 10 MG tablet    Sig: Take 0.5-1 tablets (5-10 mg total) by  mouth 3 (three) times daily as needed for muscle spasms.    Dispense:  30 tablet    Refill:  0    Order Specific Question:   Supervising Provider    Answer:   MILLER, BRIAN [[id number]]   methylPREDNISolone (MEDROL DOSEPAK) 4 MG TBPK tablet    Sig: 6 day taper; take as directed on package instructions    Dispense:  21 tablet    Refill:  0    Order Specific Question:   Supervising Provider    Answer:   Sabra Heck, BRIAN [[id number]]     *If you need refills on other medications prior to your next appointment, please contact your pharmacy*  Follow-Up: Call back or seek an in-person evaluation if the symptoms worsen or if the condition fails to improve as anticipated.  Other Instructions Tension Headache, Adult A tension headache is a feeling of pain, pressure, or aching over the front and sides of the head. The pain can be dull, or it can feel tight. There are two types of tension headache: Episodic tension headache. This is when the headaches happen fewer than 15 days a month. Chronic tension headache. This is when the headaches happen more than 15 days a month during a 76-month period. A tension headache can last from 30 minutes to several days. It is the most common kind of headache. Tension headaches are not normally associated with nausea  or vomiting, and they do not get worse with physical activity. What are the causes? The exact cause of this condition is not known. Tension headaches are often triggered by stress, anxiety, or depression. Other triggers may include: Alcohol. Too much caffeine or caffeine withdrawal. Respiratory infections, such as colds, flu, or sinus infections. Dental problems or teeth clenching. Fatigue. Holding your head and neck in the same position for a long period of time, such as while using a computer. Smoking. Arthritis of the neck. What are the signs or symptoms? Symptoms of this condition include: A feeling of pressure or tightness around the head. Dull, aching  head pain. Pain over the front and sides of the head. Tenderness in the muscles of the head, neck, and shoulders. How is this diagnosed? This condition may be diagnosed based on your symptoms, your medical history, and a physical exam. If your symptoms are severe or unusual, you may have imaging tests, such as a CT scan or an MRI of your head. Your vision may also be checked. How is this treated? This condition may be treated with lifestyle changes and with medicines that help relieve symptoms. Follow these instructions at home: Managing pain Take over-the-counter and prescription medicines only as told by your health care provider. When you have a headache, lie down in a dark, quiet room. If directed, put ice on your head and neck. To do this: Put ice in a plastic bag. Place a towel between your skin and the bag. Leave the ice on for 20 minutes, 2-3 times a day. Remove the ice if your skin turns bright red. This is very important. If you cannot feel pain, heat, or cold, you have a greater risk of damage to the area. If directed, apply heat to the back of your neck as often as told by your health care provider. Use the heat source that your health care provider recommends, such as a moist heat pack or a heating pad. Place a towel between your skin and the heat source. Leave the heat on for 20-30 minutes. Remove the heat if your skin turns bright red. This is especially important if you are unable to feel pain, heat, or cold. You have a greater risk of getting burned. Eating and drinking Eat meals on a regular schedule. If you drink alcohol: Limit how much you have to: 0-1 drink a day for women who are not pregnant. 0-2 drinks a day for men. Know how much alcohol is in your drink. In the [place] one drink equals one 12 oz bottle of beer (355 mL), one 5 oz glass of wine (148 mL), or one 1 oz glass of hard liquor (44 mL). Drink enough fluid to keep your urine pale yellow. Decrease your  caffeine intake, or stop using caffeine. Lifestyle Get 7-9 hours of sleep each night, or get the amount of sleep recommended by your health care provider. At bedtime, remove computers, phones, and tablets from your room. Find ways to manage your stress. This may include: Exercise. Deep breathing exercises. Yoga. Listening to music. Positive mental imagery. Try to sit up straight and avoid tensing your muscles. Do not use any products that contain nicotine or tobacco. These include cigarettes, chewing tobacco, and vaping devices, such as e-cigarettes. If you need help quitting, ask your health care provider. General instructions  Avoid any headache triggers. Keep a journal to help find out what may trigger your headaches. For example, write down: What you eat and drink. How much  sleep you get. Any change to your diet or medicines. Keep all follow-up visits. This is important. Contact a health care provider if: Your headache does not get better. Your headache comes back. You are sensitive to sounds, light, or smells because of a headache. You have nausea or you vomit. Your stomach hurts. Get help right away if: You suddenly develop a severe headache, along with any of the following: A stiff neck. Nausea and vomiting. Confusion. Weakness in one part or one side of your body. Double vision or loss of vision. Shortness of breath. Rash. Unusual sleepiness. Fever or chills. Trouble speaking. Pain in your eye or ear. Trouble walking or balancing. Feeling faint or passing out. Summary A tension headache is a feeling of pain, pressure, or aching over the front and sides of the head. A tension headache can last from 30 minutes to several days. It is the most common kind of headache. This condition may be diagnosed based on your symptoms, your medical history, and a physical exam. This condition may be treated with lifestyle changes and with medicines that help relieve symptoms. This  information is not intended to replace advice given to you by your health care provider. Make sure you discuss any questions you have with your health care provider. Document Revised: 01/27/2020 Document Reviewed: 01/27/2020 Elsevier Patient Education  2022 [employer].   Neck Exercises Ask your health care provider which exercises are safe for you. Do exercises exactly as told by your health care provider and adjust them as directed. It is normal to feel mild stretching, pulling, tightness, or discomfort as you do these exercises. Stop right away if you feel sudden pain or your pain gets worse. Do not begin these exercises until told by your health care provider. Neck exercises can be important for many reasons. They can improve strength and maintain flexibility in your neck, which will help your upper back and prevent neck pain. Stretching exercises Rotation neck stretching  Sit in a chair or stand up. Place your feet flat on the floor, shoulder width apart. Slowly turn your head (rotate) to the right until a slight stretch is felt. Turn it all the way to the right so you can look over your right shoulder. Do not tilt or tip your head. Hold this position for 10-30 seconds. Slowly turn your head (rotate) to the left until a slight stretch is felt. Turn it all the way to the left so you can look over your left shoulder. Do not tilt or tip your head. Hold this position for 10-30 seconds. Repeat __________ times. Complete this exercise __________ times a day. Neck retraction Sit in a sturdy chair or stand up. Look straight ahead. Do not bend your neck. Use your fingers to push your chin backward (retraction). Do not bend your neck for this movement. Continue to face straight ahead. If you are doing the exercise properly, you will feel a slight sensation in your throat and a stretch at the back of your neck. Hold the stretch for 1-2 seconds. Repeat __________ times. Complete this exercise  __________ times a day. Strengthening exercises Neck press Lie on your back on a firm bed or on the floor with a pillow under your head. Use your neck muscles to push your head down on the pillow and straighten your spine. Hold the position as well as you can. Keep your head facing up (in a neutral position) and your chin tucked. Slowly count to 5 while holding this position.  Repeat __________ times. Complete this exercise __________ times a day. Isometrics These are exercises in which you strengthen the muscles in your neck while keeping your neck still (isometrics). Sit in a supportive chair and place your hand on your forehead. Keep your head and face facing straight ahead. Do not flex or extend your neck while doing isometrics. Push forward with your head and neck while pushing back with your hand. Hold for 10 seconds. Do the sequence again, this time putting your hand against the back of your head. Use your head and neck to push backward against the hand pressure. Finally, do the same exercise on either side of your head, pushing sideways against the pressure of your hand. Repeat __________ times. Complete this exercise __________ times a day. Prone head lifts Lie face-down (prone position), resting on your elbows so that your chest and upper back are raised. Start with your head facing downward, near your chest. Position your chin either on or near your chest. Slowly lift your head upward. Lift until you are looking straight ahead. Then continue lifting your head as far back as you can comfortably stretch. Hold your head up for 5 seconds. Then slowly lower it to your starting position. Repeat __________ times. Complete this exercise __________ times a day. Supine head lifts Lie on your back (supine position), bending your knees to point to the ceiling and keeping your feet flat on the floor. Lift your head slowly off the floor, raising your chin toward your chest. Hold for 5  seconds. Repeat __________ times. Complete this exercise __________ times a day. Scapular retraction Stand with your arms at your sides. Look straight ahead. Slowly pull both shoulders (scapulae) backward and downward (retraction) until you feel a stretch between your shoulder blades in your upper back. Hold for 10-30 seconds. Relax and repeat. Repeat __________ times. Complete this exercise __________ times a day. Contact a health care provider if: Your neck pain or discomfort gets much worse when you do an exercise. Your neck pain or discomfort does not improve within 2 hours after you exercise. If you have any of these problems, stop exercising right away. Do not do the exercises again unless your health care provider says that you can. Get help right away if: You develop sudden, severe neck pain. If this happens, stop exercising right away. Do not do the exercises again unless your health care provider says that you can. This information is not intended to replace advice given to you by your health care provider. Make sure you discuss any questions you have with your health care provider. Document Revised: 02/25/2018 Document Reviewed: 02/25/2018 Elsevier Patient Education  2022 [employer].    If you have been instructed to have an in-person evaluation today at a local Urgent Care facility, please use the link below. It will take you to a list of all of our available [facility], including address, phone number and hours of operation. Please do not delay care.  [facility]  If you or a family member do not have a primary care provider, use the link below to schedule a visit and establish care. When you choose a [facility] primary care physician or advanced practice provider, you gain a long-term partner in health. Find a Primary Care Provider  Learn more about [facility]'s in-office and virtual care options: [facility]

## 2021-03-15 ENCOUNTER — Ambulatory Visit
Admission: RE | Admit: 2021-03-15 | Discharge: 2021-03-15 | Disposition: A | Discharge status: Home / Self Care | Payer: Medicaid Other | Source: Ambulatory Visit | Attending: Orthopedic Surgery | Admitting: Orthopedic Surgery

## 2021-03-15 DIAGNOSIS — M25532 Pain in left wrist: Secondary | ICD-10-CM

## 2021-03-19 ENCOUNTER — Ambulatory Visit (INDEPENDENT_AMBULATORY_CARE_PROVIDER_SITE_OTHER): Payer: Medicaid Other | Admitting: Surgical

## 2021-03-19 ENCOUNTER — Other Ambulatory Visit: Payer: Self-pay

## 2021-03-19 ENCOUNTER — Encounter: Payer: Self-pay | Admitting: Orthopedic Surgery

## 2021-03-19 ENCOUNTER — Ambulatory Visit: Payer: Self-pay

## 2021-03-19 DIAGNOSIS — M542 Cervicalgia: Secondary | ICD-10-CM | POA: Diagnosis not present

## 2021-03-19 NOTE — Progress Notes (Addendum)
Office Visit Note   Patient: Lydia [name]           Date of Birth: [birth year]           MRN: [id number] Visit Date: 03/19/2021 Requested by: No referring provider defined for this encounter. PCP: Pcp, No  Subjective: Chief Complaint  Patient presents with   Other    MRI review     HPI: Lydia [name] is a 35 y.o. female who presents to the office for MRI review. Patient denies any changes in symptoms.  Continues to complain mainly of bilateral hand pain that she states gives her sensation of "feeling tired" and numbness and tingling through all 5 fingers on the palmar and dorsal aspect of both hands.  Left hand is more symptomatic than the right.  She has no history of surgery to the hands.  She also reports new complaint of severe pain in the neck over the last month with no injury.  No radicular pain, shoulder blade pain, numbness/tingling outside of her hands.  Pain is not worse with rotation of the cervical spine.  Neck pain is worse in the morning.  MRI results revealed: MR Wrist Left w/o contrast  Result Date: 03/17/2021 CLINICAL DATA:  Left wrist pain along the dorsal aspect. EXAM: MR OF THE LEFT WRIST WITHOUT CONTRAST TECHNIQUE: Multiplanar, multisequence MR imaging of the left wrist was performed. No intravenous contrast was administered. COMPARISON:  None. FINDINGS: Ligaments: Extrinsic ligaments are intact. Scapholunate and lunotriquetral ligaments are intact. Triangular fibrocartilage: Intact Tendons: Mild tendinosis of the extensor carpi ulnaris tendon. Remainder of the extensor compartment tendons are intact. Flexor compartment tendons are intact. Carpal tunnel/median nerve: Flexor retinaculum is intact. Normal carpal tunnel without a mass. Median nerve demonstrates normal signal and caliber. Guyon's canal: Normal Guyon's canal. Normal ulnar nerve. Joint/cartilage: No joint effusion. Mild partial-thickness cartilage loss of the first CMC joint. Bones/carpal alignment: No  fracture, avascular necrosis, or osseous lesion. Normal alignment. Other: Muscles are normal. No fluid collection, hematoma, or soft tissue mass. IMPRESSION: 1. Mild tendinosis of the extensor carpi ulnaris tendon. Electronically Signed   By: Kathreen Devoid M.D.   On: 03/17/2021 08:32                 ROS: All systems reviewed are negative as they relate to the chief complaint within the history of present illness.  Patient denies fevers or chills.  Assessment & Plan: Visit Diagnoses:  1. Neck pain     Plan: Lydia [name] is a 35 y.o. female who presents to the office complaining of bilateral hand pain.  She is here to review MRI of the wrist.  She has had negative nerve conduction studies with symptoms that are suggestive of carpal tunnel syndrome.  Physical exam is suggestive of carpal tunnel syndrome but she also has new complaint of neck pain.  Plan to get patient set up for ultrasound-guided carpal tunnel injection into the left wrist for diagnostic/therapeutic purposes.  Also obtained cervical spine radiographs today and will reevaluate patient's neck pain at next appointment following the ultrasound injection.  She will follow-up on Wednesday for ultrasound-guided carpal tunnel injection of the left wrist.  Follow-Up Instructions: No follow-ups on file.   Orders:  Orders Placed This Encounter  Procedures   XR Cervical Spine 2 or 3 views   No orders of the defined types were placed in this encounter.     Procedures: No procedures performed   Clinical Data: No additional  findings.  Objective: Vital Signs: There were no vitals taken for this visit.  Physical Exam:  Constitutional: Patient appears well-developed HEENT:  Head: Normocephalic Eyes:EOM are normal Neck: Normal range of motion Cardiovascular: Normal rate Pulmonary/chest: Effort normal Neurologic: Patient is alert Skin: Skin is warm Psychiatric: Patient has normal mood and affect  Ortho Exam: Ortho exam  demonstrates cervical spine with intact active and passive range of motion is well-preserved.  She has increased pain with extension and flexion of the cervical spine.  Tenderness over the inferior cervical spine around the level of C6.  Negative Lhermitte sign.  Negative Spurling sign.  5/5 motor strength of bilateral grip strength, finger abduction, pronation/supination, bicep, tricep, deltoid.  Intact EPL, FPL, wrist extension bilaterally.  Positive Phalen and Tinel sign of the bilateral carpal tunnel that reproduce patient's symptoms of "feeling heavy" and numbness/tingling.  Specialty Comments:  No specialty comments available.  Imaging: No results found.   PMFS History: Patient Active Problem List   Diagnosis Date Noted   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/22/2020   Family history of congenital anomalies    Dyspareunia, female 03/11/2014   Acute recurrent maxillary sinusitis 03/11/2014   Abdominal pain, epigastric 01/26/2014   Tinea versicolor 12/08/2013   Pain 12/08/2013   Pelvic pain in female 07/06/2013   Dysmenorrhea 10/21/2012   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   HEADACHE 11/30/2007   Past Medical History:  Diagnosis Date   Allergy    Anxiety    Chronic constipation    Motegrity helps    Chronic headaches    none x 2-3 yrs   GERD (gastroesophageal reflux disease)    Ileitis    Infection    UTI   Meningitis    Ovarian cyst     Family History  Problem Relation Age of Onset   Hypertension Mother    Asthma Father    Breast cancer Maternal Aunt 42   Birth defects Daughter        Limb reduction defect   Colon cancer Neg Hx    Esophageal cancer Neg Hx    Rectal cancer Neg Hx    Stomach cancer Neg Hx    Colon polyps Neg Hx     Past Surgical History:  Procedure Laterality Date   CHOLECYSTECTOMY  2016   COLONOSCOPY     LAPAROSCOPIC APPENDECTOMY N/A 11/10/2020   Procedure: LAPAROSCOPIC APPENDECTOMY;  Surgeon: Mickeal Skinner, MD;  Location: [facility];  Service: General;  Laterality:  N/A;   UPPER GASTROINTESTINAL ENDOSCOPY  2015   WISDOM TOOTH EXTRACTION     Social History   Occupational History   Not on file  Tobacco Use   Smoking status: Never   Smokeless tobacco: Never  Vaping Use   Vaping Use: Never used  Substance and Sexual Activity   Alcohol use: No    Alcohol/week: 0.0 standard drinks   Drug use: No   Sexual activity: Yes    Partners: Male    Birth control/protection: None, Pill

## 2021-03-29 ENCOUNTER — Encounter: Payer: Self-pay | Admitting: *Deleted

## 2021-03-29 ENCOUNTER — Telehealth: Payer: Self-pay | Admitting: *Deleted

## 2021-03-29 DIAGNOSIS — K5904 Chronic idiopathic constipation: Secondary | ICD-10-CM | POA: Insufficient documentation

## 2021-03-29 NOTE — Telephone Encounter (Signed)
Prior authorization for Motegrity done today through Cover My Meds. Waiting on response

## 2021-04-02 NOTE — Telephone Encounter (Signed)
03/29/2021 RE: Medication Service Request Approval Member ID: [id number] DOB: 1985-12-20 Case ID: [id number] Zenovia Jarred [address] [place], [place] [address]  Re: Initial Service Request Determination-Approval Dear Ulice Dash Pyrtle: [place] [facility] has reviewed the request for Motegrity Tab 2mg  submitted by Zenovia Jarred on behalf of Jadasia Haws on 03/29/2021. After review, the request for service is: Approved through 03/29/2022. Sincerely, Pharmacy Department

## 2021-04-20 ENCOUNTER — Encounter (HOSPITAL_BASED_OUTPATIENT_CLINIC_OR_DEPARTMENT_OTHER): Payer: Self-pay | Admitting: Nurse Practitioner

## 2021-04-20 ENCOUNTER — Ambulatory Visit (INDEPENDENT_AMBULATORY_CARE_PROVIDER_SITE_OTHER): Payer: Medicaid Other | Admitting: Nurse Practitioner

## 2021-04-20 ENCOUNTER — Other Ambulatory Visit: Payer: Self-pay

## 2021-04-20 VITALS — BP 117/72 | HR 80 | Ht <= 58 in | Wt 156.0 lb

## 2021-04-20 DIAGNOSIS — R110 Nausea: Secondary | ICD-10-CM | POA: Diagnosis not present

## 2021-04-20 DIAGNOSIS — Z23 Encounter for immunization: Secondary | ICD-10-CM

## 2021-04-20 DIAGNOSIS — R519 Headache, unspecified: Secondary | ICD-10-CM | POA: Diagnosis not present

## 2021-04-20 DIAGNOSIS — F411 Generalized anxiety disorder: Secondary | ICD-10-CM

## 2021-04-20 MED ORDER — ONDANSETRON 8 MG PO TBDP: 8.0000 mg | ORAL_TABLET | Freq: Three times a day (TID) | ORAL | 3 refills | Status: DC | PRN

## 2021-04-20 MED ORDER — TOPIRAMATE 50 MG PO TABS: ORAL_TABLET | ORAL | 3 refills | Status: DC

## 2021-04-20 MED ORDER — BUSPIRONE HCL 5 MG PO TABS: 5.0000 mg | ORAL_TABLET | Freq: Two times a day (BID) | ORAL | 3 refills | Status: DC | PRN

## 2021-04-20 MED ORDER — SERTRALINE HCL 50 MG PO TABS: ORAL_TABLET | ORAL | 3 refills | Status: DC

## 2021-04-20 NOTE — Assessment & Plan Note (Signed)
Positive PHQ-9 and GAD-7 present today. Symptoms are consistent with generalized anxiety disorder. Condition is likely exacerbated by chronic daily headaches which can be debilitating. She has failed hydroxyzine only for symptom management. We will start treatment today with sertraline and as needed BuSpar for panic or worsening anxiety symptoms. No alarm symptoms present today. We will plan to follow-up in 4 weeks to monitor effectiveness of medication and make dosing changes or changes to medication and hold at that time as needed.

## 2021-04-20 NOTE — Patient Instructions (Signed)
Thank you for choosing [place] [facility] for your Primary Care needs. I am excited for the opportunity to partner with you to meet your health care goals. It was a pleasure meeting you today!  Recommendations from today's visit: We will try the medication topiramate to see if this is helpful to prevent the headaches.  We will try sertraline daily and buspar as needed for anxiety We will plan for follow-up in 4 weeks to see if the medications are helping your symptoms.  I sent the referral to neurology so we can see if we can get you back in to see them.   Information on diet, exercise, and health maintenance recommendations are listed below. This is information to help you be sure you are on track for optimal health and monitoring.   Please look over this and let us know if you have any questions or if you have completed any of the health maintenance outside of [place] so that we can be sure your records are up to date.  ___________________________________________________________ About Me: I am an Adult-Geriatric Nurse Practitioner with a background in caring for patients for more than 20 years with a strong intensive care background. I provide primary care and sports medicine services to patients age 18 and older within this office. My education had a strong focus on caring for the older adult population, which I am passionate about. I am also the director of the APP Fellowship with [facility]   My desire is to provide you with the best service through preventive medicine and supportive care. I consider you a part of the medical team and value your input. I work diligently to ensure that you are heard and your needs are met in a safe and effective manner. I want you to feel comfortable with me as your provider and want you to know that your health concerns are important to me.  For your information, our office hours are: Monday, Tuesday, and Thursday 8:00 AM -  5:00 PM Wednesday and Friday 8:00 AM - 12:00 PM.   In my time away from the office I am teaching new APP's within the system and am unavailable, but my partner, Dr. Burnard Bunting is in the office for emergent needs.   If you have questions or concerns, please call our office at [phone] or send [place] a MyChart message and we will respond as quickly as possible.  ____________________________________________________________ MyChart:  For all urgent or time sensitive needs we ask that you please call the office to avoid delays. Our number is [phone]. MyChart is not constantly monitored and due to the large volume of messages a day, replies may take up to 72 business hours.  MyChart Policy: MyChart allows for you to see your visit notes, after visit summary, provider recommendations, lab and tests results, make an appointment, request refills, and contact your provider or the office for non-urgent questions or concerns. Providers are seeing patients during normal business hours and do not have built in time to review MyChart messages.  We ask that you allow a minimum of 3 business days for responses to [employer]. For this reason, please do not send urgent requests through [place]. Please call the office at [phone]. New and ongoing conditions may require a visit. We have virtual and in person visit available for your convenience.  Complex MyChart concerns may require a visit. Your provider may request you schedule a virtual or in person visit to ensure we are  providing the best care possible. MyChart messages sent after 11:00 AM on Friday will not be received by the provider until Monday morning.    Lab and Test Results: You will receive your lab and test results on MyChart as soon as they are completed and results have been sent by the lab or testing facility. Due to this service, you will receive your results BEFORE your provider.  I review lab and tests results each morning prior to  seeing patients. Some results require collaboration with other providers to ensure you are receiving the most appropriate care. For this reason, we ask that you please allow a minimum of 3-5 business days from the time the ALL results have been received for your provider to receive and review lab and test results and contact you about these.  Most lab and test result comments from the provider will be sent through [place]. Your provider may recommend changes to the plan of care, follow-up visits, repeat testing, ask questions, or request an office visit to discuss these results. You may reply directly to this message or call the office at [phone] to provide information for the provider or set up an appointment. In some instances, you will be called with test results and recommendations. Please let us know if this is preferred and we will make note of this in your chart to provide this for you.    If you have not heard a response to your lab or test results in 5 business days from all results returning to [place], please call the office to let us know. We ask that you please avoid calling prior to this time unless there is an emergent concern. Due to high call volumes, this can delay the resulting process.  After Hours: For all non-emergency after hours needs, please call the office at [phone] and select the option to reach the on-call provider service. On-call services are shared between multiple [place] offices and therefore it will not be possible to speak directly with your provider. On-call providers may provide medical advice and recommendations, but are unable to provide refills for maintenance medications.  For all emergency or urgent medical needs after normal business hours, we recommend that you seek care at the closest Urgent Care or Emergency Department to ensure appropriate treatment in a timely manner.  [facility] has a 24 hour emergency room located on the  ground floor for your convenience.   Urgent Concerns During the Business Day Providers are seeing patients from 8AM to [place] with a busy schedule and are most often not able to respond to non-urgent calls until the end of the day or the next business day. If you should have URGENT concerns during the day, please call and speak to the nurse or schedule a same day appointment so that we can address your concern without delay.   Thank you, again, for choosing me as your health care partner. I appreciate your trust and look forward to learning more about you.   Worthy Keeler, DNP, AGNP-c ___________________________________________________________  Health Maintenance Recommendations Screening Testing Mammogram Every 1 -2 years based on history and risk factors Starting at age 33 Pap Smear Ages 21-39 every 3 years Ages 55-65 every 5 years with HPV testing More frequent testing may be required based on results and history Colon Cancer Screening Every 1-10 years based on test performed, risk factors, and history Starting at age 46 Bone Density Screening Every 2-10 years based on history Starting at age  17 for women Recommendations for men differ based on medication usage, history, and risk factors AAA Screening One time ultrasound Men 41-28 years old who have every smoked Lung Cancer Screening Low Dose Lung CT every 12 months Age 7-80 years with a 30 pack-year smoking history who still smoke or who have quit within the last 15 years  Screening Labs Routine  Labs: Complete Blood Count (CBC), Complete Metabolic Panel (CMP), Cholesterol (Lipid Panel) Every 6-12 months based on history and medications May be recommended more frequently based on current conditions or previous results Hemoglobin A1c Lab Every 3-12 months based on history and previous results Starting at age 30 or earlier with diagnosis of diabetes, high cholesterol, BMI >26, and/or risk factors Frequent monitoring for  patients with diabetes to ensure blood sugar control Thyroid Panel (TSH w/ T3 & T4) Every 6 months based on history, symptoms, and risk factors May be repeated more often if on medication HIV One time testing for all patients 71 and older May be repeated more frequently for patients with increased risk factors or exposure Hepatitis C One time testing for all patients 51 and older May be repeated more frequently for patients with increased risk factors or exposure Gonorrhea, Chlamydia Every 12 months for all sexually active persons 13-24 years Additional monitoring may be recommended for those who are considered high risk or who have symptoms PSA Men 91-34 years old with risk factors Additional screening may be recommended from age 25-69 based on risk factors, symptoms, and history  Vaccine Recommendations Tetanus Booster All adults every 10 years Flu Vaccine All patients 6 months and older every year COVID Vaccine All patients 12 years and older Initial dosing with booster May recommend additional booster based on age and health history HPV Vaccine 2 doses all patients age 55-26 Dosing may be considered for patients over 26 Shingles Vaccine (Shingrix) 2 doses all adults 19 years and older Pneumonia (Pneumovax 23) All adults 95 years and older May recommend earlier dosing based on health history Pneumonia (Prevnar 55) All adults 62 years and older Dosed 1 year after Pneumovax 23  Additional Screening, Testing, and Vaccinations may be recommended on an individualized basis based on family history, health history, risk factors, and/or exposure.  __________________________________________________________  Diet Recommendations for All Patients  I recommend that all patients maintain a diet low in saturated fats, carbohydrates, and cholesterol. While this can be challenging at first, it is not impossible and small changes can make big differences.  Things to try: Decreasing the  amount of soda, sweet tea, and/or juice to one or less per day and replace with water While water is always the first choice, if you do not like water you may consider adding a water additive without sugar to improve the taste other sugar free drinks Replace potatoes with a brightly colored vegetable at dinner Use healthy oils, such as canola oil or olive oil, instead of butter or hard margarine Limit your bread intake to two pieces or less a day Replace regular pasta with low carb pasta options Bake, broil, or grill foods instead of frying Monitor portion sizes  Eat smaller, more frequent meals throughout the day instead of large meals  An important thing to remember is, if you love foods that are not great for your health, you don't have to give them up completely. Instead, allow these foods to be a reward when you have done well. Allowing yourself to still have special treats every once in a while is  a nice way to tell yourself thank you for working hard to keep yourself healthy.   Also remember that every day is a new day. If you have a bad day and "fall off the wagon", you can still climb right back up and keep moving along on your journey!  We have resources available to help you!  Some websites that may be helpful include: www.http://carter.biz/  Www.VeryWellFit.com _____________________________________________________________  Activity Recommendations for All Patients  I recommend that all adults get at least 20 minutes of moderate physical activity that elevates your heart rate at least 5 days out of the week.  Some examples include: Walking or jogging at a pace that allows you to carry on a conversation Cycling (stationary bike or outdoors) Water aerobics Yoga Weight lifting Dancing If physical limitations prevent you from putting stress on your joints, exercise in a pool or seated in a chair are excellent options.  Do determine your MAXIMUM heart rate for activity: YOUR AGE -  220 = MAX HeartRate   Remember! Do not push yourself too hard.  Start slowly and build up your pace, speed, weight, time in exercise, etc.  Allow your body to rest between exercise and get good sleep. You will need more water than normal when you are exerting yourself. Do not wait until you are thirsty to drink. Drink with a purpose of getting in at least 8, 8 ounce glasses of water a day plus more depending on how much you exercise and sweat.    If you begin to develop dizziness, chest pain, abdominal pain, jaw pain, shortness of breath, headache, vision changes, lightheadedness, or other concerning symptoms, stop the activity and allow your body to rest. If your symptoms are severe, seek emergency evaluation immediately. If your symptoms are concerning, but not severe, please let us know so that we can recommend further evaluation.

## 2021-04-20 NOTE — Assessment & Plan Note (Signed)
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 Patient reports previously well managed on Dilantin with neurology.  No records available to review today. No alarm symptoms present today-no weakness, dysphagia, dysarthria, or nystagmus. Symptoms have been fairly consistent with only new symptom of sensitivity to light within the past 6 months. Patient is unable to recall if she ever had imaging performed or if other medication was tried for this. Discussed with patient that we can certainly retry Dilantin or we could try an alternative medication that would not require such close monitoring.  She is interested in trying an alternative option. Joint decision to trial topiramate 50 mg/day with plan to titrate up to 100 mg for headache prevention. We will also send referral to neurology for further evaluation and recommendations. Recommend follow-up in 4 weeks.

## 2021-04-20 NOTE — Progress Notes (Signed)
Lydia Render, DNP, AGNP-c Primary Care & Sports Medicine [address]  [place], [place] [address] [phone]  New patient visit   Patient: Lydia [name]   DOB: 1985/10/23   35 y.o. Female  MRN: [id number] Visit Date: 04/20/2021  Patient Care Team: Lydia [name], Lydia Pesa, NP as PCP - General (Nurse Practitioner)  Today's healthcare provider: [name], NP   Chief Complaint  Patient presents with   New Patient (Initial Visit)    Patient is here to establish care. She is having daily headaches. These headaches started after having meningitis at the age of 29. Headaches she is nausea and light sensitive. She was seeing a neurologist and lost her insurance and wasn't able to go anymore.    Subjective    HPI  Lydia [name] is a 35 y.o. female who presents today as a new patient to establish care.    Lydia [name] endorses chronic daily headaches since age 4 with diagnosis of meningitis. She tells me that she has been seen by neurology [facility] neurology) in the past for management but was unable to continue when changes were made to her insurance and it was no longer accepted. She reports while seeing neurology she was managed with oral Dilantin which was significantly helpful and reduced her daily headaches. She tells me that she has been off of her medication for approximately the past 6 months and she has not had a headache free days since stopping. She endorses the pain begins as a squeezing sensation on the frontal portion of the skull and later transitions into a dull ache that is present all day long.  She has intermittent nausea associated with the headaches.  Recently she has also developed light sensitivity. She endorses headaches when waking up in the morning.  She states that the headache will ease around lunchtime but then gets worse again around 2-3 and is present until bedtime.  She has tried Tylenol however this was not helpful at all for any of the  symptoms. She reports the only thing that seems to make it better is to be in a quiet dark location. She endorses pain on average of a 5/10. She denies weakness, dizziness, mood changes, dysarthria, dysphagia. She does endorse some occasional lightheadedness but does not necessarily feel this is related to the headache.  She also feels like she has some memory changes.  Lydia [name] also endorses anxiety that has been present for some some time. In the past she has been treated with hydroxyzine however she did not feel that this was particularly helpful for her. She endorses that the anxiety tends to come and go however can be quite severe at times. She does feel medication would be helpful for her.  Past Medical History:  Diagnosis Date   Abdominal pain, epigastric 01/26/2014   Acute recurrent maxillary sinusitis 03/11/2014   Allergy    Anxiety    Chronic constipation    Motegrity helps    Chronic headaches    none x 2-3 yrs   GERD (gastroesophageal reflux disease)    HEADACHE 11/30/2007   Qualifier: Diagnosis of  By: Grier Rocher MD, Madaline Guthrie    Infection    UTI   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/22/2020   Meningitis    Ovarian cyst    Pain 12/08/2013   Dyspareunia    Pelvic pain in female 07/06/2013   Past Surgical History:  Procedure Laterality Date   CHOLECYSTECTOMY  2016  COLONOSCOPY     LAPAROSCOPIC APPENDECTOMY N/A 11/10/2020   Procedure: LAPAROSCOPIC APPENDECTOMY;  Surgeon: Kinsinger, Arta Bruce, MD;  Location: [facility];  Service: General;  Laterality: N/A;   UPPER GASTROINTESTINAL ENDOSCOPY  2015   WISDOM TOOTH EXTRACTION     Family Status  Relation Name Status   Mother  Alive   Father  Alive   MGM  Deceased   MGF  Alive   PGM  Deceased   PGF  Deceased   Mat Aunt  (Not Specified)   Daughter  (Not Specified)   Neg Hx  (Not Specified)   Family History  Problem Relation Age of Onset   Hypertension Mother    Asthma Father    Breast cancer Maternal Aunt 39   Birth defects  Daughter        Limb reduction defect   Colon cancer Neg Hx    Esophageal cancer Neg Hx    Rectal cancer Neg Hx    Stomach cancer Neg Hx    Colon polyps Neg Hx    Social History   Socioeconomic History   Marital status: Married    Spouse name: Not on file   Number of children: 2   Years of education: Not on file   Highest education level: Not on file  Occupational History   Not on file  Tobacco Use   Smoking status: Never   Smokeless tobacco: Never  Vaping Use   Vaping Use: Never used  Substance and Sexual Activity   Alcohol use: No    Alcohol/week: 0.0 standard drinks   Drug use: No   Sexual activity: Yes    Partners: Male    Birth control/protection: None, Pill  Other Topics Concern   Not on file  Social History Narrative   Not on file   Social Determinants of Health   Financial Resource Strain: Not on file  Food Insecurity: Not on file  Transportation Needs: Not on file  Physical Activity: Not on file  Stress: Not on file  Social Connections: Not on file   Outpatient Medications Prior to Visit  Medication Sig   cromolyn (OPTICROM) 4 % ophthalmic solution 1 drop 4 (four) times daily.   JUNEL FE 1.5/30 1.5-30 MG-MCG tablet Take 1 tablet by mouth daily.   MOTEGRITY 2 MG TABS TAKE 1 TABLET(2 MG) BY MOUTH DAILY   [DISCONTINUED] acetaminophen (TYLENOL) 500 MG tablet Take 1,000 mg by mouth every 8 (eight) hours as needed.   [DISCONTINUED] cetirizine (ZYRTEC ALLERGY) 10 MG tablet Take 1 tablet (10 mg total) by mouth at bedtime.   [DISCONTINUED] cyclobenzaprine (FLEXERIL) 10 MG tablet Take 0.5-1 tablets (5-10 mg total) by mouth 3 (three) times daily as needed for muscle spasms.   [DISCONTINUED] famotidine (PEPCID) 20 MG tablet Take 1 tablet (20 mg total) by mouth 2 (two) times daily.   [DISCONTINUED] hydrOXYzine (VISTARIL) 25 MG capsule Take 25 mg by mouth 2 (two) times daily as needed.   [DISCONTINUED] ibuprofen (ADVIL) 800 MG tablet Take 1 tablet (800 mg total) by  mouth every 8 (eight) hours as needed.   [DISCONTINUED] JUNEL 1.5/30 1.5-30 MG-MCG tablet Take one tablet po daily.   [DISCONTINUED] methylPREDNISolone (MEDROL DOSEPAK) 4 MG TBPK tablet 6 day taper; take as directed on package instructions   [DISCONTINUED] ondansetron (ZOFRAN) 4 MG tablet Take 1 tablet (4 mg total) by mouth every 6 (six) hours as needed for nausea or vomiting.   No facility-administered medications prior to visit.   Allergies  Allergen  Reactions   Penicillins Hives, Itching and Rash    Has patient had a PCN reaction causing immediate rash, facial/tongue/throat swelling, SOB or lightheadedness with hypotension: Yes Has patient had a PCN reaction causing severe rash involving mucus membranes or skin necrosis: Yes Has patient had a PCN reaction that required hospitalization no Has patient had a PCN reaction occurring within the last 10 years: No If all of the above answers are "NO", then may proceed with Cephalosporin use. Has patient had a PCN reaction causing immediate rash, facial/tongue/throat swelling, SOB or lightheadedness with hypotension: Yes Has patient had a PCN reaction causing severe rash involving mucus membranes or skin necrosis: Yes Has patient had a PCN reaction that required hospitalization no Has patient had a PCN reaction occurring within the last 10 years: No If all of the above answers are "NO", then may proceed with Cephalosporin use.    Immunization History  Administered Date(s) Administered   Influenza Split 07/20/2018, 03/14/2019   Influenza,inj,Quad PF,6+ Mos 04/20/2021   Influenza-Unspecified 08/05/2018, 03/01/2019   PFIZER Comirnaty(Gray Top)Covid-19 Tri-Sucrose Vaccine 08/16/2019, 09/07/2019, 05/31/2020   Tdap 08/04/2015    Health Maintenance  Topic Date Due   Hepatitis C Screening  Never done   COVID-19 Vaccine (4 - Booster for Pfizer series) 07/26/2020   PAP SMEAR-Modifier  06/30/2023   TETANUS/TDAP  08/03/2025   INFLUENZA VACCINE   Completed   HIV Screening  Completed   Pneumococcal Vaccine 73-34 Years old  Aged Out   HPV VACCINES  Aged Out    Patient Care Team: Willowdean Luhmann, Lydia Pesa, NP as PCP - General (Nurse Practitioner)  Review of Systems All review of systems negative except what is listed in the HPI   Objective    BP 117/72   Pulse 80   Ht 4\' 8"  (1.422 m)   Wt 156 lb (70.8 kg)   SpO2 97%   BMI 34.97 kg/m  Physical Exam  Depression Screen PHQ 2/9 Scores 04/20/2021 08/11/2019 07/29/2019 03/07/2015  PHQ - 2 Score 0 0 0 0  PHQ- 9 Score 4 - - -   No results found for any visits on 04/20/21.  Assessment & Plan      Problem List Items Addressed This Visit     Generalized anxiety disorder    Positive PHQ-9 and GAD-7 present today. Symptoms are consistent with generalized anxiety disorder. Condition is likely exacerbated by chronic daily headaches which can be debilitating. She has failed hydroxyzine only for symptom management. We will start treatment today with sertraline and as needed BuSpar for panic or worsening anxiety symptoms. No alarm symptoms present today. We will plan to follow-up in 4 weeks to monitor effectiveness of medication and make dosing changes or changes to medication and hold at that time as needed.       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 Patient reports previously well managed on Dilantin with neurology.  No records available to review today. No alarm symptoms present today-no weakness, dysphagia, dysarthria, or nystagmus. Symptoms have been fairly consistent with only new symptom of sensitivity to light within the past 6 months. Patient is unable to recall if she ever had imaging performed or if other medication was tried for this. Discussed with patient that we can certainly retry Dilantin or we could try an  alternative medication that would not require  such close monitoring.  She is interested in trying an alternative option. Joint decision to trial topiramate 50 mg/day with plan to titrate up to 100 mg for headache prevention. We will also send referral to neurology for further evaluation and recommendations. Recommend follow-up in 4 weeks.      Relevant Medications   topiramate (TOPAMAX) 50 MG tablet   ondansetron (ZOFRAN-ODT) 8 MG disintegrating tablet   sertraline (ZOLOFT) 50 MG tablet   Other Relevant Orders   Ambulatory referral to Neurology   Other Visit Diagnoses     Encounter for immunization       Relevant Orders   Flu Vaccine QUAD 6+ mos PF IM (Fluarix Quad PF) (Completed)   Nausea       Relevant Medications   ondansetron (ZOFRAN-ODT) 8 MG disintegrating tablet        Return in about 4 weeks (around 05/18/2021) for Virtual- f/u HA and Anxiety- CPE in a few months.      Avamae Dehaan, Lydia Pesa, NP, DNP, AGNP-C Primary Care & Sports Medicine at [place]

## 2021-05-08 ENCOUNTER — Other Ambulatory Visit: Payer: Self-pay | Admitting: Obstetrics

## 2021-05-22 ENCOUNTER — Other Ambulatory Visit: Payer: Self-pay

## 2021-05-22 ENCOUNTER — Encounter (HOSPITAL_BASED_OUTPATIENT_CLINIC_OR_DEPARTMENT_OTHER): Payer: Self-pay | Admitting: Nurse Practitioner

## 2021-05-22 ENCOUNTER — Telehealth (INDEPENDENT_AMBULATORY_CARE_PROVIDER_SITE_OTHER): Payer: Medicaid Other | Admitting: Nurse Practitioner

## 2021-05-22 DIAGNOSIS — F411 Generalized anxiety disorder: Secondary | ICD-10-CM | POA: Diagnosis not present

## 2021-05-22 DIAGNOSIS — R519 Headache, unspecified: Secondary | ICD-10-CM

## 2021-05-22 MED ORDER — SERTRALINE HCL 50 MG PO TABS: 75.0000 mg | ORAL_TABLET | Freq: Every day | ORAL | 3 refills | Status: DC

## 2021-05-22 MED ORDER — TOPIRAMATE 100 MG PO TABS: 100.0000 mg | ORAL_TABLET | Freq: Every day | ORAL | 3 refills | Status: DC

## 2021-05-22 NOTE — Progress Notes (Signed)
Virtual Visit Encounter telephone visit.   I connected with  Lydia [name] on 05/23/21 at 10:10 AM EST by secure audio and/or video enabled telemedicine application. I verified that I am speaking with the correct person using two identifiers.   I introduced myself as a Designer, jewellery with the practice. The limitations of evaluation and management by telemedicine discussed with the patient and the availability of in person appointments. The patient expressed verbal understanding and consent to proceed.  Participating parties in this visit include: Myself and patient  The patient is: Patient Location: Home I am: Provider Location: Office/Clinic Subjective:    CC and HPI: Lydia [name] is a 36 y.o. year old female presenting for follow up of migraine headaches. Lydia [name] reports that her headaches are better, but still having 1-2 a day. They are lasting about 45 min-1 hour when they come. She does have sensitivity to light and nausea associated with the head ache. She has not had any vomiting, weakness, or new neurological symptoms.   She endorses her anxiety is improved, but she does not have the control that she would like at this time. She is still having some bothersome symptoms that are affecting her everyday life. She is taking the buspar as needed, but not on a daily basis.   All review of systems negative except what is listed above  Objective:    Alert and oriented x 4 Speaking in clear sentences with no shortness of breath. No distress.  Impression and Recommendations:    Problem List Items Addressed This Visit     Generalized anxiety disorder    Symptoms somewhat improved, but not complete resolution.  Will plan to increase sertraline to 75mg  at bedtime daily and see if we can get better control with this.  Continue to use buspar. May be taken as needed or on scheduled basis, if this is more effective.  F/U in 3 months      Relevant Medications   sertraline (ZOLOFT) 50 MG  tablet   Chronic daily headache    Daily headaches have improved, but not resolved. No alarm symptoms present.  Recommend increase topiramate to 100mg  per day to work to achieve control.  She does have an appt with neurology next week. Hopeful that they can recommend options that may be helpful in management.       Relevant Medications   topiramate (TOPAMAX) 100 MG tablet   sertraline (ZOLOFT) 50 MG tablet    I provided 20 minutes of non-face-to-face interaction with this non face-to-face encounter including intake, same-day documentation, and chart review.   [name], NP , DNP, AGNP-c [place] [facility] [phone] (fax)

## 2021-05-22 NOTE — Patient Instructions (Signed)
I have increased the sertraline to 75mg  taken once every evening. You can take 1 1/2 tabs of your current dose until you run out.   I have increased the Topamax to 100mg  taken once a day. You can take 2 tabs of your current dose until you run out.   I will watch for the notes from neurology to come through and we can determine if there are any changes needed based on that.   I would like to follow-up with you in 3 months for your mood to make sure you are feeling better - please do not hesitate to contact me sooner if you have any concerns.

## 2021-05-23 NOTE — Assessment & Plan Note (Addendum)
Daily headaches have improved, but not resolved. No alarm symptoms present.  Recommend increase topiramate to 100mg  per day to work to achieve control.  She does have an appt with neurology next week. Hopeful that they can recommend options that may be helpful in management.

## 2021-05-23 NOTE — Assessment & Plan Note (Signed)
Symptoms somewhat improved, but not complete resolution.  Will plan to increase sertraline to 75mg  at bedtime daily and see if we can get better control with this.  Continue to use buspar. May be taken as needed or on scheduled basis, if this is more effective.  F/U in 3 months

## 2021-05-27 ENCOUNTER — Encounter (HOSPITAL_BASED_OUTPATIENT_CLINIC_OR_DEPARTMENT_OTHER): Payer: Self-pay | Admitting: Nurse Practitioner

## 2021-05-29 ENCOUNTER — Ambulatory Visit: Payer: Medicaid Other | Admitting: Psychiatry

## 2021-05-29 ENCOUNTER — Encounter (HOSPITAL_BASED_OUTPATIENT_CLINIC_OR_DEPARTMENT_OTHER): Payer: Self-pay | Admitting: Nurse Practitioner

## 2021-05-29 ENCOUNTER — Other Ambulatory Visit: Payer: Self-pay

## 2021-05-29 ENCOUNTER — Other Ambulatory Visit (HOSPITAL_BASED_OUTPATIENT_CLINIC_OR_DEPARTMENT_OTHER): Payer: Self-pay | Admitting: Nurse Practitioner

## 2021-05-29 ENCOUNTER — Telehealth (INDEPENDENT_AMBULATORY_CARE_PROVIDER_SITE_OTHER): Payer: Medicaid Other | Admitting: Nurse Practitioner

## 2021-05-29 DIAGNOSIS — U071 COVID-19: Secondary | ICD-10-CM | POA: Insufficient documentation

## 2021-05-29 DIAGNOSIS — F411 Generalized anxiety disorder: Secondary | ICD-10-CM

## 2021-05-29 MED ORDER — BUSPIRONE HCL 5 MG PO TABS: 5.0000 mg | ORAL_TABLET | Freq: Two times a day (BID) | ORAL | 3 refills | Status: DC | PRN

## 2021-05-29 MED ORDER — NIRMATRELVIR/RITONAVIR (PAXLOVID)TABLET
3.0000 | ORAL_TABLET | Freq: Two times a day (BID) | ORAL | 0 refills | Status: AC
Start: 2021-05-29 — End: 2021-06-03

## 2021-05-29 NOTE — Assessment & Plan Note (Signed)
Paxlovid sent for COVID-19 treatment. GFR labs reviewed today.  Discussed the importance of completion of all of the medication to avoid rebound symptoms. Discussion of over-the-counter treatment regimens that may be helpful for symptom management.  Warnings discussed with patient that would warrant further evaluation or escalation of care. She will follow-up if needed.

## 2021-05-29 NOTE — Progress Notes (Signed)
Virtual Visit Encounter  telephone visit.   I connected with  Lydia [name] on 05/29/21 at  1:50 PM EST by secure audio and/or video enabled telemedicine application. I verified that I am speaking with the correct person using two identifiers.   I introduced myself as a Designer, jewellery with the practice. The limitations of evaluation and management by telemedicine discussed with the patient and the availability of in person appointments. The patient expressed verbal understanding and consent to proceed.  Participating parties in this visit include: Myself and patient  The patient is: Patient Location: Home I am: Provider Location: Office/Clinic Subjective:    CC and HPI: Lydia [name] is a 36 y.o. year old female presenting for new evaluation and treatment of positive COVID test. She noticed first symptoms over the weekend and tested. She endorses mild symptoms at this time, but feels that they are progressing. She is interested in medication to help with symptom management. She denies severe shortness of breath, fevers that do not respond to antibiotics, or inability to keep food or liquid down.   Past medical history, Surgical history, Family history not pertinant except as noted below, Social history, Allergies, and medications have been entered into the medical record, reviewed, and corrections made.   Review of Systems:  All review of systems negative except what is listed in the HPI  Objective:    Alert and oriented x 4 Speaking in clear sentences with no shortness of breath. No distress.  Impression and Recommendations:    Problem List Items Addressed This Visit   None   orders and follow up as documented in EMR I discussed the assessment and treatment plan with the patient. The patient was provided an opportunity to ask questions and all were answered. The patient agreed with the plan and demonstrated an understanding of the instructions.   The patient was advised to  call back or seek an in-person evaluation if the symptoms worsen or if the condition fails to improve as anticipated.  Follow-Up: prn  I provided 20 minutes of non-face-to-face interaction with this non face-to-face encounter including intake, same-day documentation, and chart review.   [name], NP , DNP, AGNP-c [place] at [facility] [phone] (fax)

## 2021-05-29 NOTE — Patient Instructions (Addendum)
Over the counter medications that may be helpful for symptoms: Guaifenesin 1200 mg extended release tabs twice daily, with plenty of water For cough and congestion Brand name: Mucinex   Pseudoephedrine 30 mg, one or two tabs every 4 to 6 hours For sinus congestion Brand name: Sudafed You must get this from the pharmacy counter.  Oxymetazoline nasal spray each morning, one spray in each nostril, for NO MORE THAN 3 days  For nasal and sinus congestion Brand name: Afrin Saline nasal spray or Saline Nasal Irrigation 3-5 times a day For nasal and sinus congestion Brand names: Ocean or AYR Fluticasone nasal spray, one spray in each nostril, each morning after oxymetazoline and saline, if used For nasal and sinus congestion Brand name: Flonase Warm salt water gargles  For sore throat Every few hours as needed Alternate ibuprofen 400-600 mg and acetaminophen 1000 mg every 4-6 hours For fever, body aches, headache Brand names: Motrin or Advil and Tylenol Dextromethorphan 12-hour cough version 30 mg every 12 hours  For cough Brand name: Delsym Stop all other cold medications for now (Nyquil, Dayquil, Tylenol Cold, Theraflu, etc) and other non-prescription cough/cold preparations. Many of these have the same ingredients listed above and could cause an overdose of medication.   General Instructions Allow your body to rest Drink PLENTY of fluids Isolate yourself from everyone, even family, until test results have returned  If your COVID-19 test is positive Then you ARE INFECTED and you can pass the virus to others You must quarantine from others for a minimum of  7 days since symptoms started AND You are fever free for 24 hours WITHOUT any medication to reduce fever AND Your symptoms are improving Do not go to the store or other public areas Do not go around household members who are not known to be infected with COVID-19 If you MUST leave you area of quarantine (example: go to a bathroom  you share with others in your home), you must Wear a mask Wash your hands thoroughly Wipe down any surfaces you touch Do not share food, drinks, towels, or other items with other persons Dispose of your own tissues, food containers, etc  Once you have recovered, please continue good preventive care measures, including:  wearing a mask when in public wash your hands frequently avoid touching your face/nose/eyes cover coughs/sneezes with the inside of your elbow stay out of crowds keep a 6 foot distance from others  Go to the nearest hospital emergency room if fever/cough/breathlessness are severe or illness seems like a threat to life.

## 2021-06-06 ENCOUNTER — Encounter (HOSPITAL_BASED_OUTPATIENT_CLINIC_OR_DEPARTMENT_OTHER): Payer: Self-pay | Admitting: Nurse Practitioner

## 2021-06-06 ENCOUNTER — Ambulatory Visit (INDEPENDENT_AMBULATORY_CARE_PROVIDER_SITE_OTHER): Payer: Medicaid Other | Admitting: Nurse Practitioner

## 2021-06-06 ENCOUNTER — Other Ambulatory Visit: Payer: Self-pay

## 2021-06-06 VITALS — BP 100/76 | HR 91 | Ht <= 58 in | Wt 158.0 lb

## 2021-06-06 DIAGNOSIS — R202 Paresthesia of skin: Secondary | ICD-10-CM

## 2021-06-06 DIAGNOSIS — F411 Generalized anxiety disorder: Secondary | ICD-10-CM | POA: Diagnosis not present

## 2021-06-06 DIAGNOSIS — F410 Panic disorder [episodic paroxysmal anxiety] without agoraphobia: Secondary | ICD-10-CM

## 2021-06-06 HISTORY — DX: Paresthesia of skin: R20.2

## 2021-06-06 MED ORDER — BUSPIRONE HCL 5 MG PO TABS: ORAL_TABLET | ORAL | 3 refills | Status: DC

## 2021-06-06 NOTE — Assessment & Plan Note (Signed)
Anxiety symptoms appear not well controlled at this time. We did increase her sertraline dose approximately 2 weeks ago.  Will obtain labs today and ensure that there are no additional concerns contributing to lack of control.  Symptoms consistent with panic. She has been missing doses of sertraline due to difficulty remembering to take this medication daily. Discussion on taking this medication routinely and recommendation on taking at same time as topamax to help with improved management. Will also add schedule dose of Buspar 5mg  daily with additional option to dose PRN 5mg  up to twice during the day if needed.  She expresses understanding of the changes.  Will plan to follow-up once we have lab results back or if symptoms worsen or fail to improve.

## 2021-06-06 NOTE — Progress Notes (Signed)
Acute Office Visit  Subjective:    Patient ID: Lydia [name], female    DOB: Jul 23, 1985, 36 y.o.   MRN: [id number]  Chief Complaint  Patient presents with   Numbness    Patient states that she is having a tingling sensation intermittently in her hands and feet. The feelings seem to be mor frequent in the left hand. The sensation doesn't have a specific moment or trigger of origin. Patient states the episodes usually last about 10 min. She admits to moving or shaking her hands and feel to try to alleviate the feeling    HPI Patient is in today for evaluation of paresthesias in her hands and feet.  She endorses prevalent paresthesia that started in her left hand approximately 2 weeks ago.  She reports that she was traveling to have breakfast with her family and suddenly began to feel "weird".  She endorses tightness in her chest at that time and a sensation of feeling hot despite it being cold outside.  She feels that her symptoms were related to panic although at that time she had no known trigger for panic symptoms.  She endorses having the same symptoms approximately 3 times since the initial symptoms.  She also endorses paresthesias bilaterally in the feet but not simultaneously in both feet.  She tells me that moving around helps to eliminate the symptoms as well as talking to calm herself down.  Past Medical History:  Diagnosis Date   Abdominal pain, epigastric 01/26/2014   Acute recurrent maxillary sinusitis 03/11/2014   Allergy    Anxiety    Chronic constipation    Motegrity helps    Chronic headaches    none x 2-3 yrs   GERD (gastroesophageal reflux disease)    HEADACHE 11/30/2007   Qualifier: Diagnosis of  By: Grier Rocher MD, Madaline Guthrie    Infection    UTI   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/22/2020   Meningitis    Ovarian cyst    Pain 12/08/2013   Dyspareunia    Pelvic pain in female 07/06/2013    Past Surgical History:  Procedure Laterality Date   CHOLECYSTECTOMY  2016    COLONOSCOPY     LAPAROSCOPIC APPENDECTOMY N/A 11/10/2020   Procedure: LAPAROSCOPIC APPENDECTOMY;  Surgeon: Mickeal Skinner, MD;  Location: [facility];  Service: General;  Laterality: N/A;   UPPER GASTROINTESTINAL ENDOSCOPY  2015   WISDOM TOOTH EXTRACTION      Family History  Problem Relation Age of Onset   Hypertension Mother    Asthma Father    Breast cancer Maternal Aunt 2   Birth defects Daughter        Limb reduction defect   Colon cancer Neg Hx    Esophageal cancer Neg Hx    Rectal cancer Neg Hx    Stomach cancer Neg Hx    Colon polyps Neg Hx     Social History   Socioeconomic History   Marital status: Married    Spouse name: Not on file   Number of children: 2   Years of education: Not on file   Highest education level: Not on file  Occupational History   Not on file  Tobacco Use   Smoking status: Never   Smokeless tobacco: Never  Vaping Use   Vaping Use: Never used  Substance and Sexual Activity   Alcohol use: No    Alcohol/week: 0.0 standard drinks   Drug use: No   Sexual activity: Yes  Partners: Male    Birth control/protection: None, Pill  Other Topics Concern   Not on file  Social History Narrative   Not on file   Social Determinants of Health   Financial Resource Strain: Not on file  Food Insecurity: Not on file  Transportation Needs: Not on file  Physical Activity: Not on file  Stress: Not on file  Social Connections: Not on file  Intimate Partner Violence: Not on file    Outpatient Medications Prior to Visit  Medication Sig Dispense Refill   cromolyn (OPTICROM) 4 % ophthalmic solution 1 drop 4 (four) times daily.     JUNEL FE 1.5/30 1.5-30 MG-MCG tablet TAKE 1 TABLET BY MOUTH DAILY 28 tablet 2   MOTEGRITY 2 MG TABS TAKE 1 TABLET(2 MG) BY MOUTH DAILY 30 tablet 3   ondansetron (ZOFRAN-ODT) 8 MG disintegrating tablet Take 1 tablet (8 mg total) by mouth every 8 (eight) hours as needed for nausea. 20 tablet 3   sertraline (ZOLOFT) 50 MG  tablet Take 1.5 tablets (75 mg total) by mouth at bedtime. 45 tablet 3   topiramate (TOPAMAX) 100 MG tablet Take 1 tablet (100 mg total) by mouth daily. 30 tablet 3   busPIRone (BUSPAR) 5 MG tablet Take 1 tablet (5 mg total) by mouth 2 (two) times daily as needed. For anxiety 60 tablet 3   No facility-administered medications prior to visit.    Allergies  Allergen Reactions   Penicillins Hives, Itching and Rash    Has patient had a PCN reaction causing immediate rash, facial/tongue/throat swelling, SOB or lightheadedness with hypotension: Yes Has patient had a PCN reaction causing severe rash involving mucus membranes or skin necrosis: Yes Has patient had a PCN reaction that required hospitalization no Has patient had a PCN reaction occurring within the last 10 years: No If all of the above answers are "NO", then may proceed with Cephalosporin use. Has patient had a PCN reaction causing immediate rash, facial/tongue/throat swelling, SOB or lightheadedness with hypotension: Yes Has patient had a PCN reaction causing severe rash involving mucus membranes or skin necrosis: Yes Has patient had a PCN reaction that required hospitalization no Has patient had a PCN reaction occurring within the last 10 years: No If all of the above answers are "NO", then may proceed with Cephalosporin use.    Review of Systems All review of systems negative except what is listed in the HPI     Objective:    Physical Exam Vitals and nursing note reviewed.  Constitutional:      Appearance: Normal appearance.  HENT:     Head: Normocephalic.  Eyes:     Extraocular Movements: Extraocular movements intact.     Conjunctiva/sclera: Conjunctivae normal.     Pupils: Pupils are equal, round, and reactive to light.  Cardiovascular:     Rate and Rhythm: Normal rate and regular rhythm.     Pulses: Normal pulses.     Heart sounds: Normal heart sounds. No murmur heard. Pulmonary:     Effort: Pulmonary effort is  normal.     Breath sounds: Normal breath sounds.  Abdominal:     General: Bowel sounds are normal.     Palpations: Abdomen is soft.  Musculoskeletal:        General: Normal range of motion.     Cervical back: Normal range of motion. No rigidity.     Right lower leg: No edema.     Left lower leg: No edema.  Skin:  General: Skin is warm and dry.  Neurological:     General: No focal deficit present.     Mental Status: She is alert and oriented to person, place, and time.     Cranial Nerves: No cranial nerve deficit.     Sensory: No sensory deficit.     Motor: No weakness.     Coordination: Coordination normal.     Gait: Gait normal.  Psychiatric:        Attention and Perception: Attention normal.        Mood and Affect: Mood is anxious.        Speech: Speech normal.        Behavior: Behavior normal.        Thought Content: Thought content normal.        Cognition and Memory: Cognition normal.        Judgment: Judgment normal.    BP 100/76    Pulse 91    Ht 4\' 8"  (1.422 m)    Wt 158 lb (71.7 kg)    SpO2 98%    BMI 35.42 kg/m  Wt Readings from Last 3 Encounters:  06/06/21 158 lb (71.7 kg)  05/22/21 156 lb (70.8 kg)  04/20/21 156 lb (70.8 kg)    Health Maintenance Due  Topic Date Due   Hepatitis C Screening  Never done   COVID-19 Vaccine (4 - Booster for Pfizer series) 07/26/2020    There are no preventive care reminders to display for this patient.   Lab Results  Component Value Date   TSH 1.64 08/11/2019   Lab Results  Component Value Date   WBC 10.0 11/07/2020   HGB 12.7 11/07/2020   HCT 38.7 11/07/2020   MCV 90.4 11/07/2020   PLT 369 11/07/2020   Lab Results  Component Value Date   NA 139 08/01/2020   K 3.8 08/01/2020   CO2 29 08/01/2020   GLUCOSE 102 (H) 08/01/2020   BUN 9 08/01/2020   CREATININE 0.60 08/01/2020   BILITOT 0.5 11/22/2020   ALKPHOS 41 11/22/2020   AST 15 11/22/2020   ALT 10 11/22/2020   PROT 7.2 11/22/2020   ALBUMIN 3.9  11/22/2020   CALCIUM 9.3 08/01/2020   ANIONGAP 7 06/04/2015   GFR 117.37 08/01/2020   Lab Results  Component Value Date   CHOL 217 (H) 08/11/2019   Lab Results  Component Value Date   HDL 49.20 08/11/2019   Lab Results  Component Value Date   LDLCALC 137 (H) 08/11/2019   Lab Results  Component Value Date   TRIG 154.0 (H) 08/11/2019   Lab Results  Component Value Date   CHOLHDL 4 08/11/2019   No results found for: HGBA1C     Assessment & Plan:   Problem List Items Addressed This Visit     Generalized anxiety disorder    Anxiety symptoms appear not well controlled at this time. We did increase her sertraline dose approximately 2 weeks ago.  Will obtain labs today and ensure that there are no additional concerns contributing to lack of control.  Symptoms consistent with panic. She has been missing doses of sertraline due to difficulty remembering to take this medication daily. Discussion on taking this medication routinely and recommendation on taking at same time as topamax to help with improved management. Will also add schedule dose of Buspar 5mg  daily with additional option to dose PRN 5mg  up to twice during the day if needed.  She expresses understanding of the changes.  Will  plan to follow-up once we have lab results back or if symptoms worsen or fail to improve.      Relevant Medications   busPIRone (BUSPAR) 5 MG tablet   Other Relevant Orders   CBC with Differential/Platelet   Comprehensive metabolic panel   Hemoglobin A1c   TSH   T3   T4   VITAMIN D 25 Hydroxy (Vit-D Deficiency, Fractures)   B12 and Folate Panel   Panic attack - Primary    Symptoms and presentation consistent with panic attack in the setting of increased anxiety. She is also experiencing paresthesias of the extremities during these periods. Evaluation of the patient determined that she is not currently taking her sertraline on a daily basis as she does forget to take this often.   Recommendations for taking medication at the same time as topamax to help prevent missed doses.  Also recommend trying Buspar on scheduled basis in the morning and adding 2 doses PRN during the day for acute symptoms.  Will plan to follow up once we have lab results back or sooner if symptoms worsen or fail to improve.       Relevant Medications   busPIRone (BUSPAR) 5 MG tablet   Other Relevant Orders   CBC with Differential/Platelet   Comprehensive metabolic panel   Hemoglobin A1c   TSH   T3   T4   VITAMIN D 25 Hydroxy (Vit-D Deficiency, Fractures)   B12 and Folate Panel   Paresthesia    Intermittent paresthesias in hands and feet most notably at times of increased anxiety/panic. No signs of weakness or decreased tendon reflexes present today. Skin coloration and capillary refill are appropriate.  She is able to move all extremities without limitation. Strongly suspect symptoms are related to increased anxiety and panic attack.  We will obtain labs today to rule out other etiologies as well.      Relevant Orders   CBC with Differential/Platelet   Comprehensive metabolic panel   Hemoglobin A1c   TSH   T3   T4   VITAMIN D 25 Hydroxy (Vit-D Deficiency, Fractures)   B12 and Folate Panel     Meds ordered this encounter  Medications   busPIRone (BUSPAR) 5 MG tablet    Sig: Take 1 tablet (5 mg total) by mouth daily. May also take 1 tablet (5 mg total) 2 (two) times daily as needed. For anxiety.    Dispense:  90 tablet    Refill:  3   Return for based on labs or if symptoms worsen before that time.   Lydia Render, NP

## 2021-06-06 NOTE — Patient Instructions (Addendum)
I am going to check some labs today to make sure that there is nothing else that is going on.   I do want to continue with the sertraline 75mg  once a day. You can take this with the headache medication to make it easier to remember.   You may also take one of the buspar at the same time with the other medication on a daily basis and then you may take up to more doses as needed during the day.   Try melatonin at bedtime and see if this helps with sleep. If not, please let me know.   If you do not notice any changes in the next 2 weeks- I want you to let me know. If you do feel better- you can let me know that as well.   We may need to consider some changes to your medication if we don't see improvement.

## 2021-06-06 NOTE — Assessment & Plan Note (Signed)
Symptoms and presentation consistent with panic attack in the setting of increased anxiety. She is also experiencing paresthesias of the extremities during these periods. Evaluation of the patient determined that she is not currently taking her sertraline on a daily basis as she does forget to take this often.  Recommendations for taking medication at the same time as topamax to help prevent missed doses.  Also recommend trying Buspar on scheduled basis in the morning and adding 2 doses PRN during the day for acute symptoms.  Will plan to follow up once we have lab results back or sooner if symptoms worsen or fail to improve.

## 2021-06-06 NOTE — Assessment & Plan Note (Signed)
Intermittent paresthesias in hands and feet most notably at times of increased anxiety/panic. No signs of weakness or decreased tendon reflexes present today. Skin coloration and capillary refill are appropriate.  She is able to move all extremities without limitation. Strongly suspect symptoms are related to increased anxiety and panic attack.  We will obtain labs today to rule out other etiologies as well.

## 2021-06-07 ENCOUNTER — Encounter (HOSPITAL_BASED_OUTPATIENT_CLINIC_OR_DEPARTMENT_OTHER): Payer: Self-pay | Admitting: Nurse Practitioner

## 2021-06-07 LAB — COMPREHENSIVE METABOLIC PANEL
ALT: 8 IU/L (ref 0–32)
AST: 11 IU/L (ref 0–40)
Albumin/Globulin Ratio: 1.8 (ref 1.2–2.2)
Albumin: 4.5 g/dL (ref 3.8–4.8)
Alkaline Phosphatase: 60 IU/L (ref 44–121)
BUN/Creatinine Ratio: 15 (ref 9–23)
BUN: 9 mg/dL (ref 6–20)
Bilirubin Total: 0.2 mg/dL (ref 0.0–1.2)
CO2: 22 mmol/L (ref 20–29)
Calcium: 9.8 mg/dL (ref 8.7–10.2)
Chloride: 103 mmol/L (ref 96–106)
Creatinine, Ser: 0.61 mg/dL (ref 0.57–1.00)
Globulin, Total: 2.5 g/dL (ref 1.5–4.5)
Glucose: 93 mg/dL (ref 70–99)
Potassium: 4.2 mmol/L (ref 3.5–5.2)
Sodium: 136 mmol/L (ref 134–144)
Total Protein: 7 g/dL (ref 6.0–8.5)
eGFR: 120 mL/min/{1.73_m2} (ref >59)

## 2021-06-07 LAB — B12 AND FOLATE PANEL
Folate: 6.5 ng/mL (ref >3.0)
Vitamin B-12: 565 pg/mL (ref 232–1245)

## 2021-06-07 LAB — HEMOGLOBIN A1C
Est. average glucose Bld gHb Est-mCnc: 111 mg/dL
Hgb A1c MFr Bld: 5.5 % (ref 4.8–5.6)

## 2021-06-07 LAB — T3: T3, Total: 133 ng/dL (ref 71–180)

## 2021-06-07 LAB — CBC WITH DIFFERENTIAL/PLATELET
Basophils Absolute: 0 10*3/uL (ref 0.0–0.2)
Basos: 0 %
EOS (ABSOLUTE): 0.1 10*3/uL (ref 0.0–0.4)
Eos: 1 %
Hematocrit: 40.5 % (ref 34.0–46.6)
Hemoglobin: 13.4 g/dL (ref 11.1–15.9)
Immature Grans (Abs): 0 10*3/uL (ref 0.0–0.1)
Immature Granulocytes: 0 %
Lymphocytes Absolute: 3.1 10*3/uL (ref 0.7–3.1)
Lymphs: 34 %
MCH: 29.6 pg (ref 26.6–33.0)
MCHC: 33.1 g/dL (ref 31.5–35.7)
MCV: 89 fL (ref 79–97)
Monocytes Absolute: 0.3 10*3/uL (ref 0.1–0.9)
Monocytes: 4 %
Neutrophils Absolute: 5.6 10*3/uL (ref 1.4–7.0)
Neutrophils: 61 %
Platelets: 442 10*3/uL (ref 150–450)
RBC: 4.53 x10E6/uL (ref 3.77–5.28)
RDW: 12.4 % (ref 11.7–15.4)
WBC: 9.1 10*3/uL (ref 3.4–10.8)

## 2021-06-07 LAB — VITAMIN D 25 HYDROXY (VIT D DEFICIENCY, FRACTURES): Vit D, 25-Hydroxy: 9.9 ng/mL — ABNORMAL LOW (ref 30.0–100.0)

## 2021-06-07 LAB — T4: T4, Total: 8.6 ug/dL (ref 4.5–12.0)

## 2021-06-07 LAB — TSH: TSH: 1.73 u[IU]/mL (ref 0.450–4.500)

## 2021-06-10 ENCOUNTER — Other Ambulatory Visit (HOSPITAL_BASED_OUTPATIENT_CLINIC_OR_DEPARTMENT_OTHER): Payer: Self-pay | Admitting: Nurse Practitioner

## 2021-06-10 DIAGNOSIS — E559 Vitamin D deficiency, unspecified: Secondary | ICD-10-CM

## 2021-06-10 MED ORDER — VITAMIN D (ERGOCALCIFEROL) 1.25 MG (50000 UNIT) PO CAPS: 50000.0000 [IU] | ORAL_CAPSULE | ORAL | 0 refills | Status: DC

## 2021-06-11 NOTE — Progress Notes (Signed)
Called pt and scheduled for 09/10/21 for labs

## 2021-07-02 ENCOUNTER — Other Ambulatory Visit: Payer: Self-pay | Admitting: Psychiatry

## 2021-07-02 ENCOUNTER — Encounter: Payer: Self-pay | Admitting: Psychiatry

## 2021-07-02 ENCOUNTER — Ambulatory Visit: Payer: Medicaid Other | Admitting: Psychiatry

## 2021-07-02 ENCOUNTER — Telehealth: Payer: Self-pay | Admitting: Psychiatry

## 2021-07-02 VITALS — BP 105/71 | HR 83 | Ht <= 58 in | Wt 156.6 lb

## 2021-07-02 DIAGNOSIS — G4489 Other headache syndrome: Secondary | ICD-10-CM | POA: Diagnosis not present

## 2021-07-02 DIAGNOSIS — R519 Headache, unspecified: Secondary | ICD-10-CM

## 2021-07-02 DIAGNOSIS — H538 Other visual disturbances: Secondary | ICD-10-CM

## 2021-07-02 NOTE — Patient Instructions (Signed)
MRI and MRV brain Schedule an appointment with eye doctor Continue Topamax 100 mg daily for now

## 2021-07-02 NOTE — Progress Notes (Signed)
Referring:  [name], NP [address] [place],  [place] [address]  PCP: [name], NP  Neurology was asked to evaluate Lydia [name], a 36 year old female for a chief complaint of headaches.  Our recommendations of care will be communicated by shared medical record.    CC:  headaches  HPI:  Medical co-morbidities: vitamin D deficiency, viral meningitis (2006)  The patient presents for evaluation of daily headaches which began at age 55 following viral meningitis. 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 CSF showed 189 WBC (lymphocytic predominance), 58 glucose, 88 protein, opening pressure of 41. She was started on Diamox 500 mg BID which did improve her headaches. Stopped this when she lost insurance.  Headaches improved for a while, then started to worsen again 1-2 years ago. They are described as occipital pounding pain with associated photophobia, phonophobia, and nausea. They are near-constant. She was started Topamax 2 months ago which has helped reduce the headaches slightly. She increased her dose to 100 mg daily 2-3 weeks ago and now has a few headache free days per month.  Headache History: Onset: first at age 34, worsened 1-2 years ago Triggers: stress, sneezing Aura: spots lasting 1-2 minutes Location: occiput Quality/Description: pounding, throbbing Associated Symptoms:  Photophobia: yes  Phonophobia: yes  Nausea: yes Vomiting: no Worse with activity?: yes Duration of headaches: constant  Pregnancy planning/birth control: OCPs  Headache days per month: 26 Headache free days per month: 4  Current Treatment: Abortive none  Preventative Topamax 100 mg daily  Prior Therapies                                 Diamox Topamax 100 mg daily Amitriptyline 50 mg QHS Zoloft 75 mg daily Zofran Tylenol Ibuprofen  LABS: CBC    Component Value Date/Time   WBC 9.1  06/06/2021 1042   WBC 10.0 11/07/2020 0838   RBC 4.53 06/06/2021 1042   RBC 4.28 11/07/2020 0838   HGB 13.4 06/06/2021 1042   HCT 40.5 06/06/2021 1042   PLT 442 06/06/2021 1042   MCV 89 06/06/2021 1042   MCH 29.6 06/06/2021 1042   MCH 29.7 11/07/2020 0838   MCHC 33.1 06/06/2021 1042   MCHC 32.8 11/07/2020 0838   RDW 12.4 06/06/2021 1042   LYMPHSABS 3.1 06/06/2021 1042   MONOABS 0.4 08/01/2020 1403   EOSABS 0.1 06/06/2021 1042   BASOSABS 0.0 06/06/2021 1042   CMP Latest Ref Rng & Units 06/06/2021 11/22/2020 08/01/2020  Glucose 70 - 99 mg/dL 93 - 102(H)  BUN 6 - 20 mg/dL 9 - 9  Creatinine 0.57 - 1.00 mg/dL 0.61 - 0.60  Sodium 134 - 144 mmol/L 136 - 139  Potassium 3.5 - 5.2 mmol/L 4.2 - 3.8  Chloride 96 - 106 mmol/L 103 - 104  CO2 20 - 29 mmol/L 22 - 29  Calcium 8.7 - 10.2 mg/dL 9.8 - 9.3  Total Protein 6.0 - 8.5 g/dL 7.0 7.2 6.8  Total Bilirubin 0.0 - 1.2 mg/dL 0.2 0.5 0.3  Alkaline Phos 44 - 121 IU/L 60 41 36(L)  AST 0 - 40 IU/L 11 15 11   ALT 0 - 32 IU/L 8 10 7      IMAGING:  MRI brain 2006: diffuse leptomeningeal enhancement  MRV 2006: congenitally small transverse sinuses  MRI/MRA brain 2009: unremarkable  Imaging independently reviewed on July 02, 2021  Current Outpatient Medications on File Prior to Visit  Medication Sig Dispense Refill   busPIRone (BUSPAR) 5 MG tablet Take 1 tablet (5 mg total) by mouth daily. May also take 1 tablet (5 mg total) 2 (two) times daily as needed. For anxiety. 90 tablet 3   cromolyn (OPTICROM) 4 % ophthalmic solution 1 drop 4 (four) times daily.     JUNEL FE 1.5/30 1.5-30 MG-MCG tablet TAKE 1 TABLET BY MOUTH DAILY 28 tablet 2   MOTEGRITY 2 MG TABS TAKE 1 TABLET(2 MG) BY MOUTH DAILY 30 tablet 3   ondansetron (ZOFRAN-ODT) 8 MG disintegrating tablet Take 1 tablet (8 mg total) by mouth every 8 (eight) hours as needed for nausea. 20 tablet 3   sertraline (ZOLOFT) 50 MG tablet Take 1.5 tablets (75 mg total) by mouth at bedtime. 45 tablet  3   topiramate (TOPAMAX) 100 MG tablet Take 1 tablet (100 mg total) by mouth daily. 30 tablet 3   Vitamin D, Ergocalciferol, (DRISDOL) 1.25 MG (50000 UNIT) CAPS capsule Take 1 capsule (50,000 Units total) by mouth every 7 (seven) days. Take for 12 total doses(weeks) 12 capsule 0   No current facility-administered medications on file prior to visit.     Allergies: Allergies  Allergen Reactions   Penicillins Hives, Itching and Rash    Has patient had a PCN reaction causing immediate rash, facial/tongue/throat swelling, SOB or lightheadedness with hypotension: Yes Has patient had a PCN reaction causing severe rash involving mucus membranes or skin necrosis: Yes Has patient had a PCN reaction that required hospitalization no Has patient had a PCN reaction occurring within the last 10 years: No If all of the above answers are "NO", then may proceed with Cephalosporin use. Has patient had a PCN reaction causing immediate rash, facial/tongue/throat swelling, SOB or lightheadedness with hypotension: Yes Has patient had a PCN reaction causing severe rash involving mucus membranes or skin necrosis: Yes Has patient had a PCN reaction that required hospitalization no Has patient had a PCN reaction occurring within the last 10 years: No If all of the above answers are "NO", then may proceed with Cephalosporin use.    Family History: Migraine or other headaches in the family:  mother and maternal aunt Aneurysms in a first degree relative:  no Brain tumors in the family:  no Other neurological illness in the family:   no  Past Medical History: Past Medical History:  Diagnosis Date   Abdominal pain, epigastric 01/26/2014   Acute recurrent maxillary sinusitis 03/11/2014   Allergy    Anxiety    Chronic constipation    Motegrity helps    Chronic headaches    none x 2-3 yrs   GERD (gastroesophageal reflux disease)    HEADACHE 11/30/2007   Qualifier: Diagnosis of  By: Grier Rocher MD, Madaline Guthrie    Infection    UTI   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/22/2020   Meningitis    Ovarian cyst    Pain 12/08/2013   Dyspareunia    Pelvic pain in female 07/06/2013    Past Surgical History Past Surgical History:  Procedure Laterality Date   CHOLECYSTECTOMY  2016   COLONOSCOPY     LAPAROSCOPIC APPENDECTOMY N/A 11/10/2020   Procedure: LAPAROSCOPIC APPENDECTOMY;  Surgeon: Mickeal Skinner, MD;  Location: [facility];  Service: General;  Laterality: N/A;   UPPER GASTROINTESTINAL ENDOSCOPY  2015   WISDOM TOOTH EXTRACTION      Social History: Social History   Tobacco Use  Smoking status: Never   Smokeless tobacco: Never  Vaping Use   Vaping Use: Never used  Substance Use Topics   Alcohol use: No    Alcohol/week: 0.0 standard drinks   Drug use: No     ROS: Negative for fevers, chills. Positive for headaches. All other systems reviewed and negative unless stated otherwise in HPI.   Physical Exam:   Vital Signs: BP 105/71    Pulse 83    Ht 4\' 8"  (1.422 m)    Wt 156 lb 9.6 oz (71 kg)    BMI 35.11 kg/m  GENERAL: well appearing,in no acute distress,alert SKIN:  Color, texture, turgor normal. No rashes or lesions HEAD:  Normocephalic/atraumatic. CV:  RRR RESP: Normal respiratory effort MSK: no tenderness to palpation over occiput, neck, or shoulders  NEUROLOGICAL: Mental Status: Alert, oriented to person, place and time,Follows commands Cranial Nerves: PERRL, fundi poorly visualized due to pupillary constriction, visual fields intact to confrontation,extraocular movements intact,facial sensation intact,no facial droop or ptosis,hearing grossly intact,no dysarthria Motor: muscle strength 5/5 both upper and lower extremities,no drift, normal tone Reflexes: 2+ throughout Sensation: intact to light touch all 4 extremities Coordination: Finger-to- nose-finger intact bilaterally Gait: normal-based   IMPRESSION: 36 year old female with a history of vitamin D deficiency, viral  meningitis (2006) who presents for evaluation of worsening headaches over the past 1-2 years. While previous notes mention a diagnosis of IIH, suspect her elevated opening pressure in 2006 was secondary to viral meningitis given elevated white count and leptomeningeal enhancement on MRI at that time. Will order MRI/MRV as she does currently endorse near constant headaches which worsen with valsalva. She will also make an appointment with her eye doctor for a formal fundus exam to assess for papilledema. Will continue Topamax at 100 mg daily for now as she just recently increased the dose. Can consider increasing dose further if needed, but would avoid doses >200 mg daily as this can interact with OCPs.  PLAN: -MRI/MRV brain -She will schedule an appointment with her eye doctor for formal eye exam -Continue Topamax 100 mg daily for now  I spent a total of 38 minutes chart reviewing and counseling the patient. Headache education was done. Discussed treatment options including preventive and acute medications, natural supplements, and physical therapy. Discussed medication overuse headache and to limit use of acute treatments to no more than 2 days/week or 10 days/month. Discussed medication side effects, adverse reactions and drug interactions. Written educational materials and patient instructions outlining all of the above were given.  Follow-up: 3-4 months   Genia Harold, MD 07/02/2021   12:57 PM

## 2021-07-02 NOTE — Telephone Encounter (Signed)
[facility]: [phone] & [phone] (exp. 07/02/21 to 08/16/21)  I sent Olin Hauser and [place] a message with GI to schedule the patient as soon as possible.

## 2021-07-03 ENCOUNTER — Telehealth: Payer: Self-pay | Admitting: Psychiatry

## 2021-07-03 NOTE — Telephone Encounter (Signed)
Referral sent to [facility] [phone].

## 2021-07-04 ENCOUNTER — Ambulatory Visit
Admission: RE | Admit: 2021-07-04 | Discharge: 2021-07-04 | Disposition: A | Discharge status: Home / Self Care | Payer: Medicaid Other | Source: Ambulatory Visit | Attending: Psychiatry | Admitting: Psychiatry

## 2021-07-04 DIAGNOSIS — R519 Headache, unspecified: Secondary | ICD-10-CM | POA: Diagnosis not present

## 2021-07-04 DIAGNOSIS — G4489 Other headache syndrome: Secondary | ICD-10-CM

## 2021-07-04 MED ORDER — GADOBENATE DIMEGLUMINE 529 MG/ML IV SOLN
12.0000 mL | Freq: Once | INTRAVENOUS | Status: AC | PRN
  Administered 2021-07-04: 12 mL via INTRAVENOUS

## 2021-07-05 ENCOUNTER — Encounter (HOSPITAL_BASED_OUTPATIENT_CLINIC_OR_DEPARTMENT_OTHER): Payer: Self-pay | Admitting: Nurse Practitioner

## 2021-07-06 NOTE — Telephone Encounter (Signed)
Pt is sch for 2/27 at 10:50 for virtual vist

## 2021-07-09 ENCOUNTER — Encounter (HOSPITAL_BASED_OUTPATIENT_CLINIC_OR_DEPARTMENT_OTHER): Payer: Self-pay | Admitting: Nurse Practitioner

## 2021-07-09 ENCOUNTER — Ambulatory Visit (INDEPENDENT_AMBULATORY_CARE_PROVIDER_SITE_OTHER): Payer: Medicaid Other | Admitting: Nurse Practitioner

## 2021-07-09 ENCOUNTER — Other Ambulatory Visit: Payer: Self-pay

## 2021-07-09 DIAGNOSIS — G4700 Insomnia, unspecified: Secondary | ICD-10-CM

## 2021-07-09 MED ORDER — HYDROXYZINE PAMOATE 50 MG PO CAPS: 50.0000 mg | ORAL_CAPSULE | Freq: Every evening | ORAL | 6 refills | Status: DC | PRN

## 2021-07-09 NOTE — Progress Notes (Signed)
Virtual Visit Encounter  telephone visit.   I connected with  Lydia [name] on 07/09/21 at 10:50 AM EST by secure audio and/or video enabled telemedicine application. I verified that I am speaking with the correct person using two identifiers.   I introduced myself as a Designer, jewellery with the practice. The limitations of evaluation and management by telemedicine discussed with the patient and the availability of in person appointments. The patient expressed verbal understanding and consent to proceed.  Participating parties in this visit include: Myself and patient  The patient is: Patient Location: Home I am: Provider Location: Office/Clinic Subjective:    CC and HPI: Lydia [name] is a 36 y.o. year old female presenting for new evaluation and treatment of insomnia. Lydia [name] tells me that for the past 1-2 weeks she has been experiencing difficulty sleeping at night. She reports that she will toss and turn at night and can't seem to settle her mind. She tells me this happens sometimes but usually no more than 1-2 times a week. It has been happening daily most recently. She tells me she did have the MRI of her head with neurology and she feels that having this and waiting on the results has triggered the more frequent episodes. She is anxious about the results and the next steps.   Past medical history, Surgical history, Family history not pertinant except as noted below, Social history, Allergies, and medications have been entered into the medical record, reviewed, and corrections made.   Review of Systems:  All review of systems negative except what is listed in the HPI  Objective:    Alert and oriented x 4 Speaking in clear sentences with no shortness of breath. No distress.  Impression and Recommendations:    Problem List Items Addressed This Visit     Insomnia - Primary    Difficulty falling asleep related to anxiety symptoms triggered by recent MRI.  No alarm symptoms noted at  this time.  Will trial hydroxyzine 50-100mg  at bedtime for sleep on an as needed basis.  She may continue this medication if it is effective for her. She will follow-up if her symptoms do not improve with use.       Relevant Medications   hydrOXYzine (VISTARIL) 50 MG capsule    orders and follow up as documented in EMR I discussed the assessment and treatment plan with the patient. The patient was provided an opportunity to ask questions and all were answered. The patient agreed with the plan and demonstrated an understanding of the instructions.   The patient was advised to call back or seek an in-person evaluation if the symptoms worsen or if the condition fails to improve as anticipated.  Follow-Up: prn  I provided  16 minutes of non-face-to-face interaction with this non face-to-face encounter including intake, same-day documentation, and chart review.   [name], NP , DNP, AGNP-c [place] [facility] [phone] (fax)

## 2021-07-09 NOTE — Patient Instructions (Signed)
I have sent in hydroxyzine 50mg  to help you sleep. You can take this nightly or as needed for times when you are not sleeping well. If you do not feel this is helping, please let me know and we can look at other options.

## 2021-07-09 NOTE — Assessment & Plan Note (Signed)
Difficulty falling asleep related to anxiety symptoms triggered by recent MRI.  No alarm symptoms noted at this time.  Will trial hydroxyzine 50-100mg  at bedtime for sleep on an as needed basis.  She may continue this medication if it is effective for her. She will follow-up if her symptoms do not improve with use.

## 2021-07-10 ENCOUNTER — Other Ambulatory Visit (HOSPITAL_COMMUNITY)
Admission: RE | Admit: 2021-07-10 | Discharge: 2021-07-10 | Disposition: A | Discharge status: Home / Self Care | Payer: Medicaid Other | Source: Ambulatory Visit | Attending: Obstetrics | Admitting: Obstetrics

## 2021-07-10 ENCOUNTER — Ambulatory Visit (INDEPENDENT_AMBULATORY_CARE_PROVIDER_SITE_OTHER): Payer: Medicaid Other | Admitting: Obstetrics

## 2021-07-10 ENCOUNTER — Encounter: Payer: Self-pay | Admitting: Obstetrics

## 2021-07-10 VITALS — BP 109/74 | HR 79 | Ht <= 58 in | Wt 154.0 lb

## 2021-07-10 DIAGNOSIS — Z01419 Encounter for gynecological examination (general) (routine) without abnormal findings: Secondary | ICD-10-CM

## 2021-07-10 DIAGNOSIS — Z3041 Encounter for surveillance of contraceptive pills: Secondary | ICD-10-CM | POA: Diagnosis not present

## 2021-07-10 MED ORDER — JUNEL FE 1.5/30 1.5-30 MG-MCG PO TABS: 1.0000 | ORAL_TABLET | Freq: Every day | ORAL | 11 refills | Status: DC

## 2021-07-10 NOTE — Progress Notes (Signed)
Subjective:        Lydia [name] is a 36 y.o. female here for a routine exam.  Current complaints: None.    Personal health questionnaire:  Is [name], have a family history of breast and/or ovarian cancer: no Is there a family history of uterine cancer diagnosed at age < 72, gastrointestinal cancer, urinary tract cancer, family member who is a Field seismologist syndrome-associated carrier: no Is the patient overweight and hypertensive, family history of diabetes, personal history of gestational diabetes, preeclampsia or PCOS: no Is patient over 70, have PCOS,  family history of premature CHD under age 52, diabetes, smoke, have hypertension or peripheral artery disease:  no At any time, has a partner hit, kicked or otherwise hurt or frightened you?: no Over the past 2 weeks, have you felt down, depressed or hopeless?: no Over the past 2 weeks, have you felt little interest or pleasure in doing things?:no   Gynecologic History Patient's last menstrual period was 06/24/2021 (exact date). Contraception: OCP (estrogen/progesterone) Last Pap: 11-10-2020. Results were: normal Last mammogram: n/a. Results were: n/a  Obstetric History OB History  Gravida Para Term Preterm AB Living  4 3 3  0 1 3  SAB IAB Ectopic Multiple Live Births  1 0 0 0 3    # Outcome Date GA Lbr Len/2nd Weight Sex Delivery Anes PTL Lv  4 Term 08/03/15 [redacted]w[redacted]d 20:25 / 00:40 8 lb 1.5 oz (3.671 kg) M Vag-Spont EPI  LIV  3 Term 12/30/10 [redacted]w[redacted]d 10:50 / 00:29 6 lb 1.4 oz (2.761 kg) F Vag-Spont EPI  LIV     Birth Comments: left arm below elbow missing  2 Term 11/28/04 [redacted]w[redacted]d  6 lb 14 oz (3.118 kg) F Vag-Spont EPI  LIV  1 SAB             Past Medical History:  Diagnosis Date   Abdominal pain, epigastric 01/26/2014   Acute recurrent maxillary sinusitis 03/11/2014   Allergy    Anxiety    Chronic constipation    Motegrity helps    Chronic headaches    none x 2-3 yrs   GERD (gastroesophageal reflux disease)     HEADACHE 11/30/2007   Qualifier: Diagnosis of  By: Grier Rocher MD, Madaline Guthrie    Infection    UTI   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/22/2020   Meningitis    Ovarian cyst    Pain 12/08/2013   Dyspareunia    Pelvic pain in female 07/06/2013    Past Surgical History:  Procedure Laterality Date   CHOLECYSTECTOMY  2016   COLONOSCOPY     LAPAROSCOPIC APPENDECTOMY N/A 11/10/2020   Procedure: LAPAROSCOPIC APPENDECTOMY;  Surgeon: Mickeal Skinner, MD;  Location: [facility];  Service: General;  Laterality: N/A;   UPPER GASTROINTESTINAL ENDOSCOPY  2015   WISDOM TOOTH EXTRACTION       Current Outpatient Medications:    busPIRone (BUSPAR) 5 MG tablet, Take 1 tablet (5 mg total) by mouth daily. May also take 1 tablet (5 mg total) 2 (two) times daily as needed. For anxiety., Disp: 90 tablet, Rfl: 3   cromolyn (OPTICROM) 4 % ophthalmic solution, 1 drop 4 (four) times daily., Disp: , Rfl:    hydrOXYzine (VISTARIL) 50 MG capsule, Take 1 capsule (50 mg total) by mouth at bedtime and may repeat dose one time if needed. For sleep and anxiety., Disp: 60 capsule, Rfl: 6   JUNEL FE 1.5/30 1.5-30 MG-MCG tablet, Take 1 tablet by mouth  daily., Disp: 28 tablet, Rfl: 11   MOTEGRITY 2 MG TABS, TAKE 1 TABLET(2 MG) BY MOUTH DAILY, Disp: 30 tablet, Rfl: 3   ondansetron (ZOFRAN-ODT) 8 MG disintegrating tablet, Take 1 tablet (8 mg total) by mouth every 8 (eight) hours as needed for nausea., Disp: 20 tablet, Rfl: 3   sertraline (ZOLOFT) 50 MG tablet, Take 1.5 tablets (75 mg total) by mouth at bedtime., Disp: 45 tablet, Rfl: 3   topiramate (TOPAMAX) 100 MG tablet, Take 1 tablet (100 mg total) by mouth daily., Disp: 30 tablet, Rfl: 3   Vitamin D, Ergocalciferol, (DRISDOL) 1.25 MG (50000 UNIT) CAPS capsule, Take 1 capsule (50,000 Units total) by mouth every 7 (seven) days. Take for 12 total doses(weeks), Disp: 12 capsule, Rfl: 0 Allergies  Allergen Reactions   Penicillins Hives, Itching and Rash    Has patient had a PCN reaction  causing immediate rash, facial/tongue/throat swelling, SOB or lightheadedness with hypotension: Yes Has patient had a PCN reaction causing severe rash involving mucus membranes or skin necrosis: Yes Has patient had a PCN reaction that required hospitalization no Has patient had a PCN reaction occurring within the last 10 years: No If all of the above answers are "NO", then may proceed with Cephalosporin use. Has patient had a PCN reaction causing immediate rash, facial/tongue/throat swelling, SOB or lightheadedness with hypotension: Yes Has patient had a PCN reaction causing severe rash involving mucus membranes or skin necrosis: Yes Has patient had a PCN reaction that required hospitalization no Has patient had a PCN reaction occurring within the last 10 years: No If all of the above answers are "NO", then may proceed with Cephalosporin use.    Social History   Tobacco Use   Smoking status: Never   Smokeless tobacco: Never  Substance Use Topics   Alcohol use: No    Alcohol/week: 0.0 standard drinks    Family History  Problem Relation Age of Onset   Migraines Mother    Hypertension Mother    Asthma Father    Birth defects Daughter        Limb reduction defect   Breast cancer Maternal Aunt 63   Colon cancer Neg Hx    Esophageal cancer Neg Hx    Rectal cancer Neg Hx    Stomach cancer Neg Hx    Colon polyps Neg Hx       Review of Systems  Constitutional: negative for fatigue and weight loss Respiratory: negative for cough and wheezing Cardiovascular: negative for chest pain, fatigue and palpitations Gastrointestinal: negative for abdominal pain and change in bowel habits Musculoskeletal:negative for myalgias Neurological: negative for gait problems and tremors Behavioral/Psych: negative for abusive relationship, depression Endocrine: negative for temperature intolerance    Genitourinary:negative for abnormal menstrual periods, genital lesions, hot flashes, sexual problems  and vaginal discharge Integument/breast: negative for breast lump, breast tenderness, nipple discharge and skin lesion(s)    Objective:       BP 109/74    Pulse 79    Ht 4\' 8"  (1.422 m)    Wt 154 lb (69.9 kg)    LMP 06/24/2021 (Exact Date)    BMI 34.53 kg/m  General:   alert  Skin:   no rash or abnormalities  Lungs:   clear to auscultation bilaterally  Heart:   regular rate and rhythm, S1, S2 normal, no murmur, click, rub or gallop  Breasts:   normal without suspicious masses, skin or nipple changes or axillary nodes  Abdomen:  normal findings: no organomegaly,  soft, non-tender and no hernia  Pelvis:  External genitalia: normal general appearance Urinary system: urethral meatus normal and bladder without fullness, nontender Vaginal: normal without tenderness, induration or masses Cervix: normal appearance Adnexa: normal bimanual exam Uterus: anteverted and non-tender, normal size   Lab Review Urine pregnancy test Labs reviewed yes Radiologic studies reviewed no  I have spent a total of 20 minutes of face-to-face time, excluding clinical staff time, reviewing notes and preparing to see patient, ordering tests and/or medications, and counseling the patient.   Assessment:    1. Encounter for routine gynecological examination with Papanicolaou smear of cervix Rx: - Cytology - PAP( [facility])  2. Encounter for surveillance of contraceptive pills Rx: - JUNEL FE 1.5/30 1.5-30 MG-MCG tablet; Take 1 tablet by mouth daily.  Dispense: 28 tablet; Refill: 11     Plan:    Education reviewed: calcium supplements, depression evaluation, low fat, low cholesterol diet, safe sex/STD prevention, self breast exams, and weight bearing exercise. Contraception: OCP (estrogen/progesterone). Follow up in: 1 year.   Meds ordered this encounter  Medications   JUNEL FE 1.5/30 1.5-30 MG-MCG tablet    Sig: Take 1 tablet by mouth daily.    Dispense:  28 tablet    Refill:  11    ZERO refills  remain on this prescription. Your patient is requesting advance approval of refills for this medication to [place], MD 07/10/2021 2:53 PM

## 2021-07-10 NOTE — Progress Notes (Signed)
GYN presents for AEX/PAP.

## 2021-07-19 LAB — CYTOLOGY - PAP
Comment: NEGATIVE
Diagnosis: NEGATIVE
High risk HPV: NEGATIVE

## 2021-07-30 ENCOUNTER — Other Ambulatory Visit: Payer: Self-pay | Admitting: Psychiatry

## 2021-07-30 ENCOUNTER — Encounter: Payer: Self-pay | Admitting: Psychiatry

## 2021-07-30 MED ORDER — AJOVY 225 MG/1.5ML ~~LOC~~ SOAJ: 1.0000 "pen " | SUBCUTANEOUS | 3 refills | Status: DC

## 2021-08-01 ENCOUNTER — Telehealth: Payer: Self-pay

## 2021-08-01 NOTE — Telephone Encounter (Signed)
PA for ajovy has been sent via cmm (Key: [id number]) ? ?Your information has been sent to [employer]. ?

## 2021-08-02 ENCOUNTER — Other Ambulatory Visit: Payer: Self-pay | Admitting: Psychiatry

## 2021-08-02 MED ORDER — EMGALITY 120 MG/ML ~~LOC~~ SOAJ: 1.0000 "pen " | SUBCUTANEOUS | 3 refills | Status: DC

## 2021-08-02 MED ORDER — EMGALITY 120 MG/ML ~~LOC~~ SOAJ: 2.0000 "pen " | Freq: Once | SUBCUTANEOUS | 0 refills | Status: AC

## 2021-08-02 NOTE — Telephone Encounter (Signed)
Emgality has been approved Request Reference Number: [id number]. EMGALITY INJ '120MG'$ /ML is approved through 11/02/2021. For further questions, call [employer] at [phone]. ? ?Pt notified of approval.  ?

## 2021-08-02 NOTE — Telephone Encounter (Signed)
Rx for emgality sent to her pharmacy, thanks

## 2021-08-02 NOTE — Telephone Encounter (Addendum)
PA for ajovy was denied; MD switched to emgality I have submitted for consideration.  ? ?(Key: [id number]) ? ?[employer] is reviewing your PA request. Typically an electronic response will be received within 24-72 hours. To check for an update later, open this request from your dashboard. ? ?You may close this dialog and return to your dashboard to perform other tasks. ?

## 2021-08-02 NOTE — Telephone Encounter (Signed)
PA for ajovy has been denied. Preferred alternatives are emgality and ajovy.  ? ?

## 2021-08-06 NOTE — Telephone Encounter (Signed)
Yes please have her continue the topamax for now

## 2021-08-09 ENCOUNTER — Other Ambulatory Visit (HOSPITAL_BASED_OUTPATIENT_CLINIC_OR_DEPARTMENT_OTHER): Payer: Self-pay | Admitting: Nurse Practitioner

## 2021-08-09 DIAGNOSIS — R519 Headache, unspecified: Secondary | ICD-10-CM

## 2021-08-20 ENCOUNTER — Telehealth (INDEPENDENT_AMBULATORY_CARE_PROVIDER_SITE_OTHER): Payer: Medicaid Other | Admitting: Nurse Practitioner

## 2021-08-20 ENCOUNTER — Encounter (HOSPITAL_BASED_OUTPATIENT_CLINIC_OR_DEPARTMENT_OTHER): Payer: Self-pay | Admitting: Nurse Practitioner

## 2021-08-20 DIAGNOSIS — N941 Unspecified dyspareunia: Secondary | ICD-10-CM | POA: Diagnosis not present

## 2021-08-20 DIAGNOSIS — R519 Headache, unspecified: Secondary | ICD-10-CM

## 2021-08-20 DIAGNOSIS — F5104 Psychophysiologic insomnia: Secondary | ICD-10-CM

## 2021-08-20 DIAGNOSIS — F410 Panic disorder [episodic paroxysmal anxiety] without agoraphobia: Secondary | ICD-10-CM

## 2021-08-20 DIAGNOSIS — N946 Dysmenorrhea, unspecified: Secondary | ICD-10-CM | POA: Diagnosis not present

## 2021-08-20 DIAGNOSIS — N921 Excessive and frequent menstruation with irregular cycle: Secondary | ICD-10-CM

## 2021-08-20 MED ORDER — NORGESTREL-ETHINYL ESTRADIOL 0.5-50 MG-MCG PO TABS: 1.0000 | ORAL_TABLET | Freq: Every day | ORAL | 3 refills | Status: DC

## 2021-08-20 NOTE — Assessment & Plan Note (Signed)
Significant improvement with emgality and topiramate. Plan to continue both medications at this time and monitor. Will change OCP to higher dose while on topiramate and encouraged patient to use back up contraception to protect against unwanted pregnancy in the even topiramate lessens effectiveness of OCP.  ?Will follow.  ?

## 2021-08-20 NOTE — Assessment & Plan Note (Signed)
Suspect topiramate is contributing to decreased effectiveness of OCP. Patient would like to stay on topiramate.  ?Discussed option to trial increased dose of OCP to see if this helps prevent breakthrough bleeding. Discussed importance of using additional contraception to prevent pregnancy. Patient expressed understanding. Will send new OCP dose and follow.  ?

## 2021-08-20 NOTE — Progress Notes (Signed)
Virtual Visit Encounter telephone visit. ? ? ?I connected with  Lydia [name] on 08/20/21 at  3:50 PM EDT by secure audio telemedicine application. I verified that I am speaking with the correct person using two identifiers. ?  ?I introduced myself as a Designer, jewellery with the practice. The limitations of evaluation and management by telemedicine discussed with the patient and the availability of in person appointments. The patient expressed verbal understanding and consent to proceed. ? ?Participating parties in this visit include: Myself and patient ? ?The patient is: Patient Location: Other:  car ?I am: Provider Location: Office/Clinic ?Subjective:   ? ?CC and HPI: Lydia [name] is a 36 y.o. year old female presenting for follow up of mood. ?Lydia [name] reports her medication is working well for her and she feels her mood has improved.  ?She is sleeping well and using the hydroxyzine as needed.  ?She denies panic attack or increased anxiety.  ? ?She also tells me that her migraines have resolved with use of both injectable medication (emgality) and topiramate.  ?She reports that she has had some vaginal spotting with the topiramate and is concerned about her OCP.  ?She wants to stay on her topiramate as she has had success with the results of her migraine.  ? ?Past medical history, Surgical history, Family history not pertinant except as noted below, Social history, Allergies, and medications have been entered into the medical record, reviewed, and corrections made.  ? ?Review of Systems:  ?All review of systems negative except what is listed in the HPI ? ?Objective:   ? ?Alert and oriented x 4 ?Speaking in clear sentences with no shortness of breath. ?No distress. ? ?Impression and Recommendations:   ? ?Problem List Items Addressed This Visit   ? ? Breakthrough bleeding on birth control pills - Primary  ?  Suspect topiramate is contributing to decreased effectiveness of OCP. Patient would like to stay on topiramate.   ?Discussed option to trial increased dose of OCP to see if this helps prevent breakthrough bleeding. Discussed importance of using additional contraception to prevent pregnancy. Patient expressed understanding. Will send new OCP dose and follow.  ?  ?  ? Relevant Medications  ? norgestrel-ethinyl estradiol (OGESTROL) 0.5-50 MG-MCG tablet  ? Chronic daily headache  ?  Significant improvement with emgality and topiramate. Plan to continue both medications at this time and monitor. Will change OCP to higher dose while on topiramate and encouraged patient to use back up contraception to protect against unwanted pregnancy in the even topiramate lessens effectiveness of OCP.  ?Will follow.  ?  ?  ? Relevant Medications  ? norgestrel-ethinyl estradiol (OGESTROL) 0.5-50 MG-MCG tablet  ? Panic attack  ?  Panic symptoms and mood significantly improved with sertraline and buspirone daily. She is doing well without side effects from medication. Recommend continuation of current treatment. Will plan to follow in 6 months or sooner if needed.  ?  ?  ? Insomnia  ?  Improved symptoms with PRN hydroxyzine and control of panic symptoms. No alarm symptoms present today. Continue current treatment and monitor for new or worsening symptoms and report immediately. F/U in 6 months or sooner if needed.  ?  ?  ? Dyspareunia, female  ? Relevant Medications  ? norgestrel-ethinyl estradiol (OGESTROL) 0.5-50 MG-MCG tablet  ? ? ?orders and follow up as documented in EMR ?I discussed the assessment and treatment plan with the patient. The patient was provided an opportunity to ask questions and all  were answered. The patient agreed with the plan and demonstrated an understanding of the instructions. ?  ?The patient was advised to call back or seek an in-person evaluation if the symptoms worsen or if the condition fails to improve as anticipated. ? ?Follow-Up: in 6 months ? ?I provided 18 minutes of non-face-to-face interaction with this non  face-to-face encounter including intake, same-day documentation, and chart review.  ? ?[name], NP , DNP, AGNP-c ?[facility] ?Primary Care & Sports Medicine at [facility] ?[phone] ?[phone] (fax) ? ?

## 2021-08-20 NOTE — Assessment & Plan Note (Signed)
Panic symptoms and mood significantly improved with sertraline and buspirone daily. She is doing well without side effects from medication. Recommend continuation of current treatment. Will plan to follow in 6 months or sooner if needed.  ?

## 2021-08-20 NOTE — Assessment & Plan Note (Signed)
Improved symptoms with PRN hydroxyzine and control of panic symptoms. No alarm symptoms present today. Continue current treatment and monitor for new or worsening symptoms and report immediately. F/U in 6 months or sooner if needed.  ?

## 2021-08-28 ENCOUNTER — Encounter: Payer: Self-pay | Admitting: Physician Assistant

## 2021-08-28 ENCOUNTER — Ambulatory Visit: Payer: Medicaid Other | Admitting: Physician Assistant

## 2021-08-28 VITALS — BP 108/60 | HR 58 | Ht 60.0 in | Wt 153.0 lb

## 2021-08-28 DIAGNOSIS — R109 Unspecified abdominal pain: Secondary | ICD-10-CM

## 2021-08-28 NOTE — Patient Instructions (Signed)
REFERRAL: ? ?A referral, your demographics, a copy of your insurance card and your records will be sent to [facility]. You will receive a call from their office regarding the date, time and location of your appointment. ? ? ?If you are age 35 or older, your body mass index should be between 23-30. Your Body mass index is 29.88 kg/m?Marland Kitchen If this is out of the aforementioned range listed, please consider follow up with your Primary Care Provider. ? ?If you are age 31 or younger, your body mass index should be between 19-25. Your Body mass index is 29.88 kg/m?Marland Kitchen If this is out of the aformentioned range listed, please consider follow up with your Primary Care Provider.  ? ?________________________________________________________ ? ?The [facility] providers would like to encourage you to use [facility] to communicate with providers for non-urgent requests or questions.  Due to long hold times on the telephone, sending your provider a message by [facility] may be a faster and more efficient way to get a response.  Please allow 48 business hours for a response.  Please remember that this is for non-urgent requests.  ?_______________________________________________________ ? ?

## 2021-08-28 NOTE — Progress Notes (Signed)
? ?Chief Complaint: Abdominal pain ? ?HPI: ?   Lydia [name] is a 36 year old female with a past medical history as listed below, known to Dr. Hilarie Fredrickson, who presents to clinic today for her complaint of abdominal pain. ?   01/05/2019 patient saw Dr. Hilarie Fredrickson and described bloating and epigastric discomfort.  She was on Pantoprazole twice a day and it was not helping.  It was recommended she have an EGD.  She was also continued on Linzess 145 mcg 2 days on and off 1 day.  For chronic constipation. ?   03/12/2019 EGD with mild reflux esophagitis.  Pantoprazole stopped.  Prescribed Dexilant 60 mg daily.  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 ?     05/31/2019 patient was seen in clinic by me for follow-up of dyspepsia and epigastric pain.  At that time was doing well, no further epigastric discomfort.  Very occasionally she would have a little bit of stomach upset but only lasted for short time.  She was happy with her current medications including Dexilant 60 mg daily and amitriptyline 50 mg nightly as well as every other day Linzess.  She was continued on Linzess 145 mcg 1 tablet 2 days in a row then skip a day as well as Dexilant 60 mg daily and amitriptyline 50 mg nightly. ?   03/24/2020 colonoscopy Dr.Pyrtle with ileitis, rule out Crohn's disease, capacious colon, particularly in the left colon likely in part explain constipation (versus result of).  Otherwise normal.  Repeat recommended at 46.  Biopsies from her small bowel showed active inflammation but no chronic inflammation, this was nonspecific but did not appear to be classic for Crohn's disease.  It was thought the possibly NSAIDs caused this.  Recommend she avoid these. ?   08/01/2020 patient described chronic right upper quadrant pain.  Constipation is controlled on Motegrity.  At that time ordered labs including CBC, CMP and lipase also an abdominal ultrasound with focus on the right upper quadrant and prescribed Zofran as needed.   Discussed repeating endoscopy if nausea and pain continued for CT scan.  Did discuss this may be scar tissue which pulls at times. ?   08/09/2020 abdominal ultrasound hepatomegaly and otherwise normal. ?   09/18/2020 EGD was normal. ?   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  She was referred to [facility]. ?   01/23/2021 CT the abdomen pelvis with contrast showed probable small left ovarian cyst and resection of the mesenteric lesion. ?   Today, patient presents to clinic and tells me that she continues with her chronic right-sided pain.  Describes that it used to be once every other month and would go away if she laid on her side with a pillow there and took some ibuprofen, but now it just is not going away and it occurs about every other week.  Tells me that she does not notice it at any particular point of the day, but it does seem to get better if she changes position or applies pressure to the side.  Denies any strenuous activity or lifting on a regular basis.  Does describe the pain as a stabbing which seems to go from the front of her abdomen to the back and she does have to sit down when it occurs.  Associated symptoms occasionally include nausea from the pain. ?   Denies fever, chills, change in bowel  habits, weight loss, vomiting, heartburn or reflux. ? ?Past Medical History:  ?Diagnosis Date  ? Abdominal pain, epigastric 01/26/2014  ? Acute recurrent maxillary sinusitis 03/11/2014  ? Allergy   ? Anxiety   ? Chronic constipation   ? Motegrity helps   ? Chronic headaches   ? none x 2-3 yrs  ? GERD (gastroesophageal reflux disease)   ? HEADACHE 11/30/2007  ? Qualifier: Diagnosis of  By: Grier Rocher MD, Raphael Gibney    ? Ileitis   ? Infection   ? UTI  ? 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 Meningitis   ? Ovarian cyst   ? Pain 12/08/2013  ? Dyspareunia   ? Pelvic pain in female 07/06/2013  ? ? ?Past Surgical  History:  ?Procedure Laterality Date  ? CHOLECYSTECTOMY  2016  ? COLONOSCOPY    ? LAPAROSCOPIC APPENDECTOMY N/A 11/10/2020  ? Procedure: LAPAROSCOPIC APPENDECTOMY;  Surgeon: Kinsinger, Arta Bruce, MD;  Location: [facility];  Service: General;  Laterality: N/A;  ? UPPER GASTROINTESTINAL ENDOSCOPY  2015  ? WISDOM TOOTH EXTRACTION    ? ? ?Current Outpatient Medications  ?Medication Sig Dispense Refill  ? busPIRone (BUSPAR) 5 MG tablet Take 1 tablet (5 mg total) by mouth daily. May also take 1 tablet (5 mg total) 2 (two) times daily as needed. For anxiety. 90 tablet 3  ? cromolyn (OPTICROM) 4 % ophthalmic solution 1 drop 4 (four) times daily.    ? Galcanezumab-gnlm (EMGALITY) 120 MG/ML SOAJ Inject 1 pen. into the skin every 30 (thirty) days. 1.12 mL 3  ? hydrOXYzine (VISTARIL) 50 MG capsule Take 1 capsule (50 mg total) by mouth at bedtime and may repeat dose one time if needed. For sleep and anxiety. 60 capsule 6  ? MOTEGRITY 2 MG TABS TAKE 1 TABLET(2 MG) BY MOUTH DAILY 30 tablet 3  ? norgestrel-ethinyl estradiol (OGESTROL) 0.5-50 MG-MCG tablet Take 1 tablet by mouth daily. 84 tablet 3  ? ondansetron (ZOFRAN-ODT) 8 MG disintegrating tablet Take 1 tablet (8 mg total) by mouth every 8 (eight) hours as needed for nausea. 20 tablet 3  ? sertraline (ZOLOFT) 50 MG tablet Take 1.5 tablets (75 mg total) by mouth at bedtime. 45 tablet 3  ? topiramate (TOPAMAX) 100 MG tablet TAKE 1 TABLET(100 MG) BY MOUTH DAILY 30 tablet 3  ? Vitamin D, Ergocalciferol, (DRISDOL) 1.25 MG (50000 UNIT) CAPS capsule Take 1 capsule (50,000 Units total) by mouth every 7 (seven) days. Take for 12 total doses(weeks) 12 capsule 0  ? ?No current facility-administered medications for this visit.  ? ? ?Allergies as of 08/28/2021 - Review Complete 08/20/2021  ?Allergen Reaction Noted  ? Penicillins Hives, Itching, and Rash 11/30/2007  ? ? ?Family History  ?Problem Relation Age of Onset  ? Migraines Mother   ? Hypertension Mother   ? Asthma Father   ? Birth defects  Daughter   ?     Limb reduction defect  ? Breast cancer Maternal Aunt 42  ? Colon cancer Neg Hx   ? Esophageal cancer Neg Hx   ? Rectal cancer Neg Hx   ? Stomach cancer Neg Hx   ? Colon polyps Neg Hx   ? ? ?Social History  ? ?Socioeconomic History  ? Marital status: Married  ?  Spouse name: Not on file  ? Number of children: 3  ? Years of education: Not on file  ? Highest education level: 9th grade  ?Occupational History  ? Not on file  ?Tobacco Use  ? Smoking status: Never  ?  Smokeless tobacco: Never  ?Vaping Use  ? Vaping Use: Never used  ?Substance and Sexual Activity  ? Alcohol use: No  ?  Alcohol/week: 0.0 standard drinks  ? Drug use: No  ? Sexual activity: Yes  ?  Partners: Male  ?  Birth control/protection: Pill, OCP  ?Other Topics Concern  ? Not on file  ?Social History Narrative  ? Lives with family  ? Caffeine- none   ? ?Social Determinants of Health  ? ?Financial Resource Strain: Not on file  ?Food Insecurity: Not on file  ?Transportation Needs: Not on file  ?Physical Activity: Not on file  ?Stress: Not on file  ?Social Connections: Not on file  ?Intimate Partner Violence: Not on file  ? ? ?Review of Systems:    ?Constitutional: No weight loss, fever or chills ?Cardiovascular: No chest pain ?Respiratory: No SOB  ?Gastrointestinal: See HPI and otherwise negative ? ? Physical Exam:  ?Vital signs: ?BP 108/60   Pulse (!) 58   Ht 5' (1.524 m)   Wt 153 lb (69.4 kg)   BMI 29.88 kg/m?   ? ?Constitutional:   Pleasant female appears to be in NAD, Well developed, Well nourished, alert and cooperative ?Respiratory: Respirations even and unlabored. Lungs clear to auscultation bilaterally.   No wheezes, crackles, or rhonchi.  ?Cardiovascular: Normal S1, S2. No MRG. Regular rate and rhythm. No peripheral edema, cyanosis or pallor.  ?Gastrointestinal:  Soft, nondistended, mild TTP on the right side of the abdomen just under the ribs/question on the ribs, no rebound or guarding. Normal bowel sounds. No appreciable  masses or hepatomegaly. ?Rectal:  Not performed.  ?Msk:  Symmetrical without gross deformities. Without edema, no deformity or joint abnormality.  ?Psychiatric: Oriented to person, place and time. Demonstrates

## 2021-08-31 NOTE — Progress Notes (Signed)
Addendum: Reviewed and agree with assessment and management plan. Enola Siebers M, MD  

## 2021-09-03 ENCOUNTER — Encounter: Payer: Self-pay | Admitting: Obstetrics

## 2021-09-04 ENCOUNTER — Encounter: Payer: Self-pay | Admitting: Psychiatry

## 2021-09-04 DIAGNOSIS — R519 Headache, unspecified: Secondary | ICD-10-CM

## 2021-09-04 MED ORDER — TOPIRAMATE 100 MG PO TABS: 200.0000 mg | ORAL_TABLET | Freq: Every day | ORAL | 3 refills | Status: DC

## 2021-09-04 NOTE — Telephone Encounter (Signed)
Chart reviewed, patient has constant daily headache symptoms age 36 following a virus meningitis, ? 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 ?From previous documentation, she did respond to Topamax, Dr. Billey Gosling was planning on higher dose of Topamax if patient continued to have headache ? ?New prescription was sent in, Topamax 100 mg 2 tablets every night, further decision to be made after Dr.Chima return to clinic ? ?Meds ordered this encounter  ?Medications  ? topiramate (TOPAMAX) 100 MG tablet  ?  Sig: Take 2 tablets (200 mg total) by mouth at bedtime.  ?  Dispense:  60 tablet  ?  Refill:  3  ?   ?

## 2021-09-08 ENCOUNTER — Other Ambulatory Visit: Payer: Self-pay | Admitting: Internal Medicine

## 2021-09-10 ENCOUNTER — Other Ambulatory Visit (HOSPITAL_BASED_OUTPATIENT_CLINIC_OR_DEPARTMENT_OTHER): Payer: Self-pay | Admitting: Nurse Practitioner

## 2021-09-10 ENCOUNTER — Ambulatory Visit (HOSPITAL_BASED_OUTPATIENT_CLINIC_OR_DEPARTMENT_OTHER): Payer: Medicaid Other

## 2021-09-11 ENCOUNTER — Encounter (HOSPITAL_BASED_OUTPATIENT_CLINIC_OR_DEPARTMENT_OTHER): Payer: Self-pay | Admitting: Nurse Practitioner

## 2021-09-11 LAB — VITAMIN D 25 HYDROXY (VIT D DEFICIENCY, FRACTURES): Vit D, 25-Hydroxy: 45 ng/mL (ref 30.0–100.0)

## 2021-09-12 ENCOUNTER — Ambulatory Visit (HOSPITAL_BASED_OUTPATIENT_CLINIC_OR_DEPARTMENT_OTHER): Payer: Medicaid Other

## 2021-09-12 ENCOUNTER — Encounter: Payer: Self-pay | Admitting: Obstetrics

## 2021-09-12 ENCOUNTER — Ambulatory Visit (INDEPENDENT_AMBULATORY_CARE_PROVIDER_SITE_OTHER): Payer: Medicaid Other | Admitting: Obstetrics

## 2021-09-12 VITALS — BP 108/70 | HR 69 | Ht 60.0 in | Wt 152.9 lb

## 2021-09-12 DIAGNOSIS — N9412 Deep dyspareunia: Secondary | ICD-10-CM | POA: Diagnosis not present

## 2021-09-12 MED ORDER — IBUPROFEN 800 MG PO TABS: 800.0000 mg | ORAL_TABLET | Freq: Three times a day (TID) | ORAL | 5 refills | Status: DC | PRN

## 2021-09-12 NOTE — Progress Notes (Signed)
Patient presents for having pain with intercourse. Sx has been presents off and on for some years, but has gotten worse within the last 3 weeks. Denies having any vaginal discharge, odor, or irritation. Declines STD testing and screening for yeast or bv. ? ?Last pap 07/10/21 Normal ?

## 2021-09-12 NOTE — Progress Notes (Signed)
Patient ID: Lydia [name], female   DOB: 03-19-1986, 36 y.o.   MRN: [id number] ? ?Chief Complaint  ?Patient presents with  ? Gynecologic Exam  ? ? ?HPI ?Lydia [name] is a 36 y.o. female.  Complains of LLQ pain over the past 3 weeks.  Denies abnormal vaginal discharge, vaginal bleeding or dysuria. ?HPI ? ?Past Medical History:  ?Diagnosis Date  ? Abdominal pain, epigastric 01/26/2014  ? Acute recurrent maxillary sinusitis 03/11/2014  ? Allergy   ? Anxiety   ? Chronic constipation   ? Motegrity helps   ? Chronic headaches   ? none x 2-3 yrs  ? GERD (gastroesophageal reflux disease)   ? HEADACHE 11/30/2007  ? Qualifier: Diagnosis of  By: Grier Rocher MD, Raphael Gibney    ? Ileitis   ? Infection   ? UTI  ? 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 Meningitis   ? Ovarian cyst   ? Pain 12/08/2013  ? Dyspareunia   ? Pelvic pain in female 07/06/2013  ? ? ?Past Surgical History:  ?Procedure Laterality Date  ? CHOLECYSTECTOMY  2016  ? COLONOSCOPY    ? LAPAROSCOPIC APPENDECTOMY N/A 11/10/2020  ? Procedure: LAPAROSCOPIC APPENDECTOMY;  Surgeon: Kinsinger, Arta Bruce, MD;  Location: [facility];  Service: General;  Laterality: N/A;  ? UPPER GASTROINTESTINAL ENDOSCOPY  2015  ? WISDOM TOOTH EXTRACTION    ? ? ?Family History  ?Problem Relation Age of Onset  ? Migraines Mother   ? Hypertension Mother   ? Asthma Father   ? Birth defects Daughter   ?     Limb reduction defect  ? Breast cancer Maternal Aunt 42  ? Colon cancer Neg Hx   ? Esophageal cancer Neg Hx   ? Rectal cancer Neg Hx   ? Stomach cancer Neg Hx   ? Colon polyps Neg Hx   ? ? ?Social History ?Social History  ? ?Tobacco Use  ? Smoking status: Never  ? Smokeless tobacco: Never  ?Vaping Use  ? Vaping Use: Never used  ?Substance Use Topics  ? Alcohol use: Yes  ?  Comment: occ  ? Drug use: No  ? ? ?Allergies  ?Allergen Reactions  ? Penicillins Hives, Itching and Rash  ?  Has patient had a PCN reaction causing immediate rash, facial/tongue/throat swelling, SOB or lightheadedness with hypotension: Yes ?Has  patient had a PCN reaction causing severe rash involving mucus membranes or skin necrosis: Yes ?Has patient had a PCN reaction that required hospitalization no ?Has patient had a PCN reaction occurring within the last 10 years: No ?If all of the above answers are "NO", then may proceed with Cephalosporin use. ?Has patient had a PCN reaction causing immediate rash, facial/tongue/throat swelling, SOB or lightheadedness with hypotension: Yes ?Has patient had a PCN reaction causing severe rash involving mucus membranes or skin necrosis: Yes ?Has patient had a PCN reaction that required hospitalization no ?Has patient had a PCN reaction occurring within the last 10 years: No ?If all of the above answers are "NO", then may proceed with Cephalosporin use.  ? ? ?Current Outpatient Medications  ?Medication Sig Dispense Refill  ? busPIRone (BUSPAR) 5 MG tablet Take 1 tablet (5 mg total) by mouth daily. May also take 1 tablet (5 mg total) 2 (two) times daily as needed. For anxiety. 90 tablet 3  ? cromolyn (OPTICROM) 4 % ophthalmic solution 1 drop 4 (four) times daily.    ? Galcanezumab-gnlm (EMGALITY) 120 MG/ML SOAJ Inject 1 pen. into the skin every 30 (  thirty) days. 1.12 mL 3  ? hydrOXYzine (VISTARIL) 50 MG capsule Take 1 capsule (50 mg total) by mouth at bedtime and may repeat dose one time if needed. For sleep and anxiety. 60 capsule 6  ? ibuprofen (ADVIL) 800 MG tablet Take 1 tablet (800 mg total) by mouth every 8 (eight) hours as needed. 30 tablet 5  ? JUNEL FE 1.5/30 1.5-30 MG-MCG tablet Take 1 tablet by mouth daily.    ? MOTEGRITY 2 MG TABS TAKE 1 TABLET(2 MG) BY MOUTH DAILY 30 tablet 3  ? ondansetron (ZOFRAN-ODT) 8 MG disintegrating tablet Take 1 tablet (8 mg total) by mouth every 8 (eight) hours as needed for nausea. 20 tablet 3  ? sertraline (ZOLOFT) 50 MG tablet Take 1.5 tablets (75 mg total) by mouth at bedtime. 45 tablet 3  ? topiramate (TOPAMAX) 100 MG tablet Take 2 tablets (200 mg total) by mouth at bedtime. 60  tablet 3  ? ?No current facility-administered medications for this visit.  ? ? ?Review of Systems ?Review of Systems ?Constitutional: negative for fatigue and weight loss ?Respiratory: negative for cough and wheezing ?Cardiovascular: negative for chest pain, fatigue and palpitations ?Gastrointestinal: negative for abdominal pain and change in bowel habits ?Genitourinary:positive for pelvic pain with intercourse ?Integument/breast: negative for nipple discharge ?Musculoskeletal:negative for myalgias ?Neurological: negative for gait problems and tremors ?Behavioral/Psych: negative for abusive relationship, depression ?Endocrine: negative for temperature intolerance    ?  ?Blood pressure 108/70, pulse 69, height 5' (1.524 m), weight 152 lb 14.4 oz (69.4 kg), last menstrual period 08/13/2021. ? ?Physical Exam ?Physical Exam ?General:   Alert and no distress  ?Skin:   no rash or abnormalities  ?Lungs:   clear to auscultation bilaterally  ?Heart:   regular rate and rhythm, S1, S2 normal, no murmur, click, rub or gallop  ?Breasts:   normal without suspicious masses, skin or nipple changes or axillary nodes  ?Abdomen:  normal findings: no organomegaly, soft, non-tender and no hernia  ?Pelvis:  External genitalia: normal general appearance ?Urinary system: urethral meatus normal and bladder without fullness, nontender ?Vaginal: normal without tenderness, induration or masses ?Cervix: normal appearance ?Adnexa: normal bimanual exam ?Uterus: anteverted and non-tender, normal size  ?  ?I have spent a total of 20 minutes of face-to-face time, excluding clinical staff time, reviewing notes and preparing to see patient, ordering tests and/or medications, and counseling the patient.  ? ?Data Reviewed ?CT of Pelvis ? ?Assessment  ?   ?1. Deep dyspareunia ?Rx: ?- US PELVIC COMPLETE WITH TRANSVAGINAL; Future ?- ibuprofen (ADVIL) 800 MG tablet; Take 1 tablet (800 mg total) by mouth every 8 (eight) hours as needed.  Dispense: 30 tablet;  Refill: 5  ?  ? ?Plan ?  Follow up in 2 weeks ? ?Orders Placed This Encounter  ?Procedures  ? US PELVIC COMPLETE WITH TRANSVAGINAL  ?  Standing Status:   Future  ?  Standing Expiration Date:   09/13/2022  ?  Order Specific Question:   Reason for Exam (SYMPTOM  OR DIAGNOSIS REQUIRED)  ?  Answer:   Pelvic pain with intercourse.  ?  Order Specific Question:   Preferred imaging location?  ?  Answer:   [facility]  ? ?Meds ordered this encounter  ?Medications  ? ibuprofen (ADVIL) 800 MG tablet  ?  Sig: Take 1 tablet (800 mg total) by mouth every 8 (eight) hours as needed.  ?  Dispense:  30 tablet  ?  Refill:  5  ? ?  ?  Shelly Bombard, MD ?09/12/2021 10:20 AM  ?

## 2021-09-13 ENCOUNTER — Ambulatory Visit (HOSPITAL_BASED_OUTPATIENT_CLINIC_OR_DEPARTMENT_OTHER)
Admission: RE | Admit: 2021-09-13 | Discharge: 2021-09-13 | Disposition: A | Discharge status: Home / Self Care | Payer: Medicaid Other | Source: Ambulatory Visit | Attending: Obstetrics | Admitting: Obstetrics

## 2021-09-13 DIAGNOSIS — N9412 Deep dyspareunia: Secondary | ICD-10-CM | POA: Diagnosis not present

## 2021-09-14 ENCOUNTER — Encounter (HOSPITAL_BASED_OUTPATIENT_CLINIC_OR_DEPARTMENT_OTHER): Payer: Self-pay | Admitting: Nurse Practitioner

## 2021-09-14 ENCOUNTER — Ambulatory Visit (INDEPENDENT_AMBULATORY_CARE_PROVIDER_SITE_OTHER): Payer: Medicaid Other | Admitting: Nurse Practitioner

## 2021-09-14 DIAGNOSIS — R519 Headache, unspecified: Secondary | ICD-10-CM

## 2021-09-14 DIAGNOSIS — E559 Vitamin D deficiency, unspecified: Secondary | ICD-10-CM | POA: Diagnosis not present

## 2021-09-14 DIAGNOSIS — R202 Paresthesia of skin: Secondary | ICD-10-CM | POA: Diagnosis not present

## 2021-09-14 DIAGNOSIS — G43711 Chronic migraine without aura, intractable, with status migrainosus: Secondary | ICD-10-CM | POA: Diagnosis not present

## 2021-09-14 MED ORDER — SUMATRIPTAN SUCCINATE 50 MG PO TABS: ORAL_TABLET | ORAL | 11 refills | Status: DC

## 2021-09-14 MED ORDER — VITAMIN D3 50 MCG (2000 UT) PO CAPS: 2000.0000 [IU] | ORAL_CAPSULE | Freq: Every day | ORAL | 11 refills | Status: DC

## 2021-09-14 MED ORDER — POTASSIUM CHLORIDE CRYS ER 10 MEQ PO TBCR: 10.0000 meq | EXTENDED_RELEASE_TABLET | Freq: Every day | ORAL | 11 refills | Status: DC

## 2021-09-14 NOTE — Assessment & Plan Note (Signed)
Chronic daily headache with migraines not well controlled. At this time she is currently on Emgality and topiramate for prophyllaxis, which have not been effective thus far. She has just recently increased her topiramate dosing so it may take additional time.  ?I am concerned that the side effects of the topiramate may limit our option for this treatment, specifically if we are unable to control this with supplemental treatment. We will continue to monitor to see if this improves with time and if SE are controlled.  ?At this time will also send abortive treatment with sumatriptan in the event she does experience a breakthrough migraine headache.  ?I would like her to discuss with neurology other options that may be helpful for management of her condition.  ?Will continue to collaborate with neurology.  ?

## 2021-09-14 NOTE — Patient Instructions (Signed)
I have sent in vitamin D3 and potassium that you can take daily to see if this is helpful to lessen the numbness and tingling you are experiencing.  I do believe that the symptoms are coming from your topiramate.  I am concerned that the increased dose of topiramate may make the symptoms worse but we can certainly try to relieve these with the supplements.  We will need to make sure we keep a close eye on your vitamin D levels and potassium levels to make sure that they are not getting too high while you are on the supplements.  We can plan to recheck these labs in about 3 months as long as you are still on the medication. ? ?I have sent in a medication called sumatriptan (Imitrex) to help with your breakthrough migraine headaches.  You can take this medication at the first sign of a migraine and the dose may be repeated 1 time in about 2 hours if you do not have resolution of your migraine headache symptoms.  Be sure you do not take more than 2 doses in a 24-hour period.  ? ?If you are not having any improvement with your migraine headaches with the increased dose of topiramate it would be beneficial to speak with neurology to see if there are other options that may have less side effects for you.  It may be that your insurance would be willing to cover a different medication now that it is shown that you have not had relief with the Emgality.  Occasionally insurance companies require you to fail multiple treatments before they will approve others. ? ?Let me know if you have any questions or concerns or if your symptoms do not improve within the next few weeks. ?

## 2021-09-14 NOTE — Assessment & Plan Note (Signed)
Paresthesia, likely related to topiramate dosing. Recent vitamin D levels were WNL. Interestingly, her symptoms resolved while on the vitamin D high dose replacement. Given these findings, we will plan to restart daily vitamin D supplement at 2000 iU daily to see if we can improve control of her paresthesias. Will also add potassium 47mq at bedtime to see if this helps reduce the incidences. Recent potassium and Vitamin D labs reviewed. Will need to monitor potassium and VitD levels closely to ensure that they remain WNL while on therapy. I am concerned that the increased dose in topiramate may lead to worsening paresthesias and this medication may have to be stopped.  ?We will monitor closely.  ?

## 2021-09-14 NOTE — Progress Notes (Signed)
Virtual Visit Encounter telephone visit. ? ? ?I connected with  Lydia [name] on 09/14/21 at  8:50 AM EDT by secure audio telemedicine application. I verified that I am speaking with the correct person using two identifiers. ?  ?I introduced myself as a Designer, jewellery with the practice. The limitations of evaluation and management by telemedicine discussed with the patient and the availability of in person appointments. The patient expressed verbal understanding and consent to proceed. ? ?Participating parties in this visit include: Myself and patient ? ?The patient is: Patient Location: Home ?I am: Provider Location: Office/Clinic ?Subjective:   ? ?CC and HPI: Lydia [name] is a 36 y.o. year old female presenting for follow up of lab results and discuss symptoms.  ?Alysiah tells me that she has completed her Vitamin D high dose.  ?She reports that as soon as she stopped the vitamin D supplement the tingling in her hands, feet, and mouth came back immediately.  ?She is still having daily headaches and severe migraines. She started emgality about 2 months ago and the first month she was headache free. 3-4 days after the second injection she reports she experienced a migraine headache that lasted for 2 days. She reports that her neurologist told her to increase her topiramate to '200mg'$  a day.  ?This did not coincide with the return of the paresthesia's, this was already present.  ? ?She does not have any abortive migraine treatment at this time.  ? ?Past medical history, Surgical history, Family history not pertinant except as noted below, Social history, Allergies, and medications have been entered into the medical record, reviewed, and corrections made.  ? ?Review of Systems:  ?All review of systems negative except what is listed in the HPI ? ?Objective:   ? ?Alert and oriented x 4 ?Speaking in clear sentences with no shortness of breath. ?No distress. ? ?Impression and Recommendations:   ? ?Problem List Items  Addressed This Visit   ? ? Chronic daily headache  ?  Chronic daily headache with migraines not well controlled. At this time she is currently on Emgality and topiramate for prophyllaxis, which have not been effective thus far. She has just recently increased her topiramate dosing so it may take additional time.  ?I am concerned that the side effects of the topiramate may limit our option for this treatment, specifically if we are unable to control this with supplemental treatment. We will continue to monitor to see if this improves with time and if SE are controlled.  ?At this time will also send abortive treatment with sumatriptan in the event she does experience a breakthrough migraine headache.  ?I would like her to discuss with neurology other options that may be helpful for management of her condition.  ?Will continue to collaborate with neurology.  ? ?  ?  ? Relevant Medications  ? SUMAtriptan (IMITREX) 50 MG tablet  ? Paresthesia  ?  Paresthesia, likely related to topiramate dosing. Recent vitamin D levels were WNL. Interestingly, her symptoms resolved while on the vitamin D high dose replacement. Given these findings, we will plan to restart daily vitamin D supplement at 2000 iU daily to see if we can improve control of her paresthesias. Will also add potassium 90mq at bedtime to see if this helps reduce the incidences. Recent potassium and Vitamin D labs reviewed. Will need to monitor potassium and VitD levels closely to ensure that they remain WNL while on therapy. I am concerned that the increased dose in  topiramate may lead to worsening paresthesias and this medication may have to be stopped.  ?We will monitor closely.  ? ?  ?  ? Relevant Medications  ? Cholecalciferol (VITAMIN D3) 50 MCG (2000 UT) capsule  ? potassium chloride (KLOR-CON M) 10 MEQ tablet  ? ?Other Visit Diagnoses   ? ? Vitamin D deficiency    -  Primary  ? Intractable chronic migraine without aura and with status migrainosus      ?  Relevant Medications  ? SUMAtriptan (IMITREX) 50 MG tablet  ? ?  ? ? ?orders and follow up as documented in EMR ?I discussed the assessment and treatment plan with the patient. The patient was provided an opportunity to ask questions and all were answered. The patient agreed with the plan and demonstrated an understanding of the instructions. ?  ?The patient was advised to call back or seek an in-person evaluation if the symptoms worsen or if the condition fails to improve as anticipated. ? ?Follow-Up: prn ? ?I provided 20 minutes of non-face-to-face interaction with this non face-to-face encounter including intake, same-day documentation, and chart review.  ? ?[name], NP , DNP, AGNP-c ?[facility] ?Primary Care & Sports Medicine at [facility] ?[phone] ?[phone] (fax) ? ?

## 2021-09-17 ENCOUNTER — Other Ambulatory Visit (HOSPITAL_BASED_OUTPATIENT_CLINIC_OR_DEPARTMENT_OTHER): Payer: Self-pay

## 2021-09-17 ENCOUNTER — Other Ambulatory Visit: Payer: Self-pay | Admitting: Psychiatry

## 2021-09-17 ENCOUNTER — Encounter (INDEPENDENT_AMBULATORY_CARE_PROVIDER_SITE_OTHER): Payer: Medicaid Other | Admitting: Psychiatry

## 2021-09-17 ENCOUNTER — Encounter (HOSPITAL_BASED_OUTPATIENT_CLINIC_OR_DEPARTMENT_OTHER): Payer: Self-pay | Admitting: Nurse Practitioner

## 2021-09-17 DIAGNOSIS — G43719 Chronic migraine without aura, intractable, without status migrainosus: Secondary | ICD-10-CM

## 2021-09-17 DIAGNOSIS — R202 Paresthesia of skin: Secondary | ICD-10-CM

## 2021-09-17 MED ORDER — VITAMIN D3 50 MCG (2000 UT) PO CAPS: 2000.0000 [IU] | ORAL_CAPSULE | Freq: Every day | ORAL | 11 refills | Status: DC

## 2021-09-17 MED ORDER — METHYLPREDNISOLONE 4 MG PO TBPK: ORAL_TABLET | ORAL | 0 refills | Status: DC

## 2021-09-17 NOTE — Progress Notes (Signed)

## 2021-09-26 ENCOUNTER — Encounter: Payer: Self-pay | Admitting: Obstetrics

## 2021-09-26 ENCOUNTER — Telehealth (INDEPENDENT_AMBULATORY_CARE_PROVIDER_SITE_OTHER): Payer: Medicaid Other | Admitting: Obstetrics

## 2021-09-26 DIAGNOSIS — N9412 Deep dyspareunia: Secondary | ICD-10-CM | POA: Diagnosis not present

## 2021-09-26 DIAGNOSIS — Z8719 Personal history of other diseases of the digestive system: Secondary | ICD-10-CM | POA: Diagnosis not present

## 2021-09-26 NOTE — Progress Notes (Signed)
? ? ?GYNECOLOGY VIRTUAL VISIT ENCOUNTER NOTE ? ?Provider location: [facility] for [facility] [place]  ? ?Patient location: Home ? ?I connected with Lydia [name] on 09/26/21 at 11:15 AM EDT by MyChart Video Encounter and verified that I am speaking with the correct person using two identifiers. ?  ?I discussed the limitations, risks, security and privacy concerns of performing an evaluation and management service virtually and the availability of in person appointments. I also discussed with the patient that there may be a patient responsible charge related to this service. The patient expressed understanding and agreed to proceed. ?  ?History:  ?Lydia [name] is a 36 y.o. [phone] female being evaluated today for ultrasound results and management recommendations.  She has a history of deep dyspareunia.  She denies any abnormal vaginal discharge, bleeding, or other concerns.  She does have a history of chronic constipation. ?  ?  ?Past Medical History:  ?Diagnosis Date  ? Abdominal pain, epigastric 01/26/2014  ? Acute recurrent maxillary sinusitis 03/11/2014  ? Allergy   ? Anxiety   ? Chronic constipation   ? Motegrity helps   ? Chronic headaches   ? none x 2-3 yrs  ? GERD (gastroesophageal reflux disease)   ? HEADACHE 11/30/2007  ? Qualifier: Diagnosis of  By: Grier Rocher MD, Raphael Gibney    ? Ileitis   ? Infection   ? UTI  ? 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 Meningitis   ? Ovarian cyst   ? Pain 12/08/2013  ? Dyspareunia   ? Pelvic pain in female 07/06/2013  ? ?Past Surgical History:  ?Procedure Laterality Date  ? CHOLECYSTECTOMY  2016  ? COLONOSCOPY    ? LAPAROSCOPIC APPENDECTOMY N/A 11/10/2020  ? Procedure: LAPAROSCOPIC APPENDECTOMY;  Surgeon: Kinsinger, Arta Bruce, MD;  Location: [facility];  Service: General;  Laterality: N/A;  ? UPPER GASTROINTESTINAL ENDOSCOPY  2015  ? WISDOM TOOTH EXTRACTION    ? ?The following portions of the patient's history were reviewed and updated as appropriate: allergies, current medications, past  family history, past medical history, past social history, past surgical history and problem list.  ? ?Health Maintenance:  Normal pap and negative HRHPV on 07-10-2021.    ? ?Review of Systems:  ?Pertinent items noted in HPI and remainder of comprehensive ROS otherwise negative. ? ?Physical Exam:  ? ?General:  Alert, oriented and cooperative. Patient appears to be in no acute distress.  ?Mental Status: Normal mood and affect. Normal behavior. Normal judgment and thought content.   ?Respiratory: Normal respiratory effort, no problems with respiration noted  ?Rest of physical exam deferred due to type of encounter ? ?Labs and Imaging ?No results found for this or any previous visit (from the past 336 hour(s)). ?US PELVIC COMPLETE WITH TRANSVAGINAL ? ?Result Date: 09/13/2021 ?CLINICAL DATA:  Pelvic pain with intercourse, LEFT greater than RIGHT. Symptoms for 3 weeks. LMP 08/14/2021 EXAM: TRANSABDOMINAL AND TRANSVAGINAL ULTRASOUND OF PELVIS TECHNIQUE: Both transabdominal and transvaginal ultrasound examinations of the pelvis were performed. Transabdominal technique was performed for global imaging of the pelvis including uterus, ovaries, adnexal regions, and pelvic cul-de-sac. It was necessary to proceed with endovaginal exam following the transabdominal exam to visualize the endometrium and ovaries. COMPARISON:  None Available. FINDINGS: Uterus Measurements: 8.2 x 4.2 x 4.4 centimeters = volume: 78.8 mL. Uterus is anteverted and mildly heterogeneous. Small posterior fibroid is 0.6 x 0.7 x 0.6 centimeters. Endometrium Thickness: 7.3.  No focal abnormality visualized. Right ovary Measurements: 1.5 x 1.81.3 centimeters = volume: 1.7  mL. Normal appearance/no adnexal mass. Left ovary Measurements: 2.6 x 1.6 x 1.6 centimeters = volume: 3.7 mL. Normal appearance/no adnexal mass. Other findings Trace free pelvic fluid is likely physiologic. IMPRESSION: Normal pelvic ultrasound. Electronically Signed   By: Nolon Nations M.D.    On: 09/13/2021 13:12     ?  ?Assessment and Plan:  ?   ?1. Deep dyspareunia ?- normal pelvic ultrasound ? ?2. History of IBS, constipation ?- continue meds for IBS ? ?Follow up in 3 months.  May need pelvic floor PT.   ?   ?  ?I discussed the assessment and treatment plan with the patient. The patient was provided an opportunity to ask questions and all were answered. The patient agreed with the plan and demonstrated an understanding of the instructions. ?  ?The patient was advised to call back or seek an in-person evaluation/go to the ED if the symptoms worsen or if the condition fails to improve as anticipated. ? ?I have spent a total of 15 minutes of non-face-to-face time, excluding clinical staff time, reviewing notes and preparing to see patient, ordering tests and/or medications, and counseling the patient.  ? ? ?Baltazar Najjar, MD ?[facility] [place], [place] ?09/26/21  ?

## 2021-09-26 NOTE — Progress Notes (Signed)
Patient presents for Mychart follow up for deep dyspareunia. Patient identified with two patient identifiers. No other concerns. ?

## 2021-09-27 NOTE — Progress Notes (Signed)
Zach Leiland Mihelich Minf[place] [address] [place] Phone: [phone] Subjective:   IVilma Meckel, am serving as a scribe for Dr. Hulan Saas. This visit occurred during the SARS-CoV-2 public health emergency.  Safety protocols were in place, including screening questions prior to the visit, additional usage of staff PPE, and extensive cleaning of exam room while observing appropriate contact time as indicated for disinfecting solutions.   I'm seeing this patient by the request  of:  Early, Coralee Pesa, NP  CC: Back pain and abdominal pain.  [id number]  Elinda A Thien is a 36 y.o. female coming in with complaint of LRQ pain. Lateral side near ribs. Has been happening for over a year. Comes and goes. Pain last longer when happening. Sharp pain. Sometimes radiates to sternum. Ibuprofen and rest help. Pain cannot be produced.   Reviewed patient's chart.  Patient was having abdominal pain and was found to have a leiomyoma after pathological results from an appendectomy.  Been following with primary care provider, gastroenterology, as well as OB/GYN and further work-up including ultrasound of the pelvis this month was unremarkable.  Patient is also CT scan in September 2022 showing no recurrence of the mass that was noted on the June 2022.    Past Medical History:  Diagnosis Date   Abdominal pain, epigastric 01/26/2014   Acute recurrent maxillary sinusitis 03/11/2014   Allergy    Anxiety    Chronic constipation    Motegrity helps    Chronic headaches    none x 2-3 yrs   GERD (gastroesophageal reflux disease)    HEADACHE 11/30/2007   Qualifier: Diagnosis of  By: Grier Rocher MD, Madaline Guthrie    Infection    UTI   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/22/2020   Meningitis    Ovarian cyst    Pain 12/08/2013   Dyspareunia    Pelvic pain in female 07/06/2013   Past Surgical History:  Procedure Laterality Date   CHOLECYSTECTOMY  2016   COLONOSCOPY     LAPAROSCOPIC  APPENDECTOMY N/A 11/10/2020   Procedure: LAPAROSCOPIC APPENDECTOMY;  Surgeon: Mickeal Skinner, MD;  Location: [facility];  Service: General;  Laterality: N/A;   UPPER GASTROINTESTINAL ENDOSCOPY  2015   WISDOM TOOTH EXTRACTION     Social History   Socioeconomic History   Marital status: Married    Spouse name: Not on file   Number of children: 3   Years of education: Not on file   Highest education level: 9th grade  Occupational History   Not on file  Tobacco Use   Smoking status: Never   Smokeless tobacco: Never  Vaping Use   Vaping Use: Never used  Substance and Sexual Activity   Alcohol use: Yes    Comment: occ   Drug use: No   Sexual activity: Yes    Partners: Male    Birth control/protection: Pill, OCP  Other Topics Concern   Not on file  Social History Narrative   Lives with family   Caffeine- none    Social Determinants of Health   Financial Resource Strain: Not on file  Food Insecurity: Not on file  Transportation Needs: Not on file  Physical Activity: Not on file  Stress: Not on file  Social Connections: Not on file   Allergies  Allergen Reactions   Penicillins Hives, Itching and Rash    Has patient had a PCN reaction causing immediate rash, facial/tongue/throat swelling, SOB or lightheadedness with hypotension:  Yes Has patient had a PCN reaction causing severe rash involving mucus membranes or skin necrosis: Yes Has patient had a PCN reaction that required hospitalization no Has patient had a PCN reaction occurring within the last 10 years: No If all of the above answers are "NO", then may proceed with Cephalosporin use. Has patient had a PCN reaction causing immediate rash, facial/tongue/throat swelling, SOB or lightheadedness with hypotension: Yes Has patient had a PCN reaction causing severe rash involving mucus membranes or skin necrosis: Yes Has patient had a PCN reaction that required hospitalization no Has patient had a PCN reaction occurring  within the last 10 years: No If all of the above answers are "NO", then may proceed with Cephalosporin use.   Family History  Problem Relation Age of Onset   Migraines Mother    Hypertension Mother    Asthma Father    Birth defects Daughter        Limb reduction defect   Breast cancer Maternal Aunt 36   Colon cancer Neg Hx    Esophageal cancer Neg Hx    Rectal cancer Neg Hx    Stomach cancer Neg Hx    Colon polyps Neg Hx     Current Outpatient Medications (Endocrine & Metabolic):    JUNEL FE 7.0/96 1.5-30 MG-MCG tablet, Take 1 tablet by mouth daily.   methylPREDNISolone (MEDROL DOSEPAK) 4 MG TBPK tablet, Take as directed by packaging    Current Outpatient Medications (Analgesics):    Galcanezumab-gnlm (EMGALITY) 120 MG/ML SOAJ, Inject 1 pen. into the skin every 30 (thirty) days.   ibuprofen (ADVIL) 800 MG tablet, Take 1 tablet (800 mg total) by mouth every 8 (eight) hours as needed.   SUMAtriptan (IMITREX) 50 MG tablet, Take 1 tab by mouth at the first sign of a migraine headache. You may repeat the dose 1 time if the headache is not resolved in 2 hours. Do not take more than 2 doses in 24 hours.   Current Outpatient Medications (Other):    busPIRone (BUSPAR) 5 MG tablet, Take 1 tablet (5 mg total) by mouth daily. May also take 1 tablet (5 mg total) 2 (two) times daily as needed. For anxiety.   Cholecalciferol (VITAMIN D3) 50 MCG (2000 UT) capsule, Take 1 capsule (2,000 Units total) by mouth daily.   cromolyn (OPTICROM) 4 % ophthalmic solution, 1 drop 4 (four) times daily.   hydrOXYzine (VISTARIL) 50 MG capsule, Take 1 capsule (50 mg total) by mouth at bedtime and may repeat dose one time if needed. For sleep and anxiety.   MOTEGRITY 2 MG TABS, TAKE 1 TABLET(2 MG) BY MOUTH DAILY   ondansetron (ZOFRAN-ODT) 8 MG disintegrating tablet, Take 1 tablet (8 mg total) by mouth every 8 (eight) hours as needed for nausea.   potassium chloride (KLOR-CON M) 10 MEQ tablet, Take 1 tablet (10  mEq total) by mouth at bedtime.   sertraline (ZOLOFT) 50 MG tablet, Take 1.5 tablets (75 mg total) by mouth at bedtime.   topiramate (TOPAMAX) 100 MG tablet, Take 2 tablets (200 mg total) by mouth at bedtime.   Reviewed prior external information including notes and imaging from  primary care provider As well as notes that were available from care everywhere and other healthcare systems.  Past medical history, social, surgical and family history all reviewed in electronic medical record.  No pertanent information unless stated regarding to the chief complaint.   Review of Systems:  No headache, visual changes, nausea, vomiting, diarrhea,  dizziness skin  rash, fevers, chills, night sweats, weight loss, swollen lymph nodes, body aches, joint swelling, chest pain, shortness of breath, mood changes. POSITIVE muscle aches, abdominal pain, constipation  Objective  Blood pressure 110/80, pulse 74, height 5' (1.524 m), weight 149 lb (67.6 kg), last menstrual period 09/21/2021, SpO2 98 %.   General: No apparent distress alert and oriented x3 mood and affect normal, dressed appropriately.  HEENT: Pupils equal, extraocular movements intact  Respiratory: Patient's speak in full sentences and does not appear short of breath  Cardiovascular: No lower extremity edema, non tender, no erythema  Gait normal with good balance and coordination.  MSK: Low back Does have significant tightness of the hip flexor right greater than left.  Patient does have tenderness to palpation going from the midline of the back to the right side.  Patient does have also tenderness over the lateral joint line over the rib cage and just inferior to it.  No masses appreciated.  Diffuse fullness of the abdomen noted.  Patient does have diffuse tenderness to palpation even to light palpation in the abdominal area.  Osteopathic findings  C2 flexed rotated and side bent right C7 flexed rotated and side bent left T3 extended rotated  and side bent right inhaled third rib T6 extended rotated and side bent left L2 flexed rotated and side bent right Sacrum right on right    Impression and Recommendations:    The above documentation has been reviewed and is accurate and complete Lyndal Pulley, DO

## 2021-09-28 ENCOUNTER — Ambulatory Visit (INDEPENDENT_AMBULATORY_CARE_PROVIDER_SITE_OTHER): Payer: Medicaid Other | Admitting: Family Medicine

## 2021-09-28 ENCOUNTER — Ambulatory Visit (INDEPENDENT_AMBULATORY_CARE_PROVIDER_SITE_OTHER): Payer: Medicaid Other

## 2021-09-28 VITALS — BP 110/80 | HR 74 | Ht 60.0 in | Wt 149.0 lb

## 2021-09-28 DIAGNOSIS — M9903 Segmental and somatic dysfunction of lumbar region: Secondary | ICD-10-CM

## 2021-09-28 DIAGNOSIS — M9902 Segmental and somatic dysfunction of thoracic region: Secondary | ICD-10-CM | POA: Diagnosis not present

## 2021-09-28 DIAGNOSIS — M545 Low back pain, unspecified: Secondary | ICD-10-CM

## 2021-09-28 DIAGNOSIS — K5904 Chronic idiopathic constipation: Secondary | ICD-10-CM | POA: Diagnosis not present

## 2021-09-28 DIAGNOSIS — M9904 Segmental and somatic dysfunction of sacral region: Secondary | ICD-10-CM

## 2021-09-28 DIAGNOSIS — M549 Dorsalgia, unspecified: Secondary | ICD-10-CM | POA: Insufficient documentation

## 2021-09-28 DIAGNOSIS — G8929 Other chronic pain: Secondary | ICD-10-CM

## 2021-09-28 HISTORY — DX: Dorsalgia, unspecified: M54.9

## 2021-09-28 HISTORY — DX: Segmental and somatic dysfunction of lumbar region: M99.03

## 2021-09-28 NOTE — Assessment & Plan Note (Signed)
Patient does have fullness of the abdomen noted.  Work-up though has been doing fairly unremarkable recently.  Patient did initially have the leiomyoma noted on the appendix.  Discussed with patient we will keep an eye on this but likely not anything secondary to this.  Questionable scar tissue formation could be within the differential but also unlikely.  Diaphragmatic irritation as well.  Attempted osteopathic manipulation with patient did respond relatively well with it.

## 2021-09-28 NOTE — Assessment & Plan Note (Signed)
We discussed different medications.  The counter that could be beneficial.  See if this responds to it.  If you are having more difficulty we can always consider advanced imaging but I think patient should hopefully do well.

## 2021-09-28 NOTE — Assessment & Plan Note (Signed)
Patient   Decision today to treat with OMT was based on Physical Exam  After verbal consent patient was treated with HVLA, ME, FPR techniques in  thoracic,  lumbar and sacral areas, all areas are chronic   Patient tolerated the procedure well with improvement in symptoms  Patient given exercises, stretches and lifestyle modifications  See medications in patient instructions if given  Patient will follow up in 4-8 weeks

## 2021-09-28 NOTE — Patient Instructions (Addendum)
Good to see you Low back exercises given Lumbar x ray on your way out  Miralax 17 mg daily and Colace '100mg'$  daily for a week Find a probiotic with 10 different strains and 10 billion units start after the week Follow up in 6-8 weeks

## 2021-10-01 ENCOUNTER — Encounter (HOSPITAL_BASED_OUTPATIENT_CLINIC_OR_DEPARTMENT_OTHER): Payer: Self-pay | Admitting: Nurse Practitioner

## 2021-10-29 NOTE — Progress Notes (Unsigned)
   CC:  headaches  Follow-up Visit  Last visit: 07/02/21  Brief HPI: 36 year old female with a history of vitamin D deficiency, viral meningitis (2006) who follows in clinic for headaches which began at age 28 following viral meningitis.  At her last visit MRI/MRV were ordered. She was referred to ophthalmology to assess for papilledema. She was continued on Topamax 100 mg daily.  Interval History: Ophthalmology exam was negative for papilledema. She continued to have headaches and Topamax was increased to 200 mg QHS. She was also started on Emgality in addition to Topamax for headache prevention.  She has had 4 Emgality injections now and has noticed improvement of her headaches. Has not had any migraines for the past 2 months. Will have occasional mild headaches that resolve with Tylenol. She has stopped Topamax. Her PCP gave her Imitrex to take as needed for migraines, but she has not tried it yet.  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  Headache days per month: 5 Headache free days per month: 25  Current Headache Regimen: Preventative: Emgality 120 mg monthly Abortive: Imitrex 50 mg PRN  Prior Therapies                                  Diamox Topamax 200 mg QHS Amitriptyline 50 mg QHS Zoloft 75 mg daily Emgality 120 mg monthly Zofran Tylenol Ibuprofen Imitrex 50 mg PRN   Physical Exam:   Vital Signs: BP 105/66   Pulse 71   Ht 5' (1.524 m)   Wt 154 lb (69.9 kg)   BMI 30.08 kg/m  GENERAL:  well appearing, in no acute distress, alert  SKIN:  Color, texture, turgor normal. No rashes or lesions HEAD:  Normocephalic/atraumatic. RESP: normal respiratory effort MSK:  No gross joint deformities.   NEUROLOGICAL: Mental Status: Alert, oriented to person, place and time, Follows commands, and Speech fluent and appropriate. Cranial Nerves: PERRL, face  symmetric, no dysarthria, hearing grossly intact Motor: moves all extremities equally Gait: normal-based.  IMPRESSION: 36 year old female with a history of vitamin D deficiency, viral meningitis (2006) who presents for follow up of headaches. Her ophthalmology exam was negative for papilledema. 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 Suspect headaches are secondary to migraines as she has had significant improvement with Emgality. Will continue Emgality for now. Encouraged her to try Imitrex as needed if she does experience another severe headache.  PLAN: -Prevention: Continue Emgality 120 mg monthly -Rescue: Imitrex 50 mg PRN   Follow-up: 6 months  I spent a total of 16 minutes on the date of the service. Headache education was done. Discussed medication side effects, adverse reactions and drug interactions. Written educational materials and patient instructions outlining all of the above were given.  Genia Harold, MD 10/30/21 2:41 PM

## 2021-10-30 ENCOUNTER — Ambulatory Visit: Payer: Medicaid Other | Admitting: Psychiatry

## 2021-10-30 VITALS — BP 105/66 | HR 71 | Ht 60.0 in | Wt 154.0 lb

## 2021-10-30 DIAGNOSIS — G43119 Migraine with aura, intractable, without status migrainosus: Secondary | ICD-10-CM | POA: Diagnosis not present

## 2021-10-30 MED ORDER — EMGALITY 120 MG/ML ~~LOC~~ SOAJ: 1.0000 "pen " | SUBCUTANEOUS | 11 refills | Status: DC

## 2021-11-09 ENCOUNTER — Ambulatory Visit: Payer: Medicaid Other | Admitting: Family Medicine

## 2021-11-19 ENCOUNTER — Encounter: Payer: Self-pay | Admitting: Psychiatry

## 2021-11-19 ENCOUNTER — Telehealth: Payer: Self-pay | Admitting: *Deleted

## 2021-11-19 NOTE — Telephone Encounter (Signed)
Emgality PA, Key: [id number], [id number]. Your information has been sent to [employer].

## 2021-11-19 NOTE — Telephone Encounter (Signed)
Emgality Approved through 11/20/2022. Faxed approval  letter to pharmacy.

## 2021-11-30 ENCOUNTER — Encounter: Payer: Self-pay | Admitting: Obstetrics

## 2021-12-06 ENCOUNTER — Ambulatory Visit: Payer: Medicaid Other | Admitting: Family Medicine

## 2021-12-17 ENCOUNTER — Encounter: Payer: Self-pay | Admitting: Obstetrics and Gynecology

## 2021-12-17 ENCOUNTER — Ambulatory Visit (HOSPITAL_BASED_OUTPATIENT_CLINIC_OR_DEPARTMENT_OTHER): Payer: Medicaid Other

## 2021-12-17 ENCOUNTER — Other Ambulatory Visit (HOSPITAL_BASED_OUTPATIENT_CLINIC_OR_DEPARTMENT_OTHER): Payer: Self-pay | Admitting: Nurse Practitioner

## 2021-12-17 ENCOUNTER — Other Ambulatory Visit (HOSPITAL_COMMUNITY)
Admission: RE | Admit: 2021-12-17 | Discharge: 2021-12-17 | Disposition: A | Discharge status: Home / Self Care | Payer: Medicaid Other | Source: Ambulatory Visit | Attending: Obstetrics and Gynecology | Admitting: Obstetrics and Gynecology

## 2021-12-17 ENCOUNTER — Ambulatory Visit (INDEPENDENT_AMBULATORY_CARE_PROVIDER_SITE_OTHER): Payer: Medicaid Other | Admitting: Obstetrics and Gynecology

## 2021-12-17 VITALS — BP 107/73 | HR 62 | Ht 60.0 in | Wt 153.0 lb

## 2021-12-17 DIAGNOSIS — N3020 Other chronic cystitis without hematuria: Secondary | ICD-10-CM | POA: Insufficient documentation

## 2021-12-17 DIAGNOSIS — N941 Unspecified dyspareunia: Secondary | ICD-10-CM | POA: Diagnosis present

## 2021-12-17 DIAGNOSIS — Z1388 Encounter for screening for disorder due to exposure to contaminants: Secondary | ICD-10-CM

## 2021-12-17 DIAGNOSIS — R519 Headache, unspecified: Secondary | ICD-10-CM

## 2021-12-17 DIAGNOSIS — E559 Vitamin D deficiency, unspecified: Secondary | ICD-10-CM

## 2021-12-17 LAB — POTASSIUM: Potassium: 4.1 mmol/L (ref 3.5–5.2)

## 2021-12-17 MED ORDER — NITROFURANTOIN MONOHYD MACRO 100 MG PO CAPS: ORAL_CAPSULE | ORAL | 0 refills | Status: DC

## 2021-12-17 MED ORDER — PHENAZOPYRIDINE HCL 200 MG PO TABS: 200.0000 mg | ORAL_TABLET | Freq: Three times a day (TID) | ORAL | 0 refills | Status: AC

## 2021-12-17 MED ORDER — KETOROLAC TROMETHAMINE 10 MG PO TABS: 10.0000 mg | ORAL_TABLET | Freq: Four times a day (QID) | ORAL | 0 refills | Status: DC | PRN

## 2021-12-17 NOTE — Progress Notes (Signed)
Ms Desanctis presents with pelvic pain for the last several months. GYN U/S normal Pain with intercourse Cycles regular with OCP's Denies any bowel or bladder dysfunction  PE AF VSS Lungs clear Herat RRR Abd soft + BS GU Nl EGBUS, + bladder tenderness, uterus small, mobile, non tender, no masses  A/P Chronic Cystitis  Reviewed with pt. Macrobid, Pyridium and Toradol. F/U in 6 weeks

## 2021-12-17 NOTE — Progress Notes (Signed)
36 y.o GYN presents for abdominal pelvic pain 6-9/10 x 5 days.  Denies dysuria.  Last PAP 07/10/2021 NILM

## 2021-12-18 LAB — CERVICOVAGINAL ANCILLARY ONLY
Bacterial Vaginitis (gardnerella): NEGATIVE
Candida Glabrata: NEGATIVE
Candida Vaginitis: NEGATIVE
Chlamydia: NEGATIVE
Comment: NEGATIVE
Comment: NEGATIVE
Comment: NEGATIVE
Comment: NEGATIVE
Comment: NEGATIVE
Comment: NORMAL
Neisseria Gonorrhea: NEGATIVE
Trichomonas: NEGATIVE

## 2021-12-18 LAB — VITAMIN D 25 HYDROXY (VIT D DEFICIENCY, FRACTURES): Vit D, 25-Hydroxy: 40.1 ng/mL (ref 30.0–100.0)

## 2021-12-20 ENCOUNTER — Ambulatory Visit (HOSPITAL_BASED_OUTPATIENT_CLINIC_OR_DEPARTMENT_OTHER): Payer: Medicaid Other | Admitting: Nurse Practitioner

## 2021-12-21 ENCOUNTER — Ambulatory Visit (INDEPENDENT_AMBULATORY_CARE_PROVIDER_SITE_OTHER): Payer: Medicaid Other | Admitting: Family Medicine

## 2021-12-21 VITALS — BP 110/72 | HR 76 | Ht 60.0 in | Wt 153.0 lb

## 2021-12-21 DIAGNOSIS — M9904 Segmental and somatic dysfunction of sacral region: Secondary | ICD-10-CM

## 2021-12-21 DIAGNOSIS — M545 Low back pain, unspecified: Secondary | ICD-10-CM

## 2021-12-21 DIAGNOSIS — M9902 Segmental and somatic dysfunction of thoracic region: Secondary | ICD-10-CM | POA: Diagnosis not present

## 2021-12-21 DIAGNOSIS — M9908 Segmental and somatic dysfunction of rib cage: Secondary | ICD-10-CM

## 2021-12-21 DIAGNOSIS — G8929 Other chronic pain: Secondary | ICD-10-CM

## 2021-12-21 DIAGNOSIS — M9903 Segmental and somatic dysfunction of lumbar region: Secondary | ICD-10-CM

## 2021-12-21 DIAGNOSIS — M9901 Segmental and somatic dysfunction of cervical region: Secondary | ICD-10-CM

## 2021-12-21 NOTE — Patient Instructions (Signed)
Lets not make any changes Careful lifting your son See me in 6 weeks

## 2021-12-21 NOTE — Assessment & Plan Note (Signed)
Chronic problem but has responded relatively well to osteopathic manipulation.  Discussed with patient again about oral anti-inflammatories, icing regimen, and proper lifting mechanics.  Patient will increase activity slowly and follow-up with me again in 6 to 8 weeks.

## 2021-12-21 NOTE — Progress Notes (Signed)
Lydia [name] Phone: [phone] Subjective:   Fontaine No, am serving as a scribe for Dr. Hulan Saas.   I'm seeing this patient by the request  of:  Early, Coralee Pesa, NP  CC: low back pain follow up   [id number]  [name] is a 36 y.o. female coming in with complaint of back and neck pain Patient states that she picked up her son and now has increase in back pain. Pain will radiate down both legs at night when she lies down her back. Denies any popping. Pain is constant but she is getting use to it.   Medications patient has been prescribed: None  Taking:       Xrays of lumbar relatively normal.   Reviewed prior external information including notes and imaging from previsou exam, outside providers and external EMR if available.   As well as notes that were available from care everywhere and other healthcare systems.  Past medical history, social, surgical and family history all reviewed in electronic medical record.  No pertanent information unless stated regarding to the chief complaint.   Past Medical History:  Diagnosis Date   Abdominal pain, epigastric 01/26/2014   Acute recurrent maxillary sinusitis 03/11/2014   Allergy    Anxiety    Chronic constipation    Motegrity helps    Chronic headaches    none x 2-3 yrs   GERD (gastroesophageal reflux disease)    HEADACHE 11/30/2007   Qualifier: Diagnosis of  By: Grier Rocher MD, Madaline Guthrie    Infection    UTI   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/22/2020   Meningitis    Ovarian cyst    Pain 12/08/2013   Dyspareunia    Pelvic pain in female 07/06/2013    Allergies  Allergen Reactions   Penicillins Hives, Itching and Rash    Has patient had a PCN reaction causing immediate rash, facial/tongue/throat swelling, SOB or lightheadedness with hypotension: Yes Has patient had a PCN reaction causing severe rash involving mucus membranes or skin necrosis:  Yes Has patient had a PCN reaction that required hospitalization no Has patient had a PCN reaction occurring within the last 10 years: No If all of the above answers are "NO", then may proceed with Cephalosporin use. Has patient had a PCN reaction causing immediate rash, facial/tongue/throat swelling, SOB or lightheadedness with hypotension: Yes Has patient had a PCN reaction causing severe rash involving mucus membranes or skin necrosis: Yes Has patient had a PCN reaction that required hospitalization no Has patient had a PCN reaction occurring within the last 10 years: No If all of the above answers are "NO", then may proceed with Cephalosporin use.     Review of Systems:  No headache, visual changes, nausea, vomiting, diarrhea, constipation, dizziness, abdominal pain, skin rash, fevers, chills, night sweats, weight loss, swollen lymph nodes, body aches, joint swelling, chest pain, shortness of breath, mood changes. POSITIVE muscle aches  Objective  Blood pressure 110/72, pulse 76, height 5' (1.524 m), weight 153 lb (69.4 kg), last menstrual period 12/06/2021, SpO2 97 %.   General: No apparent distress alert and oriented x3 mood and affect normal, dressed appropriately.  HEENT: Pupils equal, extraocular movements intact  Respiratory: Patient's speak in full sentences and does not appear short of breath  Cardiovascular: No lower extremity edema, non tender, no erythema  Gait MSK:  Back low back does have a port core strength at  the moment.  Patient does have tightness with FABER test right greater than left.  Negative straight leg test.  Tightness in the neck noted more on the left side of the neck  Osteopathic findings  C6 flexed rotated and side bent left T3 extended rotated and side bent right inhaled rib T8 extended rotated and side bent left L2 flexed rotated and side bent right L4 flexed rotated and side bent left Sacrum right on right       Assessment and  Plan:  Right-sided back pain Chronic problem but has responded relatively well to osteopathic manipulation.  Discussed with patient again about oral anti-inflammatories, icing regimen, and proper lifting mechanics.  Patient will increase activity slowly and follow-up with me again in 6 to 8 weeks.    Nonallopathic problems  Decision today to treat with OMT was based on Physical Exam  After verbal consent patient was treated with HVLA, ME, FPR techniques in cervical, rib, thoracic, lumbar, and sacral  areas  Patient tolerated the procedure well with improvement in symptoms  Patient given exercises, stretches and lifestyle modifications  See medications in patient instructions if given  Patient will follow up in 4-8 weeks      The above documentation has been reviewed and is accurate and complete Lyndal Pulley, DO        Note: This dictation was prepared with Dragon dictation along with smaller phrase technology. Any transcriptional errors that result from this process are unintentional.

## 2022-01-02 ENCOUNTER — Encounter (INDEPENDENT_AMBULATORY_CARE_PROVIDER_SITE_OTHER): Payer: Medicaid Other | Admitting: Psychiatry

## 2022-01-02 ENCOUNTER — Other Ambulatory Visit: Payer: Self-pay | Admitting: Psychiatry

## 2022-01-02 DIAGNOSIS — G43119 Migraine with aura, intractable, without status migrainosus: Secondary | ICD-10-CM

## 2022-01-02 MED ORDER — METHYLPREDNISOLONE 4 MG PO TBPK: ORAL_TABLET | ORAL | 0 refills | Status: DC

## 2022-01-02 MED ORDER — RIZATRIPTAN BENZOATE 10 MG PO TABS: 10.0000 mg | ORAL_TABLET | ORAL | 6 refills | Status: DC | PRN

## 2022-01-02 NOTE — Telephone Encounter (Signed)

## 2022-01-02 NOTE — Telephone Encounter (Signed)
Other suggestions?

## 2022-01-06 ENCOUNTER — Other Ambulatory Visit: Payer: Self-pay | Admitting: Internal Medicine

## 2022-01-07 ENCOUNTER — Ambulatory Visit (HOSPITAL_BASED_OUTPATIENT_CLINIC_OR_DEPARTMENT_OTHER): Payer: Medicaid Other | Admitting: Nurse Practitioner

## 2022-01-13 ENCOUNTER — Encounter: Payer: Self-pay | Admitting: Psychiatry

## 2022-01-15 ENCOUNTER — Other Ambulatory Visit: Payer: Self-pay | Admitting: Psychiatry

## 2022-01-15 ENCOUNTER — Telehealth: Payer: Self-pay | Admitting: *Deleted

## 2022-01-15 DIAGNOSIS — Z5181 Encounter for therapeutic drug level monitoring: Secondary | ICD-10-CM

## 2022-01-15 MED ORDER — AJOVY 225 MG/1.5ML ~~LOC~~ SOAJ
1.0000 | SUBCUTANEOUS | 6 refills | Status: DC
Start: 2022-01-15 — End: 2022-08-27

## 2022-01-15 NOTE — Telephone Encounter (Signed)
I put in an rx for Ajovy, she can try switching to this and see if it's any more effective than the Emgality. She should take the first dose one month after her last Emgality dose

## 2022-01-15 NOTE — Telephone Encounter (Signed)
Any other suggestions for pt ?

## 2022-01-15 NOTE — Telephone Encounter (Signed)
Ajovy PA< Key: [id number]. Faxed notes to be attached. Patient my have to have negative pregnancy test; she is on birth control pills.

## 2022-01-16 NOTE — Telephone Encounter (Signed)
Ajovy denied: Here are the policy requirements your request did not meet: Per your health plan's criteria, this drug is covered if you meet the following: (1) You have a negative pregnancy test (if you are a woman of childbearing age). (2) You have failed one preferred drug: Aimovig Sent to MD.

## 2022-01-16 NOTE — Telephone Encounter (Signed)
Received e mail: documents attached. [employer] is reviewing your PA request. Typically an electronic response will be received within 24-72 hours.

## 2022-01-16 NOTE — Telephone Encounter (Signed)
Let's try to appeal since she has chronic constipation. I put in an order for a pregnancy test, thanks

## 2022-01-17 ENCOUNTER — Other Ambulatory Visit (INDEPENDENT_AMBULATORY_CARE_PROVIDER_SITE_OTHER): Payer: Self-pay

## 2022-01-17 DIAGNOSIS — Z5181 Encounter for therapeutic drug level monitoring: Secondary | ICD-10-CM

## 2022-01-17 DIAGNOSIS — Z0289 Encounter for other administrative examinations: Secondary | ICD-10-CM

## 2022-01-17 NOTE — Telephone Encounter (Signed)
Patient came in, had HCG lab drawn and signed appeal form for Ajovy. I advised her once lab comes back we can send in appeal. Patient verbalized understanding, appreciation.

## 2022-01-18 ENCOUNTER — Ambulatory Visit (HOSPITAL_BASED_OUTPATIENT_CLINIC_OR_DEPARTMENT_OTHER): Payer: Medicaid Other | Admitting: Nurse Practitioner

## 2022-01-18 LAB — HCG, SERUM, QUALITATIVE: hCG,Beta Subunit,Qual,Serum: NEGATIVE m[IU]/mL (ref <6)

## 2022-01-21 NOTE — Telephone Encounter (Signed)
Pregnancy lab negative. Faxed appeal request form, lab result, office notes to [facility]. Received confirmation.

## 2022-01-23 ENCOUNTER — Other Ambulatory Visit (HOSPITAL_BASED_OUTPATIENT_CLINIC_OR_DEPARTMENT_OTHER): Payer: Self-pay

## 2022-01-23 ENCOUNTER — Ambulatory Visit (INDEPENDENT_AMBULATORY_CARE_PROVIDER_SITE_OTHER): Payer: Medicaid Other | Admitting: Nurse Practitioner

## 2022-01-23 ENCOUNTER — Encounter (HOSPITAL_BASED_OUTPATIENT_CLINIC_OR_DEPARTMENT_OTHER): Payer: Self-pay | Admitting: Nurse Practitioner

## 2022-01-23 VITALS — BP 104/65 | HR 79 | Ht 60.0 in | Wt 152.0 lb

## 2022-01-23 DIAGNOSIS — J020 Streptococcal pharyngitis: Secondary | ICD-10-CM

## 2022-01-23 HISTORY — DX: Streptococcal pharyngitis: J02.0

## 2022-01-23 LAB — POCT RAPID STREP A (OFFICE): Rapid Strep A Screen: POSITIVE — AB

## 2022-01-23 MED ORDER — CEPHALEXIN 500 MG PO CAPS
500.0000 mg | ORAL_CAPSULE | Freq: Two times a day (BID) | ORAL | 0 refills | Status: DC
  Filled 2022-01-23: qty 14, 7d supply, fill #0

## 2022-01-23 MED ORDER — PREDNISONE 20 MG PO TABS
40.0000 mg | ORAL_TABLET | Freq: Every day | ORAL | 0 refills | Status: DC
  Filled 2022-01-23: qty 10, 5d supply, fill #0

## 2022-01-23 NOTE — Patient Instructions (Addendum)
I recommend warm salt water gargles and throat sprays, like Cepacol to help with throat pain.  You can also take ibuprofen '600mg'$ -'800mg'$  every 8 hours.  If you do not feel any better in the next few days, please let us know. You should start feeling better quickly.

## 2022-01-23 NOTE — Assessment & Plan Note (Signed)
Positive strep test in office today with sore throat x 2 weeks. Recommend antibiotic treatment today. She is allergic to PCN, but has tolerated keflex in the past. We will send in keflex and steroid burst for her today given the severity of her symptoms and length of time present. She will follow-up if her symptoms do not improve in the next few days.

## 2022-01-23 NOTE — Progress Notes (Signed)
  [name], DNP, AGNP-c [facility] [address]  [place]ydia [name], [place] [address] [phone]  Subjective:   Lydia [name] is a 36 y.o. female presents to day for evaluation of: Acute Visit (Patient presents today with sore throat x 2 weeks. Patient denies fever.)  There are no diagnoses linked to this encounter.  Sore Throat Symptoms have been present for about 2 weeks She has not had any fever No other symptoms with this that she can think of During the day she is taking ibuprofen and at bedtime she is taking a sore throat and cough medicine.      PMH, Medications, and Allergies reviewed and updated in chart as appropriate.   ROS negative except for what is listed in HPI. Objective:  BP 104/65   Pulse 79   Ht 5' (1.524 m)   Wt 152 lb (68.9 kg)   SpO2 99%   BMI 29.69 kg/m  Physical Exam Vitals and nursing note reviewed.  Constitutional:      Appearance: She is ill-appearing.  HENT:     Right Ear: Tympanic membrane normal.     Left Ear: Tympanic membrane normal.     Nose: Congestion present.     Mouth/Throat:     Mouth: Mucous membranes are moist.     Pharynx: Oropharyngeal exudate and posterior oropharyngeal erythema present.  Eyes:     Pupils: Pupils are equal, round, and reactive to light.  Cardiovascular:     Rate and Rhythm: Normal rate and regular rhythm.     Pulses: Normal pulses.     Heart sounds: Normal heart sounds.  Pulmonary:     Effort: Pulmonary effort is normal.     Breath sounds: Normal breath sounds.  Lymphadenopathy:     Cervical: Cervical adenopathy present.  Skin:    General: Skin is warm and dry.     Capillary Refill: Capillary refill takes less than 2 seconds.  Neurological:     General: No focal deficit present.     Mental Status: She is alert and oriented to person, place, and time.  Psychiatric:        Mood and Affect: Mood normal.        Behavior: Behavior normal.         Thought Content: Thought content normal.        Judgment: Judgment normal.           Assessment & Plan:   Problem List Items Addressed This Visit     Strep throat - Primary    Positive strep test in office today with sore throat x 2 weeks. Recommend antibiotic treatment today. She is allergic to PCN, but has tolerated keflex in the past. We will send in keflex and steroid burst for her today given the severity of her symptoms and length of time present. She will follow-up if her symptoms do not improve in the next few days.       Relevant Medications   cephALEXin (KEFLEX) 500 MG capsule   predniSONE (DELTASONE) 20 MG tablet   Other Relevant Orders   POCT rapid strep A (Completed)      [name], DNP, AGNP-c 01/23/2022  8:41 AM    History, Medications, Surgery, SDOH, and Family History reviewed and updated as appropriate.

## 2022-01-28 ENCOUNTER — Ambulatory Visit (HOSPITAL_BASED_OUTPATIENT_CLINIC_OR_DEPARTMENT_OTHER): Payer: Medicaid Other | Admitting: Nurse Practitioner

## 2022-01-29 ENCOUNTER — Ambulatory Visit (HOSPITAL_BASED_OUTPATIENT_CLINIC_OR_DEPARTMENT_OTHER): Payer: Medicaid Other | Admitting: Nurse Practitioner

## 2022-01-30 ENCOUNTER — Ambulatory Visit: Payer: Medicaid Other | Admitting: Family Medicine

## 2022-02-01 ENCOUNTER — Ambulatory Visit: Payer: Medicaid Other | Admitting: Family Medicine

## 2022-02-04 ENCOUNTER — Encounter: Payer: Self-pay | Admitting: *Deleted

## 2022-02-04 NOTE — Telephone Encounter (Signed)
Called [facility] community plan to check status of Ajovy appeal. Spoke with Annice Needy, appeal overturned, approved 01/30/22 - 07/31/22.  Call ref #. [id number]

## 2022-02-06 ENCOUNTER — Ambulatory Visit (INDEPENDENT_AMBULATORY_CARE_PROVIDER_SITE_OTHER): Payer: Medicaid Other | Admitting: Obstetrics and Gynecology

## 2022-02-06 ENCOUNTER — Encounter: Payer: Self-pay | Admitting: Obstetrics and Gynecology

## 2022-02-06 VITALS — BP 104/69 | HR 76 | Wt 152.0 lb

## 2022-02-06 DIAGNOSIS — N941 Unspecified dyspareunia: Secondary | ICD-10-CM | POA: Diagnosis not present

## 2022-02-06 DIAGNOSIS — N3020 Other chronic cystitis without hematuria: Secondary | ICD-10-CM | POA: Diagnosis not present

## 2022-02-06 NOTE — Patient Instructions (Signed)

## 2022-02-06 NOTE — Progress Notes (Signed)
Pt states she is doing well since last visit.

## 2022-02-06 NOTE — Progress Notes (Signed)
Lydia [name] presents for f/u of chronic cystitis and painful intercourse. See prior office note. She has completed antibiotics and her Sx have completely resolved.  PE AF VSS Lungs clear Heart RRR Abd soft + BS non tender  A/P Chronic cystis        Dyspareunia.  Resolved. F/U PRN

## 2022-02-26 ENCOUNTER — Other Ambulatory Visit: Payer: Self-pay | Admitting: Neurology

## 2022-02-26 ENCOUNTER — Ambulatory Visit (HOSPITAL_BASED_OUTPATIENT_CLINIC_OR_DEPARTMENT_OTHER): Payer: Medicaid Other

## 2022-02-26 VITALS — BP 104/65 | HR 79 | Ht 60.0 in | Wt 156.0 lb

## 2022-02-26 DIAGNOSIS — R519 Headache, unspecified: Secondary | ICD-10-CM

## 2022-02-26 DIAGNOSIS — Z23 Encounter for immunization: Secondary | ICD-10-CM

## 2022-03-22 ENCOUNTER — Encounter: Payer: Self-pay | Admitting: Obstetrics and Gynecology

## 2022-03-26 ENCOUNTER — Ambulatory Visit (HOSPITAL_BASED_OUTPATIENT_CLINIC_OR_DEPARTMENT_OTHER): Payer: Medicaid Other | Admitting: Nurse Practitioner

## 2022-03-26 ENCOUNTER — Encounter (HOSPITAL_BASED_OUTPATIENT_CLINIC_OR_DEPARTMENT_OTHER): Payer: Self-pay

## 2022-03-26 NOTE — Progress Notes (Deleted)
  [name], DNP, AGNP-c Primary [facility] [address]  [place], [place] [address] [phone]  Subjective:   Lydia [name] is a 35 y.o. female presents to day for evaluation of: No chief complaint on file.  1. Sore throat 2. Otalgia, unspecified laterality ***   PMH, Medications, and Allergies reviewed and updated in chart as appropriate.   ROS negative except for what is listed in HPI. Objective:  There were no vitals taken for this visit. Physical Exam        Assessment & Plan:   Problem List Items Addressed This Visit   None Visit Diagnoses     Sore throat    -  Primary   Otalgia, unspecified laterality             [name], DNP, AGNP-c 03/26/2022  10:11 AM    History, Medications, Surgery, SDOH, and Family History reviewed and updated as appropriate.

## 2022-04-01 ENCOUNTER — Ambulatory Visit (INDEPENDENT_AMBULATORY_CARE_PROVIDER_SITE_OTHER): Payer: Medicaid Other

## 2022-04-01 ENCOUNTER — Encounter: Payer: Self-pay | Admitting: Obstetrics

## 2022-04-01 VITALS — BP 112/78 | HR 76 | Temp 98.3°F

## 2022-04-01 DIAGNOSIS — R300 Dysuria: Secondary | ICD-10-CM | POA: Diagnosis not present

## 2022-04-01 DIAGNOSIS — R3915 Urgency of urination: Secondary | ICD-10-CM | POA: Diagnosis not present

## 2022-04-01 LAB — POCT URINALYSIS DIPSTICK
Blood, UA: POSITIVE
Glucose, UA: NEGATIVE
Ketones, UA: NEGATIVE
Nitrite, UA: NEGATIVE
Protein, UA: POSITIVE — AB
Spec Grav, UA: 1.015 (ref 1.010–1.025)
Urobilinogen, UA: 1 E.U./dL
pH, UA: 7 (ref 5.0–8.0)

## 2022-04-01 MED ORDER — NITROFURANTOIN MONOHYD MACRO 100 MG PO CAPS: 100.0000 mg | ORAL_CAPSULE | Freq: Two times a day (BID) | ORAL | 1 refills | Status: DC

## 2022-04-01 NOTE — Progress Notes (Signed)
SUBJECTIVE: Lydia [name] is a 36 y.o. female who complains of urinary frequency, urgency and dysuria x 2 weeks, without flank pain, fever, chills, or abnormal vaginal discharge or bleeding.   OBJECTIVE: Appears well, in no apparent distress.  Vital signs are normal. Urine dipstick shows positive for RBC's, positive for protein, positive for bilirubin, positive for leukocytes, and positive for urobilinogen.    ASSESSMENT: Dysuria  PLAN: Treatment per orders.  Call or return to clinic prn if these symptoms worsen or fail to improve as anticipated.   Urine Culture sent to pharmacy

## 2022-04-02 ENCOUNTER — Ambulatory Visit (INDEPENDENT_AMBULATORY_CARE_PROVIDER_SITE_OTHER): Payer: Medicaid Other | Admitting: Family Medicine

## 2022-04-02 ENCOUNTER — Encounter (HOSPITAL_BASED_OUTPATIENT_CLINIC_OR_DEPARTMENT_OTHER): Payer: Self-pay | Admitting: Family Medicine

## 2022-04-02 VITALS — BP 108/77 | HR 77 | Ht 60.0 in | Wt 162.0 lb

## 2022-04-02 DIAGNOSIS — J029 Acute pharyngitis, unspecified: Secondary | ICD-10-CM

## 2022-04-02 DIAGNOSIS — J028 Acute pharyngitis due to other specified organisms: Secondary | ICD-10-CM

## 2022-04-02 DIAGNOSIS — B9789 Other viral agents as the cause of diseases classified elsewhere: Secondary | ICD-10-CM

## 2022-04-02 HISTORY — DX: Acute pharyngitis, unspecified: J02.9

## 2022-04-02 LAB — POCT RAPID STREP A (OFFICE): Rapid Strep A Screen: NEGATIVE

## 2022-04-02 MED ORDER — BENZONATATE 200 MG PO CAPS: 200.0000 mg | ORAL_CAPSULE | Freq: Three times a day (TID) | ORAL | 0 refills | Status: DC | PRN

## 2022-04-02 NOTE — Assessment & Plan Note (Addendum)
Ms. Foley is a 36 year old female presenting for evaluation of sore throat.  She reports that about 3 days ago, she began to have sore throat.  She has also had associated intermittent cough.  Denies any fevers.  She does report that her husband recently had illness with symptoms including body aches, he did not have a sore throat.  She indicates that she has been eating and drinking okay.  Has been utilizing OTC medications to help with symptoms. On exam, patient is in no acute distress, vital signs stable, patient is afebrile.  Cardiovascular exam with regular rate and rhythm, no murmur appreciated.  Lungs clear to auscultation bilaterally.  Mild pharyngeal erythema, no exudate observed.  No significant cervical lymphadenopathy. Discussed options with patient, point-of-care strep test completed in office today which was negative.  Due to this, recommend treating as viral pharyngitis.  Discussed treatment considerations, OTC medications to utilize.  Recommend ensuring adequate hydration, adequate rest at night.  Given occasional cough, will send prescription for Tessalon Perles to utilize as needed to help control symptoms

## 2022-04-02 NOTE — Progress Notes (Signed)
    Procedures performed today:    None.  Independent interpretation of notes and tests performed by another provider:   None.  Brief History, Exam, Impression, and Recommendations:    BP 108/77 (BP Location: Right Arm, Patient Position: Sitting, Cuff Size: Normal)   Pulse 77   Ht 5' (1.524 m)   Wt 162 lb (73.5 kg)   SpO2 100%   BMI 31.64 kg/m   Viral pharyngitis Ms. Tomkinson is a 36 year old female presenting for evaluation of sore throat.  She reports that about 3 days ago, she began to have sore throat.  She has also had associated intermittent cough.  Denies any fevers.  She does report that her husband recently had illness with symptoms including body aches, he did not have a sore throat.  She indicates that she has been eating and drinking okay.  Has been utilizing OTC medications to help with symptoms. On exam, patient is in no acute distress, vital signs stable, patient is afebrile.  Cardiovascular exam with regular rate and rhythm, no murmur appreciated.  Lungs clear to auscultation bilaterally.  Mild pharyngeal erythema, no exudate observed.  No significant cervical lymphadenopathy. Discussed options with patient, point-of-care strep test completed in office today which was negative.  Due to this, recommend treating as viral pharyngitis.  Discussed treatment considerations, OTC medications to utilize.  Recommend ensuring adequate hydration, adequate rest at night.  Given occasional cough, will send prescription for Tessalon Perles to utilize as needed to help control symptoms  No follow-ups on file.   ___________________________________________ Amram Maya de [place], MD, ABFM, CAQSM [facility] [place]

## 2022-04-02 NOTE — Patient Instructions (Signed)

## 2022-04-03 LAB — URINE CULTURE

## 2022-04-16 ENCOUNTER — Telehealth: Payer: Self-pay

## 2022-04-16 ENCOUNTER — Other Ambulatory Visit (HOSPITAL_COMMUNITY): Payer: Self-pay

## 2022-04-16 NOTE — Telephone Encounter (Signed)
Pharmacy Patient Advocate Encounter   Received notification from [facility] that prior authorization for MOTEGRITY 2 MG TAB is needed.    PA submitted on 04/16/22 Key [id number] Status is pending  Karie Soda, [place] Patient Advocate Specialist Direct Number: [phone] Fax: [phone]

## 2022-04-18 ENCOUNTER — Ambulatory Visit: Payer: Medicaid Other | Admitting: Family Medicine

## 2022-04-18 ENCOUNTER — Other Ambulatory Visit (HOSPITAL_COMMUNITY): Payer: Self-pay

## 2022-04-18 NOTE — Telephone Encounter (Signed)
Pharmacy Patient Advocate Encounter  Prior Authorization for MOTEGRITY 2 MG has been approved.    PA# [id number] Effective dates: 04/16/22 through 04/17/23  Karie Soda, [place] Patient Advocate Specialist Direct Number: [phone] Fax: [phone]

## 2022-04-25 ENCOUNTER — Other Ambulatory Visit: Payer: Self-pay | Admitting: Obstetrics

## 2022-04-25 ENCOUNTER — Ambulatory Visit: Payer: Medicaid Other | Admitting: Family Medicine

## 2022-04-25 DIAGNOSIS — N9412 Deep dyspareunia: Secondary | ICD-10-CM

## 2022-05-09 ENCOUNTER — Ambulatory Visit: Payer: Medicaid Other | Admitting: Psychiatry

## 2022-05-14 NOTE — Progress Notes (Unsigned)
   CC:  headaches  Follow-up Visit  Last visit: 10/30/21  Brief HPI: 37 year old female with a history of vitamin D deficiency, viral meningitis (2006) who follows in clinic for headaches which began at age 59 following viral meningitis. 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 Ophthalmology exam was negative for papilledema.  At her last visit she was continued on Emgality for migraine prevention and Imitrex for rescue.  Interval History: *** Emgality stopped being effective so she was switched to Ajovy in September 2023. Imitrex***  Headache days per month: *** Migraine days per month*** Headache free days per month: ***  Current Headache Regimen: Preventative: *** Abortive: ***   Prior Therapies                                  Preventive: Diamox Topamax 200 mg QHS Amitriptyline 50 mg QHS Zoloft 75 mg daily Emgality 120 mg monthly Ajovy  Rescue: Zofran Tylenol Ibuprofen Imitrex 50 mg PRN    Physical Exam:   Vital Signs: There were no vitals taken for this visit. GENERAL:  well appearing, in no acute distress, alert  SKIN:  Color, texture, turgor normal. No rashes or lesions HEAD:  Normocephalic/atraumatic. RESP: normal respiratory effort MSK:  No gross joint deformities.   NEUROLOGICAL: Mental Status: Alert, oriented to person, place and time, Follows commands, and Speech fluent and appropriate. Cranial Nerves: PERRL, face symmetric, no dysarthria, hearing grossly intact Motor: moves all extremities equally Gait: normal-based.  IMPRESSION: ***  PLAN: ***   Follow-up: ***  I spent a total of *** minutes on the date of the service. Headache education was done. Discussed lifestyle modification including increased oral hydration, decreased caffeine, exercise and stress management. Discussed treatment options including preventive and acute medications, natural supplements, and infusion therapy.  Discussed medication overuse headache and to limit use of acute treatments to no more than 2 days/week or 10 days/month. Discussed medication side effects, adverse reactions and drug interactions. Written educational materials and patient instructions outlining all of the above were given.  Genia Harold, MD

## 2022-05-15 ENCOUNTER — Encounter: Payer: Self-pay | Admitting: Psychiatry

## 2022-05-15 ENCOUNTER — Ambulatory Visit (INDEPENDENT_AMBULATORY_CARE_PROVIDER_SITE_OTHER): Payer: Medicaid Other | Admitting: Psychiatry

## 2022-05-15 VITALS — BP 100/61 | HR 67 | Ht <= 58 in | Wt 160.5 lb

## 2022-05-15 DIAGNOSIS — G43719 Chronic migraine without aura, intractable, without status migrainosus: Secondary | ICD-10-CM

## 2022-05-15 MED ORDER — NURTEC 75 MG PO TBDP: 75.0000 mg | ORAL_TABLET | ORAL | 0 refills | Status: DC | PRN

## 2022-05-15 NOTE — Patient Instructions (Signed)
Try Nurtec for migraine rescue. Take one pill at onset of migraine. Max dose 1 pill in 24 hours. Let me know if this is effective and I will send in a prescription

## 2022-05-19 ENCOUNTER — Telehealth: Payer: Medicaid Other | Admitting: Family

## 2022-05-19 DIAGNOSIS — R109 Unspecified abdominal pain: Secondary | ICD-10-CM

## 2022-05-19 NOTE — Progress Notes (Signed)
Because of the cramping and UTI symptoms , I feel your condition warrants further evaluation and I recommend that you be seen in a face to face visit.   NOTE: There will be NO CHARGE for this eVisit   If you are having a true medical emergency please call 911.      For an urgent face to face visit, [facility] has seven urgent care centers for your convenience:     [facility] [place] [facility] [place] Get Driving Directions [phone] [place], [place] [address]    [facility] Get Driving Directions [phone] [place], [place] [address]  [facility] [place] [facility]) Get Driving Directions [phone] [address] [place],  [place]  [address]  [facility] at [address] Get Driving Directions  [phone] [address] [place],  [place] [address]   [facility] Get Driving Directions [phone] [address] [address], [place], [place] [address]   [facility] Get Driving Directions  [phone] [address], [place] [address]   [facility] [place] Get Driving Directions [phone] [address] [place], [place] [address]  Your MyChart E-visit questionnaire answers were reviewed by a board certified advanced clinical practitioner to complete your personal care plan based on your specific symptoms.  Thank you for using e-Visits.

## 2022-05-21 ENCOUNTER — Other Ambulatory Visit (HOSPITAL_COMMUNITY)
Admission: RE | Admit: 2022-05-21 | Discharge: 2022-05-21 | Disposition: A | Discharge status: Home / Self Care | Payer: Medicaid Other | Source: Ambulatory Visit | Attending: Obstetrics | Admitting: Obstetrics

## 2022-05-21 ENCOUNTER — Ambulatory Visit (INDEPENDENT_AMBULATORY_CARE_PROVIDER_SITE_OTHER): Payer: Medicaid Other | Admitting: Obstetrics

## 2022-05-21 ENCOUNTER — Encounter: Payer: Self-pay | Admitting: Obstetrics

## 2022-05-21 VITALS — BP 105/72 | HR 77 | Ht <= 58 in | Wt 159.0 lb

## 2022-05-21 DIAGNOSIS — N930 Postcoital and contact bleeding: Secondary | ICD-10-CM

## 2022-05-21 DIAGNOSIS — N898 Other specified noninflammatory disorders of vagina: Secondary | ICD-10-CM | POA: Diagnosis not present

## 2022-05-21 DIAGNOSIS — R300 Dysuria: Secondary | ICD-10-CM

## 2022-05-21 DIAGNOSIS — R102 Pelvic and perineal pain: Secondary | ICD-10-CM | POA: Diagnosis not present

## 2022-05-21 LAB — POCT URINALYSIS DIPSTICK
Bilirubin, UA: NEGATIVE
Blood, UA: POSITIVE
Glucose, UA: NEGATIVE
Ketones, UA: NEGATIVE
Leukocytes, UA: NEGATIVE
Nitrite, UA: NEGATIVE
Protein, UA: NEGATIVE
Spec Grav, UA: 1.015 (ref 1.010–1.025)
Urobilinogen, UA: 0.2 E.U./dL
pH, UA: 8 (ref 5.0–8.0)

## 2022-05-21 MED ORDER — CEFUROXIME AXETIL 500 MG PO TABS: 500.0000 mg | ORAL_TABLET | Freq: Two times a day (BID) | ORAL | 0 refills | Status: DC

## 2022-05-21 NOTE — Progress Notes (Signed)
37 y.o. GYN presents for urinary frequency, vaginal itching, yellow discharge, dysuria, flank pain, nausea.  Denies fever, chills x 8 days.

## 2022-05-21 NOTE — Progress Notes (Signed)
Patient ID: Jackqulyn Livings, female   DOB: [birth year], 37 y.o.   MRN: [id number]  Chief Complaint  Patient presents with   Urinary Frequency    HPI Seriyah A Trumbo is a 37 y.o. female.  Complains of urinary frequency, burning and back pain.  Also c/o yellowish vaginal discharge.  She states that she has bleeding during intercourse.  Denies fever, chills, or pelvic pain. HPI  Past Medical History:  Diagnosis Date   Abdominal pain, epigastric 01/26/2014   Acute recurrent maxillary sinusitis 03/11/2014   Allergy    Anxiety    Chronic constipation    Motegrity helps    Chronic headaches    none x 2-3 yrs   GERD (gastroesophageal reflux disease)    HEADACHE 11/30/2007   Qualifier: Diagnosis of  By: Grier Rocher MD, Madaline Guthrie    Infection    UTI   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/22/2020   Meningitis    Ovarian cyst    Pain 12/08/2013   Dyspareunia    Pelvic pain in female 07/06/2013    Past Surgical History:  Procedure Laterality Date   CHOLECYSTECTOMY  2016   COLONOSCOPY     LAPAROSCOPIC APPENDECTOMY N/A 11/10/2020   Procedure: LAPAROSCOPIC APPENDECTOMY;  Surgeon: Mickeal Skinner, MD;  Location: [facility];  Service: General;  Laterality: N/A;   UPPER GASTROINTESTINAL ENDOSCOPY  2015   WISDOM TOOTH EXTRACTION      Family History  Problem Relation Age of Onset   Migraines Mother    Hypertension Mother    Asthma Father    Birth defects Daughter        Limb reduction defect   Breast cancer Maternal Aunt 42   Colon cancer Neg Hx    Esophageal cancer Neg Hx    Rectal cancer Neg Hx    Stomach cancer Neg Hx    Colon polyps Neg Hx     Social History Social History   Tobacco Use   Smoking status: Never   Smokeless tobacco: Never  Vaping Use   Vaping Use: Never used  Substance Use Topics   Alcohol use: Yes    Comment: occ   Drug use: No    Allergies  Allergen Reactions   Penicillins Hives, Itching and Rash    Has patient had a PCN reaction causing immediate rash,  facial/tongue/throat swelling, SOB or lightheadedness with hypotension: Yes Has patient had a PCN reaction causing severe rash involving mucus membranes or skin necrosis: Yes Has patient had a PCN reaction that required hospitalization no Has patient had a PCN reaction occurring within the last 10 years: No If all of the above answers are "NO", then may proceed with Cephalosporin use. Has patient had a PCN reaction causing immediate rash, facial/tongue/throat swelling, SOB or lightheadedness with hypotension: Yes Has patient had a PCN reaction causing severe rash involving mucus membranes or skin necrosis: Yes Has patient had a PCN reaction that required hospitalization no Has patient had a PCN reaction occurring within the last 10 years: No If all of the above answers are "NO", then may proceed with Cephalosporin use.    Current Outpatient Medications  Medication Sig Dispense Refill   cefUROXime (CEFTIN) 500 MG tablet Take 1 tablet (500 mg total) by mouth 2 (two) times daily with a meal. 14 tablet 0   sertraline (ZOLOFT) 50 MG tablet Take 1.5 tablets (75 mg total) by mouth at bedtime. 45 tablet 3   benzonatate (TESSALON) 200 MG capsule Take 1  capsule (200 mg total) by mouth 3 (three) times daily as needed for cough. (Patient not taking: Reported on 05/15/2022) 45 capsule 0   busPIRone (BUSPAR) 5 MG tablet Take 1 tablet (5 mg total) by mouth daily. May also take 1 tablet (5 mg total) 2 (two) times daily as needed. For anxiety. 90 tablet 3   Cholecalciferol (VITAMIN D3) 50 MCG (2000 UT) capsule Take 1 capsule (2,000 Units total) by mouth daily. 30 capsule 11   cromolyn (OPTICROM) 4 % ophthalmic solution 1 drop 4 (four) times daily.     Fremanezumab-vfrm (AJOVY) 225 MG/1.5ML SOAJ Inject 1 Pen into the skin every 30 (thirty) days. 1.68 mL 6   hydrOXYzine (VISTARIL) 50 MG capsule Take 1 capsule (50 mg total) by mouth at bedtime and may repeat dose one time if needed. For sleep and anxiety. (Patient  not taking: Reported on 05/21/2022) 60 capsule 6   ibuprofen (ADVIL) 800 MG tablet TAKE 1 TABLET(800 MG) BY MOUTH EVERY 8 HOURS AS NEEDED 30 tablet 5   JUNEL FE 1.5/30 1.5-30 MG-MCG tablet Take 1 tablet by mouth daily.     ketorolac (TORADOL) 10 MG tablet Take 1 tablet (10 mg total) by mouth every 6 (six) hours as needed. (Patient not taking: Reported on 05/15/2022) 20 tablet 0   MOTEGRITY 2 MG TABS TAKE 1 TABLET(2 MG) BY MOUTH DAILY 30 tablet 3   ondansetron (ZOFRAN-ODT) 8 MG disintegrating tablet Take 1 tablet (8 mg total) by mouth every 8 (eight) hours as needed for nausea. (Patient not taking: Reported on 05/15/2022) 20 tablet 3   potassium chloride (KLOR-CON M) 10 MEQ tablet Take 1 tablet (10 mEq total) by mouth at bedtime. (Patient not taking: Reported on 05/21/2022) 30 tablet 11   predniSONE (DELTASONE) 20 MG tablet Take 2 tablets (40 mg total) by mouth daily with breakfast. (Patient not taking: Reported on 05/15/2022) 10 tablet 0   Rimegepant Sulfate (NURTEC) 75 MG TBDP Take 75 mg by mouth as needed. 2 tablet 0   No current facility-administered medications for this visit.    Review of Systems Review of Systems Constitutional: negative for fatigue and weight loss Respiratory: negative for cough and wheezing Cardiovascular: negative for chest pain, fatigue and palpitations Gastrointestinal: negative for abdominal pain and change in bowel habits Genitourinary: positive for vaginal discharge, urinary frequency, burning and back pain Integument/breast: negative for nipple discharge Musculoskeletal:negative for myalgias Neurological: negative for gait problems and tremors Behavioral/Psych: negative for abusive relationship, depression Endocrine: negative for temperature intolerance      Blood pressure 105/72, pulse 77, height '4\' 8"'$  (1.422 m), weight 159 lb (72.1 kg), last menstrual period 04/21/2022.  Physical Exam Physical Exam General:   Alert and no distress  Skin:   no rash or  abnormalities  Lungs:   clear to auscultation bilaterally  Heart:   regular rate and rhythm, S1, S2 normal, no murmur, click, rub or gallop  Breasts:   Not examined  Abdomen:  normal findings: no organomegaly, soft, non-tender and no hernia  Pelvis:  External genitalia: normal general appearance Urinary system: urethral meatus normal and bladder without fullness, nontender Vaginal: normal without tenderness, induration or masses Cervix: normal appearance Adnexa: normal bimanual exam Uterus: anteverted and non-tender, normal size    I have spent a total of 20 minutes of face-to-face time, excluding clinical staff time, reviewing notes and preparing to see patient, ordering tests and/or medications, and counseling the patient.   Data Reviewed Urinalysis Wet Prep  Assessment  1. Dysuria Rx: - POCT Urinalysis Dipstick - Urine Culture - cefUROXime (CEFTIN) 500 MG tablet; Take 1 tablet (500 mg total) by mouth 2 (two) times daily with a meal.  Dispense: 14 tablet; Refill: 0  2. Vaginal discharge Rx: - Cervicovaginal ancillary only( [facility])  3. PCB (post coital bleeding) Rx: - US PELVIC COMPLETE WITH TRANSVAGINAL; Future  4. Pelvic pain Rx: - US PELVIC COMPLETE WITH TRANSVAGINAL; Future     Plan    Orders Placed This Encounter  Procedures   Urine Culture   US PELVIC COMPLETE WITH TRANSVAGINAL    Standing Status:   Future    Standing Expiration Date:   05/22/2023    Order Specific Question:   Reason for Exam (SYMPTOM  OR DIAGNOSIS REQUIRED)    Answer:   coital bleeding    Order Specific Question:   Preferred imaging location?    Answer:   [facility] Ul[facility]ound   POCT Urinalysis Dipstick   Meds ordered this encounter  Medications   cefUROXime (CEFTIN) 500 MG tablet    Sig: Take 1 tablet (500 mg total) by mouth 2 (two) times daily with a meal.    Dispense:  14 tablet    Refill:  0    Shelly Bombard, MD 05/21/2022 10:45 AM

## 2022-05-22 ENCOUNTER — Encounter: Payer: Self-pay | Admitting: Psychiatry

## 2022-05-22 LAB — CERVICOVAGINAL ANCILLARY ONLY
Bacterial Vaginitis (gardnerella): NEGATIVE
Candida Glabrata: NEGATIVE
Candida Vaginitis: NEGATIVE
Chlamydia: NEGATIVE
Comment: NEGATIVE
Comment: NEGATIVE
Comment: NEGATIVE
Comment: NEGATIVE
Comment: NEGATIVE
Comment: NORMAL
Neisseria Gonorrhea: NEGATIVE
Trichomonas: NEGATIVE

## 2022-05-23 ENCOUNTER — Other Ambulatory Visit: Payer: Self-pay

## 2022-05-23 LAB — URINE CULTURE

## 2022-05-23 MED ORDER — NURTEC 75 MG PO TBDP: 75.0000 mg | ORAL_TABLET | ORAL | 3 refills | Status: DC

## 2022-05-27 ENCOUNTER — Telehealth: Payer: Self-pay

## 2022-05-27 ENCOUNTER — Ambulatory Visit: Payer: Self-pay

## 2022-05-27 ENCOUNTER — Other Ambulatory Visit (HOSPITAL_BASED_OUTPATIENT_CLINIC_OR_DEPARTMENT_OTHER): Payer: Self-pay | Admitting: Nurse Practitioner

## 2022-05-27 ENCOUNTER — Ambulatory Visit (INDEPENDENT_AMBULATORY_CARE_PROVIDER_SITE_OTHER): Payer: Medicaid Other | Admitting: Orthopedic Surgery

## 2022-05-27 ENCOUNTER — Encounter: Payer: Self-pay | Admitting: Orthopedic Surgery

## 2022-05-27 DIAGNOSIS — M545 Low back pain, unspecified: Secondary | ICD-10-CM

## 2022-05-27 DIAGNOSIS — R519 Headache, unspecified: Secondary | ICD-10-CM

## 2022-05-27 MED ORDER — PREDNISONE 5 MG (21) PO TBPK: ORAL_TABLET | ORAL | 0 refills | Status: DC

## 2022-05-27 MED ORDER — METHOCARBAMOL 500 MG PO TABS: 500.0000 mg | ORAL_TABLET | Freq: Three times a day (TID) | ORAL | 0 refills | Status: DC | PRN

## 2022-05-27 NOTE — Telephone Encounter (Signed)
Approved today Request Reference Number: [id number]. NURTEC TAB '75MG'$  ODT is approved through 05/28/2023

## 2022-05-27 NOTE — Progress Notes (Signed)
Office Visit Note   Patient: Lydia [name]           Date of Birth: Mar 22, 1986           MRN: [id number] Visit Date: 05/27/2022 Requested by: de [place], Raymond J, MD [address] [place],  [place] [address] PCP: de [place], Raymond J, MD  Subjective: Chief Complaint  Patient presents with   Lower Back - Pain    HPI: Lydia [name] is a 37 y.o. female who presents to the office reporting a 3-week history of acute onset low back pain.  Started 05/13/2022.  On New Year's Eve she was having some right-sided pain which sounds like it was more facet mediated.  That improved but then the mid back started hurting a day later.  The pain comes and goes.  Does not radiate down the legs but it does radiate around into her lower abdominal region.  She has had a, gynecologic appointment which was okay.  She does have some pain with sneezing.  Tries ibuprofen.  She is a stay-at-home mother.  Having some itching along the back..                ROS: All systems reviewed are negative as they relate to the chief complaint within the history of present illness.  Patient denies fevers or chills.  Assessment & Plan: Visit Diagnoses:  1. Low back pain, unspecified back pain laterality, unspecified chronicity, unspecified whether sciatica present     Plan: Impression is low back pain.  She does have what appears to be a birthmark which does cross the midline.  Consideration for shingles was also given.  Radiographs unremarkable and no nerve root tension signs or weakness today.  Plan is Medrol Dosepak 6-day course of Robaxin and 3-week return with decision for or against MRI scanning at that time.  We will also look at that rash in the midportion of her back at that time as well.  Follow-Up Instructions: No follow-ups on file.   Orders:  Orders Placed This Encounter  Procedures   XR Lumbar Spine 2-3 Views   Meds ordered this encounter  Medications   predniSONE (STERAPRED UNI-PAK 21 TAB) 5 MG (21) TBPK  tablet    Sig: Take dosepak as directed    Dispense:  21 tablet    Refill:  0   methocarbamol (ROBAXIN) 500 MG tablet    Sig: Take 1 tablet (500 mg total) by mouth every 8 (eight) hours as needed for muscle spasms.    Dispense:  30 tablet    Refill:  0      Procedures: No procedures performed   Clinical Data: No additional findings.  Objective: Vital Signs: LMP 04/21/2022 (Exact Date)   Physical Exam:  Constitutional: Patient appears well-developed HEENT:  Head: Normocephalic Eyes:EOM are normal Neck: Normal range of motion Cardiovascular: Normal rate Pulmonary/chest: Effort normal Neurologic: Patient is alert Skin: Skin is warm Psychiatric: Patient has normal mood and affect  Ortho Exam: Ortho exam demonstrates normal gait and alignment.  Reflexes 1+ out of 4 bilateral patella and Achilles.  Pedal pulses palpable.  No groin pain with internal/external Tatian of either leg.  No nerve root tension signs.  She has 5 out of 5 ankle dorsiflexion plantarflexion quad and hamstring strength.  No overt pain with forward and lateral bending.  She does have a small rash crossing the midline of her back measuring about 4 x 5 cm.  No vesicles.  Slightly redder than  I would expect for an established birthmark.  Is not raised.  Specialty Comments:  No specialty comments available.  Imaging: No results found.   PMFS History: Patient Active Problem List   Diagnosis Date Noted   Viral pharyngitis 04/02/2022   Strep throat 01/23/2022   Chronic cystitis 12/17/2021   Right-sided back pain 09/28/2021   Somatic dysfunction of spine, lumbar 09/28/2021   Insomnia 07/09/2021   Panic attack 06/06/2021   Paresthesia 06/06/2021   Generalized anxiety disorder 04/20/2021   Chronic daily headache 04/20/2021   Chronic idiopathic constipation 03/29/2021   Family history of congenital anomalies    Dyspareunia, female 03/11/2014   Tinea versicolor 12/08/2013   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   Past  Medical History:  Diagnosis Date   Abdominal pain, epigastric 01/26/2014   Acute recurrent maxillary sinusitis 03/11/2014   Allergy    Anxiety    Chronic constipation    Motegrity helps    Chronic headaches    none x 2-3 yrs   GERD (gastroesophageal reflux disease)    HEADACHE 11/30/2007   Qualifier: Diagnosis of  By: Grier Rocher MD, Madaline Guthrie    Infection    UTI   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/22/2020   Meningitis    Ovarian cyst    Pain 12/08/2013   Dyspareunia    Pelvic pain in female 07/06/2013    Family History  Problem Relation Age of Onset   Migraines Mother    Hypertension Mother    Asthma Father    Birth defects Daughter        Limb reduction defect   Breast cancer Maternal Aunt 42   Colon cancer Neg Hx    Esophageal cancer Neg Hx    Rectal cancer Neg Hx    Stomach cancer Neg Hx    Colon polyps Neg Hx     Past Surgical History:  Procedure Laterality Date   CHOLECYSTECTOMY  2016   COLONOSCOPY     LAPAROSCOPIC APPENDECTOMY N/A 11/10/2020   Procedure: LAPAROSCOPIC APPENDECTOMY;  Surgeon: Kinsinger, Arta Bruce, MD;  Location: [facility];  Service: General;  Laterality: N/A;   UPPER GASTROINTESTINAL ENDOSCOPY  2015   WISDOM TOOTH EXTRACTION     Social History   Occupational History   Not on file  Tobacco Use   Smoking status: Never   Smokeless tobacco: Never  Vaping Use   Vaping Use: Never used  Substance and Sexual Activity   Alcohol use: Yes    Comment: occ   Drug use: No   Sexual activity: Yes    Partners: Male    Birth control/protection: Pill, OCP

## 2022-05-27 NOTE — Telephone Encounter (Signed)
PA submitted via CMM  Key: [facility] is reviewing your PA request. Typically an electronic response will be received within 24-72 hours

## 2022-05-28 ENCOUNTER — Ambulatory Visit (HOSPITAL_BASED_OUTPATIENT_CLINIC_OR_DEPARTMENT_OTHER)
Admission: RE | Admit: 2022-05-28 | Discharge: 2022-05-28 | Disposition: A | Discharge status: Home / Self Care | Payer: Medicaid Other | Source: Ambulatory Visit | Attending: Obstetrics | Admitting: Obstetrics

## 2022-05-28 DIAGNOSIS — R102 Pelvic and perineal pain: Secondary | ICD-10-CM

## 2022-05-28 DIAGNOSIS — N930 Postcoital and contact bleeding: Secondary | ICD-10-CM | POA: Diagnosis not present

## 2022-06-03 ENCOUNTER — Ambulatory Visit: Payer: Medicaid Other | Admitting: Orthopedic Surgery

## 2022-06-04 ENCOUNTER — Encounter: Payer: Self-pay | Admitting: Obstetrics

## 2022-06-04 ENCOUNTER — Telehealth (INDEPENDENT_AMBULATORY_CARE_PROVIDER_SITE_OTHER): Payer: Medicaid Other | Admitting: Obstetrics

## 2022-06-04 DIAGNOSIS — R102 Pelvic and perineal pain: Secondary | ICD-10-CM

## 2022-06-04 DIAGNOSIS — R300 Dysuria: Secondary | ICD-10-CM | POA: Diagnosis not present

## 2022-06-04 DIAGNOSIS — M549 Dorsalgia, unspecified: Secondary | ICD-10-CM | POA: Diagnosis not present

## 2022-06-04 NOTE — Progress Notes (Signed)
GYNECOLOGY VIRTUAL VISIT ENCOUNTER NOTE  Provider location: [facility] [place] [place] [facility]   Patient location: Home  I connected with [place] on 06/04/22 at  1:30 PM EST by MyChart Video Encounter and verified that I am speaking with the correct person using two identifiers.   I discussed the limitations, risks, security and privacy concerns of performing an evaluation and management service virtually and the availability of in person appointments. I also discussed with the patient that there may be a patient responsible charge related to this service. The patient expressed understanding and agreed to proceed.   History:  Lydia [name] is a 37 y.o. [phone] female being evaluated today for follow up for dysuria and backache. She denies any abnormal vaginal discharge, bleeding, pelvic pain or other concerns.       Past Medical History:  Diagnosis Date   Abdominal pain, epigastric 01/26/2014   Acute recurrent maxillary sinusitis 03/11/2014   Allergy    Anxiety    Chronic constipation    Motegrity helps    Chronic headaches    none x 2-3 yrs   GERD (gastroesophageal reflux disease)    HEADACHE 11/30/2007   Qualifier: Diagnosis of  By: Grier Rocher MD, Madaline Guthrie    Infection    UTI   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/22/2020   Meningitis    Ovarian cyst    Pain 12/08/2013   Dyspareunia    Pelvic pain in female 07/06/2013   Past Surgical History:  Procedure Laterality Date   CHOLECYSTECTOMY  2016   COLONOSCOPY     LAPAROSCOPIC APPENDECTOMY N/A 11/10/2020   Procedure: LAPAROSCOPIC APPENDECTOMY;  Surgeon: Mickeal Skinner, MD;  Location: [facility];  Service: General;  Laterality: N/A;   UPPER GASTROINTESTINAL ENDOSCOPY  2015   WISDOM TOOTH EXTRACTION     The following portions of the patient's history were reviewed and updated as appropriate: allergies, current medications, past family history, past medical history, past social history, past surgical history and  problem list.   Health Maintenance:  Normal pap and negative HRHPV on 07-10-21.    Review of Systems:  Pertinent items noted in HPI and remainder of comprehensive ROS otherwise negative.  Physical Exam:   General:  Alert, oriented and cooperative. Patient appears to be in no acute distress.  Mental Status: Normal mood and affect. Normal behavior. Normal judgment and thought content.   Respiratory: Normal respiratory effort, no problems with respiration noted  Rest of physical exam deferred due to type of encounter  Labs and Imaging No results found for this or any previous visit (from the past 336 hour(s)). US PELVIC COMPLETE WITH TRANSVAGINAL  Result Date: 05/28/2022 CLINICAL DATA:  Coital bleeding, LMP 05/23/2022 EXAM: TRANSABDOMINAL AND TRANSVAGINAL ULTRASOUND OF PELVIS TECHNIQUE: Both transabdominal and transvaginal ultrasound examinations of the pelvis were performed. Transabdominal technique was performed for global imaging of the pelvis including uterus, ovaries, adnexal regions, and pelvic cul-de-sac. It was necessary to proceed with endovaginal exam following the transabdominal exam to visualize the uterus, endometrium, and ovaries. COMPARISON:  09/13/2021 FINDINGS: Uterus Measurements: 8.7 x 3.5 x 5.2 cm = volume: 82 mL. Anteverted. Normal morphology with questionable intramural leiomyoma posterior upper uterus 10 mm greatest diameter. No additional masses. Nabothian cysts at cervix. Endometrium Thickness: 3 mm.  Trace endometrial fluid.  No mass. Right ovary Measurements: 2.8 x 1.5 x 1.5 cm = volume: 3.3 mL. Normal morphology without mass Left ovary Measurements: 2.4 x 1.3 x 2.0  cm = volume: 3.4 mL. Normal morphology without mass Other findings No free pelvic fluid or adnexal masses. IMPRESSION: Questionable 10 mm intramural leiomyoma posterior upper uterus. Trace nonspecific endometrial fluid. Remainder of exam unremarkable. Electronically Signed   By: Lavonia Dana M.D.   On: 05/28/2022  12:10       Assessment and Plan:     1. Dysuria Rx: - Ambulatory referral to Urogynecology  2. Pelvic pain Rx: - Ambulatory referral to Urogynecology  3. Backache symptom Rx: - Ambulatory referral to Urogynecology       I discussed the assessment and treatment plan with the patient. The patient was provided an opportunity to ask questions and all were answered. The patient agreed with the plan and demonstrated an understanding of the instructions.   The patient was advised to call back or seek an in-person evaluation/go to the ED if the symptoms worsen or if the condition fails to improve as anticipated.  I have spent a total of 20 minutes of face-to-face time, excluding clinical staff time, reviewing notes and preparing to see patient, ordering tests and/or medications, and counseling the patient.    Baltazar Najjar, MD [facility] for [facility], [place], [place] 06/04/2022

## 2022-06-06 ENCOUNTER — Encounter: Payer: Self-pay | Admitting: Obstetrics

## 2022-06-12 ENCOUNTER — Ambulatory Visit (INDEPENDENT_AMBULATORY_CARE_PROVIDER_SITE_OTHER): Payer: Medicaid Other | Admitting: Surgical

## 2022-06-12 ENCOUNTER — Encounter: Payer: Self-pay | Admitting: Surgical

## 2022-06-12 DIAGNOSIS — M545 Low back pain, unspecified: Secondary | ICD-10-CM | POA: Diagnosis not present

## 2022-06-12 NOTE — Progress Notes (Signed)
Office Visit Note   Patient: Lydia [name]           Date of Birth: 10-Jul-1985           MRN: [id number] Visit Date: 06/12/2022 Requested by: de [name], Lydia J, MD [address] [place],  [place] [address] PCP: Lydia Render, NP  Subjective: Chief Complaint  Patient presents with   Lower Back - Pain    HPI: Lydia [name] is a 37 y.o. female who presents to the office reporting low back pain.  Patient states that she has continued low back pain which is not improving whatsoever.  She had brief respite for a couple days before occurrence of her low back pain.  She has had symptoms ongoing now for greater than 1 month.  She describes constant pain in the midline of her low back with no radiation down the legs.  She cannot lay on her stomach or on her back due to pain.  She is okay laying on her side to some extent.  No history of prior hip or back surgery.  Pain is similar in intensity to contractions from her pregnancies.  No red flag symptoms such as bowel/bladder incontinence or saddle anesthesia.  Flexion and extension of the spine causes discomfort.  Ibuprofen has not provided any relief.  She has tried [employer] but this causes her to itch.  Muscle relaxer with no relief.  She has difficulty putting pressure on her buttocks at times and has to [place] shift her weight to improve her symptoms.  No groin pain.  No weakness in either leg..                ROS: All systems reviewed are negative as they relate to the chief complaint within the history of present illness.  Patient denies fevers or chills.  Assessment & Plan: Visit Diagnoses:  1. Low back pain, unspecified back pain laterality, unspecified chronicity, unspecified whether sciatica present     Plan: Patient is a 37 year old female who presents for evaluation of low back pain.  She has had symptoms going on longer than a month.  No significant lasting relief from any medications that she has tried.  Not really able to do physical  therapy exercises due to the severity of her pain.  She is overall pretty miserable.  She has constant pain and nothing has really given her long-term relief.  No history of prior hip or back surgery/severe pain like this.  Radiographs taken several weeks ago when she saw Lydia [name] showed no significant radiographic findings.  This all began when she bent over greater than a month ago.  Plan for further evaluation of potential disc herniation with lumbar spine MRI.  Follow-up after MRI.  Follow-Up Instructions: No follow-ups on file.   Orders:  Orders Placed This Encounter  Procedures   MR Lumbar Spine w/o contrast   No orders of the defined types were placed in this encounter.     Procedures: No procedures performed   Clinical Data: No additional findings.  Objective: Vital Signs: LMP 04/21/2022 (Exact Date)   Physical Exam:  Constitutional: Patient appears well-developed HEENT:  Head: Normocephalic Eyes:EOM are normal Neck: Normal range of motion Cardiovascular: Normal rate Pulmonary/chest: Effort normal Neurologic: Patient is alert Skin: Skin is warm Psychiatric: Patient has normal mood and affect  Ortho Exam: Ortho exam demonstrates tenderness throughout the axial lumbar spine, greatest around L5.  She has increased pain with flexion extension of the spine.  She is visibly uncomfortable during the exam.  She has no pain with hip range of motion.  Negative straight leg raise bilaterally.  5/5 motor strength of bilateral hip flexion, quadricep, hamstring, dorsiflexion, plantarflexion.  2+ patellar tendon reflexes.  No clonus noted.  Specialty Comments:  No specialty comments available.  Imaging: No results found.   PMFS History: Patient Active Problem List   Diagnosis Date Noted   Viral pharyngitis 04/02/2022   Strep throat 01/23/2022   Chronic cystitis 12/17/2021   Right-sided back pain 09/28/2021   Somatic dysfunction of spine, lumbar 09/28/2021   Insomnia  07/09/2021   Panic attack 06/06/2021   Paresthesia 06/06/2021   Generalized anxiety disorder 04/20/2021   Chronic daily headache 04/20/2021   Chronic idiopathic constipation 03/29/2021   Family history of congenital anomalies    Dyspareunia, female 03/11/2014   Tinea versicolor 12/08/2013   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   Past Medical History:  Diagnosis Date   Abdominal pain, epigastric 01/26/2014   Acute recurrent maxillary sinusitis 03/11/2014   Allergy    Anxiety    Chronic constipation    Motegrity helps    Chronic headaches    none x 2-3 yrs   GERD (gastroesophageal reflux disease)    HEADACHE 11/30/2007   Qualifier: Diagnosis of  By: Lydia Rocher MD, Lydia [name]    Infection    UTI   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/22/2020   Meningitis    Ovarian cyst    Pain 12/08/2013   Dyspareunia    Pelvic pain in female 07/06/2013    Family History  Problem Relation Age of Onset   Migraines Mother    Hypertension Mother    Asthma Father    Birth defects Daughter        Limb reduction defect   Breast cancer Maternal Aunt 42   Colon cancer Neg Hx    Esophageal cancer Neg Hx    Rectal cancer Neg Hx    Stomach cancer Neg Hx    Colon polyps Neg Hx     Past Surgical History:  Procedure Laterality Date   CHOLECYSTECTOMY  2016   COLONOSCOPY     LAPAROSCOPIC APPENDECTOMY N/A 11/10/2020   Procedure: LAPAROSCOPIC APPENDECTOMY;  Surgeon: [name], Lydia Bruce, MD;  Location: [facility];  Service: General;  Laterality: N/A;   UPPER GASTROINTESTINAL ENDOSCOPY  2015   WISDOM TOOTH EXTRACTION     Social History   Occupational History   Not on file  Tobacco Use   Smoking status: Never   Smokeless tobacco: Never  Vaping Use   Vaping Use: Never used  Substance and Sexual Activity   Alcohol use: Yes    Comment: occ   Drug use: No   Sexual activity: Yes    Partners: Male    Birth control/protection: Pill, OCP

## 2022-06-13 ENCOUNTER — Ambulatory Visit (INDEPENDENT_AMBULATORY_CARE_PROVIDER_SITE_OTHER): Payer: Medicaid Other | Admitting: Nurse Practitioner

## 2022-06-13 ENCOUNTER — Encounter: Payer: Self-pay | Admitting: Nurse Practitioner

## 2022-06-13 ENCOUNTER — Other Ambulatory Visit: Payer: Self-pay

## 2022-06-13 ENCOUNTER — Telehealth: Payer: Self-pay | Admitting: Family Medicine

## 2022-06-13 VITALS — BP 122/80 | HR 75 | Wt 162.2 lb

## 2022-06-13 DIAGNOSIS — J029 Acute pharyngitis, unspecified: Secondary | ICD-10-CM | POA: Diagnosis not present

## 2022-06-13 MED ORDER — FAMOTIDINE 40 MG PO TABS: 40.0000 mg | ORAL_TABLET | Freq: Every day | ORAL | 0 refills | Status: DC

## 2022-06-13 MED ORDER — OMEPRAZOLE 40 MG PO CPDR: 40.0000 mg | DELAYED_RELEASE_CAPSULE | Freq: Every day | ORAL | 11 refills | Status: DC

## 2022-06-13 NOTE — Patient Instructions (Signed)
I have sent in two medications for you.   The omeprazole you can take every day. Take for at least 2 months.  The famotidine is for 7-14 days then stop and see if your symptoms are better.   If no change after 1 month, please send me a message and let me know.

## 2022-06-13 NOTE — Progress Notes (Signed)
[name], DNP, AGNP-c [address] [place], [place] [address] [phone]  Subjective:   Lydia [name] is a 36 y.o. female presents to day for evaluation of: Sore throat [name] endorses concerns today with a chronic sore throat that feels as though it is worsening this week.  She does have a cough present with this.  She does not have any cold or flu symptoms at this time.  She does have a history of reflux.  No nausea or vomiting or  PMH, Medications, and Allergies reviewed and updated in chart as appropriate.   ROS negative except for what is listed in HPI. Objective:  BP 122/80   Pulse 75   Wt 162 lb 3.2 oz (73.6 kg)   LMP 04/21/2022 (Exact Date)   BMI 36.36 kg/m  Physical Exam Vitals and nursing note reviewed.  Constitutional:      Appearance: Normal appearance. She is not ill-appearing.  HENT:     Head: Normocephalic and atraumatic.     Nose: Nose normal.     Mouth/Throat:     Mouth: Mucous membranes are moist.     Pharynx: Oropharynx is clear. Posterior oropharyngeal erythema present. No oropharyngeal exudate.  Eyes:     Pupils: Pupils are equal, round, and reactive to light.  Cardiovascular:     Rate and Rhythm: Normal rate and regular rhythm.     Pulses: Normal pulses.     Heart sounds: Normal heart sounds.  Pulmonary:     Effort: Pulmonary effort is normal.     Breath sounds: Normal breath sounds.  Abdominal:     General: Abdomen is flat. Bowel sounds are normal. There is no distension.     Palpations: Abdomen is soft.     Tenderness: There is abdominal tenderness in the epigastric area. There is no guarding or rebound.  Musculoskeletal:        General: Normal range of motion.     Cervical back: Normal range of motion and neck supple.  Lymphadenopathy:     Cervical: No cervical adenopathy.  Skin:    General: Skin is warm and dry.     Capillary Refill: Capillary refill takes less than 2 seconds.  Neurological:      General: No focal deficit present.     Mental Status: She is alert and oriented to person, place, and time.  Psychiatric:        Mood and Affect: Mood normal.           Assessment & Plan:   Problem List Items Addressed This Visit     Reflux pharyngitis - Primary    Chronic sore throat with worsening symptoms present for the past week.  On examination the posterior oropharynx is mildly erythematous with no signs of drainage or exudate present.  She does also have epigastric tenderness.  She has no other upper respiratory findings and is currently afebrile.  Given her history of reflux I feel that she is currently having an exacerbation of GERD causing reflux pharyngitis.  Discussion of medication options with patient today.  Joint decision made to begin omeprazole 40 mg twice a day for the next 2 weeks and continue with once a day dosing for at least 6 additional weeks to allow for reduced acid production and time for healing in the esophagus.  If symptoms or not improved at all within the next 2 weeks recommend follow-up for further evaluation.  She has seen GI in the past and may need  to make an appointment for further evaluation if this continues.      Relevant Medications   omeprazole (PRILOSEC) 40 MG capsule      [name], DNP, AGNP-c 06/21/2022  7:25 AM    History, Medications, Surgery, SDOH, and Family History reviewed and updated as appropriate.

## 2022-06-13 NOTE — Telephone Encounter (Signed)
Pt wants 90 day supply of Famotidine 40 mg to [facility] on [address]

## 2022-06-14 ENCOUNTER — Encounter: Payer: Self-pay | Admitting: Obstetrics

## 2022-06-21 NOTE — Assessment & Plan Note (Signed)
Chronic sore throat with worsening symptoms present for the past week.  On examination the posterior oropharynx is mildly erythematous with no signs of drainage or exudate present.  She does also have epigastric tenderness.  She has no other upper respiratory findings and is currently afebrile.  Given her history of reflux I feel that she is currently having an exacerbation of GERD causing reflux pharyngitis.  Discussion of medication options with patient today.  Joint decision made to begin omeprazole 40 mg twice a day for the next 2 weeks and continue with once a day dosing for at least 6 additional weeks to allow for reduced acid production and time for healing in the esophagus.  If symptoms or not improved at all within the next 2 weeks recommend follow-up for further evaluation.  She has seen GI in the past and may need to make an appointment for further evaluation if this continues.

## 2022-06-23 ENCOUNTER — Encounter: Payer: Self-pay | Admitting: Nurse Practitioner

## 2022-06-23 ENCOUNTER — Encounter: Payer: Self-pay | Admitting: Orthopedic Surgery

## 2022-06-24 ENCOUNTER — Ambulatory Visit (INDEPENDENT_AMBULATORY_CARE_PROVIDER_SITE_OTHER): Payer: Medicaid Other | Admitting: Obstetrics and Gynecology

## 2022-06-24 ENCOUNTER — Encounter: Payer: Self-pay | Admitting: Obstetrics and Gynecology

## 2022-06-24 ENCOUNTER — Ambulatory Visit: Payer: Medicaid Other | Admitting: Obstetrics and Gynecology

## 2022-06-24 VITALS — BP 101/70 | HR 83 | Ht 59.0 in | Wt 159.8 lb

## 2022-06-24 DIAGNOSIS — R319 Hematuria, unspecified: Secondary | ICD-10-CM | POA: Diagnosis not present

## 2022-06-24 DIAGNOSIS — R350 Frequency of micturition: Secondary | ICD-10-CM

## 2022-06-24 DIAGNOSIS — M62838 Other muscle spasm: Secondary | ICD-10-CM

## 2022-06-24 LAB — POCT URINALYSIS DIPSTICK
Glucose, UA: NEGATIVE
Leukocytes, UA: NEGATIVE
Nitrite, UA: NEGATIVE
Protein, UA: POSITIVE — AB
Spec Grav, UA: >=1.03 — AB (ref 1.010–1.025)
Urobilinogen, UA: 4 E.U./dL — AB
pH, UA: 6 (ref 5.0–8.0)

## 2022-06-24 MED ORDER — DIAZEPAM 5 MG PO TABS: ORAL_TABLET | ORAL | 0 refills | Status: DC

## 2022-06-24 NOTE — Progress Notes (Unsigned)
[name]  Referring Provider: Shelly Bombard, MD PCP: [name], NP Date of Service: 06/24/2022  SUBJECTIVE Chief Complaint: New Patient (Initial Visit) (Lydia [name] is a 37 y.o. female is here for bleeding during/after intercourse. Vaginal pain during intercourse. Patient stated last month she felt as if something wwas going to come out of her vagina.)  History of Present Illness: Lydia [name] is a 37 y.o.  hispanic  female seen in consultation at the request of Dr. Jodi Mourning for evaluation of dysuria and pain with intercourse.    Review of records significant for: Having dysuria and pan in her back. Negative GC/C, BV, candida swab on 06/10/22.  TVUS on 05/28/22: Questionable 10 mm intramural leiomyoma posterior upper uterus. Trace nonspecific endometrial fluid. Remainder of exam unremarkable.  Urinary Symptoms: Does not leak urine.   Day time voids 8.  Nocturia: 2-3 times per night to void. Voiding dysfunction: she does not empty her bladder well.  does not use a catheter to empty bladder.  When urinating, she feels the need to urinate multiple times in a row Drinks: 3-4 16oz bottles water, juice, 1 can soda per day  UTIs: 1-2 UTI's in the last year.   Urine culture 05/21/22- <10,000 cfu 04/01/22- <10,000 cfu Denies history of blood in urine and kidney or bladder stones  Pelvic Organ Prolapse Symptoms:                  She Denies a feeling of a bulge the vaginal area.   Bowel Symptom: Bowel movements: every 3 days Stool consistency: soft  Straining: yes.  Splinting: no.  Incomplete evacuation: yes.   Denies accidental bowel leakage / fecal incontinence Bowel regimen:  montegrity Last colonoscopy: Date 2021- negative  Sexual Function Sexually active: yes.  Pain with sex: Yes, deep in the pelvis  Pelvic Pain Admits to pelvic pain Location: lower pelvis.  Has been going on for a few years.  Pain occurs:  out of nowhere or during sex. Feels like cramping like she is going to have her period.  Feels a lot of pressure as well.  Improved by: not having sex   Past Medical History:  Past Medical History:  Diagnosis Date   Abdominal pain, epigastric 01/26/2014   Acute recurrent maxillary sinusitis 03/11/2014   Allergy    Anxiety    Chronic constipation    Motegrity helps    Chronic headaches    none x 2-3 yrs   GERD (gastroesophageal reflux disease)    HEADACHE 11/30/2007   Qualifier: Diagnosis of  By: Grier Rocher MD, Madaline Guthrie    Infection    UTI   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/22/2020   Meningitis    Ovarian cyst    Pain 12/08/2013   Dyspareunia    Pelvic pain in female 07/06/2013   Right-sided back pain 09/28/2021   Strep throat 01/23/2022   Tinea versicolor 12/08/2013   Viral pharyngitis 04/02/2022     Past Surgical History:   Past Surgical History:  Procedure Laterality Date   CHOLECYSTECTOMY  2016   COLONOSCOPY     LAPAROSCOPIC APPENDECTOMY N/A 11/10/2020   Procedure: LAPAROSCOPIC APPENDECTOMY;  Surgeon: Mickeal Skinner, MD;  Location: [facility];  Service: General;  Laterality: N/A;   UPPER GASTROINTESTINAL ENDOSCOPY  2015   WISDOM TOOTH EXTRACTION       Past OB/GYN History: OB History  Gravida Para Term Preterm AB Living  4  3 3 0 1 3  SAB IAB Ectopic Multiple Live Births  1 0 0 0 3    # Outcome Date GA Lbr Len/2nd Weight Sex Delivery Anes PTL Lv  4 Term 08/03/15 63w5d20:25 / 00:40 8 lb 1.5 oz (3.671 kg) M Vag-Spont EPI  LIV  3 Term 12/30/10 350w2d0:50 / 00:29 6 lb 1.4 oz (2.761 kg) F Vag-Spont EPI  LIV     Birth Comments: left arm below elbow missing  2 Term 11/28/04 4135w0d lb 14 oz (3.118 kg) F Vag-Spont EPI  LIV  1 SAB             LMP 06/16/21 Contraception: OCPs. Last pap smear was 2023- negative.      Medications: She has a current medication list which includes the following prescription(s): diazepam, buspirone, vitamin d3, famotidine, ajovy,  hydroxyzine, ibuprofen, junel fe 1.5/30, motegrity, omeprazole, and nurtec.   Allergies: Patient is allergic to penicillins.   Social History:  Social History   Tobacco Use   Smoking status: Never   Smokeless tobacco: Never  Vaping Use   Vaping Use: Never used  Substance Use Topics   Alcohol use: Not Currently    Comment: occ   Drug use: No    Relationship status: married She lives with husband and kids.   She is not employed. Regular exercise: No History of abuse: No  Family History:   Family History  Problem Relation Age of Onset   Migraines Mother    Hypertension Mother    Asthma Father    Birth defects Daughter        Limb reduction defect   Breast cancer Maternal Aunt 42 7Colon cancer Neg Hx    Esophageal cancer Neg Hx    Rectal cancer Neg Hx    Stomach cancer Neg Hx    Colon polyps Neg Hx      Review of Systems: Review of Systems  Constitutional:  Negative for fever, malaise/fatigue and weight loss.  Respiratory:  Negative for cough, shortness of breath and wheezing.   Cardiovascular:  Negative for chest pain, palpitations and leg swelling.  Gastrointestinal:  Positive for abdominal pain. Negative for blood in stool.  Genitourinary:  Negative for dysuria.  Musculoskeletal:  Negative for myalgias.  Skin:  Negative for rash.  Neurological:  Positive for headaches. Negative for dizziness.  Endo/Heme/Allergies:  Does not bruise/bleed easily.  Psychiatric/Behavioral:  Negative for depression. The patient is nervous/anxious.      OBJECTIVE Physical Exam: Vitals:   06/24/22 1309  BP: 101/70  Pulse: 83  Weight: 159 lb 12.8 oz (72.5 kg)  Height: 4' 11"$  (1.499 m)    Physical Exam Constitutional:      General: She is not in acute distress. Pulmonary:     Effort: Pulmonary effort is normal.  Abdominal:     General: There is no distension.     Palpations: Abdomen is soft.     Tenderness: There is no abdominal tenderness. There is no rebound.   Musculoskeletal:        General: No swelling. Normal range of motion.  Skin:    General: Skin is warm and dry.     Findings: No rash.  Neurological:     Mental Status: She is alert and oriented to person, place, and time.  Psychiatric:        Mood and Affect: Mood normal.        Behavior: Behavior normal.     GU /  Detailed Urogynecologic Evaluation:  Pelvic Exam: Normal external female genitalia; Bartholin's and Skene's glands normal in appearance; urethral meatus normal in appearance, no urethral masses or discharge.   CST: negative  Straight cath specimen obtained due to presence of blood in urine but not enough to send for culture.   Speculum exam reveals normal vaginal mucosa without atrophy. Cervix  friable- blood present . Uterus normal single, nontender. Adnexa no mass, fullness, tenderness.     Pelvic floor strength I/V  Pelvic floor musculature: Right levator tender, Right obturator tender, Left levator tender, Left obturator tender. Palpation reproduces pain with intercourse.   POP-Q:   POP-Q  -2.5                                            Aa   -2.5                                           Ba  -6.5                                              C   4                                            Gh  4.5                                            Pb  10                                            tvl   -2.5                                            Ap  -2.5                                            Bp  -9.5                                              D      Rectal Exam:  Normal external rectum  Post-Void Residual (PVR) by Bladder Scan: In order to evaluate bladder emptying, we discussed obtaining a postvoid residual and she agreed to this procedure.  Procedure: The ultrasound unit was placed on the patient's abdomen in the suprapubic region after the patient had voided. A PVR of 10 ml was obtained by bladder scan.  Laboratory Results: POC  urine: moderate blood   ASSESSMENT AND PLAN Ms. Rearden is a 37 y.o. with:  1. Levator spasm   2. Hematuria, unspecified type   3. Urinary frequency    Levator spasm - The origin of pelvic floor muscle spasm can be multifactorial, including primary, reactive to a different pain source, trauma, or even part of a centralized pain syndrome.Treatment options include pelvic floor physical therapy, local (vaginal) or oral  muscle relaxants, pelvic muscle trigger point injections or centrally acting pain medications.   - prescribed valium 83m to use as needed. Recommended using nightly for a week then prn.  - Referral also placed to pelvic   2. Urinary frequency - advised to decrease soda intake.  -. We discussed the symptoms of overactive bladder (OAB), which include urinary urgency, urinary frequency, nocturia, with or without urge incontinence.  While we do not know the exact etiology of OAB, several treatment options exist. We discussed management including behavioral therapy (decreasing bladder irritants, urge suppression strategies, timed voids, bladder retraining), physical therapy, medication. - Not as bothersome as a symptom for her recently. She will start with pelvic PT.   3. Blood in urine - will send for UA micro and culture to confirm if there is any hematuria.   Return 2 months or sooner if needed  MJaquita Folds MD   Medical Decision Making:  - Reviewed/ ordered a clinical laboratory test - Review and summation of prior records

## 2022-06-24 NOTE — Patient Instructions (Addendum)
I will prescribe valium 5 mg pills to place vaginally up to 2 times a day for vaginal muscle spasms. Start at night and take one every night for the next week to see if it improves your symptoms. Once you are improving you can taper off the medication and just use as needed. If the medication makes you drowsy then only use at bedtime and/or we can reduce the dose. Do not use gel to place it, as that will prevent the tablet from dissolving.  Instead place 1-2 drops of water on the table before inserting it in the vagina. If the pills do not dissolve well we can switch to a special compounded suppository. Let me know how you are doing on the medication and if you have any questions.     A referral has been placed to pelvic physical therapy.

## 2022-06-25 ENCOUNTER — Encounter: Payer: Self-pay | Admitting: Obstetrics and Gynecology

## 2022-06-25 LAB — URINALYSIS, MICROSCOPIC ONLY: Casts: NONE SEEN /lpf

## 2022-06-25 NOTE — Telephone Encounter (Signed)
I think that she will need to come back 6 weeks out from her initial appointment with Dr. Marlou Sa.  At that point, we can see her and say that she has failed conservative management and get her set up for MRI scan.  I would encourage her to try home exercise program in the meantime so that we have this documented for insurance reasons

## 2022-06-26 ENCOUNTER — Other Ambulatory Visit (HOSPITAL_BASED_OUTPATIENT_CLINIC_OR_DEPARTMENT_OTHER): Payer: Self-pay | Admitting: Nurse Practitioner

## 2022-06-26 ENCOUNTER — Encounter: Payer: Self-pay | Admitting: Obstetrics and Gynecology

## 2022-06-26 DIAGNOSIS — F411 Generalized anxiety disorder: Secondary | ICD-10-CM

## 2022-06-26 LAB — URINE CULTURE: Organism ID, Bacteria: NO GROWTH

## 2022-06-26 NOTE — Telephone Encounter (Signed)
Refill request last apt 06/13/22.

## 2022-06-27 ENCOUNTER — Other Ambulatory Visit: Payer: Medicaid Other

## 2022-07-01 ENCOUNTER — Encounter: Payer: Self-pay | Admitting: *Deleted

## 2022-07-15 ENCOUNTER — Encounter: Payer: Self-pay | Admitting: Psychiatry

## 2022-07-15 ENCOUNTER — Telehealth (INDEPENDENT_AMBULATORY_CARE_PROVIDER_SITE_OTHER): Payer: Medicaid Other | Admitting: Nurse Practitioner

## 2022-07-15 ENCOUNTER — Encounter: Payer: Self-pay | Admitting: Nurse Practitioner

## 2022-07-15 VITALS — Ht 60.0 in | Wt 156.0 lb

## 2022-07-15 DIAGNOSIS — R42 Dizziness and giddiness: Secondary | ICD-10-CM

## 2022-07-15 DIAGNOSIS — R000 Tachycardia, unspecified: Secondary | ICD-10-CM

## 2022-07-15 DIAGNOSIS — J069 Acute upper respiratory infection, unspecified: Secondary | ICD-10-CM | POA: Insufficient documentation

## 2022-07-15 DIAGNOSIS — I959 Hypotension, unspecified: Secondary | ICD-10-CM

## 2022-07-15 MED ORDER — AZITHROMYCIN 250 MG PO TABS: ORAL_TABLET | ORAL | 0 refills | Status: AC

## 2022-07-15 NOTE — Assessment & Plan Note (Signed)
The patient reports experiencing dizziness, tunnel vision, and nausea that can persist for several hours. These symptoms may be attributable to fluctuations in blood pressure, dehydration, hypoglycemia, or atypical migraines- at this time etiology is not completely clear. The patient has a documented history of low blood pressure and is currently prescribed Ajovy for migraine prophylaxis, with no reports of breakthrough migraines. I feel cardiac and neurological rule out are appropriate.  Plan: - Instruct the patient to measure blood pressure immediately upon experiencing another episode. - Recommend the use of Nurtec to determine if it provides symptom relief in the even these are atypical migraines.  - Referral to a cardiologist to exclude cardiac etiologies. - Suggest contacting neurology for a comprehensive assessment. - Advise the patient to seek emergency medical attention if symptoms escalate in severity or frequency

## 2022-07-15 NOTE — Progress Notes (Signed)
Virtual Visit Encounter mychart visit.   I connected with  [name] on 07/15/22 at 10:15 AM EST by secure video and audio telemedicine application. I verified that I am speaking with the correct person using two identifiers.   I introduced myself as a Designer, jewellery with the practice. The limitations of evaluation and management by telemedicine discussed with the patient and the availability of in person appointments. The patient expressed verbal understanding and consent to proceed.  Participating parties in this visit include: Myself and patient  The patient is: Patient Location: Other:  car I am: Provider Location: Office/Clinic Subjective:    CC and HPI: Lydia [name] is a 37 y.o. year old female presenting for new evaluation and treatment of sore throat and episodes of dizziness.  Chamille presents today with a chief complaint of a sore throat that began last week and has become more severe over the weekend. She has not experienced fevers and reports no visible white patches in her throat. The patient recalls having strep throat previously in the fall and over the weekend she was concerned this had returned, but today the sore throat is better. She is now experiencing nasal congestion with both stuffy and runny nose symptoms that started this morning.   The patient has been experiencing episodes of tunnel vision, dizziness, and nausea that persist for a considerable amount of time. She initially experienced this when pregnant. At that time BP work-up was normal, but symptoms were resolved whenever BP was monitored/ She has a medical history of low blood pressure readings at times. She confirms that she is currently well-hydrated. The patient is uncertain whether these episodes are related to her heart, blood sugar levels, or migraines. Her current medication regimen includes Ajovy for migraine prevention, and she reports good control with this medication. She tells me each episode lasts  about 2-3 hours and she does experience tachycardia when this occurs. She has not had any syncope with this. All episodes have occurred while calm and resting.    Past medical history, Surgical history, Family history not pertinant except as noted below, Social history, Allergies, and medications have been entered into the medical record, reviewed, and corrections made.   Review of Systems:  All review of systems negative except what is listed in the HPI  Objective:    Alert and oriented x 4 Speaking in clear sentences with no shortness of breath. No distress.  Impression and Recommendations:    Problem List Items Addressed This Visit     Upper respiratory tract infection - Primary    The patient presents with a sore throat that began Lydia [name] last week and has intensified over the weekend. The patient has not experienced fevers and there are no visible white patches in the throat. There is a noted history of strep throat from the previous fall. Potential diagnoses being considered include viral infection, recurrent strep throat, bacterial URI, or COVID-19. She has been exposed to illness through her husband.  Plan: - Propose conducting a swab test to exclude COVID-19 or strep throat- at this time she declines - Contemplate the initiation of antibiotic therapy due to the persistent nature of symptoms, which may indicate a bacterial etiology.      Relevant Medications   azithromycin (ZITHROMAX) 250 MG tablet   Dizziness    The patient reports experiencing dizziness, tunnel vision, and nausea that can persist for several hours. These symptoms may be attributable to fluctuations in blood pressure, dehydration, hypoglycemia, or atypical  migraines- at this time etiology is not completely clear. The patient has a documented history of low blood pressure and is currently prescribed Ajovy for migraine prophylaxis, with no reports of breakthrough migraines. I feel cardiac and neurological rule out  are appropriate.  Plan: - Instruct the patient to measure blood pressure immediately upon experiencing another episode. - Recommend the use of Nurtec to determine if it provides symptom relief in the even these are atypical migraines.  - Referral to a cardiologist to exclude cardiac etiologies. - Suggest contacting neurology for a comprehensive assessment. - Advise the patient to seek emergency medical attention if symptoms escalate in severity or frequency      Relevant Orders   Ambulatory referral to Cardiology   Other Visit Diagnoses     Tachycardia       Relevant Orders   Ambulatory referral to Cardiology   Hypotension, unspecified hypotension type       Relevant Orders   Ambulatory referral to Cardiology       orders and follow up as documented in EMR I discussed the assessment and treatment plan with the patient. The patient was provided an opportunity to ask questions and all were answered. The patient agreed with the plan and demonstrated an understanding of the instructions.   The patient was advised to call back or seek an in-person evaluation if the symptoms worsen or if the condition fails to improve as anticipated.  Follow-Up: prn  I provided 22 minutes of non-face-to-face interaction with this non face-to-face encounter including intake, same-day documentation, and chart review.   [name], NP , DNP, AGNP-c [facility]

## 2022-07-15 NOTE — Assessment & Plan Note (Signed)
The patient presents with a sore throat that began Lydia [name] last week and has intensified over the weekend. The patient has not experienced fevers and there are no visible white patches in the throat. There is a noted history of strep throat from the previous fall. Potential diagnoses being considered include viral infection, recurrent strep throat, bacterial URI, or COVID-19. She has been exposed to illness through her husband.  Plan: - Propose conducting a swab test to exclude COVID-19 or strep throat- at this time she declines - Contemplate the initiation of antibiotic therapy due to the persistent nature of symptoms, which may indicate a bacterial etiology.

## 2022-07-17 ENCOUNTER — Ambulatory Visit (INDEPENDENT_AMBULATORY_CARE_PROVIDER_SITE_OTHER): Payer: Medicaid Other | Admitting: Orthopedic Surgery

## 2022-07-17 DIAGNOSIS — M545 Low back pain, unspecified: Secondary | ICD-10-CM | POA: Diagnosis not present

## 2022-07-18 ENCOUNTER — Encounter: Payer: Self-pay | Admitting: Nurse Practitioner

## 2022-07-20 ENCOUNTER — Encounter: Payer: Self-pay | Admitting: Orthopedic Surgery

## 2022-07-20 NOTE — Progress Notes (Signed)
Office Visit Note   Patient: Lydia [name]           Date of Birth: 03/27/1986           MRN: [id number] Visit Date: 07/17/2022 Requested by: [name], NP [address] [place],  [place] [address] PCP: [name], NP  Subjective: Chief Complaint  Patient presents with   Lower Back - Pain    HPI: Lydia [name] is a 37 y.o. female who presents to the office reporting Lydia [name] is a 37 year old patient with low back pain.  Pain has been present for many months.  She has been doing home exercise program of stretching and strengthening for the last 6 weeks without relief.  Describes pain with lifting and bending.  Reports really pain only in her back.  No pain with coughing and sneezing.  Symptoms ongoing for 6 months.  She has tried muscle relaxer and nonsteroidal with no help.  Symptoms are midline.  Wakes her from sleep at times occasionally..                ROS: All systems reviewed are negative as they relate to the chief complaint within the history of present illness.  Patient denies fevers or chills.  Assessment & Plan: Visit Diagnoses:  1. Low back pain, unspecified back pain laterality, unspecified chronicity, unspecified whether sciatica present     Plan: Impression is low back pain with failure of conservative management.  Patient has been under a physical therapy/home exercise program of conservative treatment without response.  MRI lumbar spine indicated to evaluate facet arthritis versus HNP.  Follow-up after the study  Follow-Up Instructions: No follow-ups on file.   Orders:  Orders Placed This Encounter  Procedures   MR Lumbar Spine w/o contrast   No orders of the defined types were placed in this encounter.     Procedures: No procedures performed   Clinical Data: No additional findings.  Objective: Vital Signs: There were no vitals taken for this visit.  Physical Exam:  Constitutional: Patient appears well-developed HEENT:  Head:  Normocephalic Eyes:EOM are normal Neck: Normal range of motion Cardiovascular: Normal rate Pulmonary/chest: Effort normal Neurologic: Patient is alert Skin: Skin is warm Psychiatric: Patient has normal mood and affect  Ortho Exam: Ortho exam demonstrates normal gait alignment.  Positive nerve root tension signs bilaterally.  Patient has intact ankle dorsiflexion plantarflexion strength.  No groin pain with internal/external Tatian of the leg.  No masses lymphadenopathy or skin changes noted in that lumbar spine region.  No paresthesias L1-S1 bilaterally.  Specialty Comments:  No specialty comments available.  Imaging: No results found.   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   Past Medical History:  Diagnosis Date   Abdominal pain, epigastric 01/26/2014   Acute recurrent maxillary sinusitis 03/11/2014   Allergy    Anxiety    Chronic constipation    Motegrity helps    Chronic headaches    none x 2-3 yrs   GERD (gastroesophageal reflux  disease)    HEADACHE 11/30/2007   Qualifier: Diagnosis of  By: Grier Rocher MD, Madaline Guthrie    Infection    UTI   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/22/2020   Meningitis    Ovarian cyst    Pain 12/08/2013   Dyspareunia    Pelvic pain in female 07/06/2013   Right-sided back pain 09/28/2021   Strep throat 01/23/2022   Tinea versicolor 12/08/2013   Viral pharyngitis 04/02/2022    Family History  Problem Relation Age of Onset   Migraines  Mother    Hypertension Mother    Asthma Father    Birth defects Daughter        Limb reduction defect   Breast cancer Maternal Aunt 42   Colon cancer Neg Hx    Esophageal cancer Neg Hx    Rectal cancer Neg Hx    Stomach cancer Neg Hx    Colon polyps Neg Hx     Past Surgical History:  Procedure Laterality Date   CHOLECYSTECTOMY  2016   COLONOSCOPY     LAPAROSCOPIC APPENDECTOMY N/A 11/10/2020   Procedure: LAPAROSCOPIC APPENDECTOMY;  Surgeon: Mickeal Skinner, MD;  Location: [facility];  Service: General;  Laterality: N/A;   UPPER GASTROINTESTINAL ENDOSCOPY  2015   WISDOM TOOTH EXTRACTION     Social History   Occupational History   Not on file  Tobacco Use   Smoking status: Never   Smokeless tobacco: Never  Vaping Use   Vaping Use: Never used  Substance and Sexual Activity   Alcohol use: Not Currently    Comment: occ   Drug use: No   Sexual activity: Yes    Partners: Male    Birth control/protection: Pill, OCP

## 2022-07-28 NOTE — Progress Notes (Unsigned)
Cardiology Office Note:   Date:  08/01/2022  NAME:  Lydia [name]    MRN: [id number] DOB:  10-07-1985   PCP:  [name], NP  Cardiologist:  None  Electrophysiologist:  None   Referring MD: [name], NP   Chief Complaint  Patient presents with   Tachycardia    History of Present Illness:   Lydia [name] is a 37 y.o. female with a hx of GERD who is being seen today for the evaluation of dizziness/tachycardia at the request of Early, Coralee Pesa, NP.  She reports for the past year she has had episodes of heart racing as well as dizziness.  They appear to occur around the noon hour.  She tells me that after eating she can notice her heart racing and feel dizzy for up to 15 minutes.  She describes no chest pain or trouble breathing.  She reports symptoms occur periodically.  They can go several weeks without happening but seem to be happening more frequently.  She may drink 36 ounces of water per day.  We discussed increasing her oral intake.  She does suffer from anxiety.  She tells me that this is controlled.  She has not had labs in quite a while.  We discussed checking her thyroid CBC and full chemistry panel.  Her EKG demonstrates sinus rhythm with possibly minor Q waves in the inferior leads.  Her CV examination is normal.  She has never had any cardiac testing.  She reports heart disease runs in her maternal grandmother.  She does not smoke.  No alcohol use is reported.  No drug use.  She is married.  She has 3 children.  Her oldest is 18 years.  She is on BuSpar, Valium and Nurtec to take for migraines.  She is also on Vistaril.  We discussed this could be a combination of medications reacting.  She seems to have been on this regimen for quite a while.  Symptoms continue to occur.  They are occurring daily at this point.  Past Medical History: Past Medical History:  Diagnosis Date   Abdominal pain, epigastric 01/26/2014   Acute recurrent maxillary sinusitis 03/11/2014   Allergy     Anxiety    Chronic constipation    Motegrity helps    Chronic headaches    none x 2-3 yrs   GERD (gastroesophageal reflux disease)    HEADACHE 11/30/2007   Qualifier: Diagnosis of  By: Grier Rocher MD, Madaline Guthrie    Infection    UTI   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/22/2020   Meningitis    Ovarian cyst    Pain 12/08/2013   Dyspareunia    Pelvic pain in female 07/06/2013   Right-sided back pain 09/28/2021   Strep throat 01/23/2022   Tinea versicolor 12/08/2013   Viral pharyngitis 04/02/2022    Past Surgical History: Past Surgical History:  Procedure Laterality Date   CHOLECYSTECTOMY  2016   COLONOSCOPY     LAPAROSCOPIC APPENDECTOMY N/A 11/10/2020   Procedure: LAPAROSCOPIC APPENDECTOMY;  Surgeon: Mickeal Skinner, MD;  Location: [facility];  Service: General;  Laterality: N/A;   UPPER GASTROINTESTINAL ENDOSCOPY  2015   WISDOM TOOTH EXTRACTION      Current Medications: Current Meds  Medication Sig   busPIRone (BUSPAR) 5 MG tablet TAKE 1 TABLET(5 MG) BY MOUTH DAILY. MAY ALSO TAKE 1 TABLET 5 MG TOTAL 2 2 TIMES DAILY AS NEEDED. FOR ANXIETY   Cholecalciferol (VITAMIN D3) 50 MCG (  2000 UT) capsule Take 1 capsule (2,000 Units total) by mouth daily.   diazepam (VALIUM) 5 MG tablet Place 1 tablet vaginally nightly as needed for muscle spasm/ pelvic pain.   famotidine (PEPCID) 40 MG tablet Take 1 tablet (40 mg total) by mouth at bedtime.   Fremanezumab-vfrm (AJOVY) 225 MG/1.5ML SOAJ Inject 1 Pen into the skin every 30 (thirty) days.   hydrOXYzine (VISTARIL) 50 MG capsule Take 1 capsule (50 mg total) by mouth at bedtime and may repeat dose one time if needed. For sleep and anxiety.   ibuprofen (ADVIL) 800 MG tablet TAKE 1 TABLET(800 MG) BY MOUTH EVERY 8 HOURS AS NEEDED   JUNEL FE 1.5/30 1.5-30 MG-MCG tablet Take 1 tablet by mouth daily.   MOTEGRITY 2 MG TABS TAKE 1 TABLET(2 MG) BY MOUTH DAILY   omeprazole (PRILOSEC) 40 MG capsule Take 1 capsule (40 mg total) by mouth daily. Take in the  morning and at dinnertime.   Rimegepant Sulfate (NURTEC) 75 MG TBDP Take 75 mg by mouth every other day. Take one pill at onset of migraine. Max dose 1 pill in 24 hours     Allergies:    Penicillins   Social History: Social History   Socioeconomic History   Marital status: Married    Spouse name: Not on file   Number of children: 3   Years of education: Not on file   Highest education level: 9th grade  Occupational History   Occupation: Stay at home mom  Tobacco Use   Smoking status: Never   Smokeless tobacco: Never  Vaping Use   Vaping Use: Never used  Substance and Sexual Activity   Alcohol use: Not Currently    Comment: occ   Drug use: No   Sexual activity: Yes    Partners: Male    Birth control/protection: Pill, OCP  Other Topics Concern   Not on file  Social History Narrative   Lives with family   Caffeine- none    Social Determinants of Health   Financial Resource Strain: Not on file  Food Insecurity: Not on file  Transportation Needs: Not on file  Physical Activity: Not on file  Stress: Not on file  Social Connections: Not on file     Family History: The patient's family history includes Asthma in her father; Birth defects in her daughter; Breast cancer (age of onset: 57) in her maternal aunt; Hypertension in her mother; Migraines in her mother. There is no history of Colon cancer, Esophageal cancer, Rectal cancer, Stomach cancer, or Colon polyps.  ROS:   All other ROS reviewed and negative. Pertinent positives noted in the HPI.     EKGs/Labs/Other Studies Reviewed:   The following studies were personally reviewed by me today:  EKG:  EKG is ordered today.  The ekg ordered today demonstrates normal sinus rhythm heart rate 74, inferior infarct, no acute ischemic changes, and was personally reviewed by me.   Recent Labs: 12/17/2021: Potassium 4.1   Recent Lipid Panel    Component Value Date/Time   CHOL 217 (H) 08/11/2019 1136   TRIG 154.0 (H)  08/11/2019 1136   HDL 49.20 08/11/2019 1136   CHOLHDL 4 08/11/2019 1136   VLDL 30.8 08/11/2019 1136   LDLCALC 137 (H) 08/11/2019 1136    Physical Exam:   VS:  BP 100/62   Pulse 75   Ht 5' (1.524 m)   Wt 159 lb 3.2 oz (72.2 kg)   SpO2 100%   BMI 31.09 kg/m  Wt Readings from Last 3 Encounters:  08/01/22 159 lb 3.2 oz (72.2 kg)  07/15/22 156 lb (70.8 kg)  06/24/22 159 lb 12.8 oz (72.5 kg)    General: Well nourished, well developed, in no acute distress Head: Atraumatic, normal size  Eyes: PEERLA, EOMI  Neck: Supple, no JVD Endocrine: No thryomegaly Cardiac: Normal S1, S2; RRR; no murmurs, rubs, or gallops Lungs: Clear to auscultation bilaterally, no wheezing, rhonchi or rales  Abd: Soft, nontender, no hepatomegaly  Ext: No edema, pulses 2+ Musculoskeletal: No deformities, BUE and BLE strength normal and equal Skin: Warm and dry, no rashes   Neuro: Alert and oriented to person, place, time, and situation, CNII-XII grossly intact, no focal deficits  Psych: Normal mood and affect   ASSESSMENT:   Lydia [name] is a 38 y.o. female who presents for the following: 1. Dizziness   2. Tachycardia   3. Palpitations     PLAN:   1. Dizziness 2. Tachycardia 3. Palpitations -Dizziness and episodes of palpitations.  EKG with likely benign Q waves in the inferior leads.  Symptoms could be related to dehydration versus polypharmacy.  She is on several anxiety reducing medications which could be contributing.  Symptoms could also represent arrhythmia.  I would like to check a full panel of labs including CBC, CMP, TSH.  We need to make sure her thyroid and anemia are not an issue here.  She will increase her water intake.  I would like to proceed with a 7-day ZIO monitor to exclude arrhythmia.  I would also like to obtain an echocardiogram to exclude any structural heart issue.  If the above testing is negative would recommend reevaluation of her angiolytic medications.  That could be  contributing.   Disposition: Return if symptoms worsen or fail to improve.  Medication Adjustments/Labs and Tests Ordered: Current medicines are reviewed at length with the patient today.  Concerns regarding medicines are outlined above.  Orders Placed This Encounter  Procedures   CBC   TSH   Comprehensive metabolic panel   LONG TERM MONITOR (3-14 DAYS)   EKG 12-Lead   ECHOCARDIOGRAM COMPLETE   No orders of the defined types were placed in this encounter.   Patient Instructions  Medication Instructions:  The current medical regimen is effective;  continue present plan and medications.  *If you need a refill on your cardiac medications before your next appointment, please call your pharmacy*   Lab Work: CBC, TSH, CMET today   If you have labs (blood work) drawn today and your tests are completely normal, you will receive your results only by: [place] (if you have MyChart) OR A paper copy in the mail If you have any lab test that is abnormal or we need to change your treatment, we will call you to review the results.   Testing/Procedures: Echocardiogram - Your physician has requested that you have an echocardiogram. Echocardiography is a painless test that uses sound waves to create images of your heart. It provides your doctor with information about the size and shape of your heart and how well your heart's chambers and valves are working. This procedure takes approximately one hour. There are no restrictions for this procedure.   ZIO XT- Long Term Monitor Instructions  Your physician has requested you wear a ZIO patch monitor for 7 days.  This is a single patch monitor. [employer] supplies one patch monitor per enrollment. Additional stickers are not available. Please do not apply patch if you will be  having a Nuclear Stress Test,  Echocardiogram, Cardiac CT, MRI, or Chest Xray during the period you would be wearing the  monitor. The patch cannot be worn during these  tests. You cannot remove and re-apply the  ZIO XT patch monitor.  Your ZIO patch monitor will be mailed 3 day USPS to your address on file. It may take 3-5 days  to receive your monitor after you have been enrolled.  Once you have received your monitor, please review the enclosed instructions. Your monitor  has already been registered assigning a specific monitor serial # to you.  Billing and Patient Assistance Program Information  We have supplied [employer] with any of your insurance information on file for billing purposes. [employer] offers a sliding scale Patient Assistance Program for patients that do not have  insurance, or whose insurance does not completely cover the cost of the ZIO monitor.  You must apply for the Patient Assistance Program to qualify for this discounted rate.  To apply, please call [employer] at [phone], select option 4, select option 2, ask to apply for  Patient Assistance Program. Theodore Demark will ask your household income, and how many people  are in your household. They will quote your out-of-pocket cost based on that information.  [employer] will also be able to set up a 61-month, interest-free payment plan if needed.  Applying the monitor   Shave hair from upper left chest.  Hold abrader disc by orange tab. Rub abrader in 40 strokes over the upper left chest as  indicated in your monitor instructions.  Clean area with 4 enclosed alcohol pads. Let dry.  Apply patch as indicated in monitor instructions. Patch will be placed under collarbone on left  side of chest with arrow pointing upward.  Rub patch adhesive wings for 2 minutes. Remove white label marked "1". Remove the white  label marked "2". Rub patch adhesive wings for 2 additional minutes.  While looking in a mirror, press and release button in center of patch. A small green light will  flash 3-4 times. This will be your only indicator that the monitor has been turned on.  Do not shower for the first 24  hours. You may shower after the first 24 hours.  Press the button if you feel a symptom. You will hear a small click. Record Date, Time and  Symptom in the Patient Logbook.  When you are ready to remove the patch, follow instructions on the last 2 pages of Patient  Logbook. Stick patch monitor onto the last page of Patient Logbook.  Place Patient Logbook in the blue and white box. Use locking tab on box and tape box closed  securely. The blue and white box has prepaid postage on it. Please place it in the mailbox as  soon as possible. Your physician should have your test results approximately 7 days after the  monitor has been mailed back to [facility].  Call [place] at [phone] if you have questions regarding  your ZIO XT patch monitor. Call them immediately if you see an orange light blinking on your  monitor.  If your monitor falls off in less than 4 days, contact our Monitor department at [phone].  If your monitor becomes loose or falls off after 4 days call [employer] at [phone] for  suggestions on securing your monitor    Follow-Up: At [facility], you and your health needs are our priority.  As part of our continuing mission to provide you with  exceptional heart care, we have created designated Provider Care Teams.  These Care Teams include your primary Cardiologist (physician) and Advanced Practice Providers (APPs -  Physician Assistants and Nurse Practitioners) who all work together to provide you with the care you need, when you need it.  We recommend signing up for the patient portal called "MyChart".  Sign up information is provided on this After Visit Summary.  MyChart is used to connect with patients for Virtual Visits (Telemedicine).  Patients are able to view lab/test results, encounter notes, upcoming appointments, etc.  Non-urgent messages can be sent to your provider as well.   To learn more about what you can do with MyChart,  go to [email or web address].    Your next appointment:   As needed  Provider:   Eleonore Chiquito, MD      Signed, Addison Naegeli. Audie Box, MD, [place]  [address] [place], [place] [address] [phone]  08/01/2022 11:22 AM

## 2022-07-30 ENCOUNTER — Ambulatory Visit: Payer: Medicaid Other | Admitting: Physician Assistant

## 2022-08-01 ENCOUNTER — Ambulatory Visit (INDEPENDENT_AMBULATORY_CARE_PROVIDER_SITE_OTHER): Payer: Medicaid Other

## 2022-08-01 ENCOUNTER — Ambulatory Visit
Admission: RE | Admit: 2022-08-01 | Discharge: 2022-08-01 | Disposition: A | Discharge status: Home / Self Care | Payer: Medicaid Other | Source: Ambulatory Visit | Attending: Orthopedic Surgery | Admitting: Orthopedic Surgery

## 2022-08-01 ENCOUNTER — Ambulatory Visit: Payer: Medicaid Other | Attending: Cardiovascular Disease | Admitting: Cardiovascular Disease

## 2022-08-01 ENCOUNTER — Encounter: Payer: Self-pay | Admitting: Cardiovascular Disease

## 2022-08-01 VITALS — BP 100/62 | HR 75 | Ht 60.0 in | Wt 159.2 lb

## 2022-08-01 DIAGNOSIS — R42 Dizziness and giddiness: Secondary | ICD-10-CM | POA: Diagnosis not present

## 2022-08-01 DIAGNOSIS — R000 Tachycardia, unspecified: Secondary | ICD-10-CM

## 2022-08-01 DIAGNOSIS — R002 Palpitations: Secondary | ICD-10-CM | POA: Diagnosis not present

## 2022-08-01 DIAGNOSIS — M545 Low back pain, unspecified: Secondary | ICD-10-CM

## 2022-08-01 NOTE — Progress Notes (Unsigned)
Enrolled patient for a 7 day Zio XT monitor to be mailed to patients home.  

## 2022-08-01 NOTE — Patient Instructions (Signed)
Medication Instructions:  The current medical regimen is effective;  continue present plan and medications.  *If you need a refill on your cardiac medications before your next appointment, please call your pharmacy*   Lab Work: CBC, TSH, CMET today   If you have labs (blood work) drawn today and your tests are completely normal, you will receive your results only by: [place] (if you have MyChart) OR A paper copy in the mail If you have any lab test that is abnormal or we need to change your treatment, we will call you to review the results.   Testing/Procedures: Echocardiogram - Your physician has requested that you have an echocardiogram. Echocardiography is a painless test that uses sound waves to create images of your heart. It provides your doctor with information about the size and shape of your heart and how well your heart's chambers and valves are working. This procedure takes approximately one hour. There are no restrictions for this procedure.   ZIO XT- Long Term Monitor Instructions  Your physician has requested you wear a ZIO patch monitor for 7 days.  This is a single patch monitor. [employer] supplies one patch monitor per enrollment. Additional stickers are not available. Please do not apply patch if you will be having a Nuclear Stress Test,  Echocardiogram, Cardiac CT, MRI, or Chest Xray during the period you would be wearing the  monitor. The patch cannot be worn during these tests. You cannot remove and re-apply the  ZIO XT patch monitor.  Your ZIO patch monitor will be mailed 3 day USPS to your address on file. It may take 3-5 days  to receive your monitor after you have been enrolled.  Once you have received your monitor, please review the enclosed instructions. Your monitor  has already been registered assigning a specific monitor serial # to you.  Billing and Patient Assistance Program Information  We have supplied [employer] with any of your insurance  information on file for billing purposes. [employer] offers a sliding scale Patient Assistance Program for patients that do not have  insurance, or whose insurance does not completely cover the cost of the ZIO monitor.  You must apply for the Patient Assistance Program to qualify for this discounted rate.  To apply, please call [employer] at [phone], select option 4, select option 2, ask to apply for  Patient Assistance Program. Theodore Demark will ask your household income, and how many people  are in your household. They will quote your out-of-pocket cost based on that information.  [employer] will also be able to set up a 23-month, interest-free payment plan if needed.  Applying the monitor   Shave hair from upper left chest.  Hold abrader disc by orange tab. Rub abrader in 40 strokes over the upper left chest as  indicated in your monitor instructions.  Clean area with 4 enclosed alcohol pads. Let dry.  Apply patch as indicated in monitor instructions. Patch will be placed under collarbone on left  side of chest with arrow pointing upward.  Rub patch adhesive wings for 2 minutes. Remove white label marked "1". Remove the white  label marked "2". Rub patch adhesive wings for 2 additional minutes.  While looking in a mirror, press and release button in center of patch. A small green light will  flash 3-4 times. This will be your only indicator that the monitor has been turned on.  Do not shower for the first 24 hours. You may shower after the first 24 hours.  Press the button if you feel a symptom. You will hear a small click. Record Date, Time and  Symptom in the Patient Logbook.  When you are ready to remove the patch, follow instructions on the last 2 pages of Patient  Logbook. Stick patch monitor onto the last page of Patient Logbook.  Place Patient Logbook in the blue and white box. Use locking tab on box and tape box closed  securely. The blue and white box has prepaid postage on it. Please  place it in the mailbox as  soon as possible. Your physician should have your test results approximately 7 days after the  monitor has been mailed back to [facility].  Call [place] at [phone] if you have questions regarding  your ZIO XT patch monitor. Call them immediately if you see an orange light blinking on your  monitor.  If your monitor falls off in less than 4 days, contact our Monitor department at [phone].  If your monitor becomes loose or falls off after 4 days call [employer] at [phone] for  suggestions on securing your monitor    Follow-Up: At [facility], you and your health needs are our priority.  As part of our continuing mission to provide you with exceptional heart care, we have created designated Provider Care Teams.  These Care Teams include your primary Cardiologist (physician) and Advanced Practice Providers (APPs -  Physician Assistants and Nurse Practitioners) who all work together to provide you with the care you need, when you need it.  We recommend signing up for the patient portal called "MyChart".  Sign up information is provided on this After Visit Summary.  MyChart is used to connect with patients for Virtual Visits (Telemedicine).  Patients are able to view lab/test results, encounter notes, upcoming appointments, etc.  Non-urgent messages can be sent to your provider as well.   To learn more about what you can do with MyChart, go to [email or web address].    Your next appointment:   As needed  Provider:   Eleonore Chiquito, MD

## 2022-08-02 LAB — COMPREHENSIVE METABOLIC PANEL
ALT: 8 IU/L (ref 0–32)
AST: 12 IU/L (ref 0–40)
Albumin/Globulin Ratio: 1.8 (ref 1.2–2.2)
Albumin: 4.4 g/dL (ref 3.9–4.9)
Alkaline Phosphatase: 46 IU/L (ref 44–121)
BUN/Creatinine Ratio: 12 (ref 9–23)
BUN: 7 mg/dL (ref 6–20)
Bilirubin Total: 0.3 mg/dL (ref 0.0–1.2)
CO2: 21 mmol/L (ref 20–29)
Calcium: 9.9 mg/dL (ref 8.7–10.2)
Chloride: 104 mmol/L (ref 96–106)
Creatinine, Ser: 0.6 mg/dL (ref 0.57–1.00)
Globulin, Total: 2.5 g/dL (ref 1.5–4.5)
Glucose: 75 mg/dL (ref 70–99)
Potassium: 4.8 mmol/L (ref 3.5–5.2)
Sodium: 141 mmol/L (ref 134–144)
Total Protein: 6.9 g/dL (ref 6.0–8.5)
eGFR: 119 mL/min/{1.73_m2} (ref >59)

## 2022-08-02 LAB — TSH: TSH: 1.31 u[IU]/mL (ref 0.450–4.500)

## 2022-08-02 LAB — CBC
Hematocrit: 41 % (ref 34.0–46.6)
Hemoglobin: 13.6 g/dL (ref 11.1–15.9)
MCH: 29.7 pg (ref 26.6–33.0)
MCHC: 33.2 g/dL (ref 31.5–35.7)
MCV: 90 fL (ref 79–97)
Platelets: 442 10*3/uL (ref 150–450)
RBC: 4.58 x10E6/uL (ref 3.77–5.28)
RDW: 12.2 % (ref 11.7–15.4)
WBC: 11.2 10*3/uL — ABNORMAL HIGH (ref 3.4–10.8)

## 2022-08-05 DIAGNOSIS — R002 Palpitations: Secondary | ICD-10-CM | POA: Diagnosis not present

## 2022-08-05 DIAGNOSIS — R000 Tachycardia, unspecified: Secondary | ICD-10-CM

## 2022-08-07 ENCOUNTER — Encounter: Payer: Self-pay | Admitting: Cardiovascular Disease

## 2022-08-16 ENCOUNTER — Ambulatory Visit (INDEPENDENT_AMBULATORY_CARE_PROVIDER_SITE_OTHER): Payer: Medicaid Other | Admitting: Orthopedic Surgery

## 2022-08-16 DIAGNOSIS — M545 Low back pain, unspecified: Secondary | ICD-10-CM | POA: Diagnosis not present

## 2022-08-17 ENCOUNTER — Encounter: Payer: Self-pay | Admitting: Orthopedic Surgery

## 2022-08-17 MED ORDER — MELOXICAM 15 MG PO TABS: 15.0000 mg | ORAL_TABLET | Freq: Every day | ORAL | 1 refills | Status: DC | PRN

## 2022-08-17 NOTE — Progress Notes (Signed)
Office Visit Note   Patient: Lydia [name]           Date of Birth: 10-23-1985           MRN: [id number] Visit Date: 08/16/2022 Requested by: Tollie Eth, NP [address] [place],  [place] [address] PCP: Tollie Eth, NP  Subjective: Chief Complaint  Patient presents with   Other     Scan review    HPI: Lydia [name] is a 37 y.o. female who presents to the office reporting low back pain.  Ibuprofen has not been helping much.  She is not taking too much in terms of medication currently.  MRI scan shows L5-S1 very small disc.  Heat patch has become less helpful..  She describes primarily back pain with no leg symptoms.              ROS: All systems reviewed are negative as they relate to the chief complaint within the history of present illness.  Patient denies fevers or chills.  Assessment & Plan: Visit Diagnoses:  1. Low back pain, unspecified back pain laterality, unspecified chronicity, unspecified whether sciatica present     Plan: MRI scan is reviewed with the patient.  Basically shows small disc at L5-S1 with mild bilateral facet arthropathy.  Nothing really looks to structurally damage.  Epidural steroid injection may be helpful but she wants to hold off on that for now.  In general there is no major structural problem in the back.  Slight disc desiccation at L5-S1 but otherwise intact.  We talked about physical therapy for core strengthening and hamstring stretching and she was to hold off on that.  Follow-up as needed.  Follow-Up Instructions: No follow-ups on file.   Orders:  No orders of the defined types were placed in this encounter.  No orders of the defined types were placed in this encounter.     Procedures: No procedures performed   Clinical Data: No additional findings.  Objective: Vital Signs: There were no vitals taken for this visit.  Physical Exam:  Constitutional: Patient appears well-developed HEENT:  Head: Normocephalic Eyes:EOM  are normal Neck: Normal range of motion Cardiovascular: Normal rate Pulmonary/chest: Effort normal Neurologic: Patient is alert Skin: Skin is warm Psychiatric: Patient has normal mood and affect  Ortho Exam: Ortho exam demonstrates normal gait and alignment with no nerve root tension signs.  No definite numbness or tingling in the legs.  No atrophy.  Patient has slight discomfort going from the sitting to standing position.  Otherwise gait is normal.  Specialty Comments:  No specialty comments available.  Imaging: No results found.   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   Past Medical History:  Diagnosis Date   Abdominal pain, epigastric 01/26/2014   Acute recurrent maxillary sinusitis 03/11/2014   Allergy    Anxiety    Chronic constipation    Motegrity helps    Chronic headaches    none x 2-3 yrs   GERD (gastroesophageal reflux disease)  HEADACHE 11/30/2007   Qualifier: Diagnosis of  By: Aleene Davidson MD, Merrily Pew    Infection    UTI   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/22/2020   Meningitis    Ovarian cyst    Pain 12/08/2013   Dyspareunia    Pelvic pain in female 07/06/2013   Right-sided back pain 09/28/2021   Strep throat 01/23/2022   Tinea versicolor 12/08/2013   Viral pharyngitis 04/02/2022    Family History  Problem Relation Age of Onset   Migraines Mother    Hypertension Mother    Asthma Father    Birth defects Daughter        Limb reduction defect    Breast cancer Maternal Aunt 42   Colon cancer Neg Hx    Esophageal cancer Neg Hx    Rectal cancer Neg Hx    Stomach cancer Neg Hx    Colon polyps Neg Hx     Past Surgical History:  Procedure Laterality Date   CHOLECYSTECTOMY  2016   COLONOSCOPY     LAPAROSCOPIC APPENDECTOMY N/A 11/10/2020   Procedure: LAPAROSCOPIC APPENDECTOMY;  Surgeon: Rodman Pickle, MD;  Location: [facility];  Service: General;  Laterality: N/A;   UPPER GASTROINTESTINAL ENDOSCOPY  2015   WISDOM TOOTH EXTRACTION     Social History   Occupational History   Occupation: Stay at home mom  Tobacco Use   Smoking status: Never   Smokeless tobacco: Never  Vaping Use   Vaping Use: Never used  Substance and Sexual Activity   Alcohol use: Not Currently    Comment: occ   Drug use: No   Sexual activity: Yes    Partners: Male    Birth control/protection: Pill, OCP

## 2022-08-17 NOTE — Addendum Note (Signed)
Addended by: Rise Paganini on: 08/17/2022 01:28 PM   Modules accepted: Orders

## 2022-08-26 ENCOUNTER — Encounter: Payer: Self-pay | Admitting: Psychiatry

## 2022-08-27 ENCOUNTER — Other Ambulatory Visit: Payer: Self-pay | Admitting: *Deleted

## 2022-08-27 ENCOUNTER — Other Ambulatory Visit: Payer: Self-pay

## 2022-08-27 ENCOUNTER — Telehealth: Payer: Self-pay | Admitting: *Deleted

## 2022-08-27 MED ORDER — AJOVY 225 MG/1.5ML ~~LOC~~ SOAJ: 1.0000 | SUBCUTANEOUS | 4 refills | Status: DC

## 2022-08-27 NOTE — Telephone Encounter (Signed)
Pt states pharmacy told her PA is needed for Ajovy 225 mg pen.    Please route replies back to POD 1

## 2022-09-02 ENCOUNTER — Telehealth (INDEPENDENT_AMBULATORY_CARE_PROVIDER_SITE_OTHER): Payer: Medicaid Other | Admitting: Nurse Practitioner

## 2022-09-02 DIAGNOSIS — F411 Generalized anxiety disorder: Secondary | ICD-10-CM | POA: Diagnosis not present

## 2022-09-02 MED ORDER — BUSPIRONE HCL 10 MG PO TABS: ORAL_TABLET | ORAL | 6 refills | Status: DC

## 2022-09-02 NOTE — Progress Notes (Signed)
Virtual Visit Encounter audio visit.   I connected with  Lydia [name] on 09/02/22 at 12:00 PM EDT by secure audio telemedicine application. I verified that I am speaking with the correct person using two identifiers.   I introduced myself as a Publishing rights manager with the practice. The limitations of evaluation and management by telemedicine discussed with the patient and the availability of in person appointments. The patient expressed verbal understanding and consent to proceed.  Participating parties in this visit include: Myself and patient  The patient is: Patient Location: Home I am: Provider Location: Office/Clinic Subjective:    CC and HPI: Lydia [name] is a 37 y.o. year old female presenting for follow up of blood pressure. Patient reports the following:  [name] tells me that her blood pressure has been doing much better. She has been monitoring this at home and it has consistently be in the 110's/80's. She has completed her evaluation with cardiology and they had her wear a Zio patch for several days.   She tells me while she had this on she had no episodes of palpitations, but once this was removed the palpitations, dizziness, and nausea returned. She also experiences similar symptoms when she is in groups or crowds, such as church. These symptoms are not present at other times.   Past medical history, Surgical history, Family history not pertinant except as noted below, Social history, Allergies, and medications have been entered into the medical record, reviewed, and corrections made.   Review of Systems:  All review of systems negative except what is listed in the HPI  Objective:    Alert and oriented x 4 Speaking in clear sentences with no shortness of breath. No distress.  Impression and Recommendations:   Repeat evaluation of blood pressure shows normal levels outside of the office. No further assessment recommended.    Problem List Items Addressed This Visit      Generalized anxiety disorder    Ailany reports her anxiety is not completely controlled with buspar . She is taking the medication once daily. Plan: - Increase buspar to  once a day - Consider use of buspar 5-10mg  as needed up to 2 more times per day for anxiety symptoms.       Relevant Medications   busPIRone (BUSPAR) 10 MG tablet    current treatment plan changed today.  I discussed the assessment and treatment plan with the patient. The patient was provided an opportunity to ask questions and all were answered. The patient agreed with the plan and demonstrated an understanding of the instructions.   The patient was advised to call back or seek an in-person evaluation if the symptoms worsen or if the condition fails to improve as anticipated.  Follow-Up: prn  I provided 16 minutes of non-face-to-face interaction with this non face-to-face encounter including intake, same-day documentation, and chart review.   Tollie Eth, NP , DNP, AGNP-c [facility]

## 2022-09-02 NOTE — Assessment & Plan Note (Signed)
Lydia [name] reports her anxiety is not completely controlled with buspar . She is taking the medication once daily. Plan: - Increase buspar to  once a day - Consider use of buspar 5-10mg  as needed up to 2 more times per day for anxiety symptoms.

## 2022-09-03 ENCOUNTER — Ambulatory Visit: Payer: Medicaid Other | Admitting: Obstetrics and Gynecology

## 2022-09-04 ENCOUNTER — Ambulatory Visit (HOSPITAL_COMMUNITY): Payer: Medicaid Other | Attending: Cardiovascular Disease

## 2022-09-04 DIAGNOSIS — I361 Nonrheumatic tricuspid (valve) insufficiency: Secondary | ICD-10-CM

## 2022-09-04 DIAGNOSIS — R42 Dizziness and giddiness: Secondary | ICD-10-CM | POA: Diagnosis not present

## 2022-09-04 LAB — ECHOCARDIOGRAM COMPLETE
Area-P 1/2: 3.17 cm2
S' Lateral: 2.9 cm

## 2022-09-05 NOTE — Telephone Encounter (Signed)
I do not see any updates from PA team on Ajovy PA. I submitted urgent PA Ajovy on covermymeds. Key: [id number]. Waiting on determination from [employer].

## 2022-09-05 NOTE — Telephone Encounter (Signed)
Pt sent another mychart asking for update on PA.  I do not see any updates from PA team on Ajovy PA. I submitted urgent PA Ajovy on covermymeds. Key: [id number]. Waiting on determination from [employer].

## 2022-09-08 ENCOUNTER — Other Ambulatory Visit: Payer: Self-pay | Admitting: Internal Medicine

## 2022-09-09 ENCOUNTER — Ambulatory Visit: Payer: Medicaid Other | Admitting: Obstetrics and Gynecology

## 2022-09-09 NOTE — Progress Notes (Deleted)
[facility] Return Visit  SUBJECTIVE  History of Present Illness: Lydia [name] is a 37 y.o. female seen in follow-up for levator spasm and OAB. Plan at last visit was start pelvic floor PT and start vaginal valium as needed.     Past Medical History: Patient  has a past medical history of Abdominal pain, epigastric (01/26/2014), Acute recurrent maxillary sinusitis (03/11/2014), Allergy, Anxiety, Chronic constipation, Chronic headaches, GERD (gastroesophageal reflux disease), HEADACHE (11/30/2007), Ileitis, Infection, Mass of appendix (12/22/2020), Meningitis, Ovarian cyst, Pain (12/08/2013), Pelvic pain in female (07/06/2013), Right-sided back pain (09/28/2021), Strep throat (01/23/2022), Tinea versicolor (12/08/2013), and Viral pharyngitis (04/02/2022).   Past Surgical History: She  has a past surgical history that includes Cholecystectomy (2016); Upper gastrointestinal endoscopy (2015); Wisdom tooth extraction; Colonoscopy; and laparoscopic appendectomy (N/A, 11/10/2020).   Medications: She has a current medication list which includes the following prescription(s): buspirone, vitamin d3, diazepam, famotidine, ajovy, hydroxyzine, ibuprofen, junel fe 1.5/30, meloxicam, omeprazole, motegrity, and nurtec.   Allergies: Patient is allergic to penicillins.   Social History: Patient  reports that she has never smoked. She has never used smokeless tobacco. She reports that she does not currently use alcohol. She reports that she does not use drugs.      OBJECTIVE     Physical Exam: There were no vitals filed for this visit. Gen: No apparent distress, A&O x 3.  Detailed Urogynecologic Evaluation:  Deferred. Prior exam showed:      No data to display             ASSESSMENT AND PLAN    Lydia [name] is a 37 y.o. with:  No diagnosis found.

## 2022-09-10 ENCOUNTER — Encounter: Payer: Self-pay | Admitting: Physician Assistant

## 2022-09-10 ENCOUNTER — Ambulatory Visit (INDEPENDENT_AMBULATORY_CARE_PROVIDER_SITE_OTHER): Payer: Medicaid Other | Admitting: Physician Assistant

## 2022-09-10 VITALS — BP 106/76 | HR 74 | Ht 60.0 in | Wt 160.0 lb

## 2022-09-10 DIAGNOSIS — R110 Nausea: Secondary | ICD-10-CM

## 2022-09-10 DIAGNOSIS — K219 Gastro-esophageal reflux disease without esophagitis: Secondary | ICD-10-CM

## 2022-09-10 DIAGNOSIS — R1013 Epigastric pain: Secondary | ICD-10-CM | POA: Diagnosis not present

## 2022-09-10 DIAGNOSIS — R140 Abdominal distension (gaseous): Secondary | ICD-10-CM

## 2022-09-10 MED ORDER — OMEPRAZOLE 40 MG PO CPDR: 40.0000 mg | DELAYED_RELEASE_CAPSULE | Freq: Two times a day (BID) | ORAL | 5 refills | Status: DC

## 2022-09-10 NOTE — Progress Notes (Signed)
Addendum: Reviewed and agree with assessment and management plan. If negative may benefit from TCA or SNRI with titration Timara Loma, Carie Caddy, MD

## 2022-09-10 NOTE — Patient Instructions (Signed)
We have sent the following medications to your pharmacy for you to pick up at your convenience: Increase Omeprazole 40 mg twice daily 30-60 minutes before breakfast and dinner.  Your provider has ordered "Diatherix" stool testing for you. You have received a kit from our office today containing all necessary supplies to complete this test. Please carefully read the stool collection instructions provided in the kit before opening the accompanying materials. In addition, be sure to place the label from the top left corner of the laboratory request sheet onto the "puritan opti-swab" tube that is supplied in the kit. This label should include your full name and date of birth. After completing the test, you should secure the purtian tube into the specimen biohazard bag. The laboratory request information sheet (including date and time of specimen collection) should be placed into the outside pocket of the specimen biohazard bag and returned to the [facility] lab with 2 days of collection.   _______________________________________________________  If your blood pressure at your visit was 140/90 or greater, please contact your primary care physician to follow up on this.  _______________________________________________________  If you are age 36 or older, your body mass index should be between 23-30. Your Body mass index is 31.25 kg/m. If this is out of the aforementioned range listed, please consider follow up with your Primary Care Provider.  If you are age 36 or younger, your body mass index should be between 19-25. Your Body mass index is 31.25 kg/m. If this is out of the aformentioned range listed, please consider follow up with your Primary Care Provider.   ________________________________________________________  The [facility] providers would like to encourage you to use [facility] to communicate with providers for non-urgent requests or questions.  Due to long hold times on the telephone, sending your  provider a message by [facility] may be a faster and more efficient way to get a response.  Please allow 48 business hours for a response.  Please remember that this is for non-urgent requests.  _______________________________________________________

## 2022-09-10 NOTE — Progress Notes (Signed)
Chief Complaint: Abdominal pain and bloating  HPI:    Lydia [name] is a 37 year old female with a past medical history as listed below, known to Dr. Rhea Belton, who returns to clinic today for a complaint of abdominal pain.    01/05/2019 patient saw Dr. Rhea Belton and described bloating and epigastric discomfort.  She was on Pantoprazole twice a day and it was not helping.  It was recommended she have an EGD.  She was also continued on Linzess 145 mcg 2 days on and off 1 day.  For chronic constipation.    03/12/2019 EGD with mild reflux esophagitis.  Pantoprazole stopped.  Prescribed Dexilant 60 mg daily.  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      05/31/2019 patient was seen in clinic by me for follow-up of dyspepsia and epigastric pain.  At that time was doing well, no further epigastric discomfort.  Very occasionally she would have a little bit of stomach upset but only lasted for short time.  She was happy with her current medications including Dexilant 60 mg daily and amitriptyline 50 mg nightly as well as every other day Linzess.  She was continued on Linzess 145 mcg 1 tablet 2 days in a row then skip a day as well as Dexilant 60 mg daily and amitriptyline 50 mg nightly.    03/24/2020 colonoscopy Dr.Pyrtle with ileitis, rule out Crohn's disease, capacious colon, particularly in the left colon likely in part explain constipation (versus result of).  Otherwise normal.  Repeat recommended at 46.  Biopsies from her small bowel showed active inflammation but no chronic inflammation, this was nonspecific but did not appear to be classic for Crohn's disease.  It was thought the possibly NSAIDs caused this.  Recommend she avoid these.    08/01/2020 patient described chronic right upper quadrant pain.  Constipation is controlled on Motegrity.  At that time ordered labs including CBC, CMP and lipase also an abdominal ultrasound with focus on the right upper quadrant and prescribed Zofran as  needed.  Discussed repeating endoscopy if nausea and pain continued for CT scan.  Did discuss this may be scar tissue which pulls at times.    08/09/2020 abdominal ultrasound hepatomegaly and otherwise normal.    09/18/2020 EGD was normal.    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  She was referred to [facility].    01/23/2021 CT the abdomen pelvis with contrast showed probable small left ovarian cyst and resection of the mesenteric lesion.    08/28/2021 patient seen in clinic by me and described chronic right-sided abdominal pain.  Described as stabbing.  Sometimes associated with nausea.  That time discussed full evaluation by her team previously with EGD, colonoscopy, ultrasound, CT and MR enterography discussed that this could be musculoskeletal.  She was referred to Terrilee Files.    09/28/2021 patient seen by Terrilee Files.  Patient had an osteopathic manipulation.    05/21/2022 patient seen by PCP for urinary frequency, burning and back pain.  She was started on antibiotic.    05/28/2022 pelvic ultrasound with questionable 10 mm intramural leiomyoma posterior upper uterus and trace nonspecific endometrial fluid.    08/01/2022 MRI lumbar spine without contrast showed small disc bulge and mild bilateral facet arthropathy at L5-S1 without spinal canal stenosis.    Today, the patient tells me that over the past 2 to 3 months she has  had some epigastric pain and bloating with nausea.  Tells me when he gets bloated he gets hard and uncomfortable.  The only new medicine for her is Nurtec for her migraines.  She does continue on Omeprazole 40 mg once daily.  Tells me she really is not having any reflux is just this pain and bloating.  It can happen pretty much anytime of the day before eating or after eating or when she is eating.  The only thing that seems to help it go away is if she lays down on her  stomach sometimes for up to an hour and then it goes away by itself.  She has not tried any over-the-counter medications.    Does continue on Motegrity once daily which is keeping her with normal bowel movements.    Denies fever, chills, weight loss, blood in her stool, vomiting or symptoms that awaken her from sleep.  Past Medical History:  Diagnosis Date   Abdominal pain, epigastric 01/26/2014   Acute recurrent maxillary sinusitis 03/11/2014   Allergy    Anxiety    Chronic constipation    Motegrity helps    Chronic headaches    none x 2-3 yrs   GERD (gastroesophageal reflux disease)    HEADACHE 11/30/2007   Qualifier: Diagnosis of  By: Aleene Davidson MD, Merrily Pew    Infection    UTI   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/22/2020   Meningitis    Ovarian cyst    Pain 12/08/2013   Dyspareunia    Pelvic pain in female 07/06/2013   Right-sided back pain 09/28/2021   Strep throat 01/23/2022   Tinea versicolor 12/08/2013   Viral pharyngitis 04/02/2022    Past Surgical History:  Procedure Laterality Date   CHOLECYSTECTOMY  2016   COLONOSCOPY     LAPAROSCOPIC APPENDECTOMY N/A 11/10/2020   Procedure: LAPAROSCOPIC APPENDECTOMY;  Surgeon: Rodman Pickle, MD;  Location: [facility];  Service: General;  Laterality: N/A;   UPPER GASTROINTESTINAL ENDOSCOPY  2015   WISDOM TOOTH EXTRACTION      Current Outpatient Medications  Medication Sig Dispense Refill   busPIRone (BUSPAR) 10 MG tablet Take 1 tablet (10mg ) by mouth every day. May take 1/2 to 1 (5mg  to 10mg ) by mouth an additional two times during the day for anxiety symptoms, as needed. 90 tablet 6   Cholecalciferol (VITAMIN D3) 50 MCG (2000 UT) capsule Take 1 capsule (2,000 Units total) by mouth daily. 30 capsule 11   diazepam (VALIUM) 5 MG tablet Place 1 tablet vaginally nightly as needed for muscle spasm/ pelvic pain. (Patient not taking: Reported on 09/02/2022) 30 tablet 0   famotidine (PEPCID) 40 MG tablet Take 1 tablet (40 mg total) by  mouth at bedtime. 90 tablet 0   Fremanezumab-vfrm (AJOVY) 225 MG/1.5ML SOAJ Inject 1 Pen into the skin every 30 (thirty) days. 1.68 mL 4   hydrOXYzine (VISTARIL) 50 MG capsule Take 1 capsule (50 mg total) by mouth at bedtime and may repeat dose one time if needed. For sleep and anxiety. 60 capsule 6   ibuprofen (ADVIL) 800 MG tablet TAKE 1 TABLET(800 MG) BY MOUTH EVERY 8 HOURS AS NEEDED 30 tablet 5   JUNEL FE 1.5/30 1.5-30 MG-MCG tablet Take 1 tablet by mouth daily.     meloxicam (MOBIC) 15 MG tablet Take 1 tablet (15 mg total) by mouth daily as needed for pain. 30 tablet 1   omeprazole (PRILOSEC) 40 MG capsule Take 1 capsule (40 mg total) by mouth  daily. Take in the morning and at dinnertime. 30 capsule 11   Prucalopride Succinate (MOTEGRITY) 2 MG TABS TAKE 1 TABLET(2 MG) BY MOUTH DAILY 30 tablet 0   Rimegepant Sulfate (NURTEC) 75 MG TBDP Take 75 mg by mouth every other day. Take one pill at onset of migraine. Max dose 1 pill in 24 hours 16 tablet 3   No current facility-administered medications for this visit.    Allergies as of 09/10/2022 - Review Complete 09/02/2022  Allergen Reaction Noted   Penicillins Hives, Itching, and Rash 11/30/2007    Family History  Problem Relation Age of Onset   Migraines Mother    Hypertension Mother    Asthma Father    Birth defects Daughter        Limb reduction defect   Breast cancer Maternal Aunt 49   Colon cancer Neg Hx    Esophageal cancer Neg Hx    Rectal cancer Neg Hx    Stomach cancer Neg Hx    Colon polyps Neg Hx     Social History   Socioeconomic History   Marital status: Married    Spouse name: Not on file   Number of children: 3   Years of education: Not on file   Highest education level: 9th grade  Occupational History   Occupation: Stay at home mom  Tobacco Use   Smoking status: Never   Smokeless tobacco: Never  Vaping Use   Vaping Use: Never used  Substance and Sexual Activity   Alcohol use: Not Currently    Comment:  occ   Drug use: No   Sexual activity: Yes    Partners: Male    Birth control/protection: Pill, OCP  Other Topics Concern   Not on file  Social History Narrative   Lives with family   Caffeine- none    Social Determinants of Health   Financial Resource Strain: Not on file  Food Insecurity: Not on file  Transportation Needs: Not on file  Physical Activity: Not on file  Stress: Not on file  Social Connections: Not on file  Intimate Partner Violence: Not on file    Review of Systems:    Constitutional: No weight loss, fever or chills Cardiovascular: No chest pain   Respiratory: No SOB  Gastrointestinal: See HPI and otherwise negative   Physical Exam:  Vital signs: BP 106/76   Pulse 74   Ht 5' (1.524 m)   Wt 160 lb (72.6 kg)   BMI 31.25 kg/m    Constitutional:   Pleasant female appears to be in NAD, Well developed, Well nourished, alert and cooperative Respiratory: Respirations even and unlabored. Lungs clear to auscultation bilaterally.   No wheezes, crackles, or rhonchi.  Cardiovascular: Normal S1, S2. No MRG. Regular rate and rhythm. No peripheral edema, cyanosis or pallor.  Gastrointestinal:  Soft, nondistended, moderate epigastric ttp, No rebound or guarding. Normal bowel sounds. No appreciable masses or hepatomegaly. Rectal:  Not performed.  Psychiatric: Demonstrates good judgement and reason without abnormal affect or behaviors.  RELEVANT LABS AND IMAGING: CBC    Component Value Date/Time   WBC 11.2 (H) 08/01/2022 1105   WBC 10.0 11/07/2020 0838   RBC 4.58 08/01/2022 1105   RBC 4.28 11/07/2020 0838   HGB 13.6 08/01/2022 1105   HCT 41.0 08/01/2022 1105   PLT 442 08/01/2022 1105   MCV 90 08/01/2022 1105   MCH 29.7 08/01/2022 1105   MCH 29.7 11/07/2020 0838   MCHC 33.2 08/01/2022 1105   MCHC 32.8  11/07/2020 0838   RDW 12.2 08/01/2022 1105   LYMPHSABS 3.1 06/06/2021 1042   MONOABS 0.4 08/01/2020 1403   EOSABS 0.1 06/06/2021 1042   BASOSABS 0.0 06/06/2021  1042    CMP     Component Value Date/Time   NA 141 08/01/2022 1105   K 4.8 08/01/2022 1105   CL 104 08/01/2022 1105   CO2 21 08/01/2022 1105   GLUCOSE 75 08/01/2022 1105   GLUCOSE 102 (H) 08/01/2020 1403   BUN 7 08/01/2022 1105   CREATININE 0.60 08/01/2022 1105   CALCIUM 9.9 08/01/2022 1105   PROT 6.9 08/01/2022 1105   ALBUMIN 4.4 08/01/2022 1105   AST 12 08/01/2022 1105   ALT 8 08/01/2022 1105   ALKPHOS 46 08/01/2022 1105   BILITOT 0.3 08/01/2022 1105   GFRNONAA >60 06/04/2015 1412   GFRAA >60 06/04/2015 1412    Assessment: 1.  Epigastric pain: History of gastritis previously maintained on Omeprazole 40 mg daily, recent increase in the past 2 to 3 months, actually after she started taking Nurtec for her migraines (this is listed as a side effect); consider acute on chronic gastritis +/- medication side effect +/- H. pylori 2.  Bloating: With above 3.  Chronic constipation: Doing well on Motegrity daily  Plan: 1.  We will test patient for H. pylori with Diatherix stool testing.  This was ordered today and explained to her. 2.  Increased Omeprazole to 40 mg twice daily, 30-60 minutes before breakfast and dinner, #60 with 3 refills for now 3.  Discussed possibility this is related to her new migraine medication 4.  Patient to follow in clinic with me in 2 to 3 months.  If no better we will need to discuss discontinuing the Nurtec versus repeat EGD versus other  Lydia Meeker, PA-C [facility] 09/10/2022, 10:07 AM  Cc: Tollie Eth, NP

## 2022-09-13 ENCOUNTER — Other Ambulatory Visit: Payer: Self-pay | Admitting: Physician Assistant

## 2022-09-13 NOTE — Progress Notes (Signed)
Lab result: 11:24 AM 5 3/24  Received results from recent Diatherix testing for H. pylori, this was not detected.  [place] please call and follow-up with the patient and see how she is doing now on increased dose of PPI.  Hyacinth Meeker, PA-C

## 2022-09-13 NOTE — Progress Notes (Signed)
Called and left patient a detailed vm with Diatherix H. Pylori results. I asked that patient give [place] a call back and let us know how she is feeling on Omeprazole BID.

## 2022-09-16 ENCOUNTER — Encounter: Payer: Self-pay | Admitting: Nurse Practitioner

## 2022-09-16 NOTE — Progress Notes (Signed)
Called and spoke with patient regarding Diatherix results. Pt states that her symptoms have improved some on Omeprazole BID. I told pt to make sur she is taking medication 30-60 minutes prior to breakfast and her evening meal. We will see patient for follow up in July. Pt verbalized understanding and had no concerns at the end of the call.

## 2022-09-18 ENCOUNTER — Ambulatory Visit (INDEPENDENT_AMBULATORY_CARE_PROVIDER_SITE_OTHER): Payer: Medicaid Other | Admitting: Nurse Practitioner

## 2022-09-18 ENCOUNTER — Encounter: Payer: Self-pay | Admitting: Nurse Practitioner

## 2022-09-18 VITALS — BP 124/78 | HR 78 | Wt 157.8 lb

## 2022-09-18 DIAGNOSIS — L509 Urticaria, unspecified: Secondary | ICD-10-CM

## 2022-09-18 MED ORDER — PREDNISONE 20 MG PO TABS: ORAL_TABLET | ORAL | 0 refills | Status: DC

## 2022-09-18 MED ORDER — METHYLPREDNISOLONE SODIUM SUCC 125 MG IJ SOLR
125.0000 mg | Freq: Once | INTRAMUSCULAR | Status: AC
  Administered 2022-09-18: 62.5 mg via INTRAMUSCULAR

## 2022-09-18 NOTE — Progress Notes (Signed)
Lydia Eth, DNP, AGNP-c [facility] [address] [place], [place] [address] [phone]  Subjective:   Lydia [name] is a 37 y.o. female presents to day for evaluation of: Itching.  Lydia [name] presents today with a chief complaint concern of generalized itching all over her body.  She reports experiencing similar episode 2 years ago of unknown etiology.  She has not introduced any new lotions, perfumes, detergents, medications, or other environmental factors recently.  She denies any recent changes in the home environment or with cleaning products.  She has noticed a rash and redness on her skin, particularly on her hands and arms.  The itching also affects her back, legs, and thighs.  Lydia [name] states that the itching is particularly bothersome at night, making it difficult for her to sleep.  She denies any worsening of symptoms when she gets warm and has no difficulty breathing, swallowing, or feelings of fullness in her tongue or throat.  She has tried taking Benadryl and using ointments poison ivy to alleviate the itching, with only temporary relief.  She has a history of taking oral steroids for similar issue in the past.  She does have a medication allergy to penicillin but has not had any antibiotics in the recent past.  Lydia [name] does report she is under daily stress but does not feel her recent stress levels have increased.  She has a history of taking hydroxyzine for anxiety and sleep and currently has some at home.  She also has famotidine at home.  PMH, Medications, and Allergies reviewed and updated in chart as appropriate.   ROS negative except for what is listed in HPI. Objective:  BP 124/78   Pulse 78   Wt 157 lb 12.8 oz (71.6 kg)   BMI 30.82 kg/m  Physical Exam Constitutional:      General: She is not in acute distress.    Appearance: Normal appearance. She is not toxic-appearing.  Eyes:     Conjunctiva/sclera: Conjunctivae normal.  Cardiovascular:     Rate and  Rhythm: Normal rate and regular rhythm.     Pulses: Normal pulses.     Heart sounds: Normal heart sounds.  Pulmonary:     Effort: Pulmonary effort is normal.     Breath sounds: Normal breath sounds.  Musculoskeletal:        General: No swelling.  Skin:    General: Skin is warm and dry.     Capillary Refill: Capillary refill takes less than 2 seconds.     Findings: Erythema and rash present. Rash is urticarial.       Neurological:     General: No focal deficit present.     Mental Status: She is alert.  Psychiatric:        Mood and Affect: Mood normal.           Assessment & Plan:   Problem List Items Addressed This Visit     Urticaria of unknown origin - Primary    [name] presents with generalized pruritic rash and urticaria today.  I suspect this may be an allergic reaction or possible idiopathic urticaria.  Given the severity of symptoms we will take an aggressive approach to provide relief. Plan: - Administer steroid injection in the office today and prescribed prednisone taper to be started tomorrow. - Recommend taking hydroxyzine up to 2 tablets (50 mg) at bedtime. - Take famotidine 40 mg twice daily to assist in histamine blockage. - Use soothing lotions and cool baths, showers for symptomatic  relief. - If symptoms are not improved by Thursday afternoon or Friday morning please contact the office. -We will monitor labs today to determine if we can run the medial      Relevant Medications   predniSONE (DELTASONE) 20 MG tablet   Other Relevant Orders   CBC with Differential (Completed)   TSH + free T4 (Completed)   C-reactive protein (Completed)      Lydia Eth, DNP, AGNP-c 10/08/2022  7:00 AM    Time: 31 minutes, >50% spent counseling, care coordination, chart review, and documentation.   History, Medications, Surgery, SDOH, and Family History reviewed and updated as appropriate.

## 2022-09-18 NOTE — Patient Instructions (Addendum)
We have given you a shot of steroid today to help with the itching. This should start working immediately.   Tomorrow, start with the steroid taper.   You can take 1-2 hydroxyzine at bedtime to help with the itch.   Take pepcid in the morning and at bedtime for 10 days.   If this is not better by Friday morning, please let us know.

## 2022-09-19 LAB — CBC WITH DIFFERENTIAL/PLATELET
Basophils Absolute: 0 10*3/uL (ref 0.0–0.2)
Basos: 0 %
EOS (ABSOLUTE): 0 10*3/uL (ref 0.0–0.4)
Eos: 0 %
Hematocrit: 43.1 % (ref 34.0–46.6)
Hemoglobin: 14 g/dL (ref 11.1–15.9)
Immature Grans (Abs): 0 10*3/uL (ref 0.0–0.1)
Immature Granulocytes: 0 %
Lymphocytes Absolute: 3 10*3/uL (ref 0.7–3.1)
Lymphs: 23 %
MCH: 29.1 pg (ref 26.6–33.0)
MCHC: 32.5 g/dL (ref 31.5–35.7)
MCV: 90 fL (ref 79–97)
Monocytes Absolute: 0.4 10*3/uL (ref 0.1–0.9)
Monocytes: 3 %
Neutrophils Absolute: 9.7 10*3/uL — ABNORMAL HIGH (ref 1.4–7.0)
Neutrophils: 74 %
Platelets: 440 10*3/uL (ref 150–450)
RBC: 4.81 x10E6/uL (ref 3.77–5.28)
RDW: 11.8 % (ref 11.7–15.4)
WBC: 13.2 10*3/uL — ABNORMAL HIGH (ref 3.4–10.8)

## 2022-09-19 LAB — TSH+FREE T4
Free T4: 1.07 ng/dL (ref 0.82–1.77)
TSH: 1.38 u[IU]/mL (ref 0.450–4.500)

## 2022-09-19 LAB — C-REACTIVE PROTEIN: CRP: 4 mg/L (ref 0–10)

## 2022-09-24 ENCOUNTER — Encounter: Payer: Self-pay | Admitting: Nurse Practitioner

## 2022-09-24 ENCOUNTER — Other Ambulatory Visit: Payer: Self-pay | Admitting: Internal Medicine

## 2022-09-25 MED ORDER — MOTEGRITY 2 MG PO TABS: 2.0000 mg | ORAL_TABLET | Freq: Every day | ORAL | 2 refills | Status: DC

## 2022-10-08 DIAGNOSIS — L509 Urticaria, unspecified: Secondary | ICD-10-CM | POA: Insufficient documentation

## 2022-10-08 NOTE — Assessment & Plan Note (Addendum)
Lydia [name] presents with generalized pruritic rash and urticaria today.  I suspect this may be an allergic reaction or possible idiopathic urticaria.  Given the severity of symptoms we will take an aggressive approach to provide relief. Plan: - Administer steroid injection in the office today and prescribed prednisone taper to be started tomorrow. - Recommend taking hydroxyzine up to 2 tablets (50 mg) at bedtime. - Take famotidine 40 mg twice daily to assist in histamine blockage. - Use soothing lotions and cool baths, showers for symptomatic relief. - If symptoms are not improved by Thursday afternoon or Friday morning please contact the office. -We will monitor labs today to determine if we can run the medial

## 2022-10-14 ENCOUNTER — Other Ambulatory Visit (HOSPITAL_BASED_OUTPATIENT_CLINIC_OR_DEPARTMENT_OTHER): Payer: Self-pay | Admitting: Nurse Practitioner

## 2022-10-14 DIAGNOSIS — R202 Paresthesia of skin: Secondary | ICD-10-CM

## 2022-10-15 ENCOUNTER — Encounter: Payer: Self-pay | Admitting: Nurse Practitioner

## 2022-10-16 ENCOUNTER — Ambulatory Visit (INDEPENDENT_AMBULATORY_CARE_PROVIDER_SITE_OTHER): Payer: Medicaid Other | Admitting: Nurse Practitioner

## 2022-10-16 ENCOUNTER — Encounter: Payer: Self-pay | Admitting: Nurse Practitioner

## 2022-10-16 VITALS — BP 124/78 | HR 60 | Wt 161.6 lb

## 2022-10-16 DIAGNOSIS — W57XXXA Bitten or stung by nonvenomous insect and other nonvenomous arthropods, initial encounter: Secondary | ICD-10-CM | POA: Diagnosis not present

## 2022-10-16 DIAGNOSIS — S20362A Insect bite (nonvenomous) of left front wall of thorax, initial encounter: Secondary | ICD-10-CM | POA: Diagnosis not present

## 2022-10-16 MED ORDER — TRIAMCINOLONE ACETONIDE 0.1 % EX CREA: 1.0000 | TOPICAL_CREAM | Freq: Two times a day (BID) | CUTANEOUS | 2 refills | Status: DC

## 2022-10-16 MED ORDER — DOXYCYCLINE HYCLATE 100 MG PO TABS: 200.0000 mg | ORAL_TABLET | Freq: Two times a day (BID) | ORAL | 0 refills | Status: DC

## 2022-10-16 NOTE — Progress Notes (Signed)
  Tollie Eth, DNP, AGNP-c [facility] [address] [place], [place] [address] [phone]  Subjective:   Lydia [name] is a 37 y.o. female presents to day for evaluation of: Tick Bite Lydia [name] presents today with a tick bite that occurred about three weeks ago. There area is red and itching. She initially noticed and removed the tick on a Thursday, but she feels the tick likely attached on Sunday. She describes the tick as small and dark in coloration, suggestive of a dear tick.    PMH, Medications, and Allergies reviewed and updated in chart as appropriate.   ROS negative except for what is listed in HPI. Objective:  BP 124/78   Pulse 60   Wt 161 lb 9.6 oz (73.3 kg)   BMI 31.56 kg/m  Physical Exam Vitals and nursing note reviewed.  Constitutional:      Appearance: Normal appearance.  HENT:     Head: Normocephalic.  Eyes:     Pupils: Pupils are equal, round, and reactive to light.  Cardiovascular:     Rate and Rhythm: Normal rate and regular rhythm.     Pulses: Normal pulses.     Heart sounds: Normal heart sounds.  Pulmonary:     Effort: Pulmonary effort is normal.     Breath sounds: Normal breath sounds.  Musculoskeletal:        General: Normal range of motion.     Cervical back: Normal range of motion.  Skin:    General: Skin is warm.     Findings: Erythema present.     Comments: Erythematous 2mm circular lesion on the flank. No additional rashes or bulls-eye detected.   Neurological:     General: No focal deficit present.     Mental Status: She is alert and oriented to person, place, and time.  Psychiatric:        Mood and Affect: Mood normal.           Assessment & Plan:   Problem List Items Addressed This Visit     Tick bite of left front wall of thorax - Primary    Tick bite with tick attached >12 hours. Erythema and itching are present despite three weeks since removal. No alarm symptoms are present, but given the length of time the  tick was present and the remaining itching of the area, I recommend prophylactic treatment.  Plan: - Initiate doxycycline BID for two days - Labs for lyme serology  - Consider extended treatment if labs are positive. - Follow-up if symptoms worsen or new symptoms develop.       Relevant Medications   triamcinolone cream (KENALOG) 0.1 %   Other Relevant Orders   Lyme Disease Serology w/Reflex (Completed)      Tollie Eth, DNP, AGNP-c 12/08/2022  9:07 PM    History, Medications, Surgery, SDOH, and Family History reviewed and updated as appropriate.

## 2022-10-16 NOTE — Patient Instructions (Signed)
Tick Bite Information, Adult  Ticks are insects that draw blood for food. They climb onto people and animals that brush against the leaves and grasses that they live in. They then bite and attach to the skin. Most ticks are harmless, but some ticks may carry germs that can cause disease. These germs are spread to a person through a bite. To lower your risk of getting a disease from a tick bite, make sure you: Take steps to prevent tick bites. Check for ticks after being outdoors where ticks live. Watch for symptoms of disease if a tick attached to you or if you think a tick bit you. How can I prevent tick bites? Take these steps to help prevent tick bites when you go outdoors in an area where ticks live: Before you go outdoors: Wear long sleeves and long pants to protect your skin from ticks. Wear light-colored clothing so you can see ticks easier. Tuck your pant legs into your socks. Apply insect repellent that has DEET (20% or higher), picaridin, or IR3535 in it to the following areas: Any bare skin. Avoid areas around the eyes and mouth. Edges of clothing, like the top of your boots, the bottom of your pant legs, and your sleeve cuffs. Consider applying an insect repellant that contains permethrin. Follow the instructions on the label. Do not apply permethrin directly to the skin. Instead, apply to the following areas: Clothing and shoes. Outdoor gear and tents. When you are outdoors: Avoid walking through areas with long grass. If you are walking on a trail, stay in the middle of the trail so your skin, hair, and clothing do not touch the bushes. Check for ticks on your clothing, hair, and skin often while you are outdoors. Check again before you go inside. When you go indoors: Check your clothing for ticks. Tumble dry clothes in a dryer on high heat for at least 10 minutes. If clothes are damp, additional time may be needed. If clothes require washing, use hot water. Check your gear and  pets. Shower soon after being outdoors. Check your body for ticks. Do a full body check using a mirror. Be sure to check your scalp, neck, armpits, waist, groin, and joint areas. These are the spots where ticks attach themselves most often. What is the best way to remove a tick?  Remove the tick as soon as possible. Removing it can prevent germs from passing to your body. Do not remove the tick with your bare fingers. Do not try to remove a tick with heat, alcohol, petroleum jelly, or fingernail polish. These things can cause the tick to salivate and regurgitate into your bloodstream, increasing your risk of getting a disease. To remove a tick that is crawling on your skin: Go outside and brush the tick off. Use tape or a lint roller. To remove a tick that is attached to your skin: Wash your hands. If you have gloves, put them on. Use a fine-tipped tweezer, curved forceps, or a tick-removal tool to gently grasp the tick as close to your skin and the tick's head as possible. Gently pull with a steady, upward, and even pressure until the tick lets go. While removing the tick: Take care to keep the tick's head attached to its body. Do not twist or jerk the tick. This can make the tick's head or mouth parts break off and stay in your skin. If this happens, try to remove the mouth parts with tweezers. If you cannot remove them, leave   the area alone and let the skin heal. Do not squeeze or crush the tick's body. This could force disease-carrying fluids from the tick into your body. What should I do after removing a tick? Clean the bite area and your hands with soap and water, rubbing alcohol, or an iodine scrub. If an antiseptic cream or ointment is available, put a small amount on the bite area. Wash and disinfect any tools that you used to remove the tick. How should I dispose of a tick? To dispose of a live tick, use one of these methods: Place it in rubbing alcohol. Place it in a sealed bag  or container, and throw it away. Wrap it tightly in tape, and throw it away. Flush it down the toilet. Where to find more information Centers for Disease Control and Prevention: cdc.gov/ticks U.S. Environmental Protection Agency: epa.gov/insect-repellents Contact a health care provider if: You have symptoms of a disease after a tick bite. Symptoms of a tick-borne disease can occur from moments after the tick bites to 30 days after a tick is removed. Symptoms include: Fever or chills. A red rash that makes a circle (bull's-eye rash) in the bite area. Redness and swelling in the bite area. Headache or stiff neck. Muscle, joint, or bone pain. Abnormal tiredness. Numbness in your legs or trouble walking or moving your legs. Tender or swollen lymph glands. Abdominal pain, vomiting, diarrhea, or weight loss. Get help right away if: You are not able to remove a tick. You have muscle weakness or paralysis. Your symptoms get worse or you experience new symptoms. You find an engorged tick on your skin and you are in an area where there is a higher risk of disease from ticks. Summary Ticks may carry germs that can spread to a person through a bite. These germs can cause disease. Wear protective clothing and use insect repellent to prevent tick bites. Follow the instructions on the label. If you find a tick on your body, remove it as soon as possible. If the tick is attached, do not try to remove it with heat, alcohol, petroleum jelly, or fingernail polish. If you have symptoms of a disease after being bitten by a tick, contact a health care provider. This information is not intended to replace advice given to you by your health care provider. Make sure you discuss any questions you have with your health care provider. Document Revised: 07/30/2021 Document Reviewed: 07/30/2021 Elsevier Patient Education  2024 Elsevier Inc.  

## 2022-10-17 LAB — LYME DISEASE SEROLOGY W/REFLEX: Lyme Total Antibody EIA: NEGATIVE

## 2022-10-20 ENCOUNTER — Encounter: Payer: Self-pay | Admitting: Nurse Practitioner

## 2022-10-23 ENCOUNTER — Ambulatory Visit (INDEPENDENT_AMBULATORY_CARE_PROVIDER_SITE_OTHER): Payer: Medicaid Other | Admitting: Medical

## 2022-10-23 VITALS — BP 110/68 | HR 70 | Temp 97.1°F | Wt 161.0 lb

## 2022-10-23 DIAGNOSIS — W57XXXD Bitten or stung by nonvenomous insect and other nonvenomous arthropods, subsequent encounter: Secondary | ICD-10-CM

## 2022-10-23 DIAGNOSIS — R21 Rash and other nonspecific skin eruption: Secondary | ICD-10-CM | POA: Diagnosis not present

## 2022-10-23 DIAGNOSIS — L299 Pruritus, unspecified: Secondary | ICD-10-CM

## 2022-10-23 MED ORDER — CLOBETASOL PROPIONATE 0.05 % EX OINT: 1.0000 | TOPICAL_OINTMENT | Freq: Two times a day (BID) | CUTANEOUS | 0 refills | Status: DC

## 2022-10-23 MED ORDER — PREDNISONE 10 MG PO TABS: ORAL_TABLET | ORAL | 0 refills | Status: DC

## 2022-10-23 NOTE — Progress Notes (Signed)
Subjective:  Lydia [name] is a 37 y.o. female who presents for Chief Complaint  Patient presents with   Rash    Rash on left arm, hand, neck, some on stomach and back     Here for rash on several parts of her body.  She came in 2 weeks ago for tick bite on her side.  However in the past week or so she has new little bumps including right hand, left forearm, torso.  Each of them are small and red but itchy.  She has been using triamcinolone cream prescribed that does not seem to be helping.  Hot shower made it worse.  All rash areas are itchy.  She thinks she had some mosquito bites as well recently.  No fever, no contacts with similar rash, no other new exposures.  No other aggravating or relieving factors.    No other c/o.  Past Medical History:  Diagnosis Date   Abdominal pain, epigastric 01/26/2014   Acute recurrent maxillary sinusitis 03/11/2014   Allergy    Anxiety    Chronic constipation    Motegrity helps    Chronic headaches    none x 2-3 yrs   GERD (gastroesophageal reflux disease)    HEADACHE 11/30/2007   Qualifier: Diagnosis of  By: Aleene Davidson MD, Merrily Pew    Infection    UTI   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/22/2020   Meningitis    Ovarian cyst    Pain 12/08/2013   Dyspareunia    Pelvic pain in female 07/06/2013   Right-sided back pain 09/28/2021   Strep throat 01/23/2022   Tinea versicolor 12/08/2013   Viral pharyngitis 04/02/2022   Current Outpatient Medications on File Prior to Visit  Medication Sig Dispense Refill   busPIRone (BUSPAR) 10 MG tablet Take 1 tablet (10mg ) by mouth every day. May take 1/2 to 1 (5mg  to 10mg ) by mouth an additional two times during the day for anxiety symptoms, as needed. 90 tablet 6   famotidine (PEPCID) 40 MG tablet Take 1 tablet (40 mg total) by mouth at bedtime. 90 tablet 0   Fremanezumab-vfrm (AJOVY) 225 MG/1.5ML SOAJ Inject 1 Pen into the skin every 30 (thirty) days. 1.68 mL 4   hydrOXYzine (VISTARIL) 50 MG capsule Take 1  capsule (50 mg total) by mouth at bedtime and may repeat dose one time if needed. For sleep and anxiety. 60 capsule 6   JUNEL FE 1.5/30 1.5-30 MG-MCG tablet Take 1 tablet by mouth daily.     Prucalopride Succinate (MOTEGRITY) 2 MG TABS Take 1 tablet (2 mg total) by mouth daily. 30 tablet 2   Rimegepant Sulfate (NURTEC) 75 MG TBDP Take 75 mg by mouth every other day. Take one pill at onset of migraine. Max dose 1 pill in 24 hours 16 tablet 3   triamcinolone cream (KENALOG) 0.1 % Apply 1 Application topically 2 (two) times daily. 45 g 2   ibuprofen (ADVIL) 800 MG tablet TAKE 1 TABLET(800 MG) BY MOUTH EVERY 8 HOURS AS NEEDED (Patient not taking: Reported on 10/23/2022) 30 tablet 5   No current facility-administered medications on file prior to visit.     The following portions of the patient's history were reviewed and updated as appropriate: allergies, current medications, past family history, past medical history, past social history, past surgical history and problem list.  ROS Otherwise as in subjective above     Objective: BP 110/68   Pulse 70   Temp (!) 97.1 F (  36.2 C)   Wt 161 lb (73 kg)   BMI 31.44 kg/m   General appearance: alert, no distress, well developed, well nourished Skin: Right hand dorsal hand between thumb and index finger with a 1 cm slightly raised erythematous lesion, several similar small 3 to 4 mm erythematous rash nonspecific on left posterior forearm, left and right torso, right lumbar back region.  None of the rash area is indurated warm or fluctuant.  All suggestive of insect bite or papular lesions   Assessment: Encounter Diagnoses  Name Primary?   Rash Yes   Itching    Insect bite, unspecified site, subsequent encounter      Plan: We discussed her findings which are scattered small erythematous rash suggestive of likely recent mosquito bite or insect bite unrelated to the tick bite.  Advise she continue hydroxyzine low-dose such as 12.5 mg or 25 mg  in the morning and possibly at lunch and then 25 or 50 mg at night to help with itching for the next few days.  Stop triamcinolone and change to clobetasol ointment for the rash since this is stronger and may clear up the rash a little easier.  Avoid hot showers.  Continue to keep the different areas of rash clean with soap and water.  If not fully resolved within a week or if new symptoms or not improving then let us know.  Call back or recheck in 1 week.  [name] was seen today for rash.  Diagnoses and all orders for this visit:  Rash  Itching  Insect bite, unspecified site, subsequent encounter  Other orders -     clobetasol ointment (TEMOVATE) 0.05 %; Apply 1 Application topically 2 (two) times daily. -     predniSONE (DELTASONE) 10 MG tablet; 6 tablets all together day 1, 5 tablets day 2, 4 tablets day 3, 3 tablets day 4, 2 tablets day 5, 1 tablet day 6.    Follow up: 1 week recheck or call report

## 2022-10-28 ENCOUNTER — Telehealth: Payer: Medicaid Other | Admitting: Nurse Practitioner

## 2022-11-11 MED ORDER — MOTEGRITY 2 MG PO TABS: 2.0000 mg | ORAL_TABLET | Freq: Every day | ORAL | 2 refills | Status: DC

## 2022-12-08 DIAGNOSIS — W57XXXA Bitten or stung by nonvenomous insect and other nonvenomous arthropods, initial encounter: Secondary | ICD-10-CM | POA: Insufficient documentation

## 2022-12-08 NOTE — Assessment & Plan Note (Signed)
Tick bite with tick attached >12 hours. Erythema and itching are present despite three weeks since removal. No alarm symptoms are present, but given the length of time the tick was present and the remaining itching of the area, I recommend prophylactic treatment.  Plan: - Initiate doxycycline BID for two days - Labs for lyme serology  - Consider extended treatment if labs are positive. - Follow-up if symptoms worsen or new symptoms develop.

## 2022-12-10 ENCOUNTER — Ambulatory Visit (INDEPENDENT_AMBULATORY_CARE_PROVIDER_SITE_OTHER): Payer: Medicaid Other | Admitting: Physician Assistant

## 2022-12-10 ENCOUNTER — Encounter: Payer: Self-pay | Admitting: Physician Assistant

## 2022-12-10 VITALS — BP 104/70 | HR 76 | Ht 59.5 in | Wt 163.5 lb

## 2022-12-10 DIAGNOSIS — R1013 Epigastric pain: Secondary | ICD-10-CM | POA: Diagnosis not present

## 2022-12-10 DIAGNOSIS — R140 Abdominal distension (gaseous): Secondary | ICD-10-CM | POA: Diagnosis not present

## 2022-12-10 MED ORDER — FAMOTIDINE 40 MG PO TABS: 40.0000 mg | ORAL_TABLET | Freq: Two times a day (BID) | ORAL | 5 refills | Status: DC

## 2022-12-10 NOTE — Progress Notes (Signed)
Chief Complaint: Follow up abdominal pain and bloating  HPI:    Lydia [name] is a 37 year old female with a past medical history as listed below, known to Dr. Rhea Belton, who returns to clinic today for follow-up of abdominal pain and bloating.      01/05/2019 patient saw Dr. Rhea Belton and described bloating and epigastric discomfort.  She was on Pantoprazole twice a day and it was not helping.  It was recommended she have an EGD.  She was also continued on Linzess 145 mcg 2 days on and off 1 day.  For chronic constipation.    03/12/2019 EGD with mild reflux esophagitis.  Pantoprazole stopped.  Prescribed Dexilant 60 mg daily.  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      05/31/2019 patient was seen in clinic by me for follow-up of dyspepsia and epigastric pain.  At that time was doing well, no further epigastric discomfort.  Very occasionally she would have a little bit of stomach upset but only lasted for short time.  She was happy with her current medications including Dexilant 60 mg daily and amitriptyline 50 mg nightly as well as every other day Linzess.  She was continued on Linzess 145 mcg 1 tablet 2 days in a row then skip a day as well as Dexilant 60 mg daily and amitriptyline 50 mg nightly.    03/24/2020 colonoscopy Dr.Pyrtle with ileitis, rule out Crohn's disease, capacious colon, particularly in the left colon likely in part explain constipation (versus result of).  Otherwise normal.  Repeat recommended at 46.  Biopsies from her small bowel showed active inflammation but no chronic inflammation, this was nonspecific but did not appear to be classic for Crohn's disease.  It was thought the possibly NSAIDs caused this.  Recommend she avoid these.    08/01/2020 patient described chronic right upper quadrant pain.  Constipation is controlled on Motegrity.  At that time ordered labs including CBC, CMP and lipase also an abdominal ultrasound with focus on the right upper quadrant  and prescribed Zofran as needed.  Discussed repeating endoscopy if nausea and pain continued for CT scan.  Did discuss this may be scar tissue which pulls at times.    08/09/2020 abdominal ultrasound hepatomegaly and otherwise normal.    09/18/2020 EGD was normal.    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  She was referred to [facility].    01/23/2021 CT the abdomen pelvis with contrast showed probable small left ovarian cyst and resection of the mesenteric lesion.    08/28/2021 patient seen in clinic by me and described chronic right-sided abdominal pain.  Described as stabbing.  Sometimes associated with nausea.  That time discussed full evaluation by her team previously with EGD, colonoscopy, ultrasound, CT and MR enterography discussed that this could be musculoskeletal.  She was referred to Terrilee Files.    09/28/2021 patient seen by Terrilee Files.  Patient had an osteopathic manipulation.    05/21/2022 patient seen by PCP for urinary frequency, burning and back pain.  She was started on antibiotic.    05/28/2022 pelvic ultrasound with questionable 10 mm intramural leiomyoma posterior upper uterus and trace nonspecific endometrial fluid.    08/01/2022 MRI lumbar spine without contrast showed small disc bulge and mild bilateral facet arthropathy at L5-S1 without spinal canal stenosis.    09/10/2022 patient seen in clinic and described epigastric pain, bloating  and nausea over the past 2 to 3 months.  She was on Omeprazole 40 mg daily.  Also continue Motegrity daily for bowel movements.  At that point tested for H. pylori with [employer] testing.  Increase Omeprazole to 40 twice daily and discussed that this is possibly related to her new migraine medication.  Told to follow-up in 2 to 3 months.  If no better discussed discontinuing the Nurtec versus repeat EGD.    11/11/2022 refill sent in for  Motegrity.    Today, patient presents to clinic and tells me that her symptoms are definitely less frequent.  She is not bloated as frequently but she does continue with epigastric discomfort that sometimes starts after eating and other times it starts out of nowhere as well as occasional nausea which she can control.  Reminds me that Omeprazole made her symptoms worse.  She is still taking Famotidine 40 mg nightly and does feel like it is helpful.  Continues on Motegrity 2 mg daily for constipation which is allowing her to have daily bowel movements.    She has 3 children her oldest is 88.    Denies fever, chills or weight loss.  Past Medical History:  Diagnosis Date   Abdominal pain, epigastric 01/26/2014   Acute recurrent maxillary sinusitis 03/11/2014   Allergy    Anxiety    Chronic constipation    Motegrity helps    Chronic headaches    none x 2-3 yrs   GERD (gastroesophageal reflux disease)    HEADACHE 11/30/2007   Qualifier: Diagnosis of  By: Aleene Davidson MD, Merrily Pew    Infection    UTI   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/22/2020   Meningitis    Ovarian cyst    Pain 12/08/2013   Dyspareunia    Pelvic pain in female 07/06/2013   Right-sided back pain 09/28/2021   Strep throat 01/23/2022   Tinea versicolor 12/08/2013   Viral pharyngitis 04/02/2022    Past Surgical History:  Procedure Laterality Date   CHOLECYSTECTOMY  2016   COLONOSCOPY     LAPAROSCOPIC APPENDECTOMY N/A 11/10/2020   Procedure: LAPAROSCOPIC APPENDECTOMY;  Surgeon: Rodman Pickle, MD;  Location: [facility];  Service: General;  Laterality: N/A;   UPPER GASTROINTESTINAL ENDOSCOPY  2015   WISDOM TOOTH EXTRACTION      Current Outpatient Medications  Medication Sig Dispense Refill   busPIRone (BUSPAR) 10 MG tablet Take 1 tablet (10mg ) by mouth every day. May take 1/2 to 1 (5mg  to 10mg ) by mouth an additional two times during the day for anxiety symptoms, as needed. 90 tablet 6   clobetasol ointment (TEMOVATE)  0.05 % Apply 1 Application topically 2 (two) times daily. 30 g 0   famotidine (PEPCID) 40 MG tablet Take 1 tablet (40 mg total) by mouth at bedtime. 90 tablet 0   Fremanezumab-vfrm (AJOVY) 225 MG/1.5ML SOAJ Inject 1 Pen into the skin every 30 (thirty) days. 1.68 mL 4   hydrOXYzine (VISTARIL) 50 MG capsule Take 1 capsule (50 mg total) by mouth at bedtime and may repeat dose one time if needed. For sleep and anxiety. 60 capsule 6   JUNEL FE 1.5/30 1.5-30 MG-MCG tablet Take 1 tablet by mouth daily.     predniSONE (DELTASONE) 10 MG tablet 6 tablets all together day 1, 5 tablets day 2, 4 tablets day 3, 3 tablets day 4, 2 tablets day 5, 1 tablet day 6. 21 tablet 0   Prucalopride Succinate (MOTEGRITY) 2 MG TABS Take  1 tablet (2 mg total) by mouth daily. 30 tablet 2   Rimegepant Sulfate (NURTEC) 75 MG TBDP Take 75 mg by mouth every other day. Take one pill at onset of migraine. Max dose 1 pill in 24 hours 16 tablet 3   triamcinolone cream (KENALOG) 0.1 % Apply 1 Application topically 2 (two) times daily. 45 g 2   No current facility-administered medications for this visit.    Allergies as of 12/10/2022 - Review Complete 10/23/2022  Allergen Reaction Noted   Penicillins Hives, Itching, and Rash 11/30/2007    Family History  Problem Relation Age of Onset   Migraines Mother    Hypertension Mother    Asthma Father    Birth defects Daughter        Limb reduction defect   Breast cancer Maternal Aunt 65   Colon cancer Neg Hx    Esophageal cancer Neg Hx    Rectal cancer Neg Hx    Stomach cancer Neg Hx    Colon polyps Neg Hx     Social History   Socioeconomic History   Marital status: Married    Spouse name: Not on file   Number of children: 3   Years of education: Not on file   Highest education level: 9th grade  Occupational History   Occupation: Stay at home mom  Tobacco Use   Smoking status: Never   Smokeless tobacco: Never  Vaping Use   Vaping status: Never Used  Substance and  Sexual Activity   Alcohol use: Not Currently    Comment: occ   Drug use: No   Sexual activity: Yes    Partners: Male    Birth control/protection: Pill, OCP  Other Topics Concern   Not on file  Social History Narrative   Lives with family   Caffeine- none    Social Determinants of Health   Financial Resource Strain: Not on file  Food Insecurity: Not on file  Transportation Needs: Not on file  Physical Activity: Not on file  Stress: Not on file  Social Connections: Not on file  Intimate Partner Violence: Not on file   Review of Systems:    Constitutional: No weight loss, fever or chills Cardiovascular: No chest pain Respiratory: No SOB  Gastrointestinal: See HPI and otherwise negative   Physical Exam:  Vital signs: BP 104/70 (BP Location: Left Arm, Patient Position: Sitting, Cuff Size: Normal)   Pulse 76   Ht 4' 11.5" (1.511 m)   Wt 163 lb 8 oz (74.2 kg)   LMP 11/19/2022   BMI 32.47 kg/m    Constitutional:   Pleasant female appears to be in NAD, Well developed, Well nourished, alert and cooperative Respiratory: Respirations even and unlabored. Lungs clear to auscultation bilaterally.   No wheezes, crackles, or rhonchi.  Cardiovascular: Normal S1, S2. No MRG. Regular rate and rhythm. No peripheral edema, cyanosis or pallor.  Gastrointestinal:  Soft, nondistended, mild epigastric ttp. No rebound or guarding. Normal bowel sounds. No appreciable masses or hepatomegaly. Rectal:  Not performed.  Psychiatric: Demonstrates good judgement and reason without abnormal affect or behaviors.  RELEVANT LABS AND IMAGING: CBC    Component Value Date/Time   WBC 13.2 (H) 09/18/2022 0947   WBC 10.0 11/07/2020 0838   RBC 4.81 09/18/2022 0947   RBC 4.28 11/07/2020 0838   HGB 14.0 09/18/2022 0947   HCT 43.1 09/18/2022 0947   PLT 440 09/18/2022 0947   MCV 90 09/18/2022 0947   MCH 29.1 09/18/2022 0947  MCH 29.7 11/07/2020 0838   MCHC 32.5 09/18/2022 0947   MCHC 32.8 11/07/2020 0838    RDW 11.8 09/18/2022 0947   LYMPHSABS 3.0 09/18/2022 0947   MONOABS 0.4 08/01/2020 1403   EOSABS 0.0 09/18/2022 0947   BASOSABS 0.0 09/18/2022 0947    CMP     Component Value Date/Time   NA 141 08/01/2022 1105   K 4.8 08/01/2022 1105   CL 104 08/01/2022 1105   CO2 21 08/01/2022 1105   GLUCOSE 75 08/01/2022 1105   GLUCOSE 102 (H) 08/01/2020 1403   BUN 7 08/01/2022 1105   CREATININE 0.60 08/01/2022 1105   CALCIUM 9.9 08/01/2022 1105   PROT 6.9 08/01/2022 1105   ALBUMIN 4.4 08/01/2022 1105   AST 12 08/01/2022 1105   ALT 8 08/01/2022 1105   ALKPHOS 46 08/01/2022 1105   BILITOT 0.3 08/01/2022 1105   GFRNONAA >60 06/04/2015 1412   GFRAA >60 06/04/2015 1412    Assessment: 1.  Epigastric pain: With some bloating and nausea, some better with Pepcid 40 mg at night, previous extensive evaluation and thought to be likely functional in nature 2.  IBS-C: Controlled with Motegrity daily  Plan: 1.  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  At this point Pepcid seems to be helping, we will increase this to 40 mg twice daily, every morning and nightly #60 with 11 refills. 2.  Continue Motegrity for constipation 3.  Patient will call or message me on MyChart in the next 2 to 3 weeks to let me know how she is doing, if no better would start Amitriptyline 25 mg nightly 4.  Patient will have follow-up with me in 3 to 4 months.  Lydia Meeker, PA-C [facility] 12/10/2022, 11:02 AM  Cc: Tollie Eth, NP

## 2022-12-10 NOTE — Progress Notes (Signed)
Addendum: Reviewed and agree with assessment and management plan. Pyrtle, Jay M, MD  

## 2022-12-10 NOTE — Patient Instructions (Addendum)
We have sent the following medications to your pharmacy for you to pick up at your convenience: Famotidine   Call or send a message on MyChart in 2 weeks to let me know if medication is not helping  You are scheduled for a follow up visit on 03/12/23 at 11:30 am   If your blood pressure at your visit was 140/90 or greater, please contact your primary care physician to follow up on this.  _______________________________________________________  If you are age 42 or older, your body mass index should be between 23-30. Your Body mass index is 32.47 kg/m. If this is out of the aforementioned range listed, please consider follow up with your Primary Care Provider.  If you are age 47 or younger, your body mass index should be between 19-25. Your Body mass index is 32.47 kg/m. If this is out of the aformentioned range listed, please consider follow up with your Primary Care Provider.   ________________________________________________________  The [facility] providers would like to encourage you to use [facility] to communicate with providers for non-urgent requests or questions.  Due to long hold times on the telephone, sending your provider a message by [facility] may be a faster and more efficient way to get a response.  Please allow 48 business hours for a response.  Please remember that this is for non-urgent requests.  _______________________________________________________   Thank you for entrusting me with your care and choosing [facility].  Hyacinth Meeker PA-C

## 2022-12-18 ENCOUNTER — Encounter (INDEPENDENT_AMBULATORY_CARE_PROVIDER_SITE_OTHER): Payer: Medicaid Other | Admitting: Psychiatry

## 2022-12-18 DIAGNOSIS — G43119 Migraine with aura, intractable, without status migrainosus: Secondary | ICD-10-CM | POA: Diagnosis not present

## 2022-12-23 ENCOUNTER — Ambulatory Visit: Payer: Medicaid Other | Admitting: Family Medicine

## 2022-12-23 ENCOUNTER — Other Ambulatory Visit: Payer: Self-pay | Admitting: Psychiatry

## 2022-12-23 MED ORDER — METHYLPREDNISOLONE 4 MG PO TBPK: ORAL_TABLET | ORAL | 0 refills | Status: DC

## 2022-12-23 NOTE — Telephone Encounter (Signed)
Please see the MyChart message reply(ies) for my assessment and plan.    This patient gave consent for this Medical Advice Message and is aware that it may result in a bill to [employer], as well as the possibility of receiving a bill for a co-payment or deductible. They are an established patient, but are not seeking medical advice exclusively about a problem treated during an in person or video visit in the last seven days. I did not recommend an in person or video visit within seven days of my reply.    I spent a total of 5 minutes cumulative time within 7 days through [employer].  Ocie Doyne, MD  12/23/22 4:15 PM

## 2022-12-24 NOTE — Progress Notes (Unsigned)
   CC:  headaches  Follow-up Visit  Last visit: 05/15/22  Brief HPI: 37 year old female with a history of vitamin D deficiency, viral meningitis (2006) who follows in clinic for headaches which began at age 16 following viral meningitis. 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 Ophthalmology exam 07/24/21 was negative for papilledema.   At her last visit she was continued on Ajovy for prevention. Nurtec was started for rescue.  Interval History: Headaches***Ajovy***Nurtec   Headache days per month: *** Migraine days per month*** Headache free days per month: ***  Current Headache Regimen: Preventative: *** Abortive: ***   Prior Therapies                                  Preventive: Diamox Topamax 200 mg QHS Amitriptyline 50 mg QHS Zoloft 75 mg daily Emgality 120 mg monthly Ajovy 225 mg monthly   Rescue: Zofran Tylenol Ibuprofen Imitrex 50 mg PRN - lack of efficacy Maxalt 10 mg PRN - lack of efficacy    Physical Exam:   Vital Signs: LMP 11/19/2022  GENERAL:  well appearing, in no acute distress, alert  SKIN:  Color, texture, turgor normal. No rashes or lesions HEAD:  Normocephalic/atraumatic. RESP: normal respiratory effort MSK:  No gross joint deformities.   NEUROLOGICAL: Mental Status: Alert, oriented to person, place and time, Follows commands, and Speech fluent and appropriate. Cranial Nerves: PERRL, face symmetric, no dysarthria, hearing grossly intact Motor: moves all extremities equally Gait: normal-based.  IMPRESSION: ***  PLAN: ***   Follow-up: ***  I spent a total of *** minutes on the date of the service. Headache education was done. Discussed lifestyle modification including increased oral hydration, decreased caffeine, exercise and stress management. Discussed treatment options including preventive and acute medications, natural supplements, and infusion therapy. Discussed medication  overuse headache and to limit use of acute treatments to no more than 2 days/week or 10 days/month. Discussed medication side effects, adverse reactions and drug interactions. Written educational materials and patient instructions outlining all of the above were given.  Ocie Doyne, MD

## 2022-12-25 ENCOUNTER — Ambulatory Visit: Payer: Medicaid Other | Admitting: Psychiatry

## 2022-12-25 ENCOUNTER — Encounter: Payer: Self-pay | Admitting: Psychiatry

## 2022-12-25 VITALS — BP 103/69 | HR 77 | Ht 60.0 in | Wt 163.4 lb

## 2022-12-25 DIAGNOSIS — G43711 Chronic migraine without aura, intractable, with status migrainosus: Secondary | ICD-10-CM | POA: Diagnosis not present

## 2022-12-25 MED ORDER — AJOVY 225 MG/1.5ML ~~LOC~~ SOAJ: 1.0000 | SUBCUTANEOUS | 11 refills | Status: DC

## 2022-12-25 MED ORDER — KETOROLAC TROMETHAMINE 60 MG/2ML IM SOLN
60.0000 mg | Freq: Once | INTRAMUSCULAR | Status: AC
Start: 2022-12-25 — End: 2022-12-25
  Administered 2022-12-25: 60 mg via INTRAMUSCULAR

## 2022-12-25 MED ORDER — NURTEC 75 MG PO TBDP: 75.0000 mg | ORAL_TABLET | ORAL | 11 refills | Status: DC

## 2023-01-10 ENCOUNTER — Encounter: Payer: Medicaid Other | Admitting: Nurse Practitioner

## 2023-01-15 ENCOUNTER — Ambulatory Visit (INDEPENDENT_AMBULATORY_CARE_PROVIDER_SITE_OTHER): Payer: Medicaid Other

## 2023-01-15 ENCOUNTER — Other Ambulatory Visit (HOSPITAL_COMMUNITY)
Admission: RE | Admit: 2023-01-15 | Discharge: 2023-01-15 | Disposition: A | Discharge status: Home / Self Care | Payer: Medicaid Other | Source: Ambulatory Visit | Attending: Obstetrics and Gynecology | Admitting: Obstetrics and Gynecology

## 2023-01-15 DIAGNOSIS — N898 Other specified noninflammatory disorders of vagina: Secondary | ICD-10-CM | POA: Diagnosis present

## 2023-01-15 DIAGNOSIS — R300 Dysuria: Secondary | ICD-10-CM | POA: Diagnosis not present

## 2023-01-15 LAB — POCT URINALYSIS DIPSTICK
Bilirubin, UA: NEGATIVE
Blood, UA: NEGATIVE
Glucose, UA: NEGATIVE
Nitrite, UA: NEGATIVE
Odor: NEGATIVE
Protein, UA: NEGATIVE
Spec Grav, UA: >=1.03 — AB (ref 1.010–1.025)
Urobilinogen, UA: 0.2 U/dL
pH, UA: 6 (ref 5.0–8.0)

## 2023-01-15 MED ORDER — ONDANSETRON 4 MG PO TBDP: ORAL_TABLET | ORAL | 1 refills | Status: AC

## 2023-01-15 NOTE — Progress Notes (Signed)
..  SUBJECTIVE:  37 y.o. female complains of vaginal itching/irritation for 2 week(s). Denies abnormal vaginal bleeding or significant pelvic pain or fever. No UTI symptoms. Denies history of known exposure to STD.  No LMP recorded.  OBJECTIVE:  She appears well, afebrile. Urine dipstick: positive for leukocytes and positive for ketones.  ASSESSMENT:  Vaginal Itching/irritation    PLAN:  GC, chlamydia, trichomonas, BVAG, CVAG probe sent to lab. Treatment: To be determined once lab results are received ROV prn if symptoms persist or worsen.

## 2023-01-16 LAB — CERVICOVAGINAL ANCILLARY ONLY
Bacterial Vaginitis (gardnerella): NEGATIVE
Candida Glabrata: NEGATIVE
Candida Vaginitis: NEGATIVE
Chlamydia: NEGATIVE
Comment: NEGATIVE
Comment: NEGATIVE
Comment: NEGATIVE
Comment: NEGATIVE
Comment: NEGATIVE
Comment: NORMAL
Neisseria Gonorrhea: NEGATIVE
Trichomonas: NEGATIVE

## 2023-01-17 LAB — URINE CULTURE

## 2023-01-18 ENCOUNTER — Encounter: Payer: Self-pay | Admitting: Obstetrics

## 2023-01-19 ENCOUNTER — Encounter (INDEPENDENT_AMBULATORY_CARE_PROVIDER_SITE_OTHER): Payer: Self-pay | Admitting: Psychiatry

## 2023-01-19 DIAGNOSIS — G43119 Migraine with aura, intractable, without status migrainosus: Secondary | ICD-10-CM

## 2023-01-19 DIAGNOSIS — R42 Dizziness and giddiness: Secondary | ICD-10-CM

## 2023-01-21 ENCOUNTER — Other Ambulatory Visit: Payer: Self-pay

## 2023-01-21 DIAGNOSIS — N898 Other specified noninflammatory disorders of vagina: Secondary | ICD-10-CM

## 2023-01-21 MED ORDER — FLUCONAZOLE 150 MG PO TABS
150.0000 mg | ORAL_TABLET | Freq: Once | ORAL | 0 refills | Status: AC
Start: 2023-01-21 — End: 2023-01-21

## 2023-01-22 ENCOUNTER — Other Ambulatory Visit: Payer: Self-pay | Admitting: Psychiatry

## 2023-01-22 MED ORDER — MECLIZINE HCL 12.5 MG PO TABS: 12.5000 mg | ORAL_TABLET | Freq: Three times a day (TID) | ORAL | 0 refills | Status: DC | PRN

## 2023-01-22 NOTE — Telephone Encounter (Signed)
Please see the MyChart message reply(ies) for my assessment and plan.    This patient gave consent for this Medical Advice Message and is aware that it may result in a bill to [employer], as well as the possibility of receiving a bill for a co-payment or deductible. They are an established patient, but are not seeking medical advice exclusively about a problem treated during an in person or video visit in the last seven days. I did not recommend an in person or video visit within seven days of my reply.    I spent a total of 5 minutes cumulative time within 7 days through [employer].  Ocie Doyne, MD  01/22/23 9:25 AM

## 2023-01-29 ENCOUNTER — Encounter: Payer: Self-pay | Admitting: Orthopedic Surgery

## 2023-01-29 ENCOUNTER — Ambulatory Visit (INDEPENDENT_AMBULATORY_CARE_PROVIDER_SITE_OTHER): Payer: Medicaid Other | Admitting: Orthopedic Surgery

## 2023-01-29 DIAGNOSIS — M545 Low back pain, unspecified: Secondary | ICD-10-CM

## 2023-01-29 MED ORDER — PREDNISONE 5 MG (21) PO TBPK: ORAL_TABLET | ORAL | 0 refills | Status: DC

## 2023-01-29 NOTE — Progress Notes (Signed)
Office Visit Note   Patient: Lydia [name]           Date of Birth: [birth year]           MRN: [id number] Visit Date: 01/29/2023 Requested by: Tollie Eth, NP [address] [place],  [place] [address] PCP: Tollie Eth, NP  Subjective: Chief Complaint  Patient presents with   Other    Bilateral leg/knee pain    HPI: Lydia [name] is a 37 y.o. female who presents to the office reporting bilateral lower extremity weakness and pain.  Starts at the knees and goes down to the feet.  Reports just pain and that her legs feel heavy.  Standing hurts her as well.  Ibuprofen helps her symptoms.  Denies any arm symptoms.  Had MRI lumbar spine March of this year which was essentially normal..                ROS: All systems reviewed are negative as they relate to the chief complaint within the history of present illness.  Patient denies fevers or chills.  Assessment & Plan: Visit Diagnoses:  1. Low back pain, unspecified back pain laterality, unspecified chronicity, unspecified whether sciatica present     Plan: Impression is 3-week history of bilateral leg and lower extremity pain below the knees.  Exam unremarkable.  Medrol Dosepak prescribed.  Could consider diagnostic and therapeutic epidural steroid injection for her symptoms and if that does not help then she would need referral to neurology.  Follow-Up Instructions: No follow-ups on file.   Orders:  No orders of the defined types were placed in this encounter.  Meds ordered this encounter  Medications   predniSONE (STERAPRED UNI-PAK 21 TAB) 5 MG (21) TBPK tablet    Sig: Take dosepak as directed    Dispense:  21 tablet    Refill:  0      Procedures: No procedures performed   Clinical Data: No additional findings.  Objective: Vital Signs: There were no vitals taken for this visit.  Physical Exam:  Constitutional: Patient appears well-developed HEENT:  Head: Normocephalic Eyes:EOM are normal Neck: Normal range  of motion Cardiovascular: Normal rate Pulmonary/chest: Effort normal Neurologic: Patient is alert Skin: Skin is warm Psychiatric: Patient has normal mood and affect  Ortho Exam: Ortho exam demonstrates 5 out of 5 ankle dorsiflexion plantarflexion quad hamstring strength.  Mildly positive nerve root tension signs bilaterally.  Reflexes symmetric bilateral patella and Achilles.  Reflexes 0-1+ bilateral patella and Achilles.  Specialty Comments:  No specialty comments available.  Imaging: No results found.   PMFS History: Patient Active Problem List   Diagnosis Date Noted   Tick bite of left front wall of thorax 12/08/2022   Urticaria of unknown origin 10/08/2022   Upper respiratory tract infection 07/15/2022   Dizziness 07/15/2022   Reflux pharyngitis 04/02/2022   Chronic cystitis 12/17/2021   Somatic dysfunction of spine, lumbar 09/28/2021   Insomnia 07/09/2021   Panic attack 06/06/2021   Paresthesia 06/06/2021   Generalized anxiety disorder 04/20/2021   Chronic daily headache 04/20/2021   Chronic idiopathic constipation 03/29/2021   Family history of congenital anomalies    Dyspareunia, female 03/11/2014   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   Past Medical History:  Diagnosis Date   Abdominal pain, epigastric 01/26/2014   Acute recurrent maxillary sinusitis 03/11/2014   Allergy    Anxiety    Chronic constipation    Motegrity helps    Chronic headaches    none x  2-3 yrs   GERD (gastroesophageal reflux disease)    HEADACHE 11/30/2007   Qualifier: Diagnosis of  By: Aleene Davidson MD, Merrily Pew    Infection    UTI   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/22/2020   Meningitis    Ovarian cyst    Pain 12/08/2013   Dyspareunia    Pelvic pain in female 07/06/2013   Right-sided back pain 09/28/2021   Strep throat 01/23/2022   Tinea versicolor 12/08/2013   Viral pharyngitis 04/02/2022    Family History  Problem Relation Age of Onset   Migraines Mother    Hypertension Mother    Asthma  Father    Birth defects Daughter        Limb reduction defect   Breast cancer Maternal Aunt 42   Colon cancer Neg Hx    Esophageal cancer Neg Hx    Rectal cancer Neg Hx    Stomach cancer Neg Hx    Colon polyps Neg Hx     Past Surgical History:  Procedure Laterality Date   CHOLECYSTECTOMY  2016   COLONOSCOPY     LAPAROSCOPIC APPENDECTOMY N/A 11/10/2020   Procedure: LAPAROSCOPIC APPENDECTOMY;  Surgeon: Rodman Pickle, MD;  Location: [facility];  Service: General;  Laterality: N/A;   UPPER GASTROINTESTINAL ENDOSCOPY  2015   WISDOM TOOTH EXTRACTION     Social History   Occupational History   Occupation: Stay at home mom  Tobacco Use   Smoking status: Never   Smokeless tobacco: Never  Vaping Use   Vaping status: Never Used  Substance and Sexual Activity   Alcohol use: Not Currently    Comment: occ   Drug use: No   Sexual activity: Yes    Partners: Male    Birth control/protection: Pill, OCP

## 2023-02-05 ENCOUNTER — Encounter: Payer: Self-pay | Admitting: Nurse Practitioner

## 2023-02-05 ENCOUNTER — Ambulatory Visit (INDEPENDENT_AMBULATORY_CARE_PROVIDER_SITE_OTHER): Payer: Medicaid Other | Admitting: Nurse Practitioner

## 2023-02-05 VITALS — BP 124/80 | HR 74 | Ht 60.0 in | Wt 163.8 lb

## 2023-02-05 DIAGNOSIS — K219 Gastro-esophageal reflux disease without esophagitis: Secondary | ICD-10-CM | POA: Diagnosis not present

## 2023-02-05 DIAGNOSIS — Z23 Encounter for immunization: Secondary | ICD-10-CM

## 2023-02-05 DIAGNOSIS — Z0000 Encounter for general adult medical examination without abnormal findings: Secondary | ICD-10-CM | POA: Diagnosis not present

## 2023-02-05 DIAGNOSIS — R1013 Epigastric pain: Secondary | ICD-10-CM

## 2023-02-05 DIAGNOSIS — F411 Generalized anxiety disorder: Secondary | ICD-10-CM

## 2023-02-05 MED ORDER — FAMOTIDINE 40 MG PO TABS
40.0000 mg | ORAL_TABLET | Freq: Every day | ORAL | Status: DC | PRN
Start: 2023-02-05 — End: 2023-07-22

## 2023-02-05 MED ORDER — PANTOPRAZOLE SODIUM 40 MG PO TBEC
40.0000 mg | DELAYED_RELEASE_TABLET | Freq: Every day | ORAL | 3 refills | Status: DC
Start: 2023-02-05 — End: 2023-05-21

## 2023-02-05 NOTE — Assessment & Plan Note (Signed)
Ongoing epigastric tenderness, abdominal bloating, episodes of reflux, and nausea despite BID famotidine 40mg . Given her ongoing symptoms and discomfort, we discussed trial of pantoprazole daily with option to continue famotidine for breakthrough symptoms. She is in agreement to this. Will send script to pharmacy. She will trial for 1-2 months and then consider taper if symptoms are improved.

## 2023-02-05 NOTE — Patient Instructions (Signed)
I have sent pantoprazole (Protonix) in for you to use daily for the reflux and stomach pain. You can still take the famotidine as needed when you have breakthrough symptoms or the rash comes up.   There are no preventive care reminders to display for this patient.   For all adult patients, I recommend A well balanced diet low in saturated fats, cholesterol, and moderation in carbohydrates.   This can be as simple as monitoring portion sizes and cutting back on sugary beverages such as soda and juice to start with.    Daily water consumption of at least 64 ounces.  Physical activity at least 180 minutes per week, if just starting out.   This can be as simple as taking the stairs instead of the elevator and walking 2-3 laps around the office  purposefully every day.   STD protection, partner selection, and regular testing if high risk.  Limited consumption of alcoholic beverages if alcohol is consumed.  For women, I recommend no more than 7 alcoholic beverages per week, spread out throughout the week.  Avoid "binge" drinking or consuming large quantities of alcohol in one setting.   Please let me know if you feel you may need help with reduction or quitting alcohol consumption.   Avoidance of nicotine, if used.  Please let me know if you feel you may need help with reduction or quitting nicotine use.   Daily mental health attention.  This can be in the form of 5 minute daily meditation, prayer, journaling, yoga, reflection, etc.   Purposeful attention to your emotions and mental state can significantly improve your overall wellbeing  and  Health.  Please know that I am here to help you with all of your health care goals and am happy to work with you to find a solution that works best for you.  The greatest advice I have received with any changes in life are to take it one step at a time, that even means if all you can focus on is the next 60 seconds, then do that and celebrate your  victories.  With any changes in life, you will have set backs, and that is OK. The important thing to remember is, if you have a set back, it is not a failure, it is an opportunity to try again!  Health Maintenance Recommendations Screening Testing Mammogram Every 1 -2 years based on history and risk factors Starting at age 37 Pap Smear Ages 21-39 every 3 years Ages 32-65 every 5 years with HPV testing More frequent testing may be required based on results and history Colon Cancer Screening Every 1-10 years based on test performed, risk factors, and history Starting at age 52 Bone Density Screening Every 2-10 years based on history Starting at age 38 for women Recommendations for men differ based on medication usage, history, and risk factors AAA Screening One time ultrasound Men 59-15 years old who have every smoked Lung Cancer Screening Low Dose Lung CT every 12 months Age 56-80 years with a 30 pack-year smoking history who still smoke or who have quit within the last 15 years  Screening Labs Routine  Labs: Complete Blood Count (CBC), Complete Metabolic Panel (CMP), Cholesterol (Lipid Panel) Every 6-12 months based on history and medications May be recommended more frequently based on current conditions or previous results Hemoglobin A1c Lab Every 3-12 months based on history and previous results Starting at age 59 or earlier with diagnosis of diabetes, high cholesterol, BMI >26, and/or risk  factors Frequent monitoring for patients with diabetes to ensure blood sugar control Thyroid Panel (TSH w/ T3 & T4) Every 6 months based on history, symptoms, and risk factors May be repeated more often if on medication HIV One time testing for all patients 26 and older May be repeated more frequently for patients with increased risk factors or exposure Hepatitis C One time testing for all patients 72 and older May be repeated more frequently for patients with increased risk factors or  exposure Gonorrhea, Chlamydia Every 12 months for all sexually active persons 13-24 years Additional monitoring may be recommended for those who are considered high risk or who have symptoms PSA Men 49-63 years old with risk factors Additional screening may be recommended from age 45-69 based on risk factors, symptoms, and history  Vaccine Recommendations Tetanus Booster All adults every 10 years Flu Vaccine All patients 6 months and older every year COVID Vaccine All patients 12 years and older Initial dosing with booster May recommend additional booster based on age and health history HPV Vaccine 2 doses all patients age 89-26 Dosing may be considered for patients over 26 Shingles Vaccine (Shingrix) 2 doses all adults 55 years and older Pneumonia (Pneumovax 23) All adults 65 years and older May recommend earlier dosing based on health history Pneumonia (Prevnar 70) All adults 65 years and older Dosed 1 year after Pneumovax 23  Additional Screening, Testing, and Vaccinations may be recommended on an individualized basis based on family history, health history, risk factors, and/or exposure.

## 2023-02-05 NOTE — Progress Notes (Signed)
[name], DNP, AGNP-c [facility] [address] [place], [place] [address] Main Office [phone]  BP 124/80   Pulse 74   Ht 5' (1.524 m)   Wt 163 lb 12.8 oz (74.3 kg)   LMP 01/22/2023   BMI 31.99 kg/m    Subjective:    Patient ID: Lydia [name], female    DOB: 1986/05/02, 37 y.o.   MRN: [id number]  HPI: Lydia [name] is a 37 y.o. female presenting on 02/05/2023 for comprehensive medical examination.   Current medical concerns include: None today  Pertinent items are noted in HPI.  IMMUNIZATIONS:   Flu Vaccine: Flu vaccine given today Prevnar 13: Prevnar 13 N/A for this patient Prevnar 20: Prevnar 20 N/A for this patient Pneumovax 23: Pneumovax 23 N/A for this patient Vac Shingrix: Shingrix N/A for this patient HPV: N/A or Aged Out Tetanus: Tetanus completed in the last 10 years COVID: Declined, patient does not wish to have this vaccine RSV: No  HEALTH MAINTENANCE: Pap Smear HM Status: is up to date Mammogram HM Status: N/A Colon Cancer Screening HM Status: N/A Bone Density HM Status: N/A STI Testing HM Status: was declined  Lung CT HM Status: N/A  Concerns with vision, hearing, or dentition: No  Dietary Habits: Regular diet Exercise:Active lifestyle, but no regular exercise  Most Recent Depression Screen:     02/05/2023   10:34 AM 04/02/2022   11:07 AM 01/23/2022    8:15 AM 09/12/2021    9:57 AM 08/20/2021    3:01 PM  Depression screen PHQ 2/9  Decreased Interest 0 0 0 1 0  Down, Depressed, Hopeless 0 0 0 0 0  PHQ - 2 Score 0 0 0 1 0  Altered sleeping  0 1 0   Tired, decreased energy  0 1 0   Change in appetite  0 0 0   Feeling bad or failure about yourself    0 0   Trouble concentrating  0 0 0   Moving slowly or fidgety/restless  0 0 1   Suicidal thoughts  0 0 0   PHQ-9 Score  0 2 2   Difficult doing work/chores  Not difficult at all Not difficult at all     Most Recent Anxiety Screen:     04/02/2022   11:07 AM 09/12/2021     9:58 AM 05/22/2021   10:47 AM 05/22/2021   10:01 AM  GAD 7 : Generalized Anxiety Score  Nervous, Anxious, on Edge 0 0 2 0  Control/stop worrying 0 0 0 0  Worry too much - different things 0 2 1 1   Trouble relaxing 0 1 0 0  Restless 0 0 0 0  Easily annoyed or irritable 0 0 2 0  Afraid - awful might happen 0 0 0 0  Total GAD 7 Score 0 3 5 1   Anxiety Difficulty Not difficult at all  Somewhat difficult    Most Recent Fall Screen:    02/05/2023   10:34 AM 04/02/2022   11:06 AM 01/23/2022    8:15 AM 08/20/2021    3:01 PM 05/22/2021   10:00 AM  Fall Risk   Falls in the past year? 0 0 0 0 0  Number falls in past yr: 0 0 0 0 0  Injury with Fall? 0 0 0 0 0  Risk for fall due to : No Fall Risks No Fall Risks No Fall Risks No Fall Risks   Follow up Falls evaluation completed Falls evaluation  completed Falls evaluation completed;Education provided Falls evaluation completed;Education provided Falls evaluation completed    Past medical history, surgical history, medications, allergies, family history and social history reviewed with patient today and changes made to appropriate areas of the chart.  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   hydrOXYzine (VISTARIL) 50 MG capsule Take 1 capsule (50 mg total) by mouth at bedtime and may repeat dose one time if needed. For sleep and anxiety.   JUNEL FE 1.5/30 1.5-30 MG-MCG tablet Take 1 tablet by mouth daily.   meclizine (ANTIVERT) 12.5 MG tablet Take 1 tablet (12.5 mg total) by mouth 3 (three) times daily as needed for dizziness.   ondansetron (ZOFRAN-ODT) 4 MG disintegrating tablet Take 1 tablet (4 mg total) by mouth every 4-6 hours as needed for nausea or vomiting.   Prucalopride Succinate (MOTEGRITY) 2 MG TABS Take 1 tablet (2 mg total) by mouth daily.   Rimegepant Sulfate (NURTEC) 75 MG TBDP Take 1 tablet (75 mg total) by mouth every other day. Take one pill at onset of migraine. Max dose 1 pill in 24 hours   triamcinolone cream (KENALOG) 0.1 % Apply 1 Application topically 2 (two) times daily.   No current facility-administered medications on file prior to visit.   Surgical History:  Past Surgical History:  Procedure Laterality Date   CHOLECYSTECTOMY  2016   COLONOSCOPY     LAPAROSCOPIC APPENDECTOMY N/A 11/10/2020   Procedure: LAPAROSCOPIC APPENDECTOMY;  Surgeon: Sheliah Hatch De Blanch, MD;  Location: [facility];  Service: General;  Laterality: N/A;   UPPER GASTROINTESTINAL ENDOSCOPY  2015   WISDOM TOOTH EXTRACTION     Allergies:  Allergies  Allergen Reactions   Penicillins Hives, Itching and Rash    Has patient had a PCN reaction causing immediate rash, facial/tongue/throat swelling, SOB or lightheadedness with hypotension: Yes Has patient had a PCN reaction causing severe rash involving mucus membranes or skin necrosis: Yes Has patient had a PCN reaction that required hospitalization no Has patient had a PCN reaction occurring within the last 10 years: No If all of the above answers are "NO", then may  proceed with Cephalosporin use. Has patient had a PCN reaction causing immediate rash, facial/tongue/throat swelling, SOB or lightheadedness with hypotension: Yes Has patient had a PCN reaction causing severe rash involving mucus membranes or skin necrosis: Yes Has patient had a PCN reaction that required hospitalization no  Has patient had a PCN reaction occurring within the last 10 years: No If all of the above answers are "NO", then may proceed with Cephalosporin use.   Family History:  Family History  Problem Relation Age of Onset   Migraines Mother    Hypertension Mother    Asthma Father    Birth defects Daughter        Limb reduction defect   Breast cancer Maternal Aunt 21   Colon cancer Neg Hx    Esophageal cancer Neg Hx    Rectal cancer Neg Hx    Stomach cancer Neg Hx    Colon polyps Neg Hx        Objective:    BP 124/80   Pulse 74   Ht 5' (1.524 m)   Wt 163 lb 12.8 oz (74.3 kg)   LMP 01/22/2023   BMI 31.99 kg/m   Wt Readings from Last 3 Encounters:  02/05/23 163 lb 12.8 oz (74.3 kg)  12/25/22 163 lb 6.4 oz (74.1 kg)  12/10/22 163 lb 8 oz (74.2 kg)    Physical Exam Vitals and nursing note reviewed.  Constitutional:      General: She is not in acute distress.    Appearance: Normal appearance.  HENT:     Head: Normocephalic and atraumatic.     Right Ear: Hearing, tympanic membrane, ear canal and external ear normal.     Left Ear: Hearing, tympanic membrane, ear canal and external ear normal.     Nose: Nose normal.     Right Sinus: No maxillary sinus tenderness or frontal sinus tenderness.     Left Sinus: No maxillary sinus tenderness or frontal sinus tenderness.     Mouth/Throat:     Lips: Pink.     Mouth: Mucous membranes are moist.     Pharynx: Oropharynx is clear.  Eyes:     General: Lids are normal. Vision grossly intact.     Extraocular Movements: Extraocular movements intact.     Conjunctiva/sclera: Conjunctivae normal.     Pupils: Pupils are  equal, round, and reactive to light.     Funduscopic exam:    Right eye: Red reflex present.        Left eye: Red reflex present.    Visual Fields: Right eye visual fields normal and left eye visual fields normal.  Neck:     Thyroid: No thyromegaly.     Vascular: No carotid bruit.  Cardiovascular:     Rate and Rhythm: Normal rate and regular rhythm.     Chest Wall: PMI is not displaced.     Pulses: Normal pulses.          Dorsalis pedis pulses are 2+ on the right side and 2+ on the left side.       Posterior tibial pulses are 2+ on the right side and 2+ on the left side.     Heart sounds: Normal heart sounds. No murmur heard. Pulmonary:     Effort: Pulmonary effort is normal. No respiratory distress.     Breath sounds: Normal breath sounds.  Abdominal:     General: Abdomen is flat. Bowel sounds are normal. There is no distension.     Palpations: Abdomen is soft. There is no hepatomegaly, splenomegaly or mass.     Tenderness: There is abdominal tenderness in the epigastric area. There is no right CVA tenderness, left CVA tenderness, guarding or rebound.  Musculoskeletal:        General: Normal range of  motion.     Cervical back: Full passive range of motion without pain, normal range of motion and neck supple. No tenderness.     Right lower leg: No edema.     Left lower leg: No edema.  Feet:     Left foot:     Toenail Condition: Left toenails are normal.  Lymphadenopathy:     Cervical: No cervical adenopathy.     Upper Body:     Right upper body: No supraclavicular adenopathy.     Left upper body: No supraclavicular adenopathy.  Skin:    General: Skin is warm and dry.     Capillary Refill: Capillary refill takes less than 2 seconds.     Nails: There is no clubbing.  Neurological:     General: No focal deficit present.     Mental Status: She is alert and oriented to person, place, and time.     GCS: GCS eye subscore is 4. GCS verbal subscore is 5. GCS motor subscore is 6.      Sensory: Sensation is intact.     Motor: Motor function is intact. No weakness.     Coordination: Coordination is intact.     Gait: Gait is intact.     Deep Tendon Reflexes: Reflexes are normal and symmetric.  Psychiatric:        Attention and Perception: Attention normal.        Mood and Affect: Mood normal.        Speech: Speech normal.        Behavior: Behavior normal. Behavior is cooperative.        Thought Content: Thought content normal.        Cognition and Memory: Cognition and memory normal.        Judgment: Judgment normal.     Results for orders placed or performed in visit on 01/15/23  Urine Culture   Specimen: Urine   UR  Result Value Ref Range   Urine Culture, Routine Final report    Organism ID, Bacteria Comment   POCT Urinalysis Dipstick  Result Value Ref Range   Color, UA yellow    Clarity, UA clear    Glucose, UA Negative Negative   Bilirubin, UA neg    Ketones, UA trace    Spec Grav, UA >=1.030 (A) 1.010 - 1.025   Blood, UA neg    pH, UA 6.0 5.0 - 8.0   Protein, UA Negative Negative   Urobilinogen, UA 0.2 0.2 or 1.0 E.U./dL   Nitrite, UA neg    Leukocytes, UA Trace (A) Negative   Appearance     Odor negative   Cervicovaginal ancillary only( [place])  Result Value Ref Range   Neisseria Gonorrhea Negative    Chlamydia Negative    Trichomonas Negative    Bacterial Vaginitis (gardnerella) Negative    Candida Vaginitis Negative    Candida Glabrata Negative    Comment      Normal Reference Range Bacterial Vaginosis - Negative   Comment Normal Reference Range Candida Species - Negative    Comment Normal Reference Range Candida Galbrata - Negative    Comment Normal Reference Range Trichomonas - Negative    Comment Normal Reference [place] Chlamydia - Negative    Comment      Normal Reference Range Neisseria Gonorrhea - Negative         Assessment & Plan:   Problem List Items Addressed This Visit     Encounter for annual physical exam -  Primary    CPE completed today. Review of HM activities and recommendations discussed and provided on AVS. Anticipatory guidance, diet, and exercise recommendations provided. Medications, allergies, and hx reviewed and updated as necessary. Orders placed as listed below.  Plan: - Labs ordered. Will make changes as necessary based on results.  - I will review these results and send recommendations via MyChart or a telephone call.  - F/U with CPE in 1 year or sooner for acute/chronic health needs as directed.        Relevant Orders   CBC with Differential/Platelet   CMP14+EGFR   Hemoglobin A1c   Lipid panel   Generalized anxiety disorder    Anxiety is well controlled at this time. She does have intermittent symptoms with repeat hives during these episodes. At this time I recommend use of hydroxyzine and famotidine for these periods to help reduce the anxiety and symptoms of hives. She will follow-up if her symptoms worsen.       Gastroesophageal reflux disease    Ongoing epigastric tenderness, abdominal bloating, episodes of reflux, and nausea despite BID famotidine 40mg . Given her ongoing symptoms and discomfort, we discussed trial of pantoprazole daily with option to continue famotidine for breakthrough symptoms. She is in agreement to this. Will send script to pharmacy. She will trial for 1-2 months and then consider taper if symptoms are improved.       Relevant Medications   famotidine (PEPCID) 40 MG tablet   pantoprazole (PROTONIX) 40 MG tablet   Other Visit Diagnoses     Need for immunization against influenza       Relevant Orders   Flu vaccine trivalent PF, 6mos and older(Flulaval,Afluria,Fluarix,Fluzone) (Completed)   Abdominal pain, epigastric       Relevant Medications   famotidine (PEPCID) 40 MG tablet   pantoprazole (PROTONIX) 40 MG tablet   Other Relevant Orders   CMP14+EGFR          Follow up plan: Return in about 1 year (around 02/05/2024) for CPE.  NEXT  PREVENTATIVE PHYSICAL DUE IN 1 YEAR.  PATIENT COUNSELING PROVIDED FOR ALL ADULT PATIENTS: A well balanced diet low in saturated fats, cholesterol, and moderation in carbohydrates.  This can be as simple as monitoring portion sizes and cutting back on sugary beverages such as soda and juice to start with.    Daily water consumption of at least 64 ounces.  Physical activity at least 180 minutes per week.  If just starting out, start 10 minutes a day and work your way up.   This can be as simple as taking the stairs instead of the elevator and walking 2-3 laps around the office  purposefully every day.   STD protection, partner selection, and regular testing if high risk.  Limited consumption of alcoholic beverages if alcohol is consumed. For men, I recommend no more than 14 alcoholic beverages per week, spread out throughout the week (max 2 per day). Avoid "binge" drinking or consuming large quantities of alcohol in one setting.  Please let me know if you feel you may need help with reduction or quitting alcohol consumption.   Avoidance of nicotine, if used. Please let me know if you feel you may need help with reduction or quitting nicotine use.   Daily mental health attention. This can be in the form of 5 minute daily meditation, prayer, journaling, yoga, reflection, etc.  Purposeful attention to your emotions and mental state can significantly improve your overall wellbeing  and  Health.  Please  know that I am here to help you with all of your health care goals and am happy to work with you to find a solution that works best for you.  The greatest advice I have received with any changes in life are to take it one step at a time, that even means if all you can focus on is the next 60 seconds, then do that and celebrate your victories.  With any changes in life, you will have set backs, and that is OK. The important thing to remember is, if you have a set back, it is not a failure, it is  an opportunity to try again! Screening Testing Mammogram Every 1 -2 years based on history and risk factors Starting at age 19 Pap Smear Ages 21-39 every 3 years Ages 13-65 every 5 years with HPV testing More frequent testing may be required based on results and history Colon Cancer Screening Every 1-10 years based on test performed, risk factors, and history Starting at age 59 Bone Density Screening Every 2-10 years based on history Starting at age 100 for women Recommendations for men differ based on medication usage, history, and risk factors AAA Screening One time ultrasound Men 68-2 years old who have every smoked Lung Cancer Screening Low Dose Lung CT every 12 months Age 58-80 years with a 30 pack-year smoking history who still smoke or who have quit within the last 15 years   Screening Labs Routine  Labs: Complete Blood Count (CBC), Complete Metabolic Panel (CMP), Cholesterol (Lipid Panel) Every 6-12 months based on history and medications May be recommended more frequently based on current conditions or previous results Hemoglobin A1c Lab Every 3-12 months based on history and previous results Starting at age 43 or earlier with diagnosis of diabetes, high cholesterol, BMI >26, and/or risk factors Frequent monitoring for patients with diabetes to ensure blood sugar control Thyroid Panel (TSH) Every 6 months based on history, symptoms, and risk factors May be repeated more often if on medication HIV One time testing for all patients 25 and older May be repeated more frequently for patients with increased risk factors or exposure Hepatitis C One time testing for all patients 95 and older May be repeated more frequently for patients with increased risk factors or exposure Gonorrhea, Chlamydia Every 12 months for all sexually active persons 13-24 years Additional monitoring may be recommended for those who are considered high risk or who have symptoms Every 12 months  for any woman on birth control, regardless of sexual activity PSA Men 82-60 years old with risk factors Additional screening may be recommended from age 4-69 based on risk factors, symptoms, and history  Vaccine Recommendations Tetanus Booster All adults every 10 years Flu Vaccine All patients 6 months and older every year COVID Vaccine All patients 12 years and older Initial dosing with booster May recommend additional booster based on age and health history HPV Vaccine 2 doses all patients age 39-26 Dosing may be considered for patients over 26 Shingles Vaccine (Shingrix) 2 doses all adults 55 years and older Pneumonia (Pneumovax 64) All adults 65 years and older May recommend earlier dosing based on health history One year apart from Prevnar 60 Pneumonia (Prevnar 43) All adults 65 years and older Dosed 1 year after Pneumovax 23 Pneumonia (Prevnar 20) One time alternative to the two dosing of 13 and 23 For all adults with initial dose of 23, 20 is recommended 1 year later For all adults with initial dose of 13, 68  is still recommended as second option 1 year later

## 2023-02-05 NOTE — Assessment & Plan Note (Signed)
Anxiety is well controlled at this time. She does have intermittent symptoms with repeat hives during these episodes. At this time I recommend use of hydroxyzine and famotidine for these periods to help reduce the anxiety and symptoms of hives. She will follow-up if her symptoms worsen.

## 2023-02-05 NOTE — Assessment & Plan Note (Signed)

## 2023-02-06 ENCOUNTER — Encounter: Payer: Self-pay | Admitting: Nurse Practitioner

## 2023-02-06 LAB — CBC WITH DIFFERENTIAL/PLATELET
Basophils Absolute: 0 10*3/uL (ref 0.0–0.2)
Basos: 0 %
EOS (ABSOLUTE): 0.1 10*3/uL (ref 0.0–0.4)
Eos: 1 %
Hematocrit: 41.5 % (ref 34.0–46.6)
Hemoglobin: 13.5 g/dL (ref 11.1–15.9)
Immature Grans (Abs): 0 10*3/uL (ref 0.0–0.1)
Immature Granulocytes: 0 %
Lymphocytes Absolute: 3.5 10*3/uL — ABNORMAL HIGH (ref 0.7–3.1)
Lymphs: 31 %
MCH: 29.4 pg (ref 26.6–33.0)
MCHC: 32.5 g/dL (ref 31.5–35.7)
MCV: 90 fL (ref 79–97)
Monocytes Absolute: 0.4 10*3/uL (ref 0.1–0.9)
Monocytes: 4 %
Neutrophils Absolute: 7 10*3/uL (ref 1.4–7.0)
Neutrophils: 64 %
Platelets: 409 10*3/uL (ref 150–450)
RBC: 4.59 x10E6/uL (ref 3.77–5.28)
RDW: 11.8 % (ref 11.7–15.4)
WBC: 11.1 10*3/uL — ABNORMAL HIGH (ref 3.4–10.8)

## 2023-02-06 LAB — CMP14+EGFR
ALT: 6 IU/L (ref 0–32)
AST: 13 IU/L (ref 0–40)
Albumin: 4.2 g/dL (ref 3.9–4.9)
Alkaline Phosphatase: 51 IU/L (ref 44–121)
BUN/Creatinine Ratio: 15 (ref 9–23)
BUN: 9 mg/dL (ref 6–20)
Bilirubin Total: 0.3 mg/dL (ref 0.0–1.2)
CO2: 23 mmol/L (ref 20–29)
Calcium: 9.7 mg/dL (ref 8.7–10.2)
Chloride: 100 mmol/L (ref 96–106)
Creatinine, Ser: 0.6 mg/dL (ref 0.57–1.00)
Globulin, Total: 2.7 g/dL (ref 1.5–4.5)
Glucose: 84 mg/dL (ref 70–99)
Potassium: 4.6 mmol/L (ref 3.5–5.2)
Sodium: 137 mmol/L (ref 134–144)
Total Protein: 6.9 g/dL (ref 6.0–8.5)
eGFR: 119 mL/min/{1.73_m2} (ref >59)

## 2023-02-06 LAB — LIPID PANEL
Chol/HDL Ratio: 5.1 ratio — ABNORMAL HIGH (ref 0.0–4.4)
Cholesterol, Total: 273 mg/dL — ABNORMAL HIGH (ref 100–199)
HDL: 54 mg/dL (ref >39)
LDL Chol Calc (NIH): 174 mg/dL — ABNORMAL HIGH (ref 0–99)
Triglycerides: 237 mg/dL — ABNORMAL HIGH (ref 0–149)
VLDL Cholesterol Cal: 45 mg/dL — ABNORMAL HIGH (ref 5–40)

## 2023-02-06 LAB — HEMOGLOBIN A1C
Est. average glucose Bld gHb Est-mCnc: 111 mg/dL
Hgb A1c MFr Bld: 5.5 % (ref 4.8–5.6)

## 2023-02-11 ENCOUNTER — Other Ambulatory Visit: Payer: Self-pay | Admitting: Obstetrics

## 2023-02-11 DIAGNOSIS — N9412 Deep dyspareunia: Secondary | ICD-10-CM

## 2023-02-20 ENCOUNTER — Encounter: Payer: Self-pay | Admitting: Nurse Practitioner

## 2023-02-21 ENCOUNTER — Other Ambulatory Visit: Payer: Self-pay

## 2023-02-21 MED ORDER — ATORVASTATIN CALCIUM 20 MG PO TABS: 20.0000 mg | ORAL_TABLET | Freq: Every day | ORAL | 1 refills | Status: DC

## 2023-03-04 ENCOUNTER — Other Ambulatory Visit: Payer: Self-pay | Admitting: Nurse Practitioner

## 2023-03-04 DIAGNOSIS — W57XXXA Bitten or stung by nonvenomous insect and other nonvenomous arthropods, initial encounter: Secondary | ICD-10-CM

## 2023-03-09 ENCOUNTER — Telehealth: Payer: Medicaid Other | Admitting: Family

## 2023-03-09 DIAGNOSIS — J069 Acute upper respiratory infection, unspecified: Secondary | ICD-10-CM | POA: Diagnosis not present

## 2023-03-09 MED ORDER — BENZONATATE 100 MG PO CAPS
100.0000 mg | ORAL_CAPSULE | Freq: Three times a day (TID) | ORAL | 0 refills | Status: DC | PRN
Start: 2023-03-09 — End: 2023-07-02

## 2023-03-09 MED ORDER — FLUTICASONE PROPIONATE 50 MCG/ACT NA SUSP
2.0000 | Freq: Every day | NASAL | 6 refills | Status: DC
Start: 2023-03-09 — End: 2023-07-02

## 2023-03-09 MED ORDER — CETIRIZINE HCL 10 MG PO TABS
10.0000 mg | ORAL_TABLET | Freq: Every day | ORAL | 1 refills | Status: DC
Start: 2023-03-09 — End: 2024-02-10

## 2023-03-09 NOTE — Progress Notes (Signed)

## 2023-03-12 ENCOUNTER — Ambulatory Visit: Payer: Medicaid Other | Admitting: Physician Assistant

## 2023-04-04 ENCOUNTER — Telehealth: Payer: Medicaid Other | Admitting: Family Medicine

## 2023-04-04 DIAGNOSIS — K649 Unspecified hemorrhoids: Secondary | ICD-10-CM

## 2023-04-05 MED ORDER — HYDROCORTISONE ACETATE 25 MG RE SUPP: 25.0000 mg | Freq: Two times a day (BID) | RECTAL | 0 refills | Status: DC

## 2023-04-05 NOTE — Progress Notes (Signed)

## 2023-05-08 ENCOUNTER — Telehealth: Payer: Medicaid Other | Admitting: Nurse Practitioner

## 2023-05-08 ENCOUNTER — Other Ambulatory Visit: Payer: Self-pay | Admitting: Nurse Practitioner

## 2023-05-08 DIAGNOSIS — S20362A Insect bite (nonvenomous) of left front wall of thorax, initial encounter: Secondary | ICD-10-CM

## 2023-05-08 DIAGNOSIS — W57XXXA Bitten or stung by nonvenomous insect and other nonvenomous arthropods, initial encounter: Secondary | ICD-10-CM

## 2023-05-08 NOTE — Telephone Encounter (Signed)
Please submit for a PA.

## 2023-05-09 ENCOUNTER — Telehealth: Payer: Self-pay | Admitting: Pharmacy Technician

## 2023-05-09 NOTE — Telephone Encounter (Signed)
Pharmacy Patient Advocate Encounter   Received notification from Pt Calls Messages that prior authorization for MOTEGRITY 2MG  is required/requested.   Insurance verification completed.   The patient is insured through [facility] .   Per test claim: PA required; PA submitted to above mentioned insurance via CoverMyMeds Key/confirmation #/EOC [facility] Status is pending

## 2023-05-09 NOTE — Telephone Encounter (Signed)
PA has been submitted, and telephone encounter has been created. 

## 2023-05-15 NOTE — Telephone Encounter (Signed)
 Pharmacy Patient Advocate Encounter  Received notification from [facility] that Prior Authorization for Motegrity 2MG  tablets has been APPROVED from 05-09-2023 to 05-08-2024   PA #/Case ID/Reference #: Audrea Muscat

## 2023-05-21 ENCOUNTER — Other Ambulatory Visit: Payer: Self-pay | Admitting: Nurse Practitioner

## 2023-05-21 DIAGNOSIS — K219 Gastro-esophageal reflux disease without esophagitis: Secondary | ICD-10-CM

## 2023-05-21 DIAGNOSIS — R1013 Epigastric pain: Secondary | ICD-10-CM

## 2023-05-26 ENCOUNTER — Telehealth: Payer: Medicaid Other | Admitting: Nurse Practitioner

## 2023-05-27 ENCOUNTER — Telehealth: Payer: Medicaid Other

## 2023-05-27 DIAGNOSIS — R52 Pain, unspecified: Secondary | ICD-10-CM

## 2023-05-28 MED ORDER — NAPROXEN 500 MG PO TABS
500.0000 mg | ORAL_TABLET | Freq: Two times a day (BID) | ORAL | 0 refills | Status: DC
Start: 2023-05-28 — End: 2023-07-02

## 2023-05-28 NOTE — Progress Notes (Signed)
 E visit for Flu like symptoms   We are sorry that you are not feeling well.  Here is how we plan to help! Based on what you have shared with me it looks like you may have flu-like symptoms that should be watched but do not seem to indicate anti-viral treatment.  Influenza or "the flu" is   an infection caused by a respiratory virus. The flu virus is highly contagious and persons who did not receive their yearly flu vaccination may "catch" the flu from close contact.  We have anti-viral medications to treat the viruses that cause this infection. They are not a "cure" and only shorten the course of the infection. These prescriptions are most effective when they are given within the first 2 days of "flu" symptoms. Antiviral medication are indicated if you have a high risk of complications from the flu.   Based upon your symptoms and potential risk factors I recommend that you follow the flu symptoms recommendation that I have listed below.  If you must be around other household members who do not have symptoms, you need to make sure that both you and the family members are masking consistently with a high-quality mask.  If you note any worsening of symptoms despite treatment, please seek an in-person evaluation ASAP. If you note any significant shortness of breath or any chest pain, please seek ER evaluation. Please do not delay care!  Go to the nearest hospital ED for assessment if fever/cough/breathlessness are severe or illness seems like a threat to life.    The following symptoms may appear 2-14 days after exposure: Fever Cough Shortness of breath or difficulty breathing Chills Repeated shaking with chills Muscle pain Headache Sore throat New loss of taste or smell Fatigue Congestion or runny nose Nausea or vomiting Diarrhea  You can use medication such as prescription anti-inflammatory called Naprosyn  500 mg. Take twice daily as needed for fever or body aches for 2 weeks  You may  also take acetaminophen  (Tylenol ) as needed for fever.  ANYONE WHO HAS FLU SYMPTOMS SHOULD: Stay home. The flu is highly contagious and going out or to work exposes others! Be sure to drink plenty of fluids. Water is fine as well as fruit juices, sodas and electrolyte beverages. You may want to stay away from caffeine or alcohol. If you are nauseated, try taking small sips of liquids. How do you know if you are getting enough fluid? Your urine should be a pale yellow or almost colorless. Get rest. Taking a steamy shower or using a humidifier may help nasal congestion and ease sore throat pain. Using a saline nasal spray works much the same way. Cough drops, hard candies and sore throat lozenges may ease your cough. Line up a caregiver. Have someone check on you regularly.   GET HELP RIGHT AWAY IF: You cannot keep down liquids or your medications. You become short of breath Your fell like you are going to pass out or loose consciousness. Your symptoms persist after you have completed your treatment plan MAKE SURE YOU  Understand these instructions. Will watch your condition. Will get help right away if you are not doing well or get worse.  Your e-visit answers were reviewed by a board certified advanced clinical practitioner to complete your personal care plan.  Depending on the condition, your plan could have included both over the counter or prescription medications.  If there is a problem please reply  once you have received a response from your provider.  Your safety is important to us .  If you have drug allergies check your prescription carefully.    You can use MyChart to ask questions about today's visit, request a non-urgent call back, or ask for a work or school excuse for 24 hours related to this e-Visit. If it has been greater than 24 hours you will need to follow up with your provider, or enter a new e-Visit to address those concerns.  You will get an e-mail in the next two  days asking about your experience.  I hope that your e-visit has been valuable and will speed your recovery. Thank you for using e-visits.   I have spent 5 minutes in review of e-visit questionnaire, review and updating patient chart, medical decision making and response to patient.   Angelia Kelp, PA-C

## 2023-05-29 ENCOUNTER — Telehealth: Payer: Self-pay | Admitting: Pharmacy Technician

## 2023-05-29 ENCOUNTER — Other Ambulatory Visit (HOSPITAL_COMMUNITY): Payer: Self-pay

## 2023-05-29 NOTE — Telephone Encounter (Signed)
Pharmacy Patient Advocate Encounter   Received notification from CoverMyMeds that prior authorization for Nurtec 75MG  dispersible tablets is required/requested.   Insurance verification completed.   The patient is insured through [facility] .   Per test claim: PA required; PA started via CoverMyMeds. KEY [id number] . Waiting for clinical questions to populate.

## 2023-06-04 ENCOUNTER — Other Ambulatory Visit (HOSPITAL_COMMUNITY): Payer: Self-pay

## 2023-06-04 NOTE — Telephone Encounter (Signed)
Pharmacy Patient Advocate Encounter  Received notification from [facility] MEDICAID that Prior Authorization for Nurtec 75MG  dispersible tablets has been APPROVED from 05/29/23 to 05/28/24. Ran test claim, Copay is $4.00. This test claim was processed through [facility]- copay amounts may vary at other pharmacies due to pharmacy/plan contracts, or as the patient moves through the different stages of their insurance plan.   PA #/Case ID/Reference #: PA Case ID #: [id number]

## 2023-06-09 ENCOUNTER — Encounter: Payer: Self-pay | Admitting: Nurse Practitioner

## 2023-06-09 ENCOUNTER — Ambulatory Visit: Payer: Medicaid Other | Admitting: Nurse Practitioner

## 2023-06-09 VITALS — BP 118/72 | HR 93 | Wt 165.2 lb

## 2023-06-09 DIAGNOSIS — R2232 Localized swelling, mass and lump, left upper limb: Secondary | ICD-10-CM | POA: Insufficient documentation

## 2023-06-09 HISTORY — DX: Localized swelling, mass and lump, left upper limb: R22.32

## 2023-06-09 NOTE — Assessment & Plan Note (Signed)
Palpable, mobile, non-painful nodule on the left arm, present for several months with recent increase in size. Differential diagnosis includes benign conditions such as lipoma or nerve sheath tumor. Family history of cancer increases concern for malignancy. No other similar nodules or associated lymphadenopathy noted. Discussed potential benign nature and importance of imaging to rule out malignancy. If ultrasound shows suspicious features, biopsy may be necessary. Emphasized importance of monitoring for changes. - Order ultrasound of the left arm nodule. - Instruct to monitor for new nodules or changes in the current nodule and report findings.

## 2023-06-09 NOTE — Progress Notes (Signed)
  Tollie Eth, DNP, AGNP-c [facility] [address] [place], [place] [address] [phone]   ACUTE VISIT- ESTABLISHED PATIENT  Blood pressure 118/72, pulse 93, weight 165 lb 3.2 oz (74.9 kg).  Subjective:  HPI Lydia [name] is a 38 y.o. female presents to day for evaluation of acute concern(s).   Lydia [name] presents with a lump on her left arm that has been present for quite some time, but in the last 3 months has become larger. Initially, the lump was small and only noticeable upon palpation. However, she reports that the lump has grown in size and is now easily palpable. The lump is not associated with any pain. She has not noticed any similar lumps elsewhere on her body. The patient has a family history of cancer, which has caused some concern about the lump. Despite the growth, the lump remains mobile. There is a mild amount of shooting pain in the [facility] when pressed. She has not reported any other symptoms or health issues related to this lump.  ROS negative except for what is listed in HPI. History, Medications, Surgery, SDOH, and Family History reviewed and updated as appropriate.  Objective:  Physical Exam Vitals and nursing note reviewed.  Constitutional:      General: She is not in acute distress.    Appearance: She is not ill-appearing.  Eyes:     Conjunctiva/sclera: Conjunctivae normal.  Skin:    General: Skin is warm and dry.     Capillary Refill: Capillary refill takes less than 2 seconds.     Comments: Left anterior forearm 9mm x 7mm ovoid, firm mass noted. No pain with palpation, but some evidence of shooting pain felt in the [facility] on deep palpation.  Neurological:     General: No focal deficit present.     Mental Status: She is alert and oriented to person, place, and time.     Sensory: No sensory deficit.     Motor: No weakness.     Coordination: Coordination normal.  Psychiatric:        Mood and Affect: Mood normal.         Assessment & Plan:    Problem List Items Addressed This Visit     Subcutaneous nodule of left forearm - Primary   Palpable, mobile, non-painful nodule on the left arm, present for several months with recent increase in size. Differential diagnosis includes benign conditions such as lipoma or nerve sheath tumor. Family history of cancer increases concern for malignancy. No other similar nodules or associated lymphadenopathy noted. Discussed potential benign nature and importance of imaging to rule out malignancy. If ultrasound shows suspicious features, biopsy may be necessary. Emphasized importance of monitoring for changes. - Order ultrasound of the left arm nodule. - Instruct to monitor for new nodules or changes in the current nodule and report findings.      Relevant Orders   US SOFT TISSUE UPPER EXTREMITY LIMITED LEFT (NON-VASCULAR)      Tollie Eth, DNP, AGNP-c

## 2023-06-09 NOTE — Patient Instructions (Addendum)
I have ordered an ultrasound of the area to make sure we can get a better look at this. If it looks like anything that needs to be biopsied, I will certainly send that order in for you. Most nodules like this are benign (meaning non cancerous), but we want to make sure we aren't missing anything.  The imaging center will call you to get this scheduled.

## 2023-06-10 ENCOUNTER — Ambulatory Visit
Admission: RE | Admit: 2023-06-10 | Discharge: 2023-06-10 | Disposition: A | Discharge status: Home / Self Care | Payer: Medicaid Other | Source: Ambulatory Visit | Attending: Nurse Practitioner | Admitting: Nurse Practitioner

## 2023-06-10 DIAGNOSIS — R2232 Localized swelling, mass and lump, left upper limb: Secondary | ICD-10-CM

## 2023-06-12 ENCOUNTER — Other Ambulatory Visit: Payer: Medicaid Other

## 2023-06-12 ENCOUNTER — Encounter: Payer: Self-pay | Admitting: Nurse Practitioner

## 2023-06-12 ENCOUNTER — Ambulatory Visit (INDEPENDENT_AMBULATORY_CARE_PROVIDER_SITE_OTHER): Payer: Medicaid Other | Admitting: Nurse Practitioner

## 2023-06-12 VITALS — BP 112/80 | HR 89 | Ht 60.0 in | Wt 166.0 lb

## 2023-06-12 DIAGNOSIS — R140 Abdominal distension (gaseous): Secondary | ICD-10-CM

## 2023-06-12 DIAGNOSIS — K5900 Constipation, unspecified: Secondary | ICD-10-CM

## 2023-06-12 DIAGNOSIS — K5909 Other constipation: Secondary | ICD-10-CM

## 2023-06-12 LAB — BASIC METABOLIC PANEL
BUN: 9 mg/dL (ref 6–23)
CO2: 28 meq/L (ref 19–32)
Calcium: 9.6 mg/dL (ref 8.4–10.5)
Chloride: 103 meq/L (ref 96–112)
Creatinine, Ser: 0.59 mg/dL (ref 0.40–1.20)
GFR: 115.5 mL/min (ref >60.00)
Glucose, Bld: 83 mg/dL (ref 70–99)
Potassium: 3.8 meq/L (ref 3.5–5.1)
Sodium: 139 meq/L (ref 135–145)

## 2023-06-12 LAB — CBC
HCT: 39.2 % (ref 36.0–46.0)
Hemoglobin: 13.2 g/dL (ref 12.0–15.0)
MCHC: 33.6 g/dL (ref 30.0–36.0)
MCV: 88 fL (ref 78.0–100.0)
Platelets: 422 10*3/uL — ABNORMAL HIGH (ref 150.0–400.0)
RBC: 4.45 Mil/uL (ref 3.87–5.11)
RDW: 12.4 % (ref 11.5–15.5)
WBC: 10.7 10*3/uL — ABNORMAL HIGH (ref 4.0–10.5)

## 2023-06-12 NOTE — Progress Notes (Unsigned)
06/12/2023 Lydia [name] [id number] [birth year]   Chief Complaint: Abdominal bloat follow up  History of Present Illness: Lydia [name] is a 38 year old female with a past medical history of anxiety, GERD, abdominal bloat and chronic constipation. She was last seen in office by Hyacinth Meeker, PA-C on 12/10/2022 for follow-up regarding epigastric pain, chronic constipation and bloat.  At that time, her epigastric pain was somewhat better on Pepcid 40 mg nightly S and was noted Omeprazole worsened her symptoms.  Her constipation improved on Motegrity.  She presents today for recommended follow-up. She is passing a normal formed brown stool three days weekly since starting Motegrity. Prior to taking Motegrity, she passed on BM weekly. No rectal bleeding. Two week ago, she had a "stomach virus" and passed 2 solid black stools. No associated diarrhea. No pepto bismol or oral iron use. No NSAID use. No active GERD symptoms. She avoids dairy and spicy foods.  Her most recent EGD 09/18/2020 was normal, duodenal biopsies were negative for celiac disease.  She underwent a colonoscopy 03/24/2020 which showed ileitis, biopsies were consistent with mild nonspecific active ileitis without classic features of Crohn's disease.  Small bowel MRI 10/17/2020 was negative for small bowel Crohn's disease but identified a 3.9 cm mass in the right lower quadrant near the cecum, suspicious for an appendiceal neoplasm versus an enlarged pathologic lymph node.  She was referred to general surgery and she underwent a laparoscopic appendectomy with resection of mesenteric mass 11/10/2020, pathology demonstrated a leiomyoma.  She underwent intravaginal pelvic sonogram per GYN 05/28/2022 which showed normal ovaries and a questionable 10 mm intramural leiomyoma to the posterior upper uterus.  No recent GERD symptoms or epigastric pain. She eats a fairly healthy diet.  Water intake is insufficient, she drinks 2 glasses of water daily.    PAST GI PROCEDURES:  EGD 09/18/2020: Normal esophagus, stomach and duodenum  1. Surgical [P], small bowel - SMALL INTESTINAL MUCOSA WITH NO SPECIFIC HISTOPATHOLOGIC CHANGES - NEGATIVE FOR INCREASED INTRAEPITHELIAL LYMPHOCYTES OR VILLOUS ARCHITECTURAL CHANGES 2. Surgical [P], gastric - GASTRIC ANTRAL MUCOSA WITH MILD NONSPECIFIC REACTIVE GASTROPATHY - GASTRIC OXYNTIC MUCOSA WITH NO SPECIFIC HISTOPATHOLOGIC CHANGES - WARTHIN STARRY STAIN IS NEGATIVE FOR HELICOBACTER PYLORI 3. Surgical [P], esophagus - ESOPHAGEAL SQUAMOUS MUCOSA WITH NO SPECIFIC HISTOPATHOLOGIC CHANGES - NEGATIVE FOR INCREASED INTRAEPITHELIAL EOSINOPHILS  Colonoscopy 03/24/2020: - Ileitis, rule out Crohn's disease. Biopsied.  - Capacious colon, particularly in the left colon. Likely in part explaining constipation (versus the result of)  - Normal mucosa in the entire examined colon.  - Recall colonoscopy age 78 FOCAL, MILD NONSPECIFIC ACTIVE ILEITIS.  EGD 03/12/2019:  - Very mild reflux esophagitis at GE junction.  - Normal stomach. - Normal examined duodenum.  - No specimens collected.  Past Medical History:  Diagnosis Date   Abdominal pain, epigastric 01/26/2014   Acute recurrent maxillary sinusitis 03/11/2014   Allergy    Anxiety    Chronic constipation    Motegrity helps    Chronic headaches    none x 2-3 yrs   Dyspareunia, female 03/11/2014   GERD (gastroesophageal reflux disease)    HEADACHE 11/30/2007   Qualifier: Diagnosis of  By: Aleene Davidson MD, Merrily Pew    Infection    UTI   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/22/2020   Memory loss 11/30/2007   Qualifier: Diagnosis of   By: Aleene Davidson MD, Elna Breslow         Meningitis    Ovarian cyst  Pain 12/08/2013   Dyspareunia    Pelvic pain in female 07/06/2013   Right-sided back pain 09/28/2021   Strep throat 01/23/2022   Tinea versicolor 12/08/2013   Viral pharyngitis 04/02/2022   Past Surgical History:  Procedure Laterality Date   CHOLECYSTECTOMY   2016   COLONOSCOPY     LAPAROSCOPIC APPENDECTOMY N/A 11/10/2020   Procedure: LAPAROSCOPIC APPENDECTOMY;  Surgeon: Rodman Pickle, MD;  Location: [facility];  Service: General;  Laterality: N/A;   UPPER GASTROINTESTINAL ENDOSCOPY  2015   WISDOM TOOTH EXTRACTION     Current Outpatient Medications on File Prior to Visit  Medication Sig Dispense Refill   atorvastatin (LIPITOR) 20 MG tablet TAKE 1 TABLET(20 MG) BY MOUTH DAILY 90 tablet 1   benzonatate (TESSALON PERLES) 100 MG capsule Take 1 capsule (100 mg total) by mouth 3 (three) times daily as needed. 20 capsule 0   cetirizine (ZYRTEC ALLERGY) 10 MG tablet Take 1 tablet (10 mg total) by mouth daily. 90 tablet 1   famotidine (PEPCID) 40 MG tablet Take 1 tablet (40 mg total) by mouth daily as needed for heartburn or indigestion.     fluticasone (FLONASE) 50 MCG/ACT nasal spray Place 2 sprays into both nostrils daily. 16 g 6   Fremanezumab-vfrm (AJOVY) 225 MG/1.5ML SOAJ Inject 1 Pen into the skin every 30 (thirty) days. 1.68 mL 11   hydrocortisone (ANUSOL-HC) 25 MG suppository Place 1 suppository (25 mg total) rectally 2 (two) times daily. 12 suppository 0   hydrOXYzine (VISTARIL) 50 MG capsule Take 1 capsule (50 mg total) by mouth at bedtime and may repeat dose one time if needed. For sleep and anxiety. 60 capsule 6   JUNEL FE 1.5/30 1.5-30 MG-MCG tablet Take 1 tablet by mouth daily.     meclizine (ANTIVERT) 12.5 MG tablet Take 1 tablet (12.5 mg total) by mouth 3 (three) times daily as needed for dizziness. 30 tablet 0   naproxen (NAPROSYN) 500 MG tablet Take 1 tablet (500 mg total) by mouth 2 (two) times daily with a meal. 30 tablet 0   ondansetron (ZOFRAN-ODT) 4 MG disintegrating tablet Take 1 tablet (4 mg total) by mouth every 4-6 hours as needed for nausea or vomiting. 15 tablet 1   pantoprazole (PROTONIX) 40 MG tablet TAKE 1 TABLET(40 MG) BY MOUTH DAILY 30 tablet 3   Prucalopride Succinate (MOTEGRITY) 2 MG TABS Take 1 tablet (2 mg total) by  mouth daily. 30 tablet 2   Rimegepant Sulfate (NURTEC) 75 MG TBDP Take 1 tablet (75 mg total) by mouth every other day. Take one pill at onset of migraine. Max dose 1 pill in 24 hours 16 tablet 11   triamcinolone cream (KENALOG) 0.1 % APPLY TOPICALLY TO THE AFFECTED AREA TWICE DAILY 45 g 0   No current facility-administered medications on file prior to visit.   Allergies  Allergen Reactions   Penicillins Hives, Itching and Rash    Has patient had a PCN reaction causing immediate rash, facial/tongue/throat swelling, SOB or lightheadedness with hypotension: Yes Has patient had a PCN reaction causing severe rash involving mucus membranes or skin necrosis: Yes Has patient had a PCN reaction that required hospitalization no Has patient had a PCN reaction occurring within the last 10 years: No If all of the above answers are "NO", then may proceed with Cephalosporin use. Has patient had a PCN reaction causing immediate rash, facial/tongue/throat swelling, SOB or lightheadedness with hypotension: Yes Has patient had a PCN reaction causing severe rash involving mucus membranes  or skin necrosis: Yes Has patient had a PCN reaction that required hospitalization no Has patient had a PCN reaction occurring within the last 10 years: No If all of the above answers are "NO", then may proceed with Cephalosporin use.   Current Medications, Allergies, Past Medical History, Past Surgical History, Family History and Social History were reviewed in [employer] record.  Review of Systems:   Constitutional: Negative for fever, sweats, chills or weight loss.  Respiratory: Negative for shortness of breath.   Cardiovascular: Negative for chest pain, palpitations and leg swelling.  Gastrointestinal: See HPI.  Musculoskeletal: Negative for back pain or muscle aches.  Neurological: Negative for dizziness, headaches or paresthesias.    Physical Exam: BP 112/80 (BP Location: Left Arm, Patient  Position: Sitting, Cuff Size: Normal)   Pulse 89   Ht 5' (1.524 m)   Wt 166 lb (75.3 kg)   LMP  (LMP Unknown)   SpO2 99%   BMI 32.42 kg/m  Wt Readings from Last 3 Encounters:  06/12/23 166 lb (75.3 kg)  06/09/23 165 lb 3.2 oz (74.9 kg)  02/05/23 163 lb 12.8 oz (74.3 kg)    General: 38 year old female in no acute distress. Head: Normocephalic and atraumatic. Eyes: No scleral icterus. Conjunctiva pink . Ears: Normal auditory acuity. Mouth: Dentition intact. No ulcers or lesions.  Lungs: Clear throughout to auscultation. Heart: Regular rate and rhythm, no murmur. Abdomen: Soft, nontender and nondistended. No masses or hepatomegaly. Normal bowel sounds x 4 quadrants.  Rectal: Deferred.  Musculoskeletal: Symmetrical with no gross deformities. Extremities: No edema. Neurological: Alert oriented x 4. No focal deficits.  Psychological: Alert and cooperative. Normal mood and affect  Assessment and Recommendations:   38 year old female with chronic constipation and abdominal bloat, symptoms improved on Motegrity 2 mg 1 tab daily.  Patient endorses passing a BM 3 days weekly. -Continue Motegrity 2 mg 1 tab daily -Add senna 2 tabs every third night to increase stool output -IBgard 1 tab p.o. twice daily for abdominal bloat -SIBO breath test, complete in 2 weeks if constipation improved but abdominal bloat persits -Follow-up as needed  Colon cancer screening -Screening colonoscopy due at the age of 73

## 2023-06-12 NOTE — Patient Instructions (Addendum)
Please purchase the following medications over the counter and take as directed: Senna- twice once every 3rd night   We have given you samples of the following medication to take: IBGARD-  take twice daily for bloat   (In 2 weeks) You have been given a testing kit to check for small intestine bacterial overgrowth (SIBO) which is completed by a company named [facility]. Make sure to return your test in the mail using the return mailing label given to you along with the kit. The test order, your demographic and insurance information have all already been sent to the company. [facility] will collect an upfront charge of $99.74 for commercial insurance plans and $209.74 if you are paying cash. Make sure to discuss with [facility] PRIOR to having the test to see if they have gotten information from your insurance company as to how much your testing will cost out of pocket, if any. Please contact [facility] at phone number [phone] to get instructions regarding how to perform the test as our office is unable to give specific testing instructions.  Your provider has requested that you go to the basement level for lab work before leaving today. Press "B" on the elevator. The lab is located at the first door on the left as you exit the elevator.  Please increase water intake. At least 64 ounces of water daily.  _______________________________________________________  If your blood pressure at your visit was 140/90 or greater, please contact your primary care physician to follow up on this.  _______________________________________________________  If you are age 37 or older, your body mass index should be between 23-30. Your Body mass index is 32.42 kg/m. If this is out of the aforementioned range listed, please consider follow up with your Primary Care Provider.  If you are age 37 or younger, your body mass index should be between 19-25. Your Body mass index is 32.42 kg/m. If  this is out of the aformentioned range listed, please consider follow up with your Primary Care Provider.   ________________________________________________________  The [place] [facility] providers would like to encourage you to use [facility] to communicate with providers for non-urgent requests or questions.  Due to long hold times on the telephone, sending your provider a message by [facility] may be a faster and more efficient way to get a response.  Please allow 48 business hours for a response.  Please remember that this is for non-urgent requests.  _______________________________________________________ It was a pleasure to see you today!  Thank you for trusting me with your gastrointestinal care!

## 2023-06-16 ENCOUNTER — Encounter: Payer: Self-pay | Admitting: Nurse Practitioner

## 2023-06-16 NOTE — Telephone Encounter (Signed)
See lab result notes

## 2023-06-17 ENCOUNTER — Encounter: Payer: Self-pay | Admitting: Nurse Practitioner

## 2023-06-18 ENCOUNTER — Telehealth: Payer: Medicaid Other | Admitting: Physician Assistant

## 2023-06-18 DIAGNOSIS — M545 Low back pain, unspecified: Secondary | ICD-10-CM

## 2023-06-18 DIAGNOSIS — R531 Weakness: Secondary | ICD-10-CM

## 2023-06-18 NOTE — Progress Notes (Signed)
  Because of severity of pain, radiation of pain into legs, and noted weakness, I feel your condition warrants further evaluation and I recommend that you be seen in a face-to-face visit.   NOTE: There will be NO CHARGE for this E-Visit   If you are having a true medical emergency, please call 911.     For an urgent face to face visit, [place] has multiple urgent care centers for your convenience.  Click the link below for the full list of locations and hours, walk-in wait times, appointment scheduling options and driving directions:  Urgent Care - [place], [place], [place], [place], [place], [place]  [place]     Your MyChart E-visit questionnaire answers were reviewed by a board certified advanced clinical practitioner to complete your personal care plan based on your specific symptoms.    Thank you for using e-Visits.

## 2023-07-02 ENCOUNTER — Telehealth: Payer: Medicaid Other | Admitting: Nurse Practitioner

## 2023-07-02 ENCOUNTER — Ambulatory Visit: Payer: Medicaid Other | Admitting: Orthopedic Surgery

## 2023-07-02 ENCOUNTER — Encounter: Payer: Self-pay | Admitting: Nurse Practitioner

## 2023-07-02 VITALS — Wt 165.0 lb

## 2023-07-02 DIAGNOSIS — R202 Paresthesia of skin: Secondary | ICD-10-CM

## 2023-07-02 DIAGNOSIS — M25562 Pain in left knee: Secondary | ICD-10-CM

## 2023-07-02 DIAGNOSIS — M1990 Unspecified osteoarthritis, unspecified site: Secondary | ICD-10-CM

## 2023-07-02 DIAGNOSIS — M545 Low back pain, unspecified: Secondary | ICD-10-CM

## 2023-07-02 DIAGNOSIS — M25561 Pain in right knee: Secondary | ICD-10-CM | POA: Diagnosis not present

## 2023-07-02 DIAGNOSIS — M25531 Pain in right wrist: Secondary | ICD-10-CM

## 2023-07-02 DIAGNOSIS — M25532 Pain in left wrist: Secondary | ICD-10-CM

## 2023-07-02 DIAGNOSIS — G8929 Other chronic pain: Secondary | ICD-10-CM

## 2023-07-02 HISTORY — DX: Pain in right wrist: M25.531

## 2023-07-02 MED ORDER — CELECOXIB 100 MG PO CAPS: 100.0000 mg | ORAL_CAPSULE | Freq: Two times a day (BID) | ORAL | 3 refills | Status: DC

## 2023-07-02 MED ORDER — GABAPENTIN 100 MG PO CAPS
100.0000 mg | ORAL_CAPSULE | Freq: Every day | ORAL | 3 refills | Status: DC
Start: 2023-07-02 — End: 2023-10-21

## 2023-07-02 NOTE — Assessment & Plan Note (Signed)
Chronic low back pain accompanied with new pain in the knees for over two months. Pain is achy and sharp, with episodes of knee instability. MRI from last year shows a small L5-S1 bulge, contributing to low back pain but not fully explaining knee pain. Discussed Celebrex for inflammation, noting it is less harsh on the stomach compared to ibuprofen or naproxen. Gabapentin was also discussed for pain and tingling, especially at bedtime. - Prescribe Celebrex for inflammation - Prescribe gabapentin for pain and tingling - Order labs to rule out systemic or autoimmune conditions

## 2023-07-02 NOTE — Assessment & Plan Note (Signed)
Numbness and tingling in the hands bilaterally with no correlation with arm or neck movement. Symptoms improved with wrist bracing. Suggestive symptoms of carpal tunnel despite previous negative nerve conduction studies a few years ago.

## 2023-07-02 NOTE — Progress Notes (Signed)
Virtual Visit Encounter mychart visit.   I connected with  Lydia [name] on 07/02/23 at 10:45 AM EST by secure video and audio telemedicine application. I verified that I am speaking with the correct person using two identifiers.   I introduced myself as a Publishing rights manager with the practice. The limitations of evaluation and management by telemedicine discussed with the patient and the availability of in person appointments. The patient expressed verbal understanding and consent to proceed.  Participating parties in this visit include: Myself and patient  The patient is: Patient Location: Home I am: Provider Location: Office/Clinic Subjective:    CC and HPI: Lydia [name] is a 38 y.o. year old female presenting for new evaluation and treatment of pain.  History of Present Illness Lydia [name] is a 38 year old female who presents with low back pain, bilateral knee pain, and arm and wrist pain.  She has been experiencing low back pain radiating to her knees for over two months. The pain is described as both achy and sharp, originating in the lower back, but also felt in the knees. Her knees sometimes feel like they might give out when walking, and the pain is primarily worse in the evenings. A previous MRI of the back from last year showed a small bulge at L5-S1. Symptoms worsen in cold weather.  It appears that she does not feel like the pain is shooting through the legs or radiating, just present in both locations.   In addition to the back and knee pain, she has recently developed pain in her arms and wrists, accompanied by tingling in her hands. No changes in the appearance of her joints. She uses wrist braces during the day, which she finds helpful, although she does not wear them at night due to anxiety. Neck or shoulder movements do not exacerbate the tingling.   She has a strong family history of arthritis, with several aunts experiencing joint problems.  Past medical history,  Surgical history, Family history not pertinant except as noted below, Social history, Allergies, and medications have been entered into the medical record, reviewed, and corrections made.   Review of Systems:  All review of systems negative except what is listed in the HPI  Objective:    Alert and oriented x 4 Speaking in clear sentences with no shortness of breath. No distress.  Impression and Recommendations:    Problem List Items Addressed This Visit     Paresthesias   Numbness and tingling in the hands bilaterally with no correlation with arm or neck movement. Symptoms improved with wrist bracing. Suggestive symptoms of carpal tunnel despite previous negative nerve conduction studies a few years ago.       Relevant Medications   celecoxib (CELEBREX) 100 MG capsule   gabapentin (NEURONTIN) 100 MG capsule   Other Relevant Orders   Vitamin B12   CBC with Differential/Platelet   Iron, TIBC and Ferritin Panel   VITAMIN D 25 Hydroxy (Vit-D Deficiency, Fractures)   TSH   T4, free   ANA, IFA (with reflex)   Anti-CCP Ab, IgG + IgA (RDL)   Chronic bilateral low back pain without sciatica - Primary   Chronic low back pain accompanied with new pain in the knees for over two months. Pain is achy and sharp, with episodes of knee instability. MRI from last year shows a small L5-S1 bulge, contributing to low back pain but not fully explaining knee pain. Discussed Celebrex for inflammation, noting it is less harsh  on the stomach compared to ibuprofen or naproxen. Gabapentin was also discussed for pain and tingling, especially at bedtime. - Prescribe Celebrex for inflammation - Prescribe gabapentin for pain and tingling - Order labs to rule out systemic or autoimmune conditions      Relevant Medications   celecoxib (CELEBREX) 100 MG capsule   gabapentin (NEURONTIN) 100 MG capsule   Other Relevant Orders   Vitamin B12   CBC with Differential/Platelet   Iron, TIBC and Ferritin Panel    VITAMIN D 25 Hydroxy (Vit-D Deficiency, Fractures)   TSH   T4, free   ANA, IFA (with reflex)   Anti-CCP Ab, IgG + IgA (RDL)   Chronic pain of both knees   Relevant Medications   celecoxib (CELEBREX) 100 MG capsule   gabapentin (NEURONTIN) 100 MG capsule   Other Relevant Orders   Vitamin B12   CBC with Differential/Platelet   Iron, TIBC and Ferritin Panel   VITAMIN D 25 Hydroxy (Vit-D Deficiency, Fractures)   TSH   T4, free   ANA, IFA (with reflex)   Anti-CCP Ab, IgG + IgA (RDL)   Pain in both wrists   Tingling in hands, especially at night. Previous nerve test was negative for carpal tunnel syndrome, but wrist braces provide some relief. Symptoms suggestive of carpal tunnel syndrome despite negative test results. Discussed the use of wrist braces during the day and the potential need for further evaluation if symptoms persist. - Continue using wrist braces during the day - Consider further evaluation if symptoms persist - celebrex sent for inflammation relief and gabapentin for neuropathic pain      Relevant Medications   celecoxib (CELEBREX) 100 MG capsule   gabapentin (NEURONTIN) 100 MG capsule   Other Relevant Orders   Vitamin B12   CBC with Differential/Platelet   Iron, TIBC and Ferritin Panel   VITAMIN D 25 Hydroxy (Vit-D Deficiency, Fractures)   TSH   T4, free   ANA, IFA (with reflex)   Anti-CCP Ab, IgG + IgA (RDL)   Arthritis   Pain in multiple joints including arms, wrists, and knees, potentially indicative of a systemic arthritic condition. Family history of arthritis. Symptoms worsen in cold weather. No visible joint swelling or deformity. Discussed the possibility of autoimmune conditions such as rheumatoid arthritis or lupus and the need to rule these out with lab tests. Explained that if an autoimmune component is found, treatments may include medications to calm the immune system and reduce joint inflammation. - Order labs to rule out autoimmune conditions such as  rheumatoid arthritis and lupus - Consider referral to orthopedics if symptoms persist after initial treatment      Relevant Medications   celecoxib (CELEBREX) 100 MG capsule    orders and follow up as documented in EMR I discussed the assessment and treatment plan with the patient. The patient was provided an opportunity to ask questions and all were answered. The patient agreed with the plan and demonstrated an understanding of the instructions.   The patient was advised to call back or seek an in-person evaluation if the symptoms worsen or if the condition fails to improve as anticipated.   Follow-up - Visit the office for lab work when weather permits - Call the office to confirm if it is open before coming for labs - Follow-up appointment to review lab results and treatment efficacy.  I provided 20 minutes of non-face-to-face interaction with this non face-to-face encounter including intake, same-day documentation, and chart review.   Sung Amabile  Airyonna Franklyn, NP , DNP, AGNP-c [facility]

## 2023-07-02 NOTE — Assessment & Plan Note (Signed)
Tingling in hands, especially at night. Previous nerve test was negative for carpal tunnel syndrome, but wrist braces provide some relief. Symptoms suggestive of carpal tunnel syndrome despite negative test results. Discussed the use of wrist braces during the day and the potential need for further evaluation if symptoms persist. - Continue using wrist braces during the day - Consider further evaluation if symptoms persist - celebrex sent for inflammation relief and gabapentin for neuropathic pain

## 2023-07-02 NOTE — Patient Instructions (Signed)
VISIT SUMMARY:  Today, we discussed your ongoing low back pain radiating to your knees, new arm and wrist pain, and tingling in your hands. We reviewed your symptoms, family history, and previous MRI results. We also discussed potential treatments and the need for further evaluation through lab tests.  YOUR PLAN:  -LOW BACK PAIN WITH RADICULOPATHY: This condition involves pain that starts in the lower back and radiates down to the knees, often due to nerve irritation. We will start you on Celebrex for inflammation and gabapentin for pain and tingling. We will also conduct lab tests to rule out any systemic or autoimmune conditions.  -SYSTEMIC ARTHRITIC PAIN: This refers to pain in multiple joints, which may be due to an underlying systemic arthritic condition like rheumatoid arthritis or lupus. We will perform lab tests to check for these conditions and consider a referral to orthopedics if your symptoms persist.  -CARPAL TUNNEL SYNDROME: This condition causes tingling and pain in the hands and wrists, often due to nerve compression. Despite a negative previous test, your symptoms suggest carpal tunnel syndrome. Continue using wrist braces during the day, and we may need further evaluation if symptoms persist.  INSTRUCTIONS:  Please visit the office for lab work when the weather permits. Call the office to confirm if it is open before coming for labs. Schedule a follow-up appointment to review your lab results and assess the effectiveness of your treatment.

## 2023-07-02 NOTE — Assessment & Plan Note (Signed)
Pain in multiple joints including arms, wrists, and knees, potentially indicative of a systemic arthritic condition. Family history of arthritis. Symptoms worsen in cold weather. No visible joint swelling or deformity. Discussed the possibility of autoimmune conditions such as rheumatoid arthritis or lupus and the need to rule these out with lab tests. Explained that if an autoimmune component is found, treatments may include medications to calm the immune system and reduce joint inflammation. - Order labs to rule out autoimmune conditions such as rheumatoid arthritis and lupus - Consider referral to orthopedics if symptoms persist after initial treatment

## 2023-07-08 ENCOUNTER — Other Ambulatory Visit: Payer: Medicaid Other

## 2023-07-08 DIAGNOSIS — M545 Other chronic pain: Secondary | ICD-10-CM

## 2023-07-08 DIAGNOSIS — M25531 Pain in right wrist: Secondary | ICD-10-CM

## 2023-07-08 DIAGNOSIS — G8929 Other chronic pain: Secondary | ICD-10-CM

## 2023-07-08 DIAGNOSIS — R202 Paresthesia of skin: Secondary | ICD-10-CM

## 2023-07-09 ENCOUNTER — Ambulatory Visit: Payer: Medicaid Other | Admitting: Orthopedic Surgery

## 2023-07-14 ENCOUNTER — Encounter: Payer: Self-pay | Admitting: Nurse Practitioner

## 2023-07-15 ENCOUNTER — Ambulatory Visit: Payer: Medicaid Other | Admitting: Obstetrics

## 2023-07-15 ENCOUNTER — Encounter: Payer: Self-pay | Admitting: Obstetrics

## 2023-07-15 VITALS — BP 100/70 | HR 81 | Ht 60.0 in | Wt 168.4 lb

## 2023-07-15 DIAGNOSIS — N946 Dysmenorrhea, unspecified: Secondary | ICD-10-CM

## 2023-07-15 DIAGNOSIS — N926 Irregular menstruation, unspecified: Secondary | ICD-10-CM

## 2023-07-15 DIAGNOSIS — Z3041 Encounter for surveillance of contraceptive pills: Secondary | ICD-10-CM

## 2023-07-15 LAB — CBC WITH DIFFERENTIAL/PLATELET
Basophils Absolute: 0 10*3/uL (ref 0.0–0.2)
Basos: 1 %
EOS (ABSOLUTE): 0.2 10*3/uL (ref 0.0–0.4)
Eos: 2 %
Hematocrit: 41.2 % (ref 34.0–46.6)
Hemoglobin: 13.8 g/dL (ref 11.1–15.9)
Immature Grans (Abs): 0 10*3/uL (ref 0.0–0.1)
Immature Granulocytes: 0 %
Lymphocytes Absolute: 2.8 10*3/uL (ref 0.7–3.1)
Lymphs: 33 %
MCH: 30.1 pg (ref 26.6–33.0)
MCHC: 33.5 g/dL (ref 31.5–35.7)
MCV: 90 fL (ref 79–97)
Monocytes Absolute: 0.4 10*3/uL (ref 0.1–0.9)
Monocytes: 5 %
Neutrophils Absolute: 5.1 10*3/uL (ref 1.4–7.0)
Neutrophils: 59 %
Platelets: 421 10*3/uL (ref 150–450)
RBC: 4.58 x10E6/uL (ref 3.77–5.28)
RDW: 12 % (ref 11.7–15.4)
WBC: 8.5 10*3/uL (ref 3.4–10.8)

## 2023-07-15 LAB — T4, FREE: Free T4: 0.92 ng/dL (ref 0.82–1.77)

## 2023-07-15 LAB — VITAMIN D 25 HYDROXY (VIT D DEFICIENCY, FRACTURES): Vit D, 25-Hydroxy: 26.7 ng/mL — ABNORMAL LOW (ref 30.0–100.0)

## 2023-07-15 LAB — IRON,TIBC AND FERRITIN PANEL
Ferritin: 94 ng/mL (ref 15–150)
Iron Saturation: 25 % (ref 15–55)
Iron: 104 ug/dL (ref 27–159)
Total Iron Binding Capacity: 414 ug/dL (ref 250–450)
UIBC: 310 ug/dL (ref 131–425)

## 2023-07-15 LAB — VITAMIN B12: Vitamin B-12: 512 pg/mL (ref 232–1245)

## 2023-07-15 LAB — ANTI-CCP AB, IGG + IGA (RDL): Anti-CCP Ab, IgG + IgA (RDL): <20 U (ref <20)

## 2023-07-15 LAB — TSH: TSH: 1.59 u[IU]/mL (ref 0.450–4.500)

## 2023-07-15 LAB — ANTINUCLEAR ANTIBODIES, IFA: ANA Titer 1: NEGATIVE

## 2023-07-15 MED ORDER — IBUPROFEN 800 MG PO TABS: 800.0000 mg | ORAL_TABLET | Freq: Three times a day (TID) | ORAL | 5 refills | Status: AC | PRN

## 2023-07-15 MED ORDER — JUNEL FE 1.5/30 1.5-30 MG-MCG PO TABS: 1.0000 | ORAL_TABLET | Freq: Every day | ORAL | 12 refills | Status: AC

## 2023-07-15 NOTE — Progress Notes (Signed)
 Pt presents for new bc. Pt still wants the pill but would like a different kind.

## 2023-07-15 NOTE — Progress Notes (Signed)
 Subjective:    Lydia [name] is a 38 y.o. female who presents for contraception counseling. The patient has no complaints today. The patient is sexually active. Pertinent past medical history: none.  The information documented in the HPI was reviewed and verified.  Menstrual History: OB History     Gravida  4   Para  3   Term  3   Preterm  0   AB  1   Living  3      SAB  1   IAB  0   Ectopic  0   Multiple  0   Live Births  3            No LMP recorded (lmp unknown).   Patient Active Problem List   Diagnosis Date Noted   Chronic bilateral low back pain without sciatica 07/02/2023   Chronic pain of both knees 07/02/2023   Pain in both wrists 07/02/2023   Arthritis 07/02/2023   Subcutaneous nodule of left forearm 06/09/2023   Gastroesophageal reflux disease 02/05/2023   Dizziness 07/15/2022   Reflux pharyngitis 04/02/2022   Chronic cystitis 12/17/2021   Somatic dysfunction of spine, lumbar 09/28/2021   Insomnia 07/09/2021   Panic attack 06/06/2021   Paresthesias 06/06/2021   Generalized anxiety disorder 04/20/2021   Chronic daily headache 04/20/2021   Chronic idiopathic constipation 03/29/2021   Encounter for annual physical exam 12/13/2020   Family history of congenital anomalies    Past Medical History:  Diagnosis Date   Abdominal pain, epigastric 01/26/2014   Acute recurrent maxillary sinusitis 03/11/2014   Allergy    Anxiety    Chronic constipation    Motegrity helps    Chronic headaches    none x 2-3 yrs   Dyspareunia, female 03/11/2014   GERD (gastroesophageal reflux disease)    HEADACHE 11/30/2007   Qualifier: Diagnosis of  By: Aleene Davidson MD, Merrily Pew    Infection    UTI   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/22/2020   Memory loss 11/30/2007   Qualifier: Diagnosis of   By: Aleene Davidson MD, Yashashwi         Meningitis    Ovarian cyst    Pain 12/08/2013   Dyspareunia    Pelvic pain in female 07/06/2013   Right-sided back pain 09/28/2021    Strep throat 01/23/2022   Tinea versicolor 12/08/2013   Viral pharyngitis 04/02/2022    Past Surgical History:  Procedure Laterality Date   CHOLECYSTECTOMY  2016   COLONOSCOPY     LAPAROSCOPIC APPENDECTOMY N/A 11/10/2020   Procedure: LAPAROSCOPIC APPENDECTOMY;  Surgeon: Rodman Pickle, MD;  Location: [facility];  Service: General;  Laterality: N/A;   UPPER GASTROINTESTINAL ENDOSCOPY  2015   WISDOM TOOTH EXTRACTION       Current Outpatient Medications:    atorvastatin (LIPITOR) 20 MG tablet, TAKE 1 TABLET(20 MG) BY MOUTH DAILY, Disp: 90 tablet, Rfl: 1   celecoxib (CELEBREX) 100 MG capsule, Take 1 capsule (100 mg total) by mouth 2 (two) times daily., Disp: 60 capsule, Rfl: 3   hydrOXYzine (VISTARIL) 50 MG capsule, Take 1 capsule (50 mg total) by mouth at bedtime and may repeat dose one time if needed. For sleep and anxiety., Disp: 60 capsule, Rfl: 6   cetirizine (ZYRTEC ALLERGY) 10 MG tablet, Take 1 tablet (10 mg total) by mouth daily. (Patient not taking: Reported on 07/15/2023), Disp: 90 tablet, Rfl: 1   famotidine (PEPCID) 40 MG tablet, Take 1 tablet (40 mg total) by  mouth daily as needed for heartburn or indigestion. (Patient not taking: Reported on 07/15/2023), Disp: , Rfl:    Fremanezumab-vfrm (AJOVY) 225 MG/1.5ML SOAJ, Inject 1 Pen into the skin every 30 (thirty) days. (Patient not taking: Reported on 07/15/2023), Disp: 1.68 mL, Rfl: 11   gabapentin (NEURONTIN) 100 MG capsule, Take 1-3 capsules (100-300 mg total) by mouth at bedtime. For numbness and tingling in the arms. (Patient not taking: Reported on 07/15/2023), Disp: 90 capsule, Rfl: 3   hydrocortisone (ANUSOL-HC) 25 MG suppository, Place 1 suppository (25 mg total) rectally 2 (two) times daily. (Patient not taking: Reported on 07/15/2023), Disp: 12 suppository, Rfl: 0   ibuprofen (ADVIL) 800 MG tablet, Take 1 tablet (800 mg total) by mouth every 8 (eight) hours as needed., Disp: 30 tablet, Rfl: 5   JUNEL FE 1.5/30 1.5-30 MG-MCG tablet,  Take 1 tablet by mouth daily., Disp: 28 tablet, Rfl: 12   meclizine (ANTIVERT) 12.5 MG tablet, Take 1 tablet (12.5 mg total) by mouth 3 (three) times daily as needed for dizziness. (Patient not taking: Reported on 07/15/2023), Disp: 30 tablet, Rfl: 0   ondansetron (ZOFRAN-ODT) 4 MG disintegrating tablet, Take 1 tablet (4 mg total) by mouth every 4-6 hours as needed for nausea or vomiting. (Patient not taking: Reported on 07/15/2023), Disp: 15 tablet, Rfl: 1   pantoprazole (PROTONIX) 40 MG tablet, TAKE 1 TABLET(40 MG) BY MOUTH DAILY (Patient not taking: Reported on 07/15/2023), Disp: 30 tablet, Rfl: 3   Prucalopride Succinate (MOTEGRITY) 2 MG TABS, Take 1 tablet (2 mg total) by mouth daily. (Patient not taking: Reported on 07/15/2023), Disp: 30 tablet, Rfl: 2   Rimegepant Sulfate (NURTEC) 75 MG TBDP, Take 1 tablet (75 mg total) by mouth every other day. Take one pill at onset of migraine. Max dose 1 pill in 24 hours (Patient not taking: Reported on 07/15/2023), Disp: 16 tablet, Rfl: 11   triamcinolone cream (KENALOG) 0.1 %, APPLY TOPICALLY TO THE AFFECTED AREA TWICE DAILY (Patient not taking: Reported on 07/15/2023), Disp: 45 g, Rfl: 0 Allergies  Allergen Reactions   Penicillins Hives, Itching and Rash    Has patient had a PCN reaction causing immediate rash, facial/tongue/throat swelling, SOB or lightheadedness with hypotension: Yes Has patient had a PCN reaction causing severe rash involving mucus membranes or skin necrosis: Yes Has patient had a PCN reaction that required hospitalization no Has patient had a PCN reaction occurring within the last 10 years: No If all of the above answers are "NO", then may proceed with Cephalosporin use. Has patient had a PCN reaction causing immediate rash, facial/tongue/throat swelling, SOB or lightheadedness with hypotension: Yes Has patient had a PCN reaction causing severe rash involving mucus membranes or skin necrosis: Yes Has patient had a PCN reaction that required  hospitalization no Has patient had a PCN reaction occurring within the last 10 years: No If all of the above answers are "NO", then may proceed with Cephalosporin use.    Social History   Tobacco Use   Smoking status: Never   Smokeless tobacco: Never  Substance Use Topics   Alcohol use: Not Currently    Comment: occ    Family History  Problem Relation Age of Onset   Migraines Mother    Hypertension Mother    Asthma Father    Birth defects Daughter        Limb reduction defect   Breast cancer Maternal Aunt 15   Colon cancer Neg Hx    Esophageal cancer Neg Hx  Rectal cancer Neg Hx    Stomach cancer Neg Hx    Colon polyps Neg Hx        Review of Systems Constitutional: negative for weight loss Genitourinary:negative for abnormal menstrual periods and vaginal discharge   Objective:   BP 100/70   Pulse 81   Ht 5' (1.524 m)   Wt 168 lb 6.4 oz (76.4 kg)   LMP  (LMP Unknown)   BMI 32.89 kg/m    General:   alert  Skin:   no rash or abnormalities  Lungs:   clear to auscultation bilaterally  Heart:   regular rate and rhythm, S1, S2 normal, no murmur, click, rub or gallop  Breasts:   normal without suspicious masses, skin or nipple changes or axillary nodes  Abdomen:  normal findings: no organomegaly, soft, non-tender and no hernia  Pelvis:  External genitalia: normal general appearance Urinary system: urethral meatus normal and bladder without fullness, nontender Vaginal: normal without tenderness, induration or masses Cervix: normal appearance Adnexa: normal bimanual exam Uterus: anteverted and non-tender, normal size   Lab Review Urine pregnancy test Labs reviewed yes Radiologic studies reviewed yes  I have spent a total of 20 minutes of face-to-face time, excluding clinical staff time, reviewing notes and preparing to see patient, ordering tests and/or medications, and counseling the patient.   Assessment:    38 y.o., continuing OCP  (estrogen/progesterone), no contraindications.   Plan:   1. Encounter for surveillance of contraceptive pills (Primary) Rx: - JUNEL FE 1.5/30 1.5-30 MG-MCG tablet; Take 1 tablet by mouth daily.  Dispense: 28 tablet; Refill: 12  2. Menstrual periods irregular - continue OCP's as directed  3. Dysmenorrhea Rx: - ibuprofen (ADVIL) 800 MG tablet; Take 1 tablet (800 mg total) by mouth every 8 (eight) hours as needed.  Dispense: 30 tablet; Refill: 5     All questions answered. Contraception: OCP (estrogen/progesterone). Discussed healthy lifestyle modifications. Follow up in 4 weeks.  Meds ordered this encounter  Medications   JUNEL FE 1.5/30 1.5-30 MG-MCG tablet    Sig: Take 1 tablet by mouth daily.    Dispense:  28 tablet    Refill:  12   ibuprofen (ADVIL) 800 MG tablet    Sig: Take 1 tablet (800 mg total) by mouth every 8 (eight) hours as needed.    Dispense:  30 tablet    Refill:  5     Brock Bad, MD, FACOG Attending Obstetrician & Gynecologist, [facility], [facility], [place] 07/15/2023

## 2023-07-19 ENCOUNTER — Telehealth

## 2023-07-19 DIAGNOSIS — N898 Other specified noninflammatory disorders of vagina: Secondary | ICD-10-CM

## 2023-07-19 DIAGNOSIS — R399 Unspecified symptoms and signs involving the genitourinary system: Secondary | ICD-10-CM

## 2023-07-20 NOTE — Progress Notes (Signed)
  Because you are having UTI symptoms and vaginal discharge, I feel your condition warrants further evaluation and I recommend that you be seen in a face-to-face visit.   NOTE: There will be NO CHARGE for this E-Visit   If you are having a true medical emergency, please call 911.     For an urgent face to face visit, [place] has multiple urgent care centers for your convenience.  Click the link below for the full list of locations and hours, walk-in wait times, appointment scheduling options and driving directions:  Urgent Care - [place], [place], [place], [place], [place], [place]  [place]     Your MyChart E-visit questionnaire answers were reviewed by a board certified advanced clinical practitioner to complete your personal care plan based on your specific symptoms.    Thank you for using e-Visits.

## 2023-07-21 ENCOUNTER — Ambulatory Visit: Admitting: Orthopedic Surgery

## 2023-07-21 NOTE — Progress Notes (Unsigned)
 CC:  headaches  Follow-up Visit  Last visit: 12/25/2022 Dr. Delena Bali  Brief HPI: 38 year old female with a history of vitamin D deficiency, viral meningitis (2006) who follows in clinic for headaches which began at age 42 following viral meningitis. 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 Ophthalmology exam 07/24/21 was negative for papilledema.   At her last visit she was continued on Ajovy for prevention. Nurtec was started for rescue.  She was provided Toradol injection for acute migraine headache.  Interval History:     Headaches have been well-controlled on Ajovy and Nurtec. Nurtec works within 30 minutes most of the time. Had been headache-free for several months until recently. Last week she developed a migraine which lasted for 6 days. It has improved significantly, though she continues to have a headache when she exerts herself.   Migraine days per month: 6 Headache free days per month: 24  Current Headache Regimen: Preventative: Ajovy Abortive: Nurtec 75 mg PRN   Prior Therapies                                  Preventive: Diamox Topamax 200 mg QHS Amitriptyline 50 mg QHS Zoloft 75 mg daily Emgality 120 mg monthly Ajovy 225 mg monthly   Rescue: Zofran Tylenol Ibuprofen Imitrex 50 mg PRN - lack of efficacy Maxalt 10 mg PRN - lack of efficacy Nurtec  ROS:   14 system review of systems performed and negative with exception of those listed in HPI  Outpatient Encounter Medications as of 07/22/2023  Medication Sig   atorvastatin (LIPITOR) 20 MG tablet TAKE 1 TABLET(20 MG) BY MOUTH DAILY   celecoxib (CELEBREX) 100 MG capsule Take 1 capsule (100 mg total) by mouth 2 (two) times daily.   cetirizine (ZYRTEC ALLERGY) 10 MG tablet Take 1 tablet (10 mg total) by mouth daily. (Patient not taking: Reported on 07/15/2023)   famotidine (PEPCID) 40 MG tablet Take 1 tablet (40 mg total) by mouth daily as needed for  heartburn or indigestion. (Patient not taking: Reported on 07/15/2023)   Fremanezumab-vfrm (AJOVY) 225 MG/1.5ML SOAJ Inject 1 Pen into the skin every 30 (thirty) days. (Patient not taking: Reported on 07/15/2023)   gabapentin (NEURONTIN) 100 MG capsule Take 1-3 capsules (100-300 mg total) by mouth at bedtime. For numbness and tingling in the arms. (Patient not taking: Reported on 07/15/2023)   hydrocortisone (ANUSOL-HC) 25 MG suppository Place 1 suppository (25 mg total) rectally 2 (two) times daily. (Patient not taking: Reported on 07/15/2023)   hydrOXYzine (VISTARIL) 50 MG capsule Take 1 capsule (50 mg total) by mouth at bedtime and may repeat dose one time if needed. For sleep and anxiety.   ibuprofen (ADVIL) 800 MG tablet Take 1 tablet (800 mg total) by mouth every 8 (eight) hours as needed.   JUNEL FE 1.5/30 1.5-30 MG-MCG tablet Take 1 tablet by mouth daily.   meclizine (ANTIVERT) 12.5 MG tablet Take 1 tablet (12.5 mg total) by mouth 3 (three) times daily as needed for dizziness. (Patient not taking: Reported on 07/15/2023)   ondansetron (ZOFRAN-ODT) 4 MG disintegrating tablet Take 1 tablet (4 mg total) by mouth every 4-6 hours as needed for nausea or vomiting. (Patient not taking: Reported on 07/15/2023)   pantoprazole (PROTONIX) 40 MG tablet TAKE 1 TABLET(40 MG) BY MOUTH DAILY (Patient not taking: Reported on 07/15/2023)   Prucalopride Succinate (MOTEGRITY) 2 MG  TABS Take 1 tablet (2 mg total) by mouth daily. (Patient not taking: Reported on 07/15/2023)   Rimegepant Sulfate (NURTEC) 75 MG TBDP Take 1 tablet (75 mg total) by mouth every other day. Take one pill at onset of migraine. Max dose 1 pill in 24 hours (Patient not taking: Reported on 07/15/2023)   triamcinolone cream (KENALOG) 0.1 % APPLY TOPICALLY TO THE AFFECTED AREA TWICE DAILY (Patient not taking: Reported on 07/15/2023)   No facility-administered encounter medications on file as of 07/22/2023.   Past Medical History:  Diagnosis Date   Abdominal  pain, epigastric 01/26/2014   Acute recurrent maxillary sinusitis 03/11/2014   Allergy    Anxiety    Chronic constipation    Motegrity helps    Chronic headaches    none x 2-3 yrs   Dyspareunia, female 03/11/2014   GERD (gastroesophageal reflux disease)    HEADACHE 11/30/2007   Qualifier: Diagnosis of  By: Aleene Davidson MD, Merrily Pew    Infection    UTI   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/22/2020   Memory loss 11/30/2007   Qualifier: Diagnosis of   By: Aleene Davidson MD, Yashashwi         Meningitis    Ovarian cyst    Pain 12/08/2013   Dyspareunia    Pelvic pain in female 07/06/2013   Right-sided back pain 09/28/2021   Strep throat 01/23/2022   Tinea versicolor 12/08/2013   Viral pharyngitis 04/02/2022   Past Surgical History:  Procedure Laterality Date   CHOLECYSTECTOMY  2016   COLONOSCOPY     LAPAROSCOPIC APPENDECTOMY N/A 11/10/2020   Procedure: LAPAROSCOPIC APPENDECTOMY;  Surgeon: Rodman Pickle, MD;  Location: [facility];  Service: General;  Laterality: N/A;   UPPER GASTROINTESTINAL ENDOSCOPY  2015   WISDOM TOOTH EXTRACTION           Physical Exam:   Vital Signs: LMP  (LMP Unknown)  GENERAL:  well appearing, in no acute distress, alert  SKIN:  Color, texture, turgor normal. No rashes or lesions HEAD:  Normocephalic/atraumatic. RESP: normal respiratory effort MSK:  No gross joint deformities.   NEUROLOGICAL: Mental Status: Alert, oriented to person, place and time, Follows commands, and Speech fluent and appropriate. Cranial Nerves: PERRL, face symmetric, no dysarthria, hearing grossly intact Motor: moves all extremities equally Gait: normal-based.  IMPRESSION: 38 year old female with a history of viral meningitis (2006) who returns for follow up of migraines.   Her headaches were well-controlled until last week when she developed status migrainosus. This has mostly improved, though she still has a residual headache with exertion. Will give Toradol shot to help  break her current headache cycle. Will continue Ajovy and Nurtec for now.   PLAN: -Toradol shot today -Prevention: Continue Ajovy 225 mg monthly -Rescue: Continue Nurtec 75 mg PRN     Follow-up: 6 months   I spent *** minutes of face-to-face and non-face-to-face time with patient.  This included previsit chart review, lab review, study review, order entry, electronic health record documentation, patient education and discussion regarding above diagnoses and treatment plan and answered all other questions to patient's satisfaction  Ihor Austin, [facility]  [facility] [address] [place], [place] [address]  Phone [phone] Fax [phone] Note: This document was prepared with digital dictation and possible smart phrase technology. Any transcriptional errors that result from this process are unintentional.

## 2023-07-22 ENCOUNTER — Encounter: Payer: Self-pay | Admitting: Adult Health

## 2023-07-22 ENCOUNTER — Ambulatory Visit: Payer: Medicaid Other | Admitting: Adult Health

## 2023-07-22 VITALS — BP 107/73 | HR 83 | Ht 60.0 in | Wt 166.0 lb

## 2023-07-22 DIAGNOSIS — G43719 Chronic migraine without aura, intractable, without status migrainosus: Secondary | ICD-10-CM

## 2023-07-22 MED ORDER — AJOVY 225 MG/1.5ML ~~LOC~~ SOAJ: 1.0000 | SUBCUTANEOUS | 11 refills | Status: AC

## 2023-07-22 MED ORDER — NURTEC 75 MG PO TBDP: 75.0000 mg | ORAL_TABLET | ORAL | 11 refills | Status: AC

## 2023-07-23 ENCOUNTER — Encounter: Payer: Self-pay | Admitting: Nurse Practitioner

## 2023-07-24 ENCOUNTER — Other Ambulatory Visit (HOSPITAL_COMMUNITY)
Admission: RE | Admit: 2023-07-24 | Discharge: 2023-07-24 | Disposition: A | Discharge status: Home / Self Care | Source: Ambulatory Visit | Attending: Obstetrics and Gynecology | Admitting: Obstetrics and Gynecology

## 2023-07-24 ENCOUNTER — Ambulatory Visit (INDEPENDENT_AMBULATORY_CARE_PROVIDER_SITE_OTHER): Admitting: Obstetrics and Gynecology

## 2023-07-24 VITALS — BP 96/68 | HR 77 | Wt 168.6 lb

## 2023-07-24 DIAGNOSIS — N926 Irregular menstruation, unspecified: Secondary | ICD-10-CM | POA: Diagnosis not present

## 2023-07-24 DIAGNOSIS — Z3202 Encounter for pregnancy test, result negative: Secondary | ICD-10-CM | POA: Diagnosis not present

## 2023-07-24 DIAGNOSIS — R102 Pelvic and perineal pain: Secondary | ICD-10-CM | POA: Insufficient documentation

## 2023-07-24 LAB — POCT URINALYSIS DIPSTICK
Bilirubin, UA: NEGATIVE
Blood, UA: NEGATIVE
Glucose, UA: NEGATIVE
Ketones, UA: NEGATIVE
Leukocytes, UA: NEGATIVE
Nitrite, UA: NEGATIVE
Odor: NEGATIVE
Protein, UA: POSITIVE — AB
Spec Grav, UA: 1.015 (ref 1.010–1.025)
Urobilinogen, UA: 0.2 U/dL
pH, UA: 6.5 (ref 5.0–8.0)

## 2023-07-24 LAB — POCT URINE PREGNANCY: Preg Test, Ur: NEGATIVE

## 2023-07-24 NOTE — Progress Notes (Signed)
   GYNECOLOGY PROGRESS NOTE  History:  38 y.o. [id number] presents to [facility] for pelvic pain. She reports the last 2-3 days she has experienced intermittent sharp pain.  She rates the pain 7-8 Report that it will radiate to rectum and last from 10-20 minutes. Changing positions does not help. Denies d/c, itching, odor. Denies urinary symptoms. Denies constipation or bleeding.   Has been on birth control pills for years. Would miss one or two pill, but would always get monthly cycle after that. Doesn't remember getting cycle this past month.    The following portions of the patient's history were reviewed and updated as appropriate: allergies, current medications, past family history, past medical history, past social history, past surgical history and problem list. Last pap smear on 07/10/2021 was normal, neg HRHPV.  Health Maintenance Due  Topic Date Due   Pneumococcal Vaccine 35-64 Years old (1 of 2 - PCV) Never done   COVID-19 Vaccine (4 - 2024-25 season) 01/12/2023     Review of Systems:  Pertinent items are noted in HPI.   Objective:  Physical Exam Blood pressure 96/68, pulse 77, weight 168 lb 9.6 oz (76.5 kg). VS reviewed, nursing note reviewed,  Constitutional: well developed, well nourished, no distress HEENT: normocephalic CV: normal rate Pulm/chest wall: normal effort Breast Exam: deferred Abdomen: soft Neuro: alert and oriented x 3 Skin: warm, dry Psych: affect normal Pelvic exam:Pelvic: normal appearing vulva with no masses, suprapubic tenderness VAGINA: normal appearing vagina with normal color, creamy white discharge no lesions  CERVIX: normal appearing cervix  lesions, no CMT  UTERUS: uterus is felt to be normal size, shape, consistecy  ADNEXA: No adnexal masses or tenderness noted.   Assessment & Plan:  1. Pelvic pain (Primary) Exam normal, neg upt, neg U/A, will follow with swab and u/s - Urine Culture - Cervicovaginal ancillary only( [place]) - US  PELVIC COMPLETE WITH TRANSVAGINAL; Future - POCT Urinalysis Dipstick - POCT urine pregnancy  2. Irregular menstrual cycle  - POCT urine pregnancy  Future Appointments  Date Time Provider Department Center  07/30/2023  4:15 PM Cammy Copa, MD [facility] None  08/15/2023 10:15 AM Early, Sung Amabile, NP [facility]  09/02/2023  3:10 PM Brock Bad, MD [facility] None  02/10/2024  9:45 AM Early, Sung Amabile, NP [facility]  07/22/2024  2:45 PM Ihor Austin, NP [facility] None     Albertine Grates, FNP

## 2023-07-24 NOTE — Progress Notes (Signed)
 Pt is in the office for GYN visit. Currently on BC pills Reports that cycles have been irregular Pt reports for the last 2-3 days she has been experiencing intermittent sharp pain 7/8 that starts as pelvic pain and radiates to her rectum. Pt states that pain lasts about 10-20 minutes and changing from sitting to standing does not help.  Last pap 07/10/2021

## 2023-07-25 ENCOUNTER — Ambulatory Visit: Admitting: Obstetrics

## 2023-07-25 LAB — CERVICOVAGINAL ANCILLARY ONLY
Bacterial Vaginitis (gardnerella): NEGATIVE
Candida Glabrata: NEGATIVE
Candida Vaginitis: NEGATIVE
Chlamydia: NEGATIVE
Comment: NEGATIVE
Comment: NEGATIVE
Comment: NEGATIVE
Comment: NEGATIVE
Comment: NEGATIVE
Comment: NORMAL
Neisseria Gonorrhea: NEGATIVE
Trichomonas: NEGATIVE

## 2023-07-28 ENCOUNTER — Ambulatory Visit: Admitting: Orthopedic Surgery

## 2023-07-30 ENCOUNTER — Ambulatory Visit: Admitting: Orthopedic Surgery

## 2023-08-01 ENCOUNTER — Ambulatory Visit (HOSPITAL_COMMUNITY)
Admission: RE | Admit: 2023-08-01 | Discharge: 2023-08-01 | Disposition: A | Discharge status: Home / Self Care | Source: Ambulatory Visit | Attending: Obstetrics and Gynecology | Admitting: Obstetrics and Gynecology

## 2023-08-01 DIAGNOSIS — R102 Pelvic and perineal pain: Secondary | ICD-10-CM | POA: Diagnosis present

## 2023-08-15 ENCOUNTER — Ambulatory Visit: Payer: Medicaid Other | Admitting: Nurse Practitioner

## 2023-08-19 ENCOUNTER — Other Ambulatory Visit: Payer: Self-pay | Admitting: *Deleted

## 2023-08-19 MED ORDER — PRUCALOPRIDE SUCCINATE 2 MG PO TABS: 2.0000 mg | ORAL_TABLET | Freq: Every day | ORAL | 5 refills | Status: DC

## 2023-08-28 ENCOUNTER — Ambulatory Visit: Admitting: Orthopedic Surgery

## 2023-09-02 ENCOUNTER — Encounter: Payer: Self-pay | Admitting: Obstetrics

## 2023-09-02 ENCOUNTER — Other Ambulatory Visit (HOSPITAL_COMMUNITY)
Admission: RE | Admit: 2023-09-02 | Discharge: 2023-09-02 | Disposition: A | Discharge status: Home / Self Care | Source: Ambulatory Visit | Attending: Obstetrics | Admitting: Obstetrics

## 2023-09-02 ENCOUNTER — Ambulatory Visit: Admitting: Obstetrics

## 2023-09-02 VITALS — BP 109/75 | HR 77 | Wt 170.0 lb

## 2023-09-02 DIAGNOSIS — M545 Low back pain, unspecified: Secondary | ICD-10-CM | POA: Diagnosis not present

## 2023-09-02 DIAGNOSIS — Z01419 Encounter for gynecological examination (general) (routine) without abnormal findings: Secondary | ICD-10-CM

## 2023-09-02 DIAGNOSIS — Z3041 Encounter for surveillance of contraceptive pills: Secondary | ICD-10-CM | POA: Diagnosis not present

## 2023-09-02 DIAGNOSIS — E66811 Obesity, class 1: Secondary | ICD-10-CM

## 2023-09-02 NOTE — Progress Notes (Signed)
 Subjective:        Lydia [name] is a 38 y.o. female here for a routine exam.  Current complaints: Occasional lower back pain.    Personal health questionnaire:  Is [name], have a family history of breast and/or ovarian cancer: yes Is there a family history of uterine cancer diagnosed at age < 91, gastrointestinal cancer, urinary tract cancer, family member who is a Personnel officer syndrome-associated carrier: no Is the patient overweight and hypertensive, family history of diabetes, personal history of gestational diabetes, preeclampsia or PCOS: no Is patient over 76, have PCOS,  family history of premature CHD under age 88, diabetes, smoke, have hypertension or peripheral artery disease:  no At any time, has a partner hit, kicked or otherwise hurt or frightened you?: no Over the past 2 weeks, have you felt down, depressed or hopeless?: no Over the past 2 weeks, have you felt little interest or pleasure in doing things?:no   Gynecologic History Patient's last menstrual period was 08/12/2023 (approximate). Contraception: OCP (estrogen/progesterone) Last Pap: 2023. Results were: normal Last mammogram: n/a. Results were: n/a  Obstetric History OB History  Gravida Para Term Preterm AB Living  4 3 3  0 1 3  SAB IAB Ectopic Multiple Live Births  1 0 0 0 3    # Outcome Date GA Lbr Len/2nd Weight Sex Type Anes PTL Lv  4 Term 08/03/15 [redacted]w[redacted]d 20:25 / 00:40 8 lb 1.5 oz (3.671 kg) M Vag-Spont EPI  LIV  3 Term 12/30/10 [redacted]w[redacted]d 10:50 / 00:29 6 lb 1.4 oz (2.761 kg) F Vag-Spont EPI  LIV     Birth Comments: left arm below elbow missing  2 Term 11/28/04 [redacted]w[redacted]d  6 lb 14 oz (3.118 kg) F Vag-Spont EPI  LIV  1 SAB             Past Medical History:  Diagnosis Date   Abdominal pain, epigastric 01/26/2014   Acute recurrent maxillary sinusitis 03/11/2014   Allergy    Anxiety    Chronic constipation    Motegrity  helps    Chronic headaches    none x 2-3 yrs   Dyspareunia, female  03/11/2014   GERD (gastroesophageal reflux disease)    HEADACHE 11/30/2007   Qualifier: Diagnosis of  By: Kurtis Philips MD, Atanacio Blanch    Infection    UTI   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/22/2020   Memory loss 11/30/2007   Qualifier: Diagnosis of   By: Kurtis Philips MD, Yashashwi         Meningitis    Ovarian cyst    Pain 12/08/2013   Dyspareunia    Pelvic pain in female 07/06/2013   Right-sided back pain 09/28/2021   Strep throat 01/23/2022   Tinea versicolor 12/08/2013   Viral pharyngitis 04/02/2022    Past Surgical History:  Procedure Laterality Date   CHOLECYSTECTOMY  2016   COLONOSCOPY     LAPAROSCOPIC APPENDECTOMY N/A 11/10/2020   Procedure: LAPAROSCOPIC APPENDECTOMY;  Surgeon: Derral Flick, MD;  Location: [facility];  Service: General;  Laterality: N/A;   UPPER GASTROINTESTINAL ENDOSCOPY  2015   WISDOM TOOTH EXTRACTION       Current Outpatient Medications:    atorvastatin  (LIPITOR) 20 MG tablet, TAKE 1 TABLET(20 MG) BY MOUTH DAILY, Disp: 90 tablet, Rfl: 1   celecoxib  (CELEBREX ) 100 MG capsule, Take 1 capsule (100 mg total) by mouth 2 (two) times daily., Disp: 60 capsule, Rfl: 3   cetirizine  (ZYRTEC  ALLERGY) 10 MG tablet, Take  1 tablet (10 mg total) by mouth daily., Disp: 90 tablet, Rfl: 1   Fremanezumab -vfrm (AJOVY ) 225 MG/1.5ML SOAJ, Inject 1 Pen into the skin every 30 (thirty) days., Disp: 1.68 mL, Rfl: 11   gabapentin  (NEURONTIN ) 100 MG capsule, Take 1-3 capsules (100-300 mg total) by mouth at bedtime. For numbness and tingling in the arms., Disp: 90 capsule, Rfl: 3   hydrocortisone  (ANUSOL -HC) 25 MG suppository, Place 1 suppository (25 mg total) rectally 2 (two) times daily., Disp: 12 suppository, Rfl: 0   hydrOXYzine  (VISTARIL ) 50 MG capsule, Take 1 capsule (50 mg total) by mouth at bedtime and may repeat dose one time if needed. For sleep and anxiety., Disp: 60 capsule, Rfl: 6   ibuprofen  (ADVIL ) 800 MG tablet, Take 1 tablet (800 mg total) by mouth every 8 (eight) hours as  needed., Disp: 30 tablet, Rfl: 5   JUNEL  FE 1.5/30 1.5-30 MG-MCG tablet, Take 1 tablet by mouth daily., Disp: 28 tablet, Rfl: 12   meclizine  (ANTIVERT ) 12.5 MG tablet, Take 1 tablet (12.5 mg total) by mouth 3 (three) times daily as needed for dizziness., Disp: 30 tablet, Rfl: 0   ondansetron  (ZOFRAN -ODT) 4 MG disintegrating tablet, Take 1 tablet (4 mg total) by mouth every 4-6 hours as needed for nausea or vomiting., Disp: 15 tablet, Rfl: 1   Prucalopride Succinate  (MOTEGRITY ) 2 MG TABS, Take 1 tablet (2 mg total) by mouth daily., Disp: 30 tablet, Rfl: 5   Rimegepant Sulfate (NURTEC) 75 MG TBDP, Take 1 tablet (75 mg total) by mouth every other day. Take one pill at onset of migraine. Max dose 1 pill in 24 hours, Disp: 16 tablet, Rfl: 11   triamcinolone  cream (KENALOG ) 0.1 %, APPLY TOPICALLY TO THE AFFECTED AREA TWICE DAILY, Disp: 45 g, Rfl: 0 Allergies  Allergen Reactions   Penicillins Hives, Itching and Rash    Has patient had a PCN reaction causing immediate rash, facial/tongue/throat swelling, SOB or lightheadedness with hypotension: Yes Has patient had a PCN reaction causing severe rash involving mucus membranes or skin necrosis: Yes Has patient had a PCN reaction that required hospitalization no Has patient had a PCN reaction occurring within the last 10 years: No If all of the above answers are "NO", then may proceed with Cephalosporin use. Has patient had a PCN reaction causing immediate rash, facial/tongue/throat swelling, SOB or lightheadedness with hypotension: Yes Has patient had a PCN reaction causing severe rash involving mucus membranes or skin necrosis: Yes Has patient had a PCN reaction that required hospitalization no Has patient had a PCN reaction occurring within the last 10 years: No If all of the above answers are "NO", then may proceed with Cephalosporin use.    Social History   Tobacco Use   Smoking status: Never   Smokeless tobacco: Never  Substance Use Topics    Alcohol use: Not Currently    Comment: occ    Family History  Problem Relation Age of Onset   Migraines Mother    Hypertension Mother    Asthma Father    Birth defects Daughter        Limb reduction defect   Breast cancer Maternal Aunt 34   Colon cancer Neg Hx    Esophageal cancer Neg Hx    Rectal cancer Neg Hx    Stomach cancer Neg Hx    Colon polyps Neg Hx       Review of Systems  Constitutional: negative for fatigue and weight loss Respiratory: negative for cough and wheezing Cardiovascular:  negative for chest pain, fatigue and palpitations Gastrointestinal: negative for abdominal pain and change in bowel habits Musculoskeletal: positive for myalgias - lower midline back pain Neurological: negative for gait problems and tremors Behavioral/Psych: negative for abusive relationship, depression Endocrine: negative for temperature intolerance    Genitourinary:negative for abnormal menstrual periods, genital lesions, hot flashes, sexual problems and vaginal discharge Integument/breast: negative for breast lump, breast tenderness, nipple discharge and skin lesion(s)    Objective:       BP 109/75   Pulse 77   Wt 170 lb (77.1 kg)   LMP 08/12/2023 (Approximate)   BMI 33.20 kg/m  General:   Alert and no distress  Skin:   no rash or abnormalities  Lungs:   clear to auscultation bilaterally  Heart:   regular rate and rhythm, S1, S2 normal, no murmur, click, rub or gallop  Breasts:   normal without suspicious masses, skin or nipple changes or axillary nodes  Abdomen:  normal findings: no organomegaly, soft, non-tender and no hernia  Pelvis:  External genitalia: normal general appearance Urinary system: urethral meatus normal and bladder without fullness, nontender Vaginal: normal without tenderness, induration or masses Cervix: normal appearance Adnexa: normal bimanual exam Uterus: anteverted and non-tender, normal size   Lab Review Urine pregnancy test Labs reviewed  yes Radiologic studies reviewed no  I have spent a total of 20 minutes of face-to-face time, excluding clinical staff time, reviewing notes and preparing to see patient, ordering tests and/or medications, and counseling the patient.   Assessment:    1. Encounter for gynecological examination with Papanicolaou smear of cervix (Primary) Rx: - Cytology - PAP( Jeff)  2. Encounter for surveillance of contraceptive pills - doing well  3. Midline low back pain without sciatica, unspecified chronicity - Ortho follow up  4. Obesity (BMI 30.0-34.9) - weight reduction with the aid of dietary changes, exercise and behavioral modification recommended     Plan:    Education reviewed: calcium  supplements, depression evaluation, low fat, low cholesterol diet, safe sex/STD prevention, self breast exams, and weight bearing exercise. Contraception: OCP (estrogen/progesterone). Follow up in: 1 year.    Gabrielle Joiner, MD, FACOG Attending Obstetrician & Gynecologist, [facility], [facility], [place] 09/02/2023

## 2023-09-04 LAB — CYTOLOGY - PAP: Diagnosis: NEGATIVE

## 2023-09-10 ENCOUNTER — Ambulatory Visit: Admitting: Orthopedic Surgery

## 2023-09-15 ENCOUNTER — Telehealth: Payer: Self-pay | Admitting: *Deleted

## 2023-09-15 ENCOUNTER — Encounter: Payer: Self-pay | Admitting: Adult Health

## 2023-09-15 ENCOUNTER — Other Ambulatory Visit (HOSPITAL_COMMUNITY): Payer: Self-pay

## 2023-09-15 ENCOUNTER — Telehealth: Payer: Self-pay

## 2023-09-15 NOTE — Telephone Encounter (Signed)
 Pharmacy Patient Advocate Encounter  Received notification from [facility] MEDICAID that Prior Authorization for AJOVY  (fremanezumab -vfrm) injection 225MG /1.5ML auto-injectors has been APPROVED from 09/15/2023 to 09/14/2024. Ran test claim, Copay is $4.00. This test claim was processed through [facility]- copay amounts may vary at other pharmacies due to pharmacy/plan contracts, or as the patient moves through the different stages of their insurance plan.   PA #/Case ID/Reference #: PA Case ID #: [id number]

## 2023-09-15 NOTE — Telephone Encounter (Signed)
 PA Ajovy needed per pt

## 2023-09-15 NOTE — Telephone Encounter (Signed)
 Pharmacy Patient Advocate Encounter   Received notification from Physician's Office that prior authorization for AJOVY  (fremanezumab -vfrm) injection 225MG /1.5ML auto-injectors is required/requested.   Insurance verification completed.   The patient is insured through [facility] MEDICAID .   Per test claim: PA required; PA submitted to above mentioned insurance via CoverMyMeds Key/confirmation #/EOC [id number] Status is pending

## 2023-09-17 ENCOUNTER — Telehealth: Admitting: Nurse Practitioner

## 2023-09-17 DIAGNOSIS — R3989 Other symptoms and signs involving the genitourinary system: Secondary | ICD-10-CM | POA: Diagnosis not present

## 2023-09-17 DIAGNOSIS — B3731 Acute candidiasis of vulva and vagina: Secondary | ICD-10-CM

## 2023-09-17 MED ORDER — NITROFURANTOIN MONOHYD MACRO 100 MG PO CAPS: 100.0000 mg | ORAL_CAPSULE | Freq: Two times a day (BID) | ORAL | 0 refills | Status: AC

## 2023-09-17 MED ORDER — FLUCONAZOLE 150 MG PO TABS: 150.0000 mg | ORAL_TABLET | Freq: Once | ORAL | 0 refills | Status: AC

## 2023-09-17 NOTE — Progress Notes (Signed)
 E-Visit for Urinary Problems  We are sorry that you are not feeling well.  Here is how we plan to help!  Based on what you shared with me it looks like you most likely have a simple urinary tract infection.  A UTI (Urinary Tract Infection) is a bacterial infection of the bladder.  Most cases of urinary tract infections are simple to treat but a key part of your care is to encourage you to drink plenty of fluids and watch your symptoms carefully.  I have prescribed MacroBid  100 mg twice a day for 5 days.  Your symptoms should gradually improve. Call us  if the burning in your urine worsens, you develop worsening fever, back pain or pelvic pain or if your symptoms do not resolve after completing the antibiotic.  We will also prescribe something for your vaginal yeast symptoms  Meds ordered this encounter  Medications   fluconazole  (DIFLUCAN ) 150 MG tablet    Sig: Take 1 tablet (150 mg total) by mouth once for 1 dose.    Dispense:  1 tablet    Refill:  0     Urinary tract infections can be prevented by drinking plenty of water to keep your body hydrated.  Also be sure when you wipe, wipe from front to back and don't hold it in!  If possible, empty your bladder every 4 hours.  HOME CARE Drink plenty of fluids Compete the full course of the antibiotics even if the symptoms resolve Remember, when you need to go.go. Holding in your urine can increase the likelihood of getting a UTI! GET HELP RIGHT AWAY IF: You cannot urinate You get a high fever Worsening back pain occurs You see blood in your urine You feel sick to your stomach or throw up You feel like you are going to pass out  MAKE SURE YOU  Understand these instructions. Will watch your condition. Will get help right away if you are not doing well or get worse.   Thank you for choosing an e-visit.  Your e-visit answers were reviewed by a board certified advanced clinical practitioner to complete your personal care plan.  Depending upon the condition, your plan could have included both over the counter or prescription medications.  Please review your pharmacy choice. Make sure the pharmacy is open so you can pick up prescription now. If there is a problem, you may contact your provider through [employer] and have the prescription routed to another pharmacy.  Your safety is important to us . If you have drug allergies check your prescription carefully.   For the next 24 hours you can use MyChart to ask questions about today's visit, request a non-urgent call back, or ask for a work or school excuse. You will get an email in the next two days asking about your experience. I hope that your e-visit has been valuable and will speed your recovery.  I spent approximately 5 minutes reviewing the patient's history, current symptoms and coordinating their care today.

## 2023-09-24 ENCOUNTER — Telehealth: Admitting: Family Medicine

## 2023-09-24 DIAGNOSIS — J029 Acute pharyngitis, unspecified: Secondary | ICD-10-CM | POA: Diagnosis not present

## 2023-09-24 NOTE — Progress Notes (Signed)
E-Visit for Sore Throat  We are sorry that you are not feeling well.  Here is how we plan to help!  Your symptoms indicate a likely viral infection (Pharyngitis).   Pharyngitis is inflammation in the back of the throat which can cause a sore throat, scratchiness and sometimes difficulty swallowing.   Pharyngitis is typically caused by a respiratory virus and will just run its course.  Please keep in mind that your symptoms could last up to 10 days.  For throat pain, we recommend over the counter oral pain relief medications such as acetaminophen or aspirin, or anti-inflammatory medications such as ibuprofen or naproxen sodium.  Topical treatments such as oral throat lozenges or sprays may be used as needed.  Avoid close contact with loved ones, especially the very young and elderly.  Remember to wash your hands thoroughly throughout the day as this is the number one way to prevent the spread of infection and wipe down door knobs and counters with disinfectant.  After careful review of your answers, I would not recommend an antibiotic for your condition.  Antibiotics should not be used to treat conditions that we suspect are caused by viruses like the virus that causes the common cold or flu. However, some people can have Strep with atypical symptoms. You may need formal testing in clinic or office to confirm if your symptoms continue or worsen.  Providers prescribe antibiotics to treat infections caused by bacteria. Antibiotics are very powerful in treating bacterial infections when they are used properly.  To maintain their effectiveness, they should be used only when necessary.  Overuse of antibiotics has resulted in the development of super bugs that are resistant to treatment!    Home Care: Only take medications as instructed by your medical team. Do not drink alcohol while taking these medications. A steam or ultrasonic humidifier can help congestion.  You can place a towel over your head and  breathe in the steam from hot water coming from a faucet. Avoid close contacts especially the very young and the elderly. Cover your mouth when you cough or sneeze. Always remember to wash your hands.  Get Help Right Away If: You develop worsening fever or throat pain. You develop a severe head ache or visual changes. Your symptoms persist after you have completed your treatment plan.  Make sure you Understand these instructions. Will watch your condition. Will get help right away if you are not doing well or get worse.   Thank you for choosing an e-visit.  Your e-visit answers were reviewed by a board certified advanced clinical practitioner to complete your personal care plan. Depending upon the condition, your plan could have included both over the counter or prescription medications.  Please review your pharmacy choice. Make sure the pharmacy is open so you can pick up prescription now. If there is a problem, you may contact your provider through MyChart messaging and have the prescription routed to another pharmacy.  Your safety is important to us. If you have drug allergies check your prescription carefully.   For the next 24 hours you can use MyChart to ask questions about today's visit, request a non-urgent call back, or ask for a work or school excuse. You will get an email in the next two days asking about your experience. I hope that your e-visit has been valuable and will speed your recovery.   I provided 5 minutes of non face-to-face time during this encounter for chart review, medication and order placement, as   well as and documentation.   

## 2023-09-25 ENCOUNTER — Ambulatory Visit: Admitting: Orthopedic Surgery

## 2023-10-21 ENCOUNTER — Encounter: Payer: Self-pay | Admitting: Nurse Practitioner

## 2023-10-21 ENCOUNTER — Ambulatory Visit: Admitting: Nurse Practitioner

## 2023-10-21 ENCOUNTER — Telehealth: Payer: Self-pay | Admitting: Licensed Clinical Social Worker

## 2023-10-21 VITALS — BP 120/70 | HR 68 | Wt 169.8 lb

## 2023-10-21 DIAGNOSIS — L299 Pruritus, unspecified: Secondary | ICD-10-CM | POA: Diagnosis not present

## 2023-10-21 DIAGNOSIS — G629 Polyneuropathy, unspecified: Secondary | ICD-10-CM

## 2023-10-21 DIAGNOSIS — R55 Syncope and collapse: Secondary | ICD-10-CM | POA: Insufficient documentation

## 2023-10-21 HISTORY — DX: Syncope and collapse: R55

## 2023-10-21 MED ORDER — FREESTYLE LIBRE 3 PLUS SENSOR MISC: 2 refills | Status: DC

## 2023-10-21 NOTE — Patient Instructions (Signed)
 I want to check some labs to make sure that this is not something as simple as an electrolyte imbalance or vitamin deficiency.   We may need to check with neurology to get their input if this is not revealing.

## 2023-10-21 NOTE — Progress Notes (Signed)
 Lydia Kief, DNP, AGNP-c [facility] [address] [place], [place] [address] [phone]   ACUTE VISIT- ESTABLISHED PATIENT  Blood pressure 120/70, pulse 68, weight 169 lb 12.8 oz (77 kg).  Subjective:  HPI History of Present Illness Lydia [name] is a 38 year old female who presents with tingling in the right foot and leg, accompanied by lightheadedness.  She has been experiencing tingling sensations that start in her right foot and extend to her leg, primarily affecting the outside of the calf. These episodes have been occurring for about three weeks, lasting approximately ten minutes each time. The symptoms occur intermittently, with no episodes noted in the past week, but previously occurring two to three times a week. The tingling and lightheadedness have occurred while sitting, standing, and walking, and can happen at any time of the day. Both feet feel heavy, although this sensation is not always present during the tingling episodes.  During these episodes, she feels lightheaded but has not experienced any actual fainting. No weakness in the leg, blurred vision, tunnel vision, cold sweats, nausea, chest pain, palpitations, shortness of breath, or headaches. She has experienced occasional tinnitus, not concurrent with the tingling episodes. There is no correlation with meals or stress, although she acknowledges increased stress due to her daughter's upcoming graduation. She has not noticed any issues with balance or hyperventilation during the episodes.  She has a history of low vitamin D  and normal B12 levels. She has previously been prescribed medication for hyperlipidemia but has discontinued it. She experiences frequent pruritus without any visible rash, which she describes as 'the itch doesn't go away'.  She has a history of a small disc protrusion at the L5-S1 level and reports that her pain is usually in the lower part of her back where the disc protrusion is,  although her leg has not been bothering her recently. She has been seen by neurology for headaches, which are currently well controlled.    ROS negative except for what is listed in HPI. History, Medications, Surgery, SDOH, and Family History reviewed and updated as appropriate.  Objective:  Physical Exam Vitals and nursing note reviewed.  Constitutional:      General: She is not in acute distress.    Appearance: Normal appearance. She is not ill-appearing.  HENT:     Head: Normocephalic and atraumatic.     Right Ear: Tympanic membrane normal.     Left Ear: Tympanic membrane normal.   Eyes:     Extraocular Movements: Extraocular movements intact.     Conjunctiva/sclera: Conjunctivae normal.     Pupils: Pupils are equal, round, and reactive to light.    Cardiovascular:     Rate and Rhythm: Normal rate and regular rhythm.     Pulses: Normal pulses.     Heart sounds: Normal heart sounds.  Pulmonary:     Effort: Pulmonary effort is normal.     Breath sounds: Normal breath sounds.  Abdominal:     Palpations: Abdomen is soft.   Musculoskeletal:        General: Normal range of motion.     Cervical back: Neck supple. No tenderness.     Right lower leg: No edema.     Left lower leg: No edema.   Skin:    General: Skin is warm and dry.     Capillary Refill: Capillary refill takes less than 2 seconds.   Neurological:     Mental Status: She is alert and oriented to person, place,  and time.     Cranial Nerves: Cranial nerves 2-12 are intact.     Sensory: Sensory deficit present.     Motor: No weakness.     Coordination: Coordination normal.     Gait: Gait normal.     Deep Tendon Reflexes: Reflexes normal.     Comments: Sensation on the right lower extremity is reportedly less than the left. No strength deficit. Skin appearance same. Deficit is limited to the lateral and posterior portion of the calf and lateral thigh proximal to the knee.   Psychiatric:        Mood and Affect:  Mood normal.        Behavior: Behavior normal.         Assessment & Plan:   Problem List Items Addressed This Visit     Pre-syncope - Primary   Intermittent lightheadedness that started with leg paresthesia lasting about 10 minutes per episode. Positioning does not seem to aggravate or alleviate symptoms. No associated weakness, blurred vision, or headaches.The exact cause of dizziness remains unclear. No evidence of blood pressure or blood sugar correlation.iscussed possibility of vasovagal reaction triggered by anxiety or nerve pain. Proposed continuous glucose monitor to rule out blood sugar fluctuations. See neuropathy for more.       Relevant Medications   Continuous Glucose Sensor (FREESTYLE LIBRE 3 PLUS SENSOR) MISC   Other Relevant Orders   CBC with Differential/Platelet (Completed)   Comprehensive metabolic panel with GFR (Completed)   Iron, TIBC and Ferritin Panel (Completed)   TSH (Completed)   Vitamin B12 (Completed)   VITAMIN D  25 Hydroxy (Vit-D Deficiency, Fractures) (Completed)   Neuropathy   Intermittent tingling in the right foot and leg with lightheadedness over the past three weeks. Episodes last about ten minutes in various positions (sitting, standing, walking). No associated weakness, blurred vision, or headaches. Differential diagnosis includes neuropathy due to a small disc protrusion at L5-S1, electrolyte imbalance, or vasovagal reaction. The exact cause of dizziness remains unclear. No evidence of blood pressure or blood sugar correlation. Consider nerve conduction study and neurology consultation. Discussed possibility of vasovagal reaction triggered by anxiety or nerve pain. Proposed continuous glucose monitor to rule out blood sugar fluctuations. - Order labs for potassium, calcium , and electrolytes - Apply continuous glucose monitor for two weeks to assess blood sugar levels during episodes - Document detailed account of episodes, including potential triggers  or associated symptoms - Consult neurology for further evaluation and potential nerve conduction study      Relevant Medications   Continuous Glucose Sensor (FREESTYLE LIBRE 3 PLUS SENSOR) MISC   Other Relevant Orders   CBC with Differential/Platelet (Completed)   Comprehensive metabolic panel with GFR (Completed)   Iron, TIBC and Ferritin Panel (Completed)   TSH (Completed)   Vitamin B12 (Completed)   VITAMIN D  25 Hydroxy (Vit-D Deficiency, Fractures) (Completed)   Itching   Persistent itching without rash, possibly nerve-related. No clear association with atorvastatin  or other medications. Further investigation needed.         Roya Gieselman E Kumar Falwell, DNP, AGNP-c

## 2023-10-21 NOTE — Telephone Encounter (Signed)
 [facility] contacted patient on this date to provide [facility] intro and to schedule [facility] appointment. [facility] left VM.

## 2023-10-22 ENCOUNTER — Telehealth: Payer: Self-pay

## 2023-10-22 ENCOUNTER — Ambulatory Visit: Payer: Self-pay | Admitting: Nurse Practitioner

## 2023-10-22 ENCOUNTER — Other Ambulatory Visit (HOSPITAL_COMMUNITY): Payer: Self-pay

## 2023-10-22 LAB — CBC WITH DIFFERENTIAL/PLATELET
Basophils Absolute: 0 x10E3/uL (ref 0.0–0.2)
Basos: 0 %
EOS (ABSOLUTE): 0.1 x10E3/uL (ref 0.0–0.4)
Eos: 1 %
Hematocrit: 39.3 % (ref 34.0–46.6)
Hemoglobin: 12.9 g/dL (ref 11.1–15.9)
Immature Grans (Abs): 0 x10E3/uL (ref 0.0–0.1)
Immature Granulocytes: 0 %
Lymphocytes Absolute: 3.9 x10E3/uL — ABNORMAL HIGH (ref 0.7–3.1)
Lymphs: 38 %
MCH: 29.6 pg (ref 26.6–33.0)
MCHC: 32.8 g/dL (ref 31.5–35.7)
MCV: 90 fL (ref 79–97)
Monocytes Absolute: 0.4 x10E3/uL (ref 0.1–0.9)
Monocytes: 4 %
Neutrophils Absolute: 5.7 x10E3/uL (ref 1.4–7.0)
Neutrophils: 57 %
Platelets: 426 x10E3/uL (ref 150–450)
RBC: 4.36 x10E6/uL (ref 3.77–5.28)
RDW: 11.8 % (ref 11.7–15.4)
WBC: 10.1 x10E3/uL (ref 3.4–10.8)

## 2023-10-22 LAB — IRON,TIBC AND FERRITIN PANEL
Ferritin: 88 ng/mL (ref 15–150)
Iron Saturation: 25 % (ref 15–55)
Iron: 100 ug/dL (ref 27–159)
Total Iron Binding Capacity: 404 ug/dL (ref 250–450)
UIBC: 304 ug/dL (ref 131–425)

## 2023-10-22 LAB — VITAMIN B12: Vitamin B-12: 538 pg/mL (ref 232–1245)

## 2023-10-22 LAB — COMPREHENSIVE METABOLIC PANEL WITH GFR
ALT: 8 IU/L (ref 0–32)
AST: 13 IU/L (ref 0–40)
Albumin: 4.2 g/dL (ref 3.9–4.9)
Alkaline Phosphatase: 53 IU/L (ref 44–121)
BUN/Creatinine Ratio: 14 (ref 9–23)
BUN: 9 mg/dL (ref 6–20)
Bilirubin Total: 0.4 mg/dL (ref 0.0–1.2)
CO2: 20 mmol/L (ref 20–29)
Calcium: 9.7 mg/dL (ref 8.7–10.2)
Chloride: 102 mmol/L (ref 96–106)
Creatinine, Ser: 0.64 mg/dL (ref 0.57–1.00)
Globulin, Total: 2.2 g/dL (ref 1.5–4.5)
Glucose: 96 mg/dL (ref 70–99)
Potassium: 4.3 mmol/L (ref 3.5–5.2)
Sodium: 136 mmol/L (ref 134–144)
Total Protein: 6.4 g/dL (ref 6.0–8.5)
eGFR: 117 mL/min/1.73

## 2023-10-22 LAB — VITAMIN D 25 HYDROXY (VIT D DEFICIENCY, FRACTURES): Vit D, 25-Hydroxy: 42.3 ng/mL (ref 30.0–100.0)

## 2023-10-22 LAB — TSH: TSH: 1.78 u[IU]/mL (ref 0.450–4.500)

## 2023-10-22 NOTE — Telephone Encounter (Signed)
 Pharmacy Patient Advocate Encounter   Received notification from CoverMyMeds that prior authorization for FreeStyle Libre 3 Plus Sensor is required/requested.   Insurance verification completed.   The patient is insured through [facility].   Per test claim: PA required; PA submitted to above mentioned insurance via CoverMyMeds Key/confirmation #/EOC  (Key: [id number])    Status is pending

## 2023-10-22 NOTE — Telephone Encounter (Signed)
 Pharmacy Patient Advocate Encounter  Received notification from [facility] that Prior Authorization for FreeStyle Libre 3 Plus Sensor  has been DENIED.  Full denial letter will be uploaded to the media tab. See denial reason below.  Per pts plan: pt must be Insulin Dependent         PA #/Case ID/Reference #: (Key: [id number]

## 2023-10-29 NOTE — Assessment & Plan Note (Signed)
 Intermittent tingling in the right foot and leg with lightheadedness over the past three weeks. Episodes last about ten minutes in various positions (sitting, standing, walking). No associated weakness, blurred vision, or headaches. Differential diagnosis includes neuropathy due to a small disc protrusion at L5-S1, electrolyte imbalance, or vasovagal reaction. The exact cause of dizziness remains unclear. No evidence of blood pressure or blood sugar correlation. Consider nerve conduction study and neurology consultation. Discussed possibility of vasovagal reaction triggered by anxiety or nerve pain. Proposed continuous glucose monitor to rule out blood sugar fluctuations. - Order labs for potassium, calcium , and electrolytes - Apply continuous glucose monitor for two weeks to assess blood sugar levels during episodes - Document detailed account of episodes, including potential triggers or associated symptoms - Consult neurology for further evaluation and potential nerve conduction study

## 2023-10-29 NOTE — Assessment & Plan Note (Signed)
 Persistent itching without rash, possibly nerve-related. No clear association with atorvastatin  or other medications. Further investigation needed.

## 2023-10-29 NOTE — Assessment & Plan Note (Signed)
 Intermittent lightheadedness that started with leg paresthesia lasting about 10 minutes per episode. Positioning does not seem to aggravate or alleviate symptoms. No associated weakness, blurred vision, or headaches.The exact cause of dizziness remains unclear. No evidence of blood pressure or blood sugar correlation.iscussed possibility of vasovagal reaction triggered by anxiety or nerve pain. Proposed continuous glucose monitor to rule out blood sugar fluctuations. See neuropathy for more.

## 2023-11-01 ENCOUNTER — Encounter: Payer: Self-pay | Admitting: Nurse Practitioner

## 2023-11-03 ENCOUNTER — Ambulatory Visit (INDEPENDENT_AMBULATORY_CARE_PROVIDER_SITE_OTHER): Admitting: Orthopedic Surgery

## 2023-11-03 ENCOUNTER — Encounter: Payer: Self-pay | Admitting: Orthopedic Surgery

## 2023-11-03 ENCOUNTER — Other Ambulatory Visit: Payer: Self-pay

## 2023-11-03 DIAGNOSIS — L989 Disorder of the skin and subcutaneous tissue, unspecified: Secondary | ICD-10-CM

## 2023-11-03 DIAGNOSIS — M545 Low back pain, unspecified: Secondary | ICD-10-CM | POA: Diagnosis not present

## 2023-11-03 NOTE — Progress Notes (Signed)
 Office Visit Note   Patient: Lydia [name]           Date of Birth: 10-16-1985           MRN: [id number] Visit Date: 11/03/2023 Requested by: Oris Camie BRAVO, NP [address] [place],  [place] [address] PCP: Oris Camie BRAVO, NP  Subjective: Chief Complaint  Patient presents with   Lower Back - Pain    HPI: Velta A Edmunds is a 38 y.o. female who presents to the office reporting low back pain.  Radiographs done 05/27/2022.  MRI of the lumbar spine done 08/01/2022.  That MRI scan showed mild bilateral facet arthropathy at L5-S1 without stenosis or significant neuroforaminal narrowing.  She states that nothing really helps her back.  She reports tingling in the right leg going down to the foot.  There is pain in the right leg and she is not having low back pain.  She has not had a previous epidural steroid injection.  Does report some weakness in the right leg.  Motrin  does not help.  Also has knee pain on the right along with leg pain..                ROS: All systems reviewed are negative as they relate to the chief complaint within the history of present illness.  Patient denies fevers or chills.  Assessment & Plan: Visit Diagnoses:  1. Low back pain, unspecified back pain laterality, unspecified chronicity, unspecified whether sciatica present     Plan: Impression is unclear etiology of her back pain and leg pain.  No real tenderness around the fibular head.  Having plantar paresthesias in the right foot.  Nothing really going on in the left leg.  Plan at this time is nerve conduction study to evaluate nerve compression versus neuropathy.  MRI of the lumbar spine is underwhelming.  Follow-Up Instructions: No follow-ups on file.   Orders:  Orders Placed This Encounter  Procedures   Ambulatory referral to Physical Medicine Rehab   No orders of the defined types were placed in this encounter.     Procedures: No procedures performed   Clinical Data: No additional  findings.  Objective: Vital Signs: There were no vitals taken for this visit.  Physical Exam:  Constitutional: Patient appears well-developed HEENT:  Head: Normocephalic Eyes:EOM are normal Neck: Normal range of motion Cardiovascular: Normal rate Pulmonary/chest: Effort normal Neurologic: Patient is alert Skin: Skin is warm Psychiatric: Patient has normal mood and affect  Ortho Exam: Ortho exam demonstrates no nerve root tension signs.  Does have some paresthesias L4-S1 on the right compared to the left.  Pedal pulse palpable.  Patient has 5 out of 5 ankle dorsiflexion plantarflexion quad and hamstring strength with also intact foot eversion and inversion.  No other masses lymphadenopathy or skin changes noted in that right leg or foot region.  Specialty Comments:  No specialty comments available.  Imaging: No results found.   PMFS History: Patient Active Problem List   Diagnosis Date Noted   Pre-syncope 10/21/2023   Neuropathy 10/21/2023   Itching 10/21/2023   Chronic bilateral low back pain without sciatica 07/02/2023   Chronic pain of both knees 07/02/2023   Pain in both wrists 07/02/2023   Arthritis 07/02/2023   Subcutaneous nodule of left forearm 06/09/2023   Gastroesophageal reflux disease 02/05/2023   Dizziness 07/15/2022   Reflux pharyngitis 04/02/2022   Chronic cystitis 12/17/2021   Somatic dysfunction of spine, lumbar 09/28/2021   Insomnia 07/09/2021  Panic attack 06/06/2021   Paresthesias 06/06/2021   Generalized anxiety disorder 04/20/2021   Chronic daily headache 04/20/2021   Chronic idiopathic constipation 03/29/2021   Family history of congenital anomalies    Past Medical History:  Diagnosis Date   Abdominal pain, epigastric 01/26/2014   Acute recurrent maxillary sinusitis 03/11/2014   Allergy    Anxiety    Chronic constipation    [employer]  helps    Chronic headaches    none x 2-3 yrs   Dyspareunia, female 03/11/2014   GERD  (gastroesophageal reflux disease)    HEADACHE 11/30/2007   Qualifier: Diagnosis of  By: Joannie MD, Rosy Hunt    Infection    UTI   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/22/2020   Memory loss 11/30/2007   Qualifier: Diagnosis of   By: Joannie MD, Yashashwi         Meningitis    Ovarian cyst    Pain 12/08/2013   Dyspareunia    Pelvic pain in female 07/06/2013   Right-sided back pain 09/28/2021   Strep throat 01/23/2022   Tinea versicolor 12/08/2013   Viral pharyngitis 04/02/2022    Family History  Problem Relation Age of Onset   Migraines Mother    Hypertension Mother    Asthma Father    Birth defects Daughter        Limb reduction defect   Breast cancer Maternal Aunt 42   Colon cancer Neg Hx    Esophageal cancer Neg Hx    Rectal cancer Neg Hx    Stomach cancer Neg Hx    Colon polyps Neg Hx     Past Surgical History:  Procedure Laterality Date   CHOLECYSTECTOMY  2016   COLONOSCOPY     LAPAROSCOPIC APPENDECTOMY N/A 11/10/2020   Procedure: LAPAROSCOPIC APPENDECTOMY;  Surgeon: Kinsinger, Herlene Righter, MD;  Location: [facility];  Service: General;  Laterality: N/A;   UPPER GASTROINTESTINAL ENDOSCOPY  2015   WISDOM TOOTH EXTRACTION     Social History   Occupational History   Occupation: Stay at home mom  Tobacco Use   Smoking status: Never   Smokeless tobacco: Never  Vaping Use   Vaping status: Never Used  Substance and Sexual Activity   Alcohol use: Not Currently    Comment: occ   Drug use: No   Sexual activity: Yes    Partners: Male    Birth control/protection: Pill, OCP

## 2023-11-13 ENCOUNTER — Ambulatory Visit: Admitting: Medical

## 2023-11-13 VITALS — BP 120/70 | HR 62 | Temp 98.2°F | Wt 168.6 lb

## 2023-11-13 DIAGNOSIS — L989 Disorder of the skin and subcutaneous tissue, unspecified: Secondary | ICD-10-CM

## 2023-11-13 MED ORDER — MUPIROCIN 2 % EX OINT: 1.0000 | TOPICAL_OINTMENT | Freq: Two times a day (BID) | CUTANEOUS | 0 refills | Status: DC

## 2023-11-13 NOTE — Patient Instructions (Signed)
 Recommendations: Begin daily over the counter astringent cleanser Use warm moist compresses 20 minutes twice daily Begin mupirocin ointment twice daily for 7-10 days If desired you can add small layer of Cortaid hydrocortisone  cream daily at lunch to the 2 separate lesions If not improving in 7-10 days, then see your eye doctor

## 2023-11-13 NOTE — Progress Notes (Signed)
 Subjective:  Lydia [name] is a 38 y.o. female who presents for Chief Complaint  Patient presents with   Acute Visit    Right eye, has 2 small bumps that are starting to hurt. Been there like 6 months.      Here for skin lesions.  In the last several months she has had 2 skin lesions of her face over the right eyelid.  They have been unchanged for a while but in the last few days they have been giving her some irritation.  No pus, no drainage, no fever, no redness.  No itching.  Otherwise normal state of health.  She wears glasses and has seen her eye doctor in the last several months No other aggravating or relieving factors.    No other c/o.  The following portions of the patient's history were reviewed and updated as appropriate: allergies, current medications, past family history, past medical history, past social history, past surgical history and problem list.  ROS Otherwise as in subjective above    Objective: BP 120/70   Pulse 62   Temp 98.2 F (36.8 C)   Wt 168 lb 9.6 oz (76.5 kg)   SpO2 97%   BMI 32.93 kg/m   General appearance: alert, no distress, well developed, well nourished Right upper eyelid with 2 lesions.  1 is on the medial eyelid upper, raised lesion approximately 2 to 3 mm flesh-colored, similar smaller 2 mm lesion of the lateral upper right eyelid.  No fluctuance or induration or warmth.  No other redness. PERRLA, EOMI   Assessment: Encounter Diagnosis  Name Primary?   Skin lesion of face Yes     Plan: Discussed findings and recommendations as below.  Skin lesion could represent a stye on the medial side.  Could be other follicular lesion.  Recommendations: Begin daily over the counter astringent cleanser Use warm moist compresses 20 minutes twice daily Begin mupirocin ointment twice daily for 7-10 days If desired you can add small layer of Cortaid hydrocortisone  cream daily at lunch to the 2 separate lesions If not improving in 7-10 days, then  see your eye doctor  Lydia [name] was seen today for acute visit.  Diagnoses and all orders for this visit:  Skin lesion of face  Other orders -     mupirocin ointment (BACTROBAN) 2 %; Apply 1 Application topically 2 (two) times daily.    Follow up: prn

## 2023-11-22 ENCOUNTER — Other Ambulatory Visit: Payer: Self-pay | Admitting: Nurse Practitioner

## 2023-11-22 ENCOUNTER — Telehealth: Admitting: Nurse Practitioner

## 2023-11-22 DIAGNOSIS — H00011 Hordeolum externum right upper eyelid: Secondary | ICD-10-CM | POA: Diagnosis not present

## 2023-11-22 MED ORDER — ERYTHROMYCIN 5 MG/GM OP OINT: 1.0000 | TOPICAL_OINTMENT | Freq: Every day | OPHTHALMIC | 0 refills | Status: DC

## 2023-11-22 NOTE — Progress Notes (Signed)
  E-Visit for Stye   We are sorry that you are not feeling well. Here is how we plan to help!  Based on what you have shared with me it looks like you have a stye.  A stye is an inflammation of the eyelid.  It is often a red, painful lump near the edge of the eyelid that may look like a boil or a pimple.  A stye develops when an infection occurs at the base of an eyelash.   We have made appropriate suggestions for you based upon your presentation: I have prescribed Erythromycin  Ophthalmic ointment 0.5% Apply topically in affected eye daily at bedtime for 5 days. To apply: Tilt the head back and, pressing your finger gently on the skin just beneath the lower eyelid, pull the lower eyelid away from the eye to make a space. Squeeze a thin strip of ointment into this space. A 1-cm (approximately 1/3-inch) strip of ointment is usually enough, unless you have been told by your doctor to use a different amount. Let go of the eyelid and gently close the eyes. Keep the eyes closed for 1 or 2 minutes to allow the medicine to come into contact with the infection.      HOME CARE:  Wash your hands often! Let the stye open on its own. Don't squeeze or open it. Don't rub your eyes. This can irritate your eyes and let in bacteria.  If you need to touch your eyes, wash your hands first. Don't wear eye makeup or contact lenses until the area has healed.  GET HELP RIGHT AWAY IF:  Your symptoms do not improve. You develop blurred or loss of vision. Your symptoms worsen (increased discharge, pain or redness).   Thank you for choosing an e-visit.  Your e-visit answers were reviewed by a board certified advanced clinical practitioner to complete your personal care plan. Depending upon the condition, your plan could have included both over the counter or prescription medications.  Please review your pharmacy choice. Make sure the pharmacy is open so you can pick up prescription now. If there is a problem, you  may contact your provider through [employer] and have the prescription routed to another pharmacy.  Your safety is important to us . If you have drug allergies check your prescription carefully.   For the next 24 hours you can use MyChart to ask questions about today's visit, request a non-urgent call back, or ask for a work or school excuse. You will get an email in the next two days asking about your experience. I hope that your e-visit has been valuable and will speed your recovery.

## 2023-11-22 NOTE — Progress Notes (Signed)
 I have spent 5 minutes in review of e-visit questionnaire, review and updating patient chart, medical decision making and response to patient.   Claiborne Rigg, NP

## 2023-11-26 ENCOUNTER — Ambulatory Visit: Admitting: Orthopedic Surgery

## 2023-12-03 ENCOUNTER — Ambulatory Visit: Admitting: Physical Medicine and Rehabilitation

## 2023-12-03 DIAGNOSIS — M79604 Pain in right leg: Secondary | ICD-10-CM

## 2023-12-03 DIAGNOSIS — M5416 Radiculopathy, lumbar region: Secondary | ICD-10-CM | POA: Diagnosis not present

## 2023-12-03 DIAGNOSIS — R29898 Other symptoms and signs involving the musculoskeletal system: Secondary | ICD-10-CM | POA: Diagnosis not present

## 2023-12-03 DIAGNOSIS — R200 Anesthesia of skin: Secondary | ICD-10-CM

## 2023-12-03 NOTE — Progress Notes (Signed)
 Pain Scale   Average Pain 2 Patient advising she has right foot/ leg numbness, tingling and some weakness. Patient is right hand dominate        +Driver, -BT, -Dye Allergies.

## 2023-12-08 NOTE — Procedures (Unsigned)
 EMG & NCV Findings: Evaluation of the right tibial motor nerve showed reduced amplitude (2.1 mV).  All remaining nerves (as indicated in the following tables) were within normal limits.    All examined muscles (as indicated in the following table) showed no evidence of electrical instability.    Impression: Essentially NORMAL electrodiagnostic study of the right lower limb.  There is no significant electrodiagnostic evidence of nerve entrapment, lumbosacral plexopathy or lumbar radiculopathy.  As you know, purely sensory or demyelinating radiculopathies and chemical radiculitis may not be detected with this particular electrodiagnostic study.  Recommendations: 1.  Follow-up with referring physician. 2.  Continue current management of symptoms.  ___________________________ Prentice Masters FAAPMR Board Certified, American Board of Physical Medicine and Rehabilitation    Nerve Conduction Studies Anti Sensory Summary Table   Stim Site NR Peak (ms) Norm Peak (ms) P-T Amp (V) Norm P-T Amp Site1 Site2 Delta-P (ms) Dist (cm) Vel (m/s) Norm Vel (m/s)  Right Saphenous Anti Sensory (Ant Med Mall)  27.6C  14cm    3.2 <4.4 12.4 >2 14cm Ant Med Mall 3.2 0.0  >32  Right Sup Fibular Anti Sensory (Ant Lat Mall)  27.7C  14 cm    3.2 <4.4 16.4 >5.0 14 cm Ant Lat Mall 3.2 14.0 44 >32  Right Sural Anti Sensory (Lat Mall)  27.5C  Calf    3.6 <4.0 20.4 >5.0 Calf Lat Mall 3.6 14.0 39 >35   Motor Summary Table   Stim Site NR Onset (ms) Norm Onset (ms) O-P Amp (mV) Norm O-P Amp Site1 Site2 Delta-0 (ms) Dist (cm) Vel (m/s) Norm Vel (m/s)  Right Fibular Motor (Ext Dig Brev)  27.8C  Ankle    3.4 <6.1 7.3 >2.5 B Fib Ankle 5.4 28.0 52 >38  B Fib    8.8  6.8  Poplt B Fib 1.5 11.0 73 >40  Poplt    10.3  6.7         Right Tibial Motor (Abd Hall Brev)  27.9C  Ankle    4.9 <6.1 *2.1 >3.0 Knee Ankle 6.5 36.0 55 >35  Knee    11.4  12.2          EMG   Side Muscle Nerve Root Ins Act Fibs Psw Amp Dur Poly Recrt  Int Bruna Comment  Right AntTibialis Dp Br Peron L4-5 Nml Nml Nml Nml Nml 0 Nml Nml   Right Fibularis Longus  Sup Br Peron L5-S1 Nml Nml Nml Nml Nml 0 Nml Nml   Right MedGastroc Tibial S1-2 Nml Nml Nml Nml Nml 0 Nml Nml   Right VastusMed Femoral L2-4 Nml Nml Nml Nml Nml 0 Nml Nml   Right BicepsFemS Sciatic L5-S1 Nml Nml Nml Nml Nml 0 Nml Nml     Nerve Conduction Studies Anti Sensory Left/Right Comparison   Stim Site L Lat (ms) R Lat (ms) L-R Lat (ms) L Amp (V) R Amp (V) L-R Amp (%) Site1 Site2 L Vel (m/s) R Vel (m/s) L-R Vel (m/s)  Saphenous Anti Sensory (Ant Med Mall)  27.6C  14cm  3.2   12.4  14cm Ant Med Mall     Sup Fibular Anti Sensory (Ant Lat Mall)  27.7C  14 cm  3.2   16.4  14 cm Ant Lat Mall  44   Sural Anti Sensory (Lat Mall)  27.5C  Calf  3.6   20.4  Calf Lat Mall  39    Motor Left/Right Comparison   Stim Site L Lat (ms) R Lat (  ms) L-R Lat (ms) L Amp (mV) R Amp (mV) L-R Amp (%) Site1 Site2 L Vel (m/s) R Vel (m/s) L-R Vel (m/s)  Fibular Motor (Ext Dig Brev)  27.8C  Ankle  3.4   7.3  B Fib Ankle  52   B Fib  8.8   6.8  Poplt B Fib  73   Poplt  10.3   6.7        Tibial Motor (Abd Hall Brev)  27.9C  Ankle  4.9   *2.1  Knee Ankle  55   Knee  11.4   12.2           Waveforms:

## 2023-12-09 ENCOUNTER — Encounter: Payer: Self-pay | Admitting: Physical Medicine and Rehabilitation

## 2023-12-09 NOTE — Progress Notes (Signed)
 Lydia [name] - 38 y.o. female MRN [id number]  Date of birth: [birth year]  Office Visit Note: Visit Date: 12/03/2023 PCP: Oris Camie BRAVO, NP Referred by: Lydia Cordella Hamilton, MD  Subjective: Chief Complaint  Patient presents with   Right Leg - Numbness, Weakness, Pain   Right Foot - Numbness, Weakness, Pain   HPI: Lydia [name] is a 38 y.o. female who comes in today at the request of Dr. JUDITHANN [name] Lydia for evaluation and management of chronic, worsening and severe pain, numbness and tingling in the Right lower extremities.  Patient is Right hand dominant.  She reports many months now of ongoing right leg pain and numbness and weakness.  No real issues with foot drop per se but the leg feels weak with some difficulty ambulating at times.  She gets paresthesias down into the lateral posterior leg and bottom of the foot sometimes the whole foot.  She endorses some back pain.  She denies any symptoms on the left.  She carries a diagnosis on the chart of neuropathy but not sure how that was obtained.  I have seen her in the office before for nerve study of the bilateral upper limbs which was normal in 2022.  She has had MRI of the lumbar spine which is reviewed in detail below but does not show any nerve compression or stenosis.  She has tried and failed medication management and time and therapy.  Her case is complicated by history of chronic musculoskeletal pain.   I spent more than 30 minutes speaking face-to-face with the patient with 50% of the time in counseling and discussing coordination of care.      Review of Systems  Musculoskeletal:  Positive for back pain and joint pain.  Neurological:  Positive for tingling and focal weakness.  All other systems reviewed and are negative.  Otherwise per HPI.  Assessment & Plan: Visit Diagnoses:    ICD-10-CM   1. Lumbar radiculopathy  M54.16 NCV with EMG (electromyography)    2. Right leg pain  M79.604     3. Numbness of right foot  R20.0      4. Right leg weakness  R29.898        Plan: Impression: Clinical history most consistent with radiculitis versus radiculopathy but really underwhelming lumbar spine MRI.  Could have a focal nerve entrapment although rare.  Electrodiagnostic study performed today.  Essentially NORMAL electrodiagnostic study of the right lower limb.  There is no significant electrodiagnostic evidence of nerve entrapment, lumbosacral plexopathy or lumbar radiculopathy.    As you know, purely sensory or demyelinating radiculopathies and chemical radiculitis may not be detected with this particular electrodiagnostic study. **This electrodiagnostic study cannot rule out small fiber polyneuropathy and dysesthesias from central pain syndromes such as stroke or central pain sensitization syndromes such as fibromyalgia.  Myotomal referral pain from trigger points is also not excluded.  Recommendations: 1.  Follow-up with referring physician. 2.  Continue current management of symptoms.  Consider consultation with neurology.   Meds & Orders: No orders of the defined types were placed in this encounter.   Orders Placed This Encounter  Procedures   NCV with EMG (electromyography)    Follow-up: Return for  G. [name] Addie, MD.   Procedures: No procedures performed  EMG & NCV Findings: Evaluation of the right tibial motor nerve showed reduced amplitude (2.1 mV).  All remaining nerves (as indicated in the following tables) were within normal limits.    All examined muscles (  as indicated in the following table) showed no evidence of electrical instability.    Impression: Essentially NORMAL electrodiagnostic study of the right lower limb.  There is no significant electrodiagnostic evidence of nerve entrapment, lumbosacral plexopathy or lumbar radiculopathy.    As you know, purely sensory or demyelinating radiculopathies and chemical radiculitis may not be detected with this particular electrodiagnostic study. **This  electrodiagnostic study cannot rule out small fiber polyneuropathy and dysesthesias from central pain syndromes such as stroke or central pain sensitization syndromes such as fibromyalgia.  Myotomal referral pain from trigger points is also not excluded.  Recommendations: 1.  Follow-up with referring physician. 2.  Continue current management of symptoms.  Consider consultation with neurology.  ___________________________ Prentice Masters FAAPMR Board Certified, American Board of Physical Medicine and Rehabilitation    Nerve Conduction Studies Anti Sensory Summary Table   Stim Site NR Peak (ms) Norm Peak (ms) P-T Amp (V) Norm P-T Amp Site1 Site2 Delta-P (ms) Dist (cm) Vel (m/s) Norm Vel (m/s)  Right Saphenous Anti Sensory (Ant Med Mall)  27.6C  14cm    3.2 <4.4 12.4 >2 14cm Ant Med Mall 3.2 0.0  >32  Right Sup Fibular Anti Sensory (Ant Lat Mall)  27.7C  14 cm    3.2 <4.4 16.4 >5.0 14 cm Ant Lat Mall 3.2 14.0 44 >32  Right Sural Anti Sensory (Lat Mall)  27.5C  Calf    3.6 <4.0 20.4 >5.0 Calf Lat Mall 3.6 14.0 39 >35   Motor Summary Table   Stim Site NR Onset (ms) Norm Onset (ms) O-P Amp (mV) Norm O-P Amp Site1 Site2 Delta-0 (ms) Dist (cm) Vel (m/s) Norm Vel (m/s)  Right Fibular Motor (Ext Dig Brev)  27.8C  Ankle    3.4 <6.1 7.3 >2.5 B Fib Ankle 5.4 28.0 52 >38  B Fib    8.8  6.8  Poplt B Fib 1.5 11.0 73 >40  Poplt    10.3  6.7         Right Tibial Motor (Abd Hall Brev)  27.9C  Ankle    4.9 <6.1 *2.1 >3.0 Knee Ankle 6.5 36.0 55 >35  Knee    11.4  12.2          EMG   Side Muscle Nerve Root Ins Act Fibs Psw Amp Dur Poly Recrt Int Bruna Comment  Right AntTibialis Dp Br Peron L4-5 Nml Nml Nml Nml Nml 0 Nml Nml   Right Fibularis Longus  Sup Br Peron L5-S1 Nml Nml Nml Nml Nml 0 Nml Nml   Right MedGastroc Tibial S1-2 Nml Nml Nml Nml Nml 0 Nml Nml   Right VastusMed Femoral L2-4 Nml Nml Nml Nml Nml 0 Nml Nml   Right BicepsFemS Sciatic L5-S1 Nml Nml Nml Nml Nml 0 Nml Nml     Nerve  Conduction Studies Anti Sensory Left/Right Comparison   Stim Site L Lat (ms) R Lat (ms) L-R Lat (ms) L Amp (V) R Amp (V) L-R Amp (%) Site1 Site2 L Vel (m/s) R Vel (m/s) L-R Vel (m/s)  Saphenous Anti Sensory (Ant Med Mall)  27.6C  14cm  3.2   12.4  14cm Ant Med Mall     Sup Fibular Anti Sensory (Ant Lat Mall)  27.7C  14 cm  3.2   16.4  14 cm Ant Lat Mall  44   Sural Anti Sensory (Lat Mall)  27.5C  Calf  3.6   20.4  Calf Lat Mall  39    Motor Left/Right Comparison  Stim Site L Lat (ms) R Lat (ms) L-R Lat (ms) L Amp (mV) R Amp (mV) L-R Amp (%) Site1 Site2 L Vel (m/s) R Vel (m/s) L-R Vel (m/s)  Fibular Motor (Ext Dig Brev)  27.8C  Ankle  3.4   7.3  B Fib Ankle  52   B Fib  8.8   6.8  Poplt B Fib  73   Poplt  10.3   6.7        Tibial Motor (Abd Hall Brev)  27.9C  Ankle  4.9   *2.1  Knee Ankle  55   Knee  11.4   12.2           Waveforms:            Clinical History: 02/09/2021 EMG/NCS  EMG & NCV Findings: All nerve conduction studies (as indicated in the following tables) were within normal limits.  All left vs. right side differences were within normal limits.     All examined muscles (as indicated in the following table) showed no evidence of electrical instability.     Impression: Essentially NORMAL electrodiagnostic study of both upper limbs.  There is no significant electrodiagnostic evidence of nerve entrapment, brachial plexopathy or cervical radiculopathy.     As you know, purely sensory or demyelinating radiculopathies and chemical radiculitis may not be detected with this particular electrodiagnostic study.   Recommendations: 1.  Follow-up with referring physician. 2.  Continue current management of symptoms.   ___________________________ Prentice Masters FAAPMR   MRI LUMBAR SPINE WITHOUT CONTRAST   TECHNIQUE: Multiplanar, multisequence MR imaging of the lumbar spine was performed. No intravenous contrast was administered.   COMPARISON:  Lumbar spine  radiographs 05/27/2022.   FINDINGS: Segmentation: Conventional numbering is assumed with 5 non-rib-bearing, lumbar type vertebral bodies.   Alignment:  Normal.   Vertebrae:  Normal vertebral body heights and marrow signal.   Conus medullaris and cauda equina: Conus extends to the L1 level. Conus and cauda equina appear normal.   Paraspinal and other soft tissues: Unremarkable.   Disc levels:   T12-L1:  Normal.   L1-L2:  Normal.   L2-L3:  Normal.   L3-L4:  Normal.   L4-L5:  Normal.   L5-S1: Small disc bulge and mild bilateral facet arthropathy without spinal canal stenosis or significant neural foraminal narrowing.   IMPRESSION: Small disc bulge and mild bilateral facet arthropathy at L5-S1 without spinal canal stenosis or significant neural foraminal narrowing.     Electronically Signed   By: Ryan Chess M.D.   On: 08/05/2022 14:52   She reports that she has never smoked. She has never used smokeless tobacco.  Recent Labs    02/05/23 1108  HGBA1C 5.5    Objective:  VS:  HT:    WT:   BMI:     BP:   HR: bpm  TEMP: ( )  RESP:  Physical Exam Vitals and nursing note reviewed.  Constitutional:      General: She is not in acute distress.    Appearance: Normal appearance. She is well-developed. She is not ill-appearing.  HENT:     Head: Normocephalic and atraumatic.  Eyes:     Conjunctiva/sclera: Conjunctivae normal.     Pupils: Pupils are equal, round, and reactive to light.  Cardiovascular:     Rate and Rhythm: Normal rate.     Pulses: Normal pulses.  Pulmonary:     Effort: Pulmonary effort is normal.  Musculoskeletal:  General: Tenderness present.     Right lower leg: No edema.     Left lower leg: No edema.     Comments: Examination of both lower limbs shows normal muscle bulk and tone without spasticity and with only minimal atrophy of the intrinsic foot muscles which can be normal with age.  There is no swelling or color change or  allodynia.  She has 5 out of 5 strength with dorsiflexion plantarflexion EHL as well as knee extension flexion although she is somewhat apprehensive on the right.  Negative Tinel's over the fibular heads bilaterally.  Skin:    General: Skin is warm and dry.     Findings: No erythema or rash.  Neurological:     General: No focal deficit present.     Mental Status: She is alert and oriented to person, place, and time.     Cranial Nerves: No cranial nerve deficit.     Sensory: No sensory deficit.     Motor: No weakness or abnormal muscle tone.     Coordination: Coordination normal.     Gait: Gait normal.  Psychiatric:        Mood and Affect: Mood normal.        Behavior: Behavior normal.     Ortho Exam  Imaging: No results found.  Past Medical/Family/Surgical/Social History: Medications & Allergies reviewed per EMR, new medications updated. Patient Active Problem List   Diagnosis Date Noted   Pre-syncope 10/21/2023   Neuropathy 10/21/2023   Itching 10/21/2023   Chronic bilateral low back pain without sciatica 07/02/2023   Chronic pain of both knees 07/02/2023   Pain in both wrists 07/02/2023   Arthritis 07/02/2023   Subcutaneous nodule of left forearm 06/09/2023   Gastroesophageal reflux disease 02/05/2023   Dizziness 07/15/2022   Reflux pharyngitis 04/02/2022   Chronic cystitis 12/17/2021   Somatic dysfunction of spine, lumbar 09/28/2021   Insomnia 07/09/2021   Panic attack 06/06/2021   Paresthesias 06/06/2021   Generalized anxiety disorder 04/20/2021   Chronic daily headache 04/20/2021   Chronic idiopathic constipation 03/29/2021   Family history of congenital anomalies    Past Medical History:  Diagnosis Date   Abdominal pain, epigastric 01/26/2014   Acute recurrent maxillary sinusitis 03/11/2014   Allergy    Anxiety    Chronic constipation    [employer]  helps    Chronic headaches    none x 2-3 yrs   Dyspareunia, female 03/11/2014   GERD (gastroesophageal  reflux disease)    HEADACHE 11/30/2007   Qualifier: Diagnosis of  By: Joannie MD, Rosy Hunt    Infection    UTI   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/22/2020   Memory loss 11/30/2007   Qualifier: Diagnosis of   By: Joannie MD, Yashashwi         Meningitis    Ovarian cyst    Pain 12/08/2013   Dyspareunia    Pelvic pain in female 07/06/2013   Right-sided back pain 09/28/2021   Strep throat 01/23/2022   Tinea versicolor 12/08/2013   Viral pharyngitis 04/02/2022   Family History  Problem Relation Age of Onset   Migraines Mother    Hypertension Mother    Asthma Father    Birth defects Daughter        Limb reduction defect   Breast cancer Maternal Aunt 42   Colon cancer Neg Hx    Esophageal cancer Neg Hx    Rectal cancer Neg Hx    Stomach cancer Neg Hx  Colon polyps Neg Hx    Past Surgical History:  Procedure Laterality Date   CHOLECYSTECTOMY  2016   COLONOSCOPY     LAPAROSCOPIC APPENDECTOMY N/A 11/10/2020   Procedure: LAPAROSCOPIC APPENDECTOMY;  Surgeon: Kinsinger, Herlene Righter, MD;  Location: [facility];  Service: General;  Laterality: N/A;   UPPER GASTROINTESTINAL ENDOSCOPY  2015   WISDOM TOOTH EXTRACTION     Social History   Occupational History   Occupation: Stay at home mom  Tobacco Use   Smoking status: Never   Smokeless tobacco: Never  Vaping Use   Vaping status: Never Used  Substance and Sexual Activity   Alcohol use: Not Currently    Comment: occ   Drug use: No   Sexual activity: Yes    Partners: Male    Birth control/protection: Pill, OCP

## 2023-12-16 ENCOUNTER — Ambulatory Visit: Admitting: Nurse Practitioner

## 2023-12-17 ENCOUNTER — Ambulatory Visit: Admitting: Orthopedic Surgery

## 2023-12-20 ENCOUNTER — Telehealth: Admitting: Nurse Practitioner

## 2023-12-20 DIAGNOSIS — R109 Unspecified abdominal pain: Secondary | ICD-10-CM

## 2023-12-20 MED ORDER — CYCLOBENZAPRINE HCL 5 MG PO TABS: 5.0000 mg | ORAL_TABLET | Freq: Three times a day (TID) | ORAL | 1 refills | Status: DC | PRN

## 2023-12-20 NOTE — Patient Instructions (Signed)
 Lydia [name], thank you for joining Lydia LELON Servant, NP for today's virtual visit.  While this provider is not your primary care provider (PCP), if your PCP is located in our provider database this encounter information will be shared with them immediately following your visit.   A [facility] MyChart account gives you access to today's visit and all your visits, tests, and labs performed at [facility]  click here if you don't have a [facility] MyChart account or go to [email or web address]  Consent: (Patient) Lydia [name] provided verbal consent for this virtual visit at the beginning of the encounter.  Current Medications:  Current Outpatient Medications:    cyclobenzaprine  (FLEXERIL ) 5 MG tablet, Take 1 tablet (5 mg total) by mouth 3 (three) times daily as needed for muscle spasms., Disp: 30 tablet, Rfl: 1   atorvastatin  (LIPITOR) 20 MG tablet, TAKE 1 TABLET(20 MG) BY MOUTH DAILY, Disp: 90 tablet, Rfl: 1   celecoxib  (CELEBREX ) 100 MG capsule, Take 1 capsule (100 mg total) by mouth 2 (two) times daily., Disp: 60 capsule, Rfl: 3   cetirizine  (ZYRTEC  ALLERGY) 10 MG tablet, Take 1 tablet (10 mg total) by mouth daily., Disp: 90 tablet, Rfl: 1   Continuous Glucose Sensor (FREESTYLE LIBRE 3 PLUS SENSOR) MISC, Change sensor every 15 days., Disp: 2 each, Rfl: 2   erythromycin  ophthalmic ointment, Place 1 Application into the right eye at bedtime., Disp: 3.5 g, Rfl: 0   Fremanezumab -vfrm (AJOVY ) 225 MG/1.5ML SOAJ, Inject 1 Pen into the skin every 30 (thirty) days., Disp: 1.68 mL, Rfl: 11   hydrOXYzine  (VISTARIL ) 50 MG capsule, Take 1 capsule (50 mg total) by mouth at bedtime and may repeat dose one time if needed. For sleep and anxiety., Disp: 60 capsule, Rfl: 6   ibuprofen  (ADVIL ) 800 MG tablet, Take 1 tablet (800 mg total) by mouth every 8 (eight) hours as needed., Disp: 30 tablet, Rfl: 5   JUNEL  FE 1.5/30 1.5-30 MG-MCG tablet, Take 1 tablet by mouth daily., Disp: 28 tablet, Rfl:  12   meclizine  (ANTIVERT ) 12.5 MG tablet, Take 1 tablet (12.5 mg total) by mouth 3 (three) times daily as needed for dizziness., Disp: 30 tablet, Rfl: 0   mupirocin  ointment (BACTROBAN ) 2 %, Apply 1 Application topically 2 (two) times daily., Disp: 22 g, Rfl: 0   ondansetron  (ZOFRAN -ODT) 4 MG disintegrating tablet, Take 1 tablet (4 mg total) by mouth every 4-6 hours as needed for nausea or vomiting., Disp: 15 tablet, Rfl: 1   Prucalopride Succinate  (MOTEGRITY ) 2 MG TABS, Take 1 tablet (2 mg total) by mouth daily., Disp: 30 tablet, Rfl: 5   Rimegepant Sulfate (NURTEC) 75 MG TBDP, Take 1 tablet (75 mg total) by mouth every other day. Take one pill at onset of migraine. Max dose 1 pill in 24 hours, Disp: 16 tablet, Rfl: 11   Medications ordered in this encounter:  Meds ordered this encounter  Medications   cyclobenzaprine  (FLEXERIL ) 5 MG tablet    Sig: Take 1 tablet (5 mg total) by mouth 3 (three) times daily as needed for muscle spasms.    Dispense:  30 tablet    Refill:  1    Supervising Provider:   LAMPTEY, PHILIP O [phone]     *If you need refills on other medications prior to your next appointment, please contact your pharmacy*  Follow-Up: Call back or seek an in-person evaluation if the symptoms worsen or if the condition fails to improve as anticipated.  [facility] [phone]  Other Instructions Continue ibuprofen  Heat and cold therapy    If you have been instructed to have an in-person evaluation today at a local Urgent Care facility, please use the link below. It will take you to a list of all of our available [place] [facility], including address, phone number and hours of operation. Please do not delay care.  [facility]  If you or a family member do not have a primary care provider, use the link below to schedule a visit and establish care. When you choose a [facility] primary care physician or advanced practice provider, you gain a  long-term partner in health. Find a Primary Care Provider  Learn more about [facility] [place]'s in-office and virtual care options: [place] - Get Care Now

## 2023-12-20 NOTE — Progress Notes (Signed)
 Virtual Visit Consent   Lydia [name], you are scheduled for a virtual visit with a  provider today. Just as with appointments in the office, your consent must be obtained to participate. Your consent will be active for this visit and any virtual visit you may have with one of our providers in the next 365 days. If you have a MyChart account, a copy of this consent can be sent to you electronically.  As this is a virtual visit, video technology does not allow for your provider to perform a traditional examination. This may limit your provider's ability to fully assess your condition. If your provider identifies any concerns that need to be evaluated in person or the need to arrange testing (such as labs, EKG, etc.), we will make arrangements to do so. Although advances in technology are sophisticated, we cannot ensure that it will always work on either your end or our end. If the connection with a video visit is poor, the visit may have to be switched to a telephone visit. With either a video or telephone visit, we are not always able to ensure that we have a secure connection.  By engaging in this virtual visit, you consent to the provision of healthcare and authorize for your insurance to be billed (if applicable) for the services provided during this visit. Depending on your insurance coverage, you may receive a charge related to this service.  I need to obtain your verbal consent now. Are you willing to proceed with your visit today? Lydia [name] has provided verbal consent on 12/20/2023 for a virtual visit (video or telephone). Lydia LELON Servant, NP  Date: 12/20/2023 12:10 PM   Virtual Visit via Video Note   I, Lydia [name], connected with  Lydia [name]  ([id number], [birth year]) on 12/20/23 at 12:00 PM EDT by a video-enabled telemedicine application and verified that I am speaking with the correct person using two identifiers.  Location: Patient: Virtual Visit Location Patient:  Home Provider: Virtual Visit Location Provider: Home Office   I discussed the limitations of evaluation and management by telemedicine and the availability of in person appointments. The patient expressed understanding and agreed to proceed.    History of Present Illness: Lydia [name] is a 38 y.o. who identifies as a female who was assigned female at birth, and is being seen today for left sided abdominal pain.  Lydia [name] has recently started exercising and using an ab machine. 3 days ago she started to feel pain on the left side of her abdomen. It is painful to lay on that side as well. Aggravating factors: pulling up or reaching, stretching. She has been taking ibuprofen  for her symptoms. Denies fever, vomiting, flank pain, dysuria.   Problems:  Patient Active Problem List   Diagnosis Date Noted   Pre-syncope 10/21/2023   Neuropathy 10/21/2023   Itching 10/21/2023   Chronic bilateral low back pain without sciatica 07/02/2023   Chronic pain of both knees 07/02/2023   Pain in both wrists 07/02/2023   Arthritis 07/02/2023   Subcutaneous nodule of left forearm 06/09/2023   Gastroesophageal reflux disease 02/05/2023   Dizziness 07/15/2022   Reflux pharyngitis 04/02/2022   Chronic cystitis 12/17/2021   Somatic dysfunction of spine, lumbar 09/28/2021   Insomnia 07/09/2021   Panic attack 06/06/2021   Paresthesias 06/06/2021   Generalized anxiety disorder 04/20/2021   Chronic daily headache 04/20/2021   Chronic idiopathic constipation 03/29/2021   Family history of congenital anomalies  Allergies:  Allergies  Allergen Reactions   Penicillins Hives, Itching and Rash    Has patient had a PCN reaction causing immediate rash, facial/tongue/throat swelling, SOB or lightheadedness with hypotension: Yes Has patient had a PCN reaction causing severe rash involving mucus membranes or skin necrosis: Yes Has patient had a PCN reaction that required hospitalization no Has patient had a PCN  reaction occurring within the last 10 years: No If all of the above answers are NO, then may proceed with Cephalosporin use. Has patient had a PCN reaction causing immediate rash, facial/tongue/throat swelling, SOB or lightheadedness with hypotension: Yes Has patient had a PCN reaction causing severe rash involving mucus membranes or skin necrosis: Yes Has patient had a PCN reaction that required hospitalization no Has patient had a PCN reaction occurring within the last 10 years: No If all of the above answers are NO, then may proceed with Cephalosporin use.   Medications:  Current Outpatient Medications:    cyclobenzaprine  (FLEXERIL ) 5 MG tablet, Take 1 tablet (5 mg total) by mouth 3 (three) times daily as needed for muscle spasms., Disp: 30 tablet, Rfl: 1   atorvastatin  (LIPITOR) 20 MG tablet, TAKE 1 TABLET(20 MG) BY MOUTH DAILY, Disp: 90 tablet, Rfl: 1   celecoxib  (CELEBREX ) 100 MG capsule, Take 1 capsule (100 mg total) by mouth 2 (two) times daily., Disp: 60 capsule, Rfl: 3   cetirizine  (ZYRTEC  ALLERGY) 10 MG tablet, Take 1 tablet (10 mg total) by mouth daily., Disp: 90 tablet, Rfl: 1   Continuous Glucose Sensor (FREESTYLE LIBRE 3 PLUS SENSOR) MISC, Change sensor every 15 days., Disp: 2 each, Rfl: 2   erythromycin  ophthalmic ointment, Place 1 Application into the right eye at bedtime., Disp: 3.5 g, Rfl: 0   Fremanezumab -vfrm (AJOVY ) 225 MG/1.5ML SOAJ, Inject 1 Pen into the skin every 30 (thirty) days., Disp: 1.68 mL, Rfl: 11   hydrOXYzine  (VISTARIL ) 50 MG capsule, Take 1 capsule (50 mg total) by mouth at bedtime and may repeat dose one time if needed. For sleep and anxiety., Disp: 60 capsule, Rfl: 6   ibuprofen  (ADVIL ) 800 MG tablet, Take 1 tablet (800 mg total) by mouth every 8 (eight) hours as needed., Disp: 30 tablet, Rfl: 5   JUNEL  FE 1.5/30 1.5-30 MG-MCG tablet, Take 1 tablet by mouth daily., Disp: 28 tablet, Rfl: 12   meclizine  (ANTIVERT ) 12.5 MG tablet, Take 1 tablet (12.5 mg  total) by mouth 3 (three) times daily as needed for dizziness., Disp: 30 tablet, Rfl: 0   mupirocin  ointment (BACTROBAN ) 2 %, Apply 1 Application topically 2 (two) times daily., Disp: 22 g, Rfl: 0   ondansetron  (ZOFRAN -ODT) 4 MG disintegrating tablet, Take 1 tablet (4 mg total) by mouth every 4-6 hours as needed for nausea or vomiting., Disp: 15 tablet, Rfl: 1   Prucalopride Succinate  (MOTEGRITY ) 2 MG TABS, Take 1 tablet (2 mg total) by mouth daily., Disp: 30 tablet, Rfl: 5   Rimegepant Sulfate (NURTEC) 75 MG TBDP, Take 1 tablet (75 mg total) by mouth every other day. Take one pill at onset of migraine. Max dose 1 pill in 24 hours, Disp: 16 tablet, Rfl: 11  Observations/Objective: Patient is well-developed, well-nourished in no acute distress.  Resting comfortably at home.  Head is normocephalic, atraumatic.  No labored breathing.  Speech is clear and coherent with logical content.  Patient is alert and oriented at baseline.    Assessment and Plan: 1. Left sided abdominal pain (Primary) - cyclobenzaprine  (FLEXERIL ) 5 MG tablet; Take 1  tablet (5 mg total) by mouth 3 (three) times daily as needed for muscle spasms.  Dispense: 30 tablet; Refill: 1  Continue ibuprofen  Heat and cold therapy   Follow Up Instructions: I discussed the assessment and treatment plan with the patient. The patient was provided an opportunity to ask questions and all were answered. The patient agreed with the plan and demonstrated an understanding of the instructions.  A copy of instructions were sent to the patient via MyChart unless otherwise noted below.    The patient was advised to call back or seek an in-person evaluation if the symptoms worsen or if the condition fails to improve as anticipated.    Jacquese Hackman W Freada Twersky, NP

## 2023-12-31 ENCOUNTER — Ambulatory Visit: Admitting: Orthopedic Surgery

## 2024-01-01 ENCOUNTER — Ambulatory Visit: Admitting: Nurse Practitioner

## 2024-01-14 ENCOUNTER — Ambulatory Visit: Admitting: Nurse Practitioner

## 2024-02-09 NOTE — Progress Notes (Unsigned)
 Flu: Covid: HPV:   Lydia Doing, DNP, AGNP-c [facility] [address] [place], [place] [address] Main Office [phone] VISIT TYPE: CPE on 02/10/2024 Today's Vitals   02/10/24 0944  BP: 122/80  Pulse: 71  Weight: 171 lb (77.6 kg)  Height: 5' (1.524 m)   Body mass index is 33.4 kg/m. BP 122/80   Pulse 71   Ht 5' (1.524 m)   Wt 171 lb (77.6 kg)   BMI 33.40 kg/m   Subjective:    Patient ID: Lydia Lydia [name] Lydia Lydia [name], female    DOB: 08-25-1985, 38 y.o.   MRN: [id number]  HPI: History of Present Illness Lydia Lydia [name] is a 38 year old female who presents for an annual physical exam.  She finds Ajovy  effective for managing her migraines. She uses meclizine  as needed for sporadic episodes of vertigo. Hydroxyzine  is part of her current medication regimen.  She has not been taking her cholesterol medication as prescribed; she reports that when she was taking it, she stopped feeling heaviness in her feet, but since stopping, she started feeling it again. She describes her activity level as generally active, though it does not involve structured exercise.  She has received three COVID-19 vaccinations. Motegrity  is effective for her, although there was a delay in receiving the medication due to a change in the brand, which caused some issues until she resumed taking it. She uses Zyrtec  occasionally for allergy symptoms.  No recent episodes of presyncope, changes in skin, hearing, or vision, fullness in her throat, shortness of breath, chest pain, palpitations, or changes in bowel or bladder habits. Her menstrual periods are regular without heavy bleeding.  Pertinent items are noted in HPI.  Most Recent Depression Screen:     02/10/2024    9:44 AM 09/02/2023    3:31 PM 02/05/2023   10:34 AM 04/02/2022   11:07 AM 01/23/2022    8:15 AM  Depression screen PHQ 2/9  Decreased Interest 0 1 0 0 0  Down, Depressed, Hopeless 0 0 0 0 0  PHQ - 2 Score 0 1 0 0 0  Altered sleeping  1   0 1  Tired, decreased energy  2  0 1  Change in appetite  2  0 0  Feeling bad or failure about yourself   0   0  Trouble concentrating  0  0 0  Moving slowly or fidgety/restless  0  0 0  Suicidal thoughts  0  0 0  PHQ-9 Score  6  0 2  Difficult Lydia [name]    Not difficult at all Not difficult at all   Most Recent Anxiety Screen:     09/02/2023    3:31 PM 04/02/2022   11:07 AM 09/12/2021    9:58 AM 05/22/2021   10:47 AM  GAD 7 : Generalized Anxiety Score  Nervous, Anxious, on Edge 1 0 0 2  Control/stop worrying 0 0 0 0  Worry too much - different things 0 0 2 1  Trouble relaxing 0 0 1 0  Restless 0 0 0 0  Easily annoyed or irritable 1 0 0 2  Afraid - awful might happen 0 0 0 0  Total GAD 7 Score 2 0 3 5  Anxiety Difficulty  Not difficult at all  Somewhat difficult   Most Recent Fall Screen:    02/10/2024    9:44 AM 02/05/2023   10:34 AM 04/02/2022   11:06 AM 01/23/2022    8:15 AM 08/20/2021  3:01 PM  Fall Risk   Falls in the past year? 0 0 0 0 0  Number falls in past yr: 0 0 0 0 0  Injury with Fall? 0 0 0 0 0  Risk for fall due to : No Fall Risks No Fall Risks No Fall Risks No Fall Risks No Fall Risks  Follow up Falls evaluation completed Falls evaluation completed Falls evaluation completed  Falls evaluation completed;Education provided  Falls evaluation completed;Education provided      Data saved with a previous flowsheet row definition    Past medical history, surgical history, medications, allergies, family history and social history reviewed with patient today and changes made to appropriate areas of the chart.  Past Medical History:  Past Medical History:  Diagnosis Date   Abdominal pain, epigastric 01/26/2014   Acute recurrent maxillary sinusitis 03/11/2014   Allergy    Anxiety    Chronic constipation    Motegrity  helps    Chronic headaches    none x 2-3 yrs   Dyspareunia, female 03/11/2014   GERD (gastroesophageal reflux disease)    HEADACHE  11/30/2007   Qualifier: Diagnosis of  By: Joannie MD, Rosy Hunt    Infection    UTI   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/22/2020   Memory loss 11/30/2007   Qualifier: Diagnosis of   By: Joannie MD, Yashashwi         Meningitis    Ovarian cyst    Pain 12/08/2013   Dyspareunia    Pain in both wrists 07/02/2023   Paresthesias 06/06/2021   Pelvic pain in female 07/06/2013   Pre-syncope 10/21/2023   Reflux pharyngitis 04/02/2022   Right-sided back pain 09/28/2021   Somatic dysfunction of spine, lumbar 09/28/2021   Strep throat 01/23/2022   Subcutaneous nodule of left forearm 06/09/2023   Tinea versicolor 12/08/2013   Viral pharyngitis 04/02/2022   Medications:  Current Outpatient Medications on File Prior to Visit  Medication Sig   atorvastatin  (LIPITOR) 20 MG tablet TAKE 1 TABLET(20 MG) BY MOUTH DAILY   celecoxib  (CELEBREX ) 100 MG capsule Take 1 capsule (100 mg total) by mouth 2 (two) times daily.   cyclobenzaprine  (FLEXERIL ) 5 MG tablet Take 1 tablet (5 mg total) by mouth 3 (three) times daily as needed for muscle spasms.   erythromycin  ophthalmic ointment Place 1 Application into the right eye at bedtime.   Fremanezumab -vfrm (AJOVY ) 225 MG/1.5ML SOAJ Inject 1 Pen into the skin every 30 (thirty) days.   ibuprofen  (ADVIL ) 800 MG tablet Take 1 tablet (800 mg total) by mouth every 8 (eight) hours as needed.   JUNEL  FE 1.5/30 1.5-30 MG-MCG tablet Take 1 tablet by mouth daily.   ondansetron  (ZOFRAN -ODT) 4 MG disintegrating tablet Take 1 tablet (4 mg total) by mouth every 4-6 hours as needed for nausea or vomiting.   Prucalopride Succinate  (MOTEGRITY ) 2 MG TABS Take 1 tablet (2 mg total) by mouth daily.   Rimegepant Sulfate (NURTEC) 75 MG TBDP Take 1 tablet (75 mg total) by mouth every other day. Take one pill at onset of migraine. Max dose 1 pill in 24 hours   No current facility-administered medications on file prior to visit.   Surgical History:  Past Surgical History:  Procedure  Laterality Date   CHOLECYSTECTOMY  2016   COLONOSCOPY     LAPAROSCOPIC APPENDECTOMY N/A 11/10/2020   Procedure: LAPAROSCOPIC APPENDECTOMY;  Surgeon: Stevie Herlene Righter, MD;  Location: [facility];  Service: General;  Laterality: N/A;  UPPER GASTROINTESTINAL ENDOSCOPY  2015   WISDOM TOOTH EXTRACTION     Allergies:  Allergies  Allergen Reactions   Penicillins Hives, Itching and Rash    Has patient had a PCN reaction causing immediate rash, facial/tongue/throat swelling, SOB or lightheadedness with hypotension: Yes Has patient had a PCN reaction causing severe rash involving mucus membranes or skin necrosis: Yes Has patient had a PCN reaction that required hospitalization no Has patient had a PCN reaction occurring within the last 10 years: No If all of the above answers are NO, then may proceed with Cephalosporin use. Has patient had a PCN reaction causing immediate rash, facial/tongue/throat swelling, SOB or lightheadedness with hypotension: Yes Has patient had a PCN reaction causing severe rash involving mucus membranes or skin necrosis: Yes Has patient had a PCN reaction that required hospitalization no Has patient had a PCN reaction occurring within the last 10 years: No If all of the above answers are NO, then may proceed with Cephalosporin use.   Family History:  Family History  Problem Relation Age of Onset   Migraines Mother    Hypertension Mother    Asthma Father    Birth defects Daughter        Limb reduction defect   Breast cancer Maternal Aunt 10   Colon cancer Neg Hx    Esophageal cancer Neg Hx    Rectal cancer Neg Hx    Stomach cancer Neg Hx    Colon polyps Neg Hx        Objective:    BP 122/80   Pulse 71   Ht 5' (1.524 m)   Wt 171 lb (77.6 kg)   BMI 33.40 kg/m   Wt Readings from Last 3 Encounters:  02/10/24 171 lb (77.6 kg)  11/13/23 168 lb 9.6 oz (76.5 kg)  10/21/23 169 lb 12.8 oz (77 kg)    Physical Exam Vitals and nursing note reviewed.   Constitutional:      General: She is not in acute distress.    Appearance: Normal appearance.  HENT:     Head: Normocephalic and atraumatic.     Right Ear: Hearing, tympanic membrane, ear canal and external ear normal.     Left Ear: Hearing, tympanic membrane, ear canal and external ear normal.     Nose: Nose normal.     Right Sinus: No maxillary sinus tenderness or frontal sinus tenderness.     Left Sinus: No maxillary sinus tenderness or frontal sinus tenderness.     Mouth/Throat:     Lips: Pink.     Mouth: Mucous membranes are moist.     Pharynx: Oropharynx is clear.  Eyes:     General: Lids are normal. Vision grossly intact.     Extraocular Movements: Extraocular movements intact.     Conjunctiva/sclera: Conjunctivae normal.     Pupils: Pupils are equal, round, and reactive to light.     Funduscopic exam:    Right eye: Red reflex present.        Left eye: Red reflex present.    Visual Fields: Right eye visual fields normal and left eye visual fields normal.  Neck:     Thyroid : No thyromegaly.     Vascular: No carotid bruit.  Cardiovascular:     Rate and Rhythm: Normal rate and regular rhythm.     Chest Wall: PMI is not displaced.     Pulses: Normal pulses.          Dorsalis pedis pulses are 2+ on  the right side and 2+ on the left side.       Posterior tibial pulses are 2+ on the right side and 2+ on the left side.     Heart sounds: Normal heart sounds. No murmur heard. Pulmonary:     Effort: Pulmonary effort is normal. No respiratory distress.     Breath sounds: Normal breath sounds.  Abdominal:     General: Abdomen is flat. Bowel sounds are normal. There is no distension.     Palpations: Abdomen is soft. There is no hepatomegaly, splenomegaly or mass.     Tenderness: There is no abdominal tenderness. There is no right CVA tenderness, left CVA tenderness, guarding or rebound.  Musculoskeletal:        General: Normal range of motion.     Cervical back: Full passive  range of motion without pain, normal range of motion and neck supple. No tenderness.     Right lower leg: No edema.     Left lower leg: No edema.  Feet:     Left foot:     Toenail Condition: Left toenails are normal.  Lymphadenopathy:     Cervical: No cervical adenopathy.     Upper Body:     Right upper body: No supraclavicular adenopathy.     Left upper body: No supraclavicular adenopathy.  Skin:    General: Skin is warm and dry.     Capillary Refill: Capillary refill takes less than 2 seconds.     Nails: There is no clubbing.  Neurological:     General: No focal deficit present.     Mental Status: She is alert and oriented to person, place, and time.     GCS: GCS eye subscore is 4. GCS verbal subscore is 5. GCS motor subscore is 6.     Sensory: Sensation is intact.     Motor: Motor function is intact.     Coordination: Coordination is intact.     Gait: Gait is intact.     Deep Tendon Reflexes: Reflexes are normal and symmetric.  Psychiatric:        Attention and Perception: Attention normal.        Mood and Affect: Mood normal.        Speech: Speech normal.        Behavior: Behavior normal. Behavior is cooperative.        Thought Content: Thought content normal.        Cognition and Memory: Cognition and memory normal.        Judgment: Judgment normal.      Results for orders placed or performed in visit on 10/21/23  CBC with Differential/Platelet   Collection Time: 10/21/23  3:24 PM  Result Value Ref Range   WBC 10.1 3.4 - 10.8 x10E3/uL   RBC 4.36 3.77 - 5.28 x10E6/uL   Hemoglobin 12.9 11.1 - 15.9 g/dL   Hematocrit 60.6 65.9 - 46.6 %   MCV 90 79 - 97 fL   MCH 29.6 26.6 - 33.0 pg   MCHC 32.8 31.5 - 35.7 g/dL   RDW 88.1 88.2 - 84.5 %   Platelets 426 150 - 450 x10E3/uL   Neutrophils 57 Not Estab. %   Lymphs 38 Not Estab. %   Monocytes 4 Not Estab. %   Eos 1 Not Estab. %   Basos 0 Not Estab. %   Neutrophils Absolute 5.7 1.4 - 7.0 x10E3/uL   Lymphocytes Absolute  3.9 (H) 0.7 - 3.1 x10E3/uL   Monocytes  Absolute 0.4 0.1 - 0.9 x10E3/uL   EOS (ABSOLUTE) 0.1 0.0 - 0.4 x10E3/uL   Basophils Absolute 0.0 0.0 - 0.2 x10E3/uL   Immature Granulocytes 0 Not Estab. %   Immature Grans (Abs) 0.0 0.0 - 0.1 x10E3/uL  Comprehensive metabolic panel with GFR   Collection Time: 10/21/23  3:24 PM  Result Value Ref Range   Glucose 96 70 - 99 mg/dL   BUN 9 6 - 20 mg/dL   Creatinine, Ser 9.35 0.57 - 1.00 mg/dL   eGFR 882 >40 fO/fpw/8.26   BUN/Creatinine Ratio 14 9 - 23   Sodium 136 134 - 144 mmol/L   Potassium 4.3 3.5 - 5.2 mmol/L   Chloride 102 96 - 106 mmol/L   CO2 20 20 - 29 mmol/L   Calcium  9.7 8.7 - 10.2 mg/dL   Total Protein 6.4 6.0 - 8.5 g/dL   Albumin 4.2 3.9 - 4.9 g/dL   Globulin, Total 2.2 1.5 - 4.5 g/dL   Bilirubin Total 0.4 0.0 - 1.2 mg/dL   Alkaline Phosphatase 53 44 - 121 IU/L   AST 13 0 - 40 IU/L   ALT 8 0 - 32 IU/L  Iron, TIBC and Ferritin Panel   Collection Time: 10/21/23  3:24 PM  Result Value Ref Range   Total Iron Binding Capacity 404 250 - 450 ug/dL   UIBC 695 868 - 574 ug/dL   Iron 899 27 - 840 ug/dL   Iron Saturation 25 15 - 55 %   Ferritin 88 15 - 150 ng/mL  TSH   Collection Time: 10/21/23  3:24 PM  Result Value Ref Range   TSH 1.780 0.450 - 4.500 uIU/mL  Vitamin B12   Collection Time: 10/21/23  3:24 PM  Result Value Ref Range   Vitamin B-12 538 232 - 1,245 pg/mL  VITAMIN D  25 Hydroxy (Vit-D Deficiency, Fractures)   Collection Time: 10/21/23  3:24 PM  Result Value Ref Range   Vit D, 25-Hydroxy 42.3 30.0 - 100.0 ng/mL       Assessment & Plan:   Problem List Items Addressed This Visit     Encounter for annual physical exam - Primary   CPE completed today. Review of HM activities and recommendations discussed and provided on AVS. Anticipatory guidance, diet, and exercise recommendations provided. Medications, allergies, and hx reviewed and updated as necessary. Orders placed as listed below.  Plan: - Labs ordered. Will make  changes as necessary based on results.  - I will review these results and send recommendations via MyChart or a telephone call.  - F/U with CPE in 1 year or sooner for acute/chronic health needs as directed.        Relevant Orders   CBC with Differential/Platelet   CMP14+EGFR   Chronic idiopathic constipation   Constipation managed with Motegrity . She reports issues when there was a delay in receiving medication, but symptoms resolved once medication was resumed.       Generalized anxiety disorder   No concerns present at this time. Hydroxyzine  PRN for flairs and symptomatic hives.       Relevant Medications   hydrOXYzine  (VISTARIL ) 50 MG capsule   Insomnia   Improved symptoms with PRN hydroxyzine  and control of panic symptoms. No alarm symptoms present today. Continue current treatment and monitor for new or worsening symptoms and report immediately. F/U in 6 months or sooner if needed.       Relevant Medications   hydrOXYzine  (VISTARIL ) 50 MG capsule   Seasonal allergies  Allergic rhinitis managed with Zyrtec  as needed. Prescription is due for refill soon. - Send prescription for Zyrtec .      Relevant Medications   cetirizine  (ZYRTEC  ALLERGY) 10 MG tablet   Mixed hyperlipidemia   Hyperlipidemia previously managed with medication. She reports heaviness in feet has returned after stopping medication. No current exercise routine, but maintains an active lifestyle.      Dizziness   Relevant Medications   meclizine  (ANTIVERT ) 12.5 MG tablet   Other Visit Diagnoses       Need for influenza vaccination       Relevant Orders   Flu vaccine trivalent PF, 6mos and older(Flulaval,Afluria,Fluarix,Fluzone) (Completed)     Screening for endocrine, nutritional, metabolic and immunity disorder       Relevant Orders   CBC with Differential/Platelet   CMP14+EGFR   Lipid panel        Follow up plan: Return in about 1 year (around 02/09/2025) for CPE.  NEXT PREVENTATIVE  PHYSICAL DUE IN 1 YEAR.  PATIENT COUNSELING PROVIDED FOR ALL ADULT PATIENTS: A well balanced diet low in saturated fats, cholesterol, and moderation in carbohydrates.  This can be as simple as monitoring portion sizes and cutting back on sugary beverages such as soda and juice to start with.    Daily water consumption of at least 64 ounces.  Physical activity at least 180 minutes per week.  If just starting out, start 10 minutes a day and work your way up.   This can be as simple as taking the stairs instead of the elevator and walking 2-3 laps around the office  purposefully every day.   STD protection, partner selection, and regular testing if high risk.  Limited consumption of alcoholic beverages if alcohol is consumed. For men, I recommend no more than 14 alcoholic beverages per week, spread out throughout the week (max 2 per day). Avoid binge drinking or consuming large quantities of alcohol in one setting.  Please let me know if you feel you may need help with reduction or quitting alcohol consumption.   Avoidance of nicotine, if used. Please let me know if you feel you may need help with reduction or quitting nicotine use.   Daily mental health attention. This can be in the form of 5 minute daily meditation, prayer, journaling, yoga, reflection, etc.  Purposeful attention to your emotions and mental state can significantly improve your overall wellbeing  and  Health.  Please know that I am here to help you with all of your health care goals and am happy to work with you to find a solution that works best for you.  The greatest advice I have received with any changes in life are to take it one step at a time, that even means if all you can focus on is the next 60 seconds, then do that and celebrate your victories.  With any changes in life, you will have set backs, and that is OK. The important thing to remember is, if you have a set back, it is not a failure, it is an opportunity  to try again! Screening Testing Mammogram Every 1 -2 years based on history and risk factors Starting at age 40 Pap Smear Ages 21-39 every 3 years Ages 52-65 every 5 years with HPV testing More frequent testing may be required based on results and history Colon Cancer Screening Every 1-10 years based on test performed, risk factors, and history Starting at age 75 Bone Density Screening Every 2-10 years based  on history Starting at age 54 for women Recommendations for men differ based on medication usage, history, and risk factors AAA Screening One time ultrasound Men 4-74 years old who have every smoked Lung Cancer Screening Low Dose Lung CT every 12 months Age 39-80 years with a 30 pack-year smoking history who still smoke or who have quit within the last 15 years   Screening Labs Routine  Labs: Complete Blood Count (CBC), Complete Metabolic Panel (CMP), Cholesterol (Lipid Panel) Every 6-12 months based on history and medications May be recommended more frequently based on current conditions or previous results Hemoglobin A1c Lab Every 3-12 months based on history and previous results Starting at age 59 or earlier with diagnosis of diabetes, high cholesterol, BMI >26, and/or risk factors Frequent monitoring for patients with diabetes to ensure blood sugar control Thyroid  Panel (TSH) Every 6 months based on history, symptoms, and risk factors May be repeated more often if on medication HIV One time testing for all patients 70 and older May be repeated more frequently for patients with increased risk factors or exposure Hepatitis C One time testing for all patients 52 and older May be repeated more frequently for patients with increased risk factors or exposure Gonorrhea, Chlamydia Every 12 months for all sexually active persons 13-24 years Additional monitoring may be recommended for those who are considered high risk or who have symptoms Every 12 months for any woman on  birth control, regardless of sexual activity PSA Men 9-50 years old with risk factors Additional screening may be recommended from age 29-69 based on risk factors, symptoms, and history  Vaccine Recommendations Tetanus Booster All adults every 10 years Flu Vaccine All patients 6 months and older every year COVID Vaccine All patients 12 years and older Initial dosing with booster May recommend additional booster based on age and health history HPV Vaccine 2 doses all patients age 71-26 Dosing may be considered for patients over 26 Shingles Vaccine (Shingrix) 2 doses all adults 55 years and older Pneumonia (Pneumovax 63) All adults 65 years and older May recommend earlier dosing based on health history One year apart from Prevnar 4 Pneumonia (Prevnar 44) All adults 65 years and older Dosed 1 year after Pneumovax 23 Pneumonia (Prevnar 20) One time alternative to the two dosing of 13 and 23 For all adults with initial dose of 23, 20 is recommended 1 year later For all adults with initial dose of 13, 23 is still recommended as second option 1 year later

## 2024-02-10 ENCOUNTER — Ambulatory Visit: Payer: Medicaid Other | Admitting: Nurse Practitioner

## 2024-02-10 ENCOUNTER — Encounter: Payer: Self-pay | Admitting: Nurse Practitioner

## 2024-02-10 VITALS — BP 122/80 | HR 71 | Ht 60.0 in | Wt 171.0 lb

## 2024-02-10 DIAGNOSIS — Z1329 Encounter for screening for other suspected endocrine disorder: Secondary | ICD-10-CM

## 2024-02-10 DIAGNOSIS — Z130 Encounter for screening for diseases of the blood and blood-forming organs and certain disorders involving the immune mechanism: Secondary | ICD-10-CM

## 2024-02-10 DIAGNOSIS — Z13228 Encounter for screening for other metabolic disorders: Secondary | ICD-10-CM

## 2024-02-10 DIAGNOSIS — Z0000 Encounter for general adult medical examination without abnormal findings: Secondary | ICD-10-CM | POA: Diagnosis not present

## 2024-02-10 DIAGNOSIS — Z23 Encounter for immunization: Secondary | ICD-10-CM

## 2024-02-10 DIAGNOSIS — G4700 Insomnia, unspecified: Secondary | ICD-10-CM

## 2024-02-10 DIAGNOSIS — K5904 Chronic idiopathic constipation: Secondary | ICD-10-CM

## 2024-02-10 DIAGNOSIS — R42 Dizziness and giddiness: Secondary | ICD-10-CM

## 2024-02-10 DIAGNOSIS — J302 Other seasonal allergic rhinitis: Secondary | ICD-10-CM | POA: Insufficient documentation

## 2024-02-10 DIAGNOSIS — F411 Generalized anxiety disorder: Secondary | ICD-10-CM

## 2024-02-10 DIAGNOSIS — Z1321 Encounter for screening for nutritional disorder: Secondary | ICD-10-CM

## 2024-02-10 DIAGNOSIS — E782 Mixed hyperlipidemia: Secondary | ICD-10-CM | POA: Insufficient documentation

## 2024-02-10 LAB — CMP14+EGFR
ALT: 9 [IU]/L (ref 0–32)
AST: 12 [IU]/L (ref 0–40)
Albumin: 4 g/dL (ref 3.9–4.9)
Alkaline Phosphatase: 55 [IU]/L (ref 41–116)
BUN/Creatinine Ratio: 12 (ref 9–23)
BUN: 7 mg/dL (ref 6–20)
Bilirubin Total: 0.5 mg/dL (ref 0.0–1.2)
CO2: 21 mmol/L (ref 20–29)
Calcium: 9.3 mg/dL (ref 8.7–10.2)
Chloride: 103 mmol/L (ref 96–106)
Creatinine, Ser: 0.58 mg/dL (ref 0.57–1.00)
Globulin, Total: 2.7 g/dL (ref 1.5–4.5)
Glucose: 82 mg/dL (ref 70–99)
Potassium: 4.3 mmol/L (ref 3.5–5.2)
Sodium: 137 mmol/L (ref 134–144)
Total Protein: 6.7 g/dL (ref 6.0–8.5)
eGFR: 119 mL/min/{1.73_m2}

## 2024-02-10 LAB — LIPID PANEL
Chol/HDL Ratio: 5.1 ratio — ABNORMAL HIGH (ref 0.0–4.4)
Cholesterol, Total: 249 mg/dL — ABNORMAL HIGH (ref 100–199)
HDL: 49 mg/dL (ref >39)
LDL Chol Calc (NIH): 161 mg/dL — ABNORMAL HIGH (ref 0–99)
Triglycerides: 210 mg/dL — ABNORMAL HIGH (ref 0–149)
VLDL Cholesterol Cal: 39 mg/dL (ref 5–40)

## 2024-02-10 LAB — CBC WITH DIFFERENTIAL/PLATELET
Basophils Absolute: 0 10*3/uL (ref 0.0–0.2)
Basos: 0 %
EOS (ABSOLUTE): 0.1 10*3/uL (ref 0.0–0.4)
Eos: 1 %
Hematocrit: 39.6 % (ref 34.0–46.6)
Hemoglobin: 13 g/dL (ref 11.1–15.9)
Immature Grans (Abs): 0 10*3/uL (ref 0.0–0.1)
Immature Granulocytes: 0 %
Lymphocytes Absolute: 3.4 10*3/uL — ABNORMAL HIGH (ref 0.7–3.1)
Lymphs: 34 %
MCH: 29.9 pg (ref 26.6–33.0)
MCHC: 32.8 g/dL (ref 31.5–35.7)
MCV: 91 fL (ref 79–97)
Monocytes Absolute: 0.4 10*3/uL (ref 0.1–0.9)
Monocytes: 4 %
Neutrophils Absolute: 6.1 10*3/uL (ref 1.4–7.0)
Neutrophils: 61 %
Platelets: 410 10*3/uL (ref 150–450)
RBC: 4.35 x10E6/uL (ref 3.77–5.28)
RDW: 11.8 % (ref 11.7–15.4)
WBC: 10 10*3/uL (ref 3.4–10.8)

## 2024-02-10 MED ORDER — HYDROXYZINE PAMOATE 50 MG PO CAPS
50.0000 mg | ORAL_CAPSULE | Freq: Every evening | ORAL | 3 refills | Status: AC | PRN
Start: 2024-02-10 — End: ?

## 2024-02-10 MED ORDER — MECLIZINE HCL 12.5 MG PO TABS: 12.5000 mg | ORAL_TABLET | Freq: Three times a day (TID) | ORAL | 1 refills | Status: AC | PRN

## 2024-02-10 MED ORDER — CETIRIZINE HCL 10 MG PO TABS: 10.0000 mg | ORAL_TABLET | Freq: Every day | ORAL | 1 refills | Status: DC

## 2024-02-10 NOTE — Assessment & Plan Note (Signed)
 No concerns present at this time. Hydroxyzine  PRN for flairs and symptomatic hives.

## 2024-02-10 NOTE — Assessment & Plan Note (Signed)
 Allergic rhinitis managed with Zyrtec  as needed. Prescription is due for refill soon. - Send prescription for Zyrtec .

## 2024-02-10 NOTE — Assessment & Plan Note (Signed)
 Constipation managed with Motegrity . She reports issues when there was a delay in receiving medication, but symptoms resolved once medication was resumed.

## 2024-02-10 NOTE — Assessment & Plan Note (Signed)
 Hyperlipidemia previously managed with medication. She reports heaviness in feet has returned after stopping medication. No current exercise routine, but maintains an active lifestyle.

## 2024-02-10 NOTE — Assessment & Plan Note (Signed)
Improved symptoms with PRN hydroxyzine and control of panic symptoms. No alarm symptoms present today. Continue current treatment and monitor for new or worsening symptoms and report immediately. F/U in 6 months or sooner if needed.  ?

## 2024-02-10 NOTE — Assessment & Plan Note (Signed)

## 2024-02-10 NOTE — Patient Instructions (Signed)
 I recommend working on getting in at least 20 minutes of sustained activity every day to help with overall heart health. This is very important.   I have sent in refills of your hydroxyzine , meclizine , and cetirizine  for sleep, dizziness, and allergies. If you need other refills, please let me know.    For all adult patients, I recommend A well balanced diet low in saturated fats, cholesterol, and moderation in carbohydrates.   This can be as simple as monitoring portion sizes and cutting back on sugary beverages such as soda and juice to start with.    Daily water consumption of at least 64 ounces.  Physical activity at least 180 minutes per week, if just starting out.   This can be as simple as taking the stairs instead of the elevator and walking 2-3 laps around the office  purposefully every day.   STD protection, partner selection, and regular testing if high risk.  Limited consumption of alcoholic beverages if alcohol is consumed.  For women, I recommend no more than 7 alcoholic beverages per week, spread out throughout the week.  Avoid binge drinking or consuming large quantities of alcohol in one setting.   Please let me know if you feel you may need help with reduction or quitting alcohol consumption.   Avoidance of nicotine, if used.  Please let me know if you feel you may need help with reduction or quitting nicotine use.   Daily mental health attention.  This can be in the form of 5 minute daily meditation, prayer, journaling, yoga, reflection, etc.   Purposeful attention to your emotions and mental state can significantly improve your overall wellbeing  and  Health.  Please know that I am here to help you with all of your health care goals and am happy to work with you to find a solution that works best for you.  The greatest advice I have received with any changes in life are to take it one step at a time, that even means if all you can focus on is the next 60 seconds,  then do that and celebrate your victories.  With any changes in life, you will have set backs, and that is OK. The important thing to remember is, if you have a set back, it is not a failure, it is an opportunity to try again!  Health Maintenance Recommendations Screening Testing Mammogram Every 1 -2 years based on history and risk factors Starting at age 77 Pap Smear Ages 21-39 every 3 years Ages 73-65 every 5 years with HPV testing More frequent testing may be required based on results and history Colon Cancer Screening Every 1-10 years based on test performed, risk factors, and history Starting at age 39 Bone Density Screening Every 2-10 years based on history Starting at age 20 for women Recommendations for men differ based on medication usage, history, and risk factors AAA Screening One time ultrasound Men 42-1 years old who have every smoked Lung Cancer Screening Low Dose Lung CT every 12 months Age 23-80 years with a 30 pack-year smoking history who still smoke or who have quit within the last 15 years  Screening Labs Routine  Labs: Complete Blood Count (CBC), Complete Metabolic Panel (CMP), Cholesterol (Lipid Panel) Every 6-12 months based on history and medications May be recommended more frequently based on current conditions or previous results Hemoglobin A1c Lab Every 3-12 months based on history and previous results Starting at age 35 or earlier with diagnosis of diabetes, high  cholesterol, BMI >26, and/or risk factors Frequent monitoring for patients with diabetes to ensure blood sugar control Thyroid  Panel (TSH w/ T3 & T4) Every 6 months based on history, symptoms, and risk factors May be repeated more often if on medication HIV One time testing for all patients 75 and older May be repeated more frequently for patients with increased risk factors or exposure Hepatitis C One time testing for all patients 61 and older May be repeated more frequently for  patients with increased risk factors or exposure Gonorrhea, Chlamydia Every 12 months for all sexually active persons 13-24 years Additional monitoring may be recommended for those who are considered high risk or who have symptoms PSA Men 36-10 years old with risk factors Additional screening may be recommended from age 66-69 based on risk factors, symptoms, and history  Vaccine Recommendations Tetanus Booster All adults every 10 years Flu Vaccine All patients 6 months and older every year COVID Vaccine All patients 12 years and older Initial dosing with booster May recommend additional booster based on age and health history HPV Vaccine 2 doses all patients age 83-26 Dosing may be considered for patients over 26 Shingles Vaccine (Shingrix) 2 doses all adults 55 years and older Pneumonia (Pneumovax 23) All adults 65 years and older May recommend earlier dosing based on health history Pneumonia (Prevnar 68) All adults 65 years and older Dosed 1 year after Pneumovax 23  Additional Screening, Testing, and Vaccinations may be recommended on an individualized basis based on family history, health history, risk factors, and/or exposure.

## 2024-02-11 ENCOUNTER — Encounter: Payer: Self-pay | Admitting: Nurse Practitioner

## 2024-02-17 ENCOUNTER — Telehealth: Admitting: Physician Assistant

## 2024-02-17 DIAGNOSIS — H6990 Unspecified Eustachian tube disorder, unspecified ear: Secondary | ICD-10-CM | POA: Diagnosis not present

## 2024-02-17 MED ORDER — IPRATROPIUM BROMIDE 0.03 % NA SOLN: 2.0000 | Freq: Two times a day (BID) | NASAL | 0 refills | Status: DC

## 2024-02-17 NOTE — Progress Notes (Signed)

## 2024-02-18 ENCOUNTER — Ambulatory Visit: Payer: Self-pay | Admitting: Nurse Practitioner

## 2024-02-18 DIAGNOSIS — E782 Mixed hyperlipidemia: Secondary | ICD-10-CM

## 2024-03-09 ENCOUNTER — Telehealth

## 2024-03-09 DIAGNOSIS — B9789 Other viral agents as the cause of diseases classified elsewhere: Secondary | ICD-10-CM

## 2024-03-09 DIAGNOSIS — J019 Acute sinusitis, unspecified: Secondary | ICD-10-CM

## 2024-03-09 DIAGNOSIS — R051 Acute cough: Secondary | ICD-10-CM

## 2024-03-10 MED ORDER — PSEUDOEPH-BROMPHEN-DM 30-2-10 MG/5ML PO SYRP: 5.0000 mL | ORAL_SOLUTION | Freq: Four times a day (QID) | ORAL | 0 refills | Status: DC | PRN

## 2024-03-10 NOTE — Progress Notes (Signed)
 We are sorry that you are not feeling well.  Here is how we plan to help!  Based on what you have shared with me it looks like you have sinusitis.  Sinusitis is inflammation and infection in the sinus cavities of the head.  Based on your presentation I believe you most likely have Acute Viral Sinusitis.This is an infection most likely caused by a virus. There is not specific treatment for viral sinusitis other than to help you with the symptoms until the infection runs its course.  You may use an oral decongestant such as Mucinex D or if you have glaucoma or high blood pressure use plain Mucinex. Saline nasal spray help and can safely be used as often as needed for congestion. Continue your Flonase  nasal spray and Ipratropium Bromide  nasal spray for congestion and drainage.  I have prescribed Bromfed DM cough syrup for the cough and congestion. Take 5mL every 6 hours as needed.   Some authorities believe that zinc sprays or the use of Echinacea may shorten the course of your symptoms.  Sinus infections are not as easily transmitted as other respiratory infection, however we still recommend that you avoid close contact with loved ones, especially the very young and elderly.  Remember to wash your hands thoroughly throughout the day as this is the number one way to prevent the spread of infection!  Home Care: Only take medications as instructed by your medical team. Do not take these medications with alcohol. A steam or ultrasonic humidifier can help congestion.  You can place a towel over your head and breathe in the steam from hot water coming from a faucet. Avoid close contacts especially the very young and the elderly. Cover your mouth when you cough or sneeze. Always remember to wash your hands.  Get Help Right Away If: You develop worsening fever or sinus pain. You develop a severe head ache or visual changes. Your symptoms persist after you have completed your treatment plan.  Make sure  you Understand these instructions. Will watch your condition. Will get help right away if you are not doing well or get worse.  Your e-visit answers were reviewed by a board certified advanced clinical practitioner to complete your personal care plan.  Depending on the condition, your plan could have included both over the counter or prescription medications.  If there is a problem please reply  once you have received a response from your provider.  Your safety is important to us .  If you have drug allergies check your prescription carefully.    You can use MyChart to ask questions about today's visit, request a non-urgent call back, or ask for a work or school excuse for 24 hours related to this e-Visit. If it has been greater than 24 hours you will need to follow up with your provider, or enter a new e-Visit to address those concerns.  You will get an e-mail in the next two days asking about your experience.  I hope that your e-visit has been valuable and will speed your recovery. Thank you for using e-visits.  I have spent 5 minutes in review of e-visit questionnaire, review and updating patient chart, medical decision making and response to patient.   Delon CHRISTELLA Dickinson, PA-C

## 2024-03-15 ENCOUNTER — Encounter: Payer: Self-pay | Admitting: Radiology

## 2024-03-25 ENCOUNTER — Other Ambulatory Visit: Payer: Self-pay | Admitting: Family

## 2024-03-25 DIAGNOSIS — J069 Acute upper respiratory infection, unspecified: Secondary | ICD-10-CM

## 2024-04-22 ENCOUNTER — Ambulatory Visit (INDEPENDENT_AMBULATORY_CARE_PROVIDER_SITE_OTHER)

## 2024-04-22 ENCOUNTER — Ambulatory Visit: Admitting: Podiatry

## 2024-04-22 ENCOUNTER — Encounter: Payer: Self-pay | Admitting: Podiatry

## 2024-04-22 DIAGNOSIS — M7751 Other enthesopathy of right foot: Secondary | ICD-10-CM

## 2024-04-22 DIAGNOSIS — M7662 Achilles tendinitis, left leg: Secondary | ICD-10-CM | POA: Diagnosis not present

## 2024-04-22 DIAGNOSIS — M7752 Other enthesopathy of left foot: Secondary | ICD-10-CM

## 2024-04-22 DIAGNOSIS — M7661 Achilles tendinitis, right leg: Secondary | ICD-10-CM

## 2024-04-22 DIAGNOSIS — M722 Plantar fascial fibromatosis: Secondary | ICD-10-CM | POA: Diagnosis not present

## 2024-04-22 DIAGNOSIS — M778 Other enthesopathies, not elsewhere classified: Secondary | ICD-10-CM

## 2024-04-22 MED ORDER — TRIAMCINOLONE ACETONIDE 40 MG/ML IJ SUSP
40.0000 mg | Freq: Once | INTRAMUSCULAR | Status: AC
  Administered 2024-04-22: 40 mg

## 2024-04-22 NOTE — Progress Notes (Signed)
 Patient presents with complaint of tenderness in the back of the heel and also some around the ankle bilaterally.  Worse when she is standing and walking.  She also gets some tingling in the feet if she has been standing long enough.  Does not recall any injury to the foot.  Has not noticed any redness swelling or ecchymosis.   Physical exam:  General appearance: Pleasant, and in no acute distress. AOx3.  Vascular: Pedal pulses: DP 2/4 bilaterally, PT 2/4 bilaterally. Minimal edema lower legs bilaterally. Capillary fill time immediate bilaterally.  Neurological: Light touch intact feet bilaterally.  Normal Achilles reflex bilaterally.  No clonus or spasticity noted.  Positive Tinel sign at tarsal tunnel and porta pedis bilaterally  Dermatologic:   Skin normal temperature bilaterally.  Skin normal color, tone, and texture bilaterally.   Musculoskeletal: Tenderness plantar medial aspect heel at the medial plantar calcaneal tubercle bilaterally.  Some tenderness in the posterior aspect of the heel at the insertion Achilles tendon.  Some tenderness palpation of the sinus tarsi and with range of motion subtalar joint bilaterally  Radiographs: 3 views bilaterally: No evidence of fractures or dislocations.  Normal Kager's triangle.  Normal bone density no Insa any bone tumors.  No osteophytic changes noted.  Diagnosis: 1.  Achilles tendinitis bilaterally. 2.  Plantar fasciitis bilaterally. 3.  Capsulitis subtalar joint bilaterally.  Plan: -New patient office visit for evaluation and management level 3.  Modifier 25. - Discussed with her the plantar fasciitis and Achilles tendinitis.  Sellick plantar fasciitis is a primary problem.  Will try injections today.  Also discussed proper shoes with her.  Will give her stretching exercises to do.  These will help with the Achilles tendinitis and subtalar joint capsulitis also. -Gave a written instructions for exercises and at home therapy she can  do. -injected 3cc 2:1 mixture 0.5 cc Marcaine : Triamcinolone  40mg /34ml at plantar fascia origin medial plantar calcaneal tubercle bilaterally.    Return 1 week follow-up injections plantar fascia bilaterally

## 2024-04-28 ENCOUNTER — Encounter: Payer: Self-pay | Admitting: Nurse Practitioner

## 2024-04-28 ENCOUNTER — Encounter: Payer: Self-pay | Admitting: Podiatry

## 2024-04-29 ENCOUNTER — Telehealth: Payer: Self-pay

## 2024-04-29 ENCOUNTER — Ambulatory Visit: Admitting: Podiatry

## 2024-04-29 ENCOUNTER — Telehealth: Admitting: Physician Assistant

## 2024-04-29 ENCOUNTER — Other Ambulatory Visit (HOSPITAL_COMMUNITY): Payer: Self-pay

## 2024-04-29 DIAGNOSIS — J101 Influenza due to other identified influenza virus with other respiratory manifestations: Secondary | ICD-10-CM

## 2024-04-29 MED ORDER — BENZONATATE 100 MG PO CAPS: 100.0000 mg | ORAL_CAPSULE | Freq: Three times a day (TID) | ORAL | 0 refills | Status: DC | PRN

## 2024-04-29 MED ORDER — OSELTAMIVIR PHOSPHATE 75 MG PO CAPS: 75.0000 mg | ORAL_CAPSULE | Freq: Two times a day (BID) | ORAL | 0 refills | Status: DC

## 2024-04-29 NOTE — Progress Notes (Signed)
 E visit for Flu like symptoms   We are sorry that you are not feeling well.  Here is how we plan to help! Based on what you have shared with me it looks like you may have a respiratory virus that may be influenza.  Influenza or "the flu" is  an infection caused by a respiratory virus. The flu virus is highly contagious and persons who did not receive their yearly flu vaccination may "catch" the flu from close contact.  We have anti-viral medications to treat the viruses that cause this infection. They are not a "cure" and only shorten the course of the infection. These prescriptions are most effective when they are given within the first 2 days of "flu" symptoms. Antiviral medications are indicated if you have a high risk of complications from the flu. You should  also consider an antiviral medication if you are in close contact with someone who is at risk. These medications can help patients avoid complications from the flu but have side effects that you should know.   Possible side effects from Tamiflu or oseltamivir include nausea, vomiting, diarrhea, dizziness, headaches, eye redness, sleep problems or other respiratory symptoms. You should not take Tamiflu if you have an allergy to oseltamivir or any to the ingredients in Tamiflu.  Based upon your symptoms and potential risk factors I have prescribed Oseltamivir (Tamiflu).  It has been sent to your designated pharmacy.  You will take one 75 mg capsule orally twice a day for the next 5 days.   For nasal congestion, you may use an oral decongestant such as Mucinex  D or if you have glaucoma or high blood pressure use plain Mucinex .  Saline nasal spray or nasal drops can help and can safely be used as often as needed for congestion.  If you have a sore or scratchy throat, use a saltwater gargle-  to  teaspoon of salt dissolved in a 4-ounce to 8-ounce glass of warm water.  Gargle the solution for approximately 15-30 seconds and then spit.  It is  important not to swallow the solution.  You can also use throat lozenges/cough drops and Chloraseptic spray to help with throat pain or discomfort.  Warm or cold liquids can also be helpful in relieving throat pain.  For headache, pain or general discomfort, you can use Ibuprofen  or Tylenol  as directed.   Some authorities believe that zinc sprays or the use of Echinacea may shorten the course of your symptoms.  I have prescribed the following medications to help lessen symptoms: I have prescribed Tessalon  Perles 100 mg. You may take 1-2 capsules every 8 hours as needed for cough  You are to isolate at home until you have been fever-free for at least 24 hours without a fever-reducing medication, and symptoms have been steadily improving for 24 hours.  If you must be around other household members who do not have symptoms, you need to make sure that both you and the family members are masking consistently with a high-quality mask.  If you note any worsening of symptoms despite treatment, please seek an in-person evaluation ASAP. If you note any significant shortness of breath or any chest pain, please seek ED evaluation. Please do not delay care!  ANYONE WHO HAS FLU SYMPTOMS SHOULD: Stay home. The flu is highly contagious and going out or to work exposes others! Be sure to drink plenty of fluids. Water is fine as well as fruit juices, sodas and electrolyte beverages. You may want to stay  away from caffeine or alcohol. If you are nauseated, try taking small sips of liquids. How do you know if you are getting enough fluid? Your urine should be a pale yellow or almost colorless. Get rest. Taking a steamy shower or using a humidifier may help nasal congestion and ease sore throat pain. Using a saline nasal spray works much the same way. Cough drops, hard candies and sore throat lozenges may ease your cough. Line up a caregiver. Have someone check on you regularly.  GET HELP RIGHT AWAY IF: You cannot  keep down liquids or your medications. You become short of breath Your fell like you are going to pass out or loose consciousness. Your symptoms persist after you have completed your treatment plan  MAKE SURE YOU  Understand these instructions. Will watch your condition. Will get help right away if you are not doing well or get worse.  Your e-visit answers were reviewed by a board certified advanced clinical practitioner to complete your personal care plan.  Depending on the condition, your plan could have included both over the counter or prescription medications.  If there is a problem please reply  once you have received a response from your provider.  Your safety is important to us .  If you have drug allergies check your prescription carefully.    You can use MyChart to ask questions about today's visit, request a non-urgent call back, or ask for a work or school excuse for 24 hours related to this e-Visit. If it has been greater than 24 hours you will need to follow up with your provider, or enter a new e-Visit to address those concerns.  You will get an e-mail in the next two days asking about your experience.  I hope that your e-visit has been valuable and will speed your recovery. Thank you for using e-visits.   I have spent 5 minutes in review of e-visit questionnaire, review and updating patient chart, medical decision making and response to patient.   Elsie Velma Lunger, PA-C

## 2024-04-29 NOTE — Progress Notes (Signed)
 Message sent to patient requesting further input regarding current symptoms. Awaiting patient response.

## 2024-04-29 NOTE — Telephone Encounter (Signed)
 Pharmacy Patient Advocate Encounter   Received notification from Fax that prior authorization for Nurtec is required/requested.   Insurance verification completed.   The patient is insured through [facility] MEDICAID.   Per test claim: PA required; PA submitted to above mentioned insurance via Latent Key/confirmation #/EOC [facility] Status is pending

## 2024-04-29 NOTE — Telephone Encounter (Signed)
 Pharmacy Patient Advocate Encounter  Received notification from [facility] MEDICAID that Prior Authorization for Nurtec has been APPROVED from 04/29/2024 to 04/29/2025. Ran test claim, Copay is $4.00. This test claim was processed through [facility]- copay amounts may vary at other pharmacies due to pharmacy/plan contracts, or as the patient moves through the different stages of their insurance plan.   PA #/Case ID/Reference #: [id number]

## 2024-04-30 IMAGING — US US PELVIS COMPLETE WITH TRANSVAGINAL
1 series · 14 of 25 positions shown · non-contrast
Comparison: None Available.

CLINICAL DATA: Pelvic pain with intercourse, LEFT greater than
RIGHT. Symptoms for 3 weeks. LMP 08/14/2021

EXAM:
TRANSABDOMINAL AND TRANSVAGINAL ULTRASOUND OF PELVIS
TECHNIQUE: Both transabdominal and transvaginal ultrasound examinations of the
pelvis were performed. Transabdominal technique was performed for
global imaging of the pelvis including uterus, ovaries, adnexal
regions, and pelvic cul-de-sac. It was necessary to proceed with
endovaginal exam following the transabdominal exam to visualize the
endometrium and ovaries.

[Series 1: us pelvic complete with transvaginal · 14 of 74 slices shown]
[im 1/74]
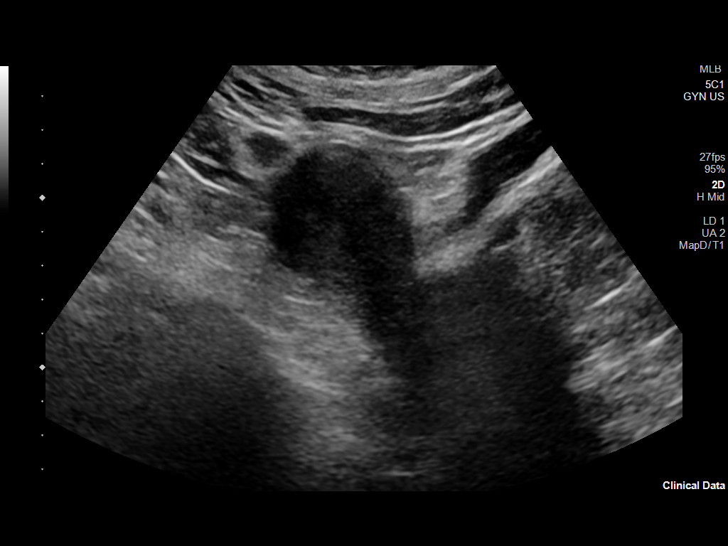
[im 7/74]
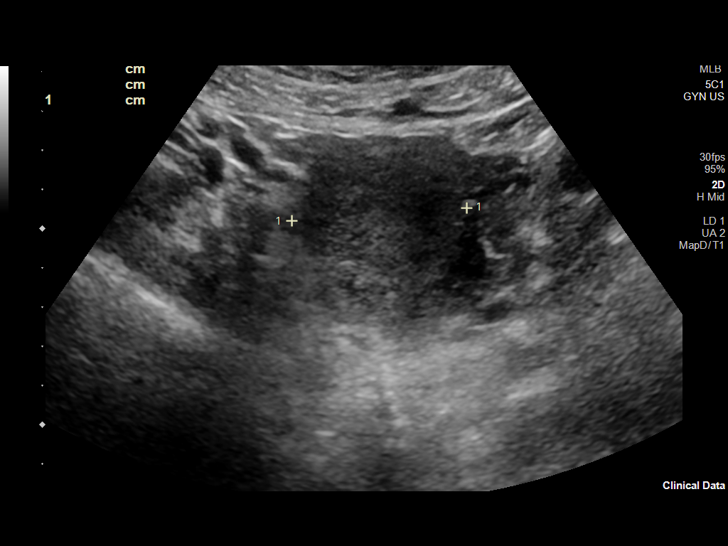
[im 13/74]
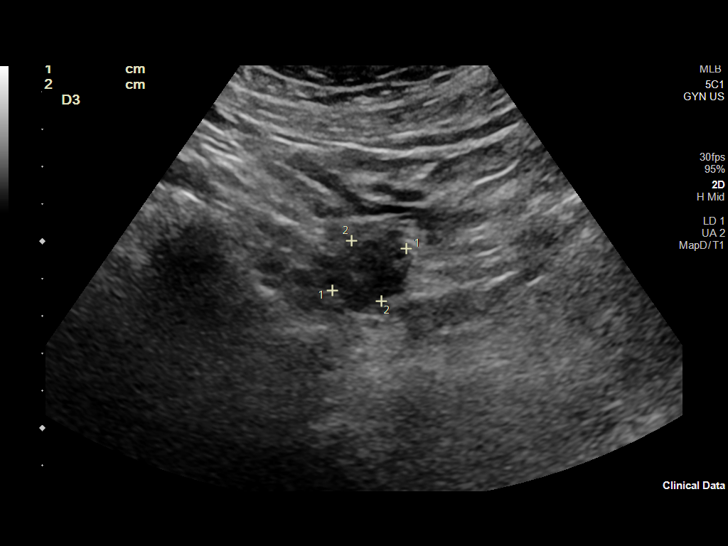
[im 19/74]
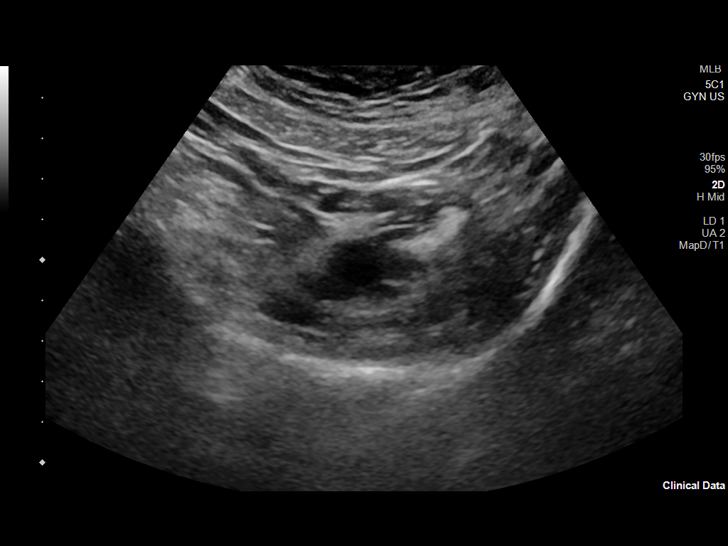
[im 25/74]
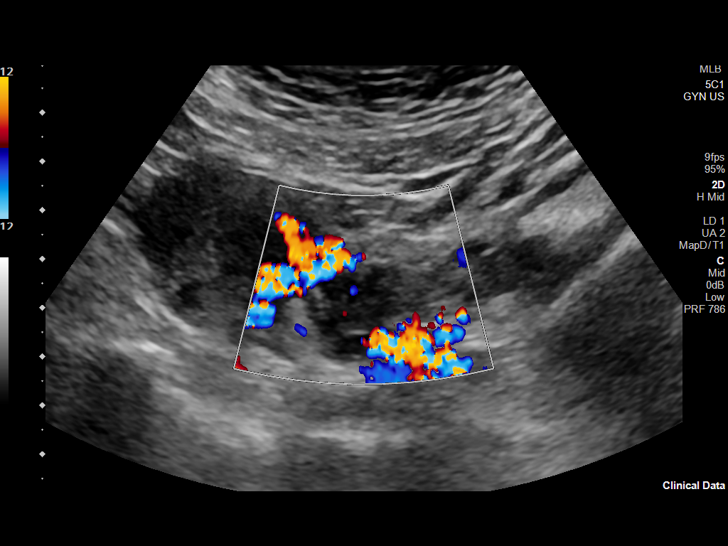
[im 28/74]
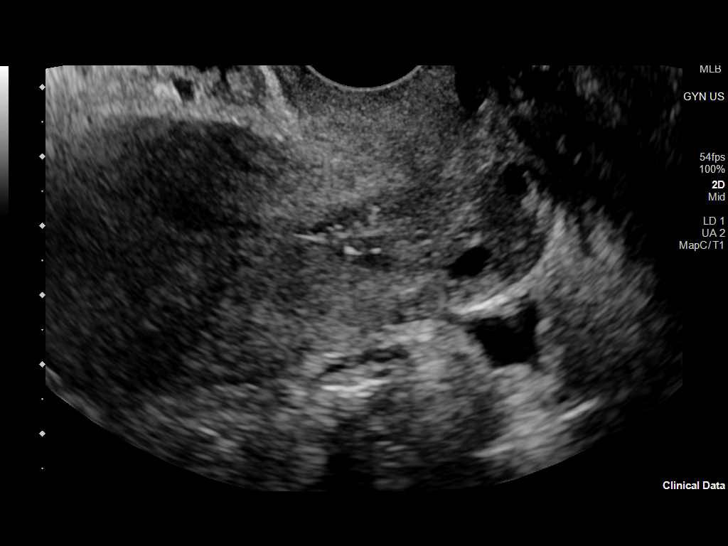
[im 34/74]
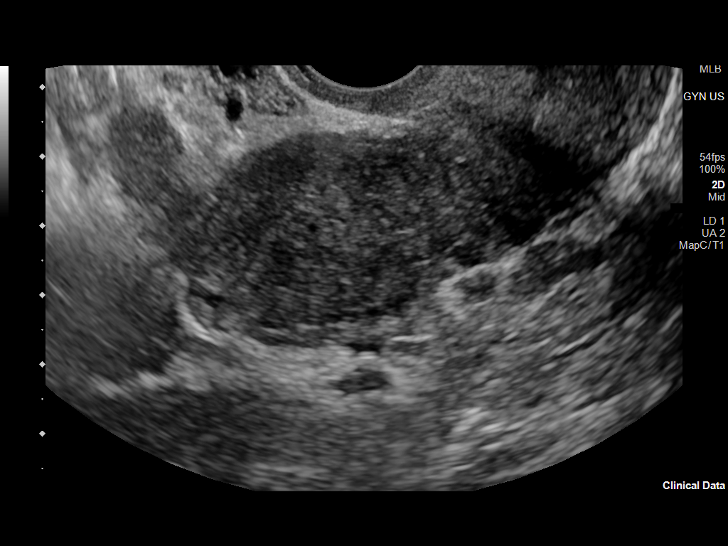
[im 40/74]
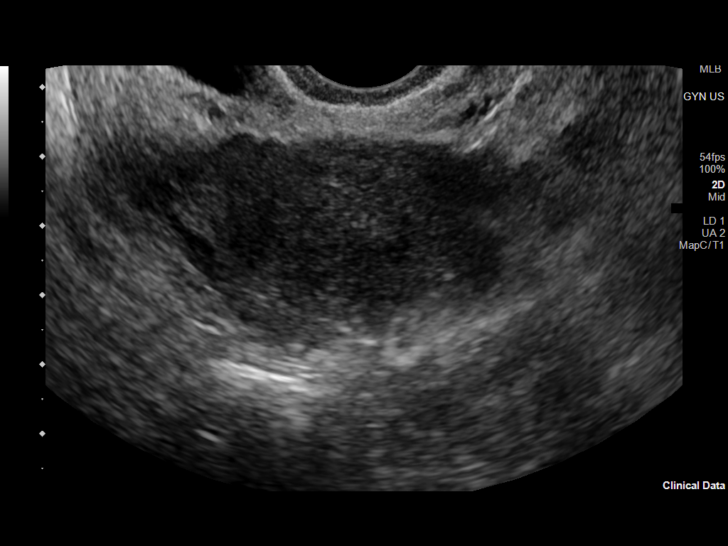
[im 46/74]
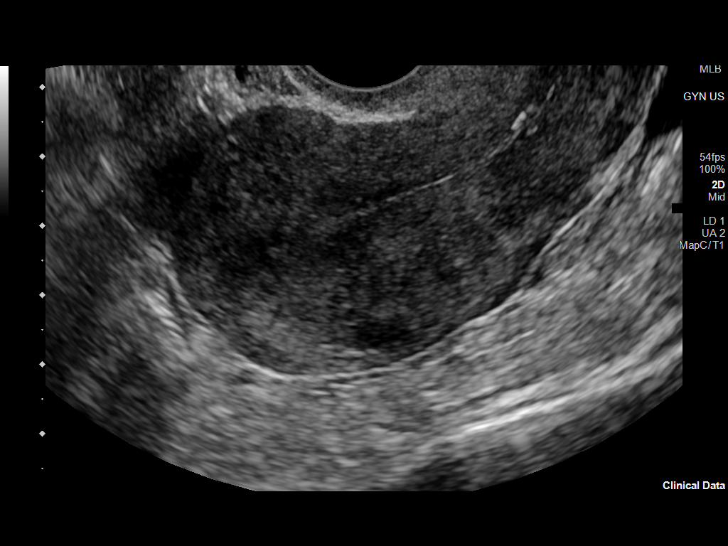
[im 49/74]
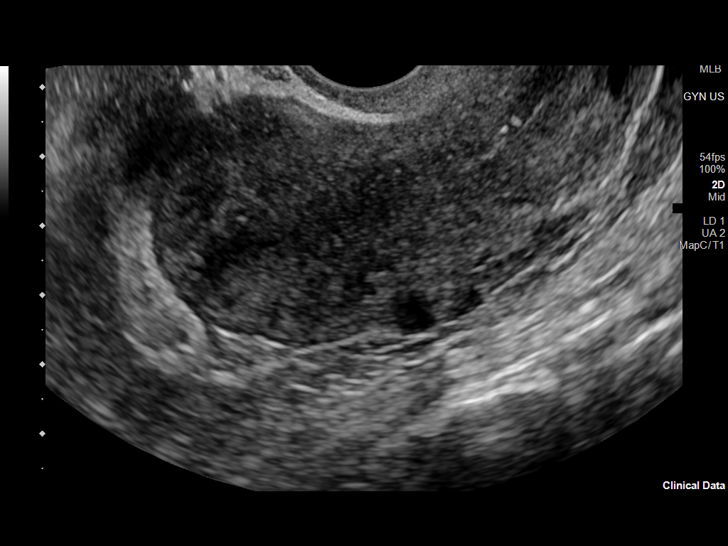
[im 55/74]
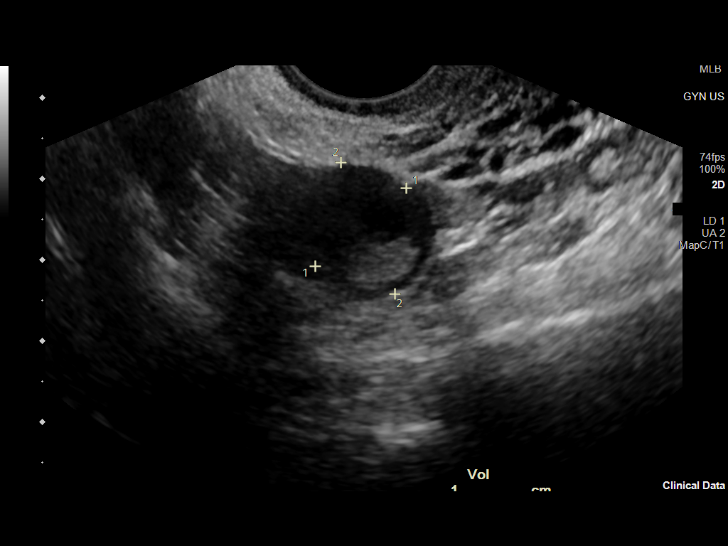
[im 61/74]
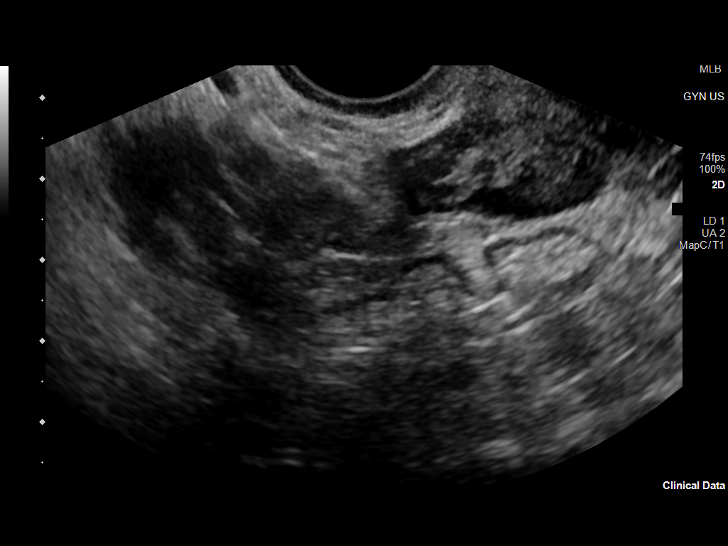
[im 67/74]
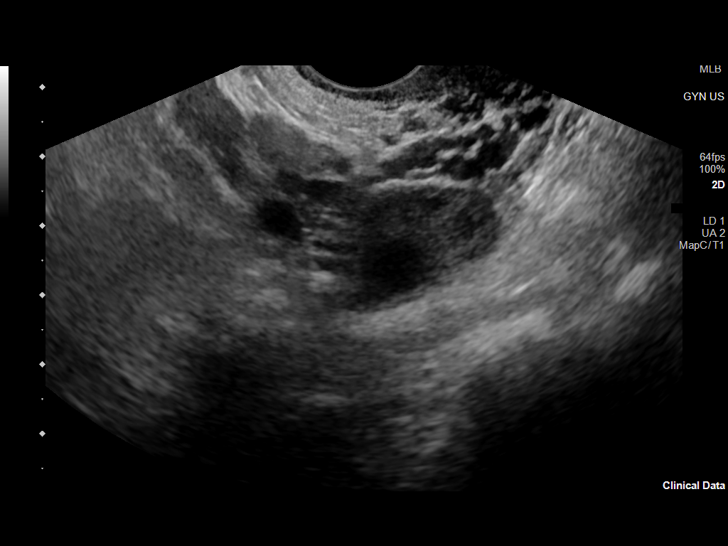
[im 74/74]
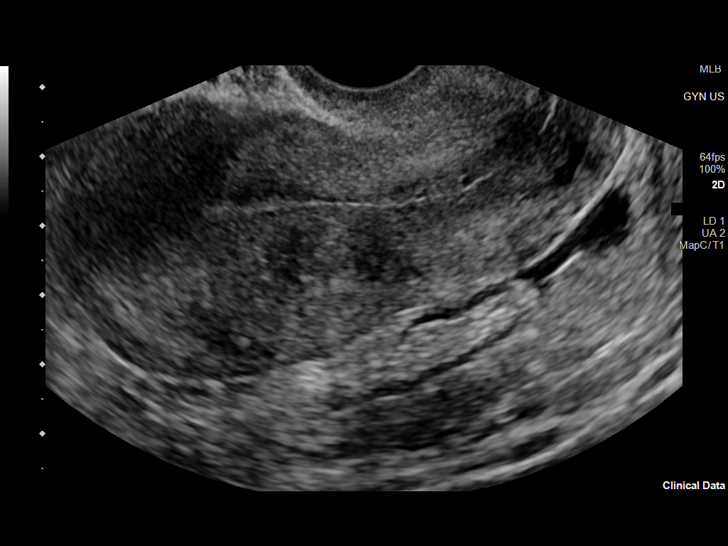

[14 of 25 positions shown; findings below may reference images not displayed]

FINDINGS: Uterus

Measurements: 8.2 x 4.2 x 4.4 centimeters = volume: 78.8 mL. Uterus
is anteverted and mildly heterogeneous. Small posterior fibroid is
0.6 x 0.7 x 0.6 centimeters.

Endometrium

Thickness: 7.3.  No focal abnormality visualized.

Right ovary

Measurements: 1.5 x 1.81.3 centimeters = volume: 1.7 mL. Normal
appearance/no adnexal mass.

Left ovary

Measurements: 2.6 x 1.6 x 1.6 centimeters = volume: 3.7 mL. Normal
appearance/no adnexal mass.

Other findings

Trace free pelvic fluid is likely physiologic.
IMPRESSION: Normal pelvic ultrasound.

## 2024-05-03 ENCOUNTER — Ambulatory Visit: Admitting: Nurse Practitioner

## 2024-05-03 ENCOUNTER — Other Ambulatory Visit (HOSPITAL_COMMUNITY): Payer: Self-pay

## 2024-05-03 VITALS — BP 118/74 | HR 76 | Temp 98.9°F | Wt 169.2 lb

## 2024-05-03 DIAGNOSIS — J029 Acute pharyngitis, unspecified: Secondary | ICD-10-CM

## 2024-05-03 DIAGNOSIS — B9689 Other specified bacterial agents as the cause of diseases classified elsewhere: Secondary | ICD-10-CM | POA: Diagnosis not present

## 2024-05-03 DIAGNOSIS — R059 Cough, unspecified: Secondary | ICD-10-CM

## 2024-05-03 DIAGNOSIS — B379 Candidiasis, unspecified: Secondary | ICD-10-CM

## 2024-05-03 DIAGNOSIS — J069 Acute upper respiratory infection, unspecified: Secondary | ICD-10-CM | POA: Diagnosis not present

## 2024-05-03 LAB — POCT INFLUENZA A/B
Influenza A, POC: NEGATIVE
Influenza B, POC: NEGATIVE

## 2024-05-03 LAB — POC COVID19 BINAXNOW: SARS Coronavirus 2 Ag: NEGATIVE

## 2024-05-03 LAB — POCT RAPID STREP A (OFFICE): Rapid Strep A Screen: NEGATIVE

## 2024-05-03 MED ORDER — HYDROCODONE BIT-HOMATROP MBR 5-1.5 MG/5ML PO SOLN
5.0000 mL | Freq: Three times a day (TID) | ORAL | 0 refills | Status: DC | PRN
  Filled 2024-05-03: qty 120, 8d supply, fill #0

## 2024-05-03 MED ORDER — PREDNISONE 20 MG PO TABS
20.0000 mg | ORAL_TABLET | Freq: Every day | ORAL | 0 refills | Status: AC
  Filled 2024-05-03: qty 6, 6d supply, fill #0

## 2024-05-03 MED ORDER — AZITHROMYCIN 250 MG PO TABS
ORAL_TABLET | ORAL | 0 refills | Status: AC
  Filled 2024-05-03: qty 6, 5d supply, fill #0

## 2024-05-03 MED ORDER — FLUCONAZOLE 150 MG PO TABS
150.0000 mg | ORAL_TABLET | Freq: Once | ORAL | 0 refills | Status: AC
  Filled 2024-05-03: qty 1, 1d supply, fill #0

## 2024-05-03 MED ORDER — ALBUTEROL SULFATE HFA 108 (90 BASE) MCG/ACT IN AERS
1.0000 | INHALATION_SPRAY | Freq: Four times a day (QID) | RESPIRATORY_TRACT | 0 refills | Status: AC | PRN
  Filled 2024-05-03: qty 6.7, 25d supply, fill #0

## 2024-05-03 NOTE — Patient Instructions (Addendum)
 " I sent Prednisone  to help with inflammation in the lungs (this will also help with sore throat), Azithromycin  for infection, an inhaler to help with wheezing and cough, and a cough syrup to help with pain and cough.   I also sent diflucan  for any signs of yeast infection.   Upper Respiratory Infection, Adult An upper respiratory infection (URI) is a common viral infection of the nose, throat, and upper air passages that lead to the lungs. The most common type of URI is the common cold. URIs usually get better on their own, without medical treatment. What are the causes? A URI is caused by a virus. You may catch a virus by: Breathing in droplets from an infected person's cough or sneeze. Touching something that has been exposed to the virus (is contaminated) and then touching your mouth, nose, or eyes. What increases the risk? You are more likely to get a URI if: You are very young or very old. You have close contact with others, such as at work, school, or a health care facility. You smoke. You have long-term (chronic) heart or lung disease. You have a weakened disease-fighting system (immune system). You have nasal allergies or asthma. You are experiencing a lot of stress. You have poor nutrition. What are the signs or symptoms? A URI usually involves some of the following symptoms: Runny or stuffy (congested) nose. Cough. Sneezing. Sore throat. Headache. Fatigue. Fever. Loss of appetite. Pain in your forehead, behind your eyes, and over your cheekbones (sinus pain). Muscle aches. Redness or irritation of the eyes. Pressure in the ears or face. How is this diagnosed? This condition may be diagnosed based on your medical history and symptoms, and a physical exam. Your health care provider may use a swab to take a mucus sample from your nose (nasal swab). This sample can be tested to determine what virus is causing the illness. How is this treated? URIs usually get better on  their own within 7-10 days. Medicines cannot cure URIs, but your health care provider may recommend certain medicines to help relieve symptoms, such as: Over-the-counter cold medicines. Cough suppressants. Coughing is a type of defense against infection that helps to clear the respiratory system, so take these medicines only as recommended by your health care provider. Fever-reducing medicines. Follow these instructions at home: Activity Rest as needed. If you have a fever, stay home from work or school until your fever is gone or until your health care provider says your URI cannot spread to other people (is no longer contagious). Your health care provider may have you wear a face mask to prevent your infection from spreading. Relieving symptoms Gargle with a mixture of salt and water 3-4 times a day or as needed. To make salt water, completely dissolve -1 tsp (3-6 g) of salt in 1 cup (237 mL) of warm water. Use a cool-mist humidifier to add moisture to the air. This can help you breathe more easily. Eating and drinking  Drink enough fluid to keep your urine pale yellow. Eat soups and other clear broths. General instructions  Take over-the-counter and prescription medicines only as told by your health care provider. These include cold medicines, fever reducers, and cough suppressants. Do not use any products that contain nicotine or tobacco. These products include cigarettes, chewing tobacco, and vaping devices, such as e-cigarettes. If you need help quitting, ask your health care provider. Stay away from secondhand smoke. Stay up to date on all immunizations, including the yearly (annual) flu  vaccine. Keep all follow-up visits. This is important. How to prevent the spread of infection to others URIs can be contagious. To prevent the infection from spreading: Wash your hands with soap and water for at least 20 seconds. If soap and water are not available, use hand sanitizer. Avoid touching  your mouth, face, eyes, or nose. Cough or sneeze into a tissue or your sleeve or elbow instead of into your hand or into the air.  Contact a health care provider if: You are getting worse instead of better. You have a fever or chills. Your mucus is brown or red. You have yellow or brown discharge coming from your nose. You have pain in your face, especially when you bend forward. You have swollen neck glands. You have pain while swallowing. You have white areas in the back of your throat. Get help right away if: You have shortness of breath that gets worse. You have severe or persistent: Headache. Ear pain. Sinus pain. Chest pain. You have chronic lung disease along with any of the following: Making high-pitched whistling sounds when you breathe, most often when you breathe out (wheezing). Prolonged cough (more than 14 days). Coughing up blood. A change in your usual mucus. You have a stiff neck. You have changes in your: Vision. Hearing. Thinking. Mood. These symptoms may be an emergency. Get help right away. Call 911. Do not wait to see if the symptoms will go away. Do not drive yourself to the hospital. Summary An upper respiratory infection (URI) is a common infection of the nose, throat, and upper air passages that lead to the lungs. A URI is caused by a virus. URIs usually get better on their own within 7-10 days. Medicines cannot cure URIs, but your health care provider may recommend certain medicines to help relieve symptoms. This information is not intended to replace advice given to you by your health care provider. Make sure you discuss any questions you have with your health care provider. Document Revised: 11/29/2020 Document Reviewed: 11/29/2020 Elsevier Patient Education  2024 [employer]. "

## 2024-05-03 NOTE — Progress Notes (Signed)
 "  [name], DNP, AGNP-c [facility] [address] [place], [place] [address] [phone]   ACUTE VISIT : Acute or New Concern Visit on 05/03/2024  Blood pressure 118/74, pulse 76, temperature 98.9 F (37.2 C), weight 169 lb 3.2 oz (76.7 kg), SpO2 100%.   Subjective:  other (Cough started Sat. Felt bad since last Monday, ST, no fever, body aches in back area, did a e-visit on 04/29/24 they thought she had flu because her kids did and they gave her Tamiflu  but she did not take it because her flu test was negative )   History of Present Illness Lydia [name] is a 38 year old female who presents with a persistent cough and sore throat.  She began feeling unwell approximately one week ago, coinciding with her two youngest children contracting the flu. Despite her suspicion of having the flu, her at-home flu tests were negative on two occasions, including the day her children were tested and the following Friday.  Her symptoms started with a sore throat and fever, accompanied by body aches localized to her back. The cough began this past Saturday and exacerbates her throat pain. The cough is persistent but does not disrupt her sleep.  Her children have since recovered, but she continues to experience symptoms. Her husband is also sick, though his flu test was negative. She received Tamiflu , but she continues to feel unwell.  In terms of family history, her husband's sister's family also fell ill, with some members testing positive for the flu while others did not. She received a flu shot this year and observes that those who had the flu shot seem to have negative test results despite flu-like symptoms.  She is currently not using any inhalers, although her children have in the past. She has not paid attention to whether she develops yeast infections when taking antibiotics. ROS negative except for what is listed in HPI. History, Medications, Surgery, SDOH, and Family  History reviewed and updated as appropriate.  Objective:  Physical Exam Vitals and nursing note reviewed.  Constitutional:      Appearance: She is ill-appearing.  HENT:     Head: Normocephalic.     Ears:     Comments: Clear effusion bilaterally.     Nose: Congestion and rhinorrhea present.     Mouth/Throat:     Mouth: Mucous membranes are moist.     Pharynx: Posterior oropharyngeal erythema present.  Eyes:     Pupils: Pupils are equal, round, and reactive to light.  Cardiovascular:     Rate and Rhythm: Normal rate and regular rhythm.     Pulses: Normal pulses.     Heart sounds: Normal heart sounds.  Pulmonary:     Effort: Pulmonary effort is normal.     Breath sounds: Normal breath sounds.  Musculoskeletal:        General: Normal range of motion.     Cervical back: Normal range of motion.  Lymphadenopathy:     Cervical: Cervical adenopathy present.  Skin:    General: Skin is warm.  Neurological:     General: No focal deficit present.     Mental Status: She is alert and oriented to person, place, and time.  Psychiatric:        Mood and Affect: Mood normal.         Assessment & Plan:   Assessment & Plan Bacterial URI Sore throat Cough, unspecified type Cough, sore throat, and body aches persisting for a week. Initial  suspicion of viral etiology, but prolonged symptoms suggest bacterial involvement. Negative flu and COVID tests. Symptoms include cough and sore throat, with no significant sleep disturbance. Lungs sound clear, but cough persists. Differential includes atypical bacterial infection. - Prescribed azithromycin : 2 tablets on day 1, then 1 tablet daily for the next 4 days. - Prescribed prednisone  to help with lung inflammation and energy. - Prescribed Hycodan for cough and throat pain, with caution for potential drowsiness. - Prescribed albuterol  inhaler for use in the morning and before bed to open airways. - Advised deep breathing exercises to keep alveoli  open. - Encouraged hydration and rest.   Orders:   POCT rapid strep A   Influenza A/B   POC COVID-19   azithromycin  (ZITHROMAX ) 250 MG tablet; Take 2 tablets (500 mg total) by mouth daily for 1 day, THEN 1 tablet (250 mg total) daily for 4 days.   predniSONE  (DELTASONE ) 20 MG tablet; Take 1 tablet (20 mg total) by mouth daily with breakfast for 6 days.   HYDROcodone  bit-homatropine (HYCODAN) 5-1.5 MG/5ML syrup; Take 5 mLs by mouth every 8 (eight) hours as needed for cough.   albuterol  (VENTOLIN  HFA) 108 (90 Base) MCG/ACT inhaler; Inhale 1-2 puffs into the lungs every 6 (six) hours as needed for wheezing or shortness of breath or cough.  Yeast infection Potential for yeast infection due to antibiotic use.  - Prescribed Diflucan  as a precautionary measure against yeast infection. Orders:   fluconazole  (DIFLUCAN ) 150 MG tablet; Take 1 tablet (150 mg total) by mouth once for 1 dose.    Schon Zeiders E Yarithza Mink, DNP, AGNP-c       "

## 2024-05-14 ENCOUNTER — Encounter: Payer: Self-pay | Admitting: Nurse Practitioner

## 2024-05-14 ENCOUNTER — Ambulatory Visit: Admitting: Podiatry

## 2024-05-17 ENCOUNTER — Encounter: Payer: Self-pay | Admitting: Nurse Practitioner

## 2024-05-18 ENCOUNTER — Encounter: Payer: Self-pay | Admitting: Nurse Practitioner

## 2024-05-18 ENCOUNTER — Other Ambulatory Visit (HOSPITAL_COMMUNITY): Payer: Self-pay

## 2024-05-18 ENCOUNTER — Ambulatory Visit: Admitting: Nurse Practitioner

## 2024-05-18 VITALS — BP 116/72 | HR 78 | Wt 168.2 lb

## 2024-05-18 DIAGNOSIS — J069 Acute upper respiratory infection, unspecified: Secondary | ICD-10-CM | POA: Diagnosis not present

## 2024-05-18 DIAGNOSIS — T148XXA Other injury of unspecified body region, initial encounter: Secondary | ICD-10-CM | POA: Diagnosis not present

## 2024-05-18 DIAGNOSIS — B9689 Other specified bacterial agents as the cause of diseases classified elsewhere: Secondary | ICD-10-CM | POA: Diagnosis not present

## 2024-05-18 MED ORDER — HYDROCODONE BIT-HOMATROP MBR 5-1.5 MG/5ML PO SOLN
5.0000 mL | Freq: Three times a day (TID) | ORAL | 0 refills | Status: AC | PRN
  Filled 2024-05-18: qty 120, 8d supply, fill #0

## 2024-05-18 MED ORDER — DOXYCYCLINE HYCLATE 100 MG PO TABS
100.0000 mg | ORAL_TABLET | Freq: Two times a day (BID) | ORAL | 0 refills | Status: DC
  Filled 2024-05-18: qty 10, 5d supply, fill #0

## 2024-05-18 NOTE — Progress Notes (Signed)
 "  [name], DNP, AGNP-c [facility] [address] [place], [place] [address] [phone]   ACUTE VISIT : Acute or New Concern Visit on 05/18/2024  Blood pressure 116/72, pulse 78, weight 168 lb 3.2 oz (76.3 kg).   Subjective:  other (Bruising, still hurts on lt. Side of neck bruise went away though, no injury to that area, )  History of Present Illness Shuntavia A Manetta is a 39 year old female who presents with left-sided neck pain and bruising.  She has been experiencing left-sided neck pain with tenderness to touch, rated at 2 to 3 out of 10 when pressure is applied. There is no recollection of any injury to the area. The bruise that was present has resolved, and she denies any tendency to bruise easily elsewhere on her body. She does not take aspirin or other medications that might contribute to bruising. The bruising was first noticed on New Years Eve by her sister in social worker.   She has been experiencing frequent coughing, particularly at night, which has been persistent since URI two weeks ago. She notes that the cough medicine she previously used was helpful. She denies any recent fevers or chills but reports experiencing body aches and heavy legs the previous day.   ROS negative except for what is listed in HPI. History, Medications, Surgery, SDOH, and Family History reviewed and updated as appropriate.  Objective:  Physical Exam Vitals and nursing note reviewed.  Constitutional:      General: She is not in acute distress.    Appearance: Normal appearance. She is not ill-appearing.  HENT:     Head: Normocephalic.     Nose: Nose normal.     Mouth/Throat:     Mouth: Mucous membranes are moist.     Comments: Post nasal drip Eyes:     General: No scleral icterus.    Pupils: Pupils are equal, round, and reactive to light.  Neck:     Vascular: No carotid bruit.  Cardiovascular:     Rate and Rhythm: Normal rate and regular rhythm.     Pulses: Normal pulses.      Heart sounds: Normal heart sounds.  Pulmonary:     Effort: Pulmonary effort is normal.     Breath sounds: Rhonchi present.  Musculoskeletal:     Cervical back: Normal range of motion and neck supple. Tenderness present.     Right lower leg: No edema.     Left lower leg: No edema.  Lymphadenopathy:     Cervical: Cervical adenopathy present.  Skin:    General: Skin is warm and dry.     Capillary Refill: Capillary refill takes less than 2 seconds.  Neurological:     General: No focal deficit present.     Mental Status: She is alert and oriented to person, place, and time.     Sensory: No sensory deficit.     Motor: No weakness.     Coordination: Coordination normal.  Psychiatric:        Mood and Affect: Mood normal.        Behavior: Behavior normal.         Assessment & Plan:   Assessment & Plan Bruising Bruising on the left side of the neck, now resolved, with residual soreness. No recollection of injury. Likely due to pressure from coughing causing small blood vessels to burst. Differential includes vitamin deficiencies (B12 or vitamin D ) or clotting disorders, though no current evidence of these. Enlarged lymph nodes noted,  which can be normal post-infection. - Checked clotting times and vitamin levels to rule out underlying causes. - Advised to report any new bruising or worsening pain. - If labs are normal, consider the increased pressure from coughing as source.  Orders:   Comprehensive metabolic panel with GFR   VITAMIN D  25 Hydroxy (Vit-D Deficiency, Fractures)   Protime-INR   Vitamin B12   APTT   CBC with Differential/Platelet  Bacterial URI Persistent cough and fatigue, particularly at night, with possible bacterial superinfection following a viral infection. Symptoms include body aches and heavy legs. No fever or chills reported. Cough medicine has been somewhat effective. - Prescribed doxycycline  for potential bacterial infection. - Continue cough syrup to  manage symptoms. - Advised rest and monitor for worsening symptoms. Orders:   HYDROcodone  bit-homatropine (HYCODAN) 5-1.5 MG/5ML syrup; Take 5 mLs by mouth every 8 (eight) hours as needed for cough.   doxycycline  (VIBRA -TABS) 100 MG tablet; Take 1 tablet (100 mg total) by mouth 2 (two) times daily.    Delrose Rohwer E Lydia Krinke, DNP, AGNP-c       "

## 2024-05-18 NOTE — Patient Instructions (Signed)
 I suspect the coughing caused a small blood vessel in the neck to burst creating the bruise. This can certainly happen. We have checked today to make sure that your blood clotting is looking ok and there are no concerns. As long as the labs are good, this should resolve. We will try to keep the coughing down so it doesn't come back.

## 2024-05-19 ENCOUNTER — Ambulatory Visit: Payer: Self-pay | Admitting: Nurse Practitioner

## 2024-05-19 LAB — PROTIME-INR
INR: 0.9 (ref 0.9–1.2)
Prothrombin Time: 10.1 s (ref 9.1–12.0)

## 2024-05-19 LAB — APTT: aPTT: 26 s (ref 24–33)

## 2024-05-19 LAB — COMPREHENSIVE METABOLIC PANEL WITH GFR
ALT: 9 IU/L (ref 0–32)
AST: 14 IU/L (ref 0–40)
Albumin: 4.2 g/dL (ref 3.9–4.9)
Alkaline Phosphatase: 54 IU/L (ref 41–116)
BUN/Creatinine Ratio: 19 (ref 9–23)
BUN: 11 mg/dL (ref 6–20)
Bilirubin Total: 0.4 mg/dL (ref 0.0–1.2)
CO2: 22 mmol/L (ref 20–29)
Calcium: 9.6 mg/dL (ref 8.7–10.2)
Chloride: 104 mmol/L (ref 96–106)
Creatinine, Ser: 0.59 mg/dL (ref 0.57–1.00)
Globulin, Total: 2.3 g/dL (ref 1.5–4.5)
Glucose: 82 mg/dL (ref 70–99)
Potassium: 4.7 mmol/L (ref 3.5–5.2)
Sodium: 142 mmol/L (ref 134–144)
Total Protein: 6.5 g/dL (ref 6.0–8.5)
eGFR: 119 mL/min/1.73

## 2024-05-19 LAB — VITAMIN D 25 HYDROXY (VIT D DEFICIENCY, FRACTURES): Vit D, 25-Hydroxy: 23.7 ng/mL — AB (ref 30.0–100.0)

## 2024-05-19 LAB — CBC WITH DIFFERENTIAL/PLATELET
Basophils Absolute: 0 x10E3/uL (ref 0.0–0.2)
Basos: 0 %
EOS (ABSOLUTE): 0.2 x10E3/uL (ref 0.0–0.4)
Eos: 2 %
Hematocrit: 39.8 % (ref 34.0–46.6)
Hemoglobin: 12.8 g/dL (ref 11.1–15.9)
Immature Grans (Abs): 0 x10E3/uL (ref 0.0–0.1)
Immature Granulocytes: 0 %
Lymphocytes Absolute: 3.3 x10E3/uL — ABNORMAL HIGH (ref 0.7–3.1)
Lymphs: 34 %
MCH: 28.9 pg (ref 26.6–33.0)
MCHC: 32.2 g/dL (ref 31.5–35.7)
MCV: 90 fL (ref 79–97)
Monocytes Absolute: 0.4 x10E3/uL (ref 0.1–0.9)
Monocytes: 5 %
Neutrophils Absolute: 5.7 x10E3/uL (ref 1.4–7.0)
Neutrophils: 59 %
Platelets: 459 x10E3/uL — ABNORMAL HIGH (ref 150–450)
RBC: 4.43 x10E6/uL (ref 3.77–5.28)
RDW: 12.4 % (ref 11.7–15.4)
WBC: 9.7 x10E3/uL (ref 3.4–10.8)

## 2024-05-19 LAB — VITAMIN B12: Vitamin B-12: 679 pg/mL (ref 232–1245)

## 2024-05-20 ENCOUNTER — Other Ambulatory Visit (HOSPITAL_COMMUNITY): Payer: Self-pay

## 2024-05-24 ENCOUNTER — Encounter: Payer: Self-pay | Admitting: *Deleted

## 2024-05-31 ENCOUNTER — Ambulatory Visit: Admitting: Physician Assistant

## 2024-06-03 ENCOUNTER — Other Ambulatory Visit: Payer: Self-pay | Admitting: *Deleted

## 2024-06-03 MED ORDER — PRUCALOPRIDE SUCCINATE 2 MG PO TABS: 2.0000 mg | ORAL_TABLET | Freq: Every day | ORAL | 1 refills | Status: AC

## 2024-06-04 ENCOUNTER — Telehealth

## 2024-06-04 DIAGNOSIS — B3731 Acute candidiasis of vulva and vagina: Secondary | ICD-10-CM | POA: Diagnosis not present

## 2024-06-05 MED ORDER — FLUCONAZOLE 150 MG PO TABS: 150.0000 mg | ORAL_TABLET | ORAL | 0 refills | Status: DC | PRN

## 2024-06-05 NOTE — Progress Notes (Signed)

## 2024-06-07 ENCOUNTER — Other Ambulatory Visit (HOSPITAL_COMMUNITY): Payer: Self-pay

## 2024-06-07 ENCOUNTER — Telehealth: Payer: Self-pay

## 2024-06-07 ENCOUNTER — Ambulatory Visit: Admitting: Nurse Practitioner

## 2024-06-07 ENCOUNTER — Ambulatory Visit: Admitting: Podiatry

## 2024-06-07 NOTE — Telephone Encounter (Signed)
 Pharmacy Patient Advocate Encounter   Received notification from Patient Advice Request messages that prior authorization for Prucalopride Succinate  2MG  tablets is required/requested.   Insurance verification completed.   The patient is insured through [facility] MEDICAID.   Per test claim: PA required; PA submitted to above mentioned insurance via Latent Key/confirmation #/EOC [id number] Status is pending   *Patient will require office visit as it has been one year

## 2024-06-07 NOTE — Telephone Encounter (Signed)
 PA request has been Submitted. New Encounter has been or will be created for follow up. For additional info see Pharmacy Prior Auth telephone encounter from 06-07-2024.

## 2024-06-10 ENCOUNTER — Other Ambulatory Visit (HOSPITAL_COMMUNITY): Payer: Self-pay

## 2024-06-10 NOTE — Telephone Encounter (Signed)
 Pharmacy Patient Advocate Encounter  Received notification from [employer] that Prior Authorization for Prucalopride Succinate  2MG  tablets has been APPROVED from 06-07-2024 to 06-07-2025. Ran test claim, Copay is $4.00. This test claim was processed through [facility]- copay amounts may vary at other pharmacies due to pharmacy/plan contracts, or as the patient moves through the different stages of their insurance plan.   PA #/Case ID/Reference #: [id number]

## 2024-06-14 ENCOUNTER — Ambulatory Visit: Admitting: Podiatry

## 2024-06-15 ENCOUNTER — Encounter: Payer: Self-pay | Admitting: Nurse Practitioner

## 2024-06-16 NOTE — Progress Notes (Unsigned)
{  SEHM (Optional):34217}  Lydia Doing, DNP, AGNP-c [facility]  [address] [place], [place] [address] [phone]   ESTABLISHED PATIENT- Chronic Health and/or Follow-Up Visit on 06/17/2024  There were no vitals taken for this visit.   Subjective:  No chief complaint on file.  ***  ROS negative except for what is listed in HPI. History, Medications, Surgery, SDOH, and Family History reviewed and updated as appropriate.  Objective:  Physical Exam      Assessment & Plan:   Assessment & Plan Mixed hyperlipidemia  Orders:   CMP14+EGFR   Lipid panel   CBC with Differential/Platelet  Pleurisy  Orders:   predniSONE  (DELTASONE ) 20 MG tablet; Take 3 tablets (60mg ) by mouth once daily x 2 days, then 2 tabs (40mg ) daily x 2 days, then 1 tab (20mg ) daily x 2 days, then 1/2 tab (10mg ) for 4 days.  Acute non-recurrent pansinusitis  Orders:   azithromycin  (ZITHROMAX ) 250 MG tablet; Take 2 tablets (500 mg total) by mouth daily for 1 day, THEN 1 tablet (250 mg total) daily for 4 days.  Vaginal candida  Orders:   fluconazole  (DIFLUCAN ) 150 MG tablet; Take 1 tablet (150 mg total) by mouth every 3 (three) days for 4 doses.  Cystitis  Orders:   nitrofurantoin , macrocrystal-monohydrate, (MACROBID ) 100 MG capsule; Take 1 capsule (100 mg total) by mouth every 12 hours for 5 days for UTI.    Lydia FORBES Doing, DNP, AGNP-c  {SETIMEYorN (Optional):34216}

## 2024-06-17 ENCOUNTER — Ambulatory Visit: Admitting: Nurse Practitioner

## 2024-06-17 ENCOUNTER — Other Ambulatory Visit (HOSPITAL_COMMUNITY): Payer: Self-pay

## 2024-06-17 ENCOUNTER — Encounter: Payer: Self-pay | Admitting: Nurse Practitioner

## 2024-06-17 VITALS — BP 118/82 | HR 72 | Wt 170.4 lb

## 2024-06-17 DIAGNOSIS — J0140 Acute pansinusitis, unspecified: Secondary | ICD-10-CM

## 2024-06-17 DIAGNOSIS — B3731 Acute candidiasis of vulva and vagina: Secondary | ICD-10-CM

## 2024-06-17 DIAGNOSIS — E782 Mixed hyperlipidemia: Secondary | ICD-10-CM

## 2024-06-17 DIAGNOSIS — R091 Pleurisy: Secondary | ICD-10-CM

## 2024-06-17 DIAGNOSIS — N3090 Cystitis, unspecified without hematuria: Secondary | ICD-10-CM

## 2024-06-17 LAB — LIPID PANEL

## 2024-06-17 MED ORDER — AZITHROMYCIN 250 MG PO TABS
ORAL_TABLET | ORAL | 0 refills | Status: AC
  Filled 2024-06-17: qty 6, 5d supply, fill #0

## 2024-06-17 MED ORDER — PREDNISONE 20 MG PO TABS
ORAL_TABLET | ORAL | 0 refills | Status: AC
  Filled 2024-06-17: qty 14, 10d supply, fill #0

## 2024-06-17 MED ORDER — FLUCONAZOLE 150 MG PO TABS
150.0000 mg | ORAL_TABLET | ORAL | 2 refills | Status: AC
  Filled 2024-06-17: qty 4, 12d supply, fill #0

## 2024-06-17 MED ORDER — NITROFURANTOIN MONOHYD MACRO 100 MG PO CAPS
100.0000 mg | ORAL_CAPSULE | Freq: Two times a day (BID) | ORAL | 0 refills | Status: AC
  Filled 2024-06-17: qty 10, 5d supply, fill #0

## 2024-06-17 NOTE — Assessment & Plan Note (Signed)
" °  Orders:   CMP14+EGFR   Lipid panel   CBC with Differential/Platelet  "

## 2024-06-17 NOTE — Patient Instructions (Addendum)
 I have sent in a steroid taper pack for the pleurisy. This will help with the inflammation in the lining of the lungs and help with the pain you have with sneezing and coughing. You can still do warm baths and the heating pad, this can provide some relief. Avoid Ibuprofen  while on the steroid to reduce the risk of stomach upset.    Start the nitrofurantoin  today for UTI. Also start the fluconazole  for yeast today.  You can start the azithromycin  if your sinus symptoms start to get worse, you have green or yellow mucus or you start to feel fevered.

## 2024-06-18 ENCOUNTER — Encounter: Payer: Self-pay | Admitting: Nurse Practitioner

## 2024-06-18 LAB — LIPID PANEL
Cholesterol, Total: 200 mg/dL — AB (ref 100–199)
HDL: 55 mg/dL
LDL CALC COMMENT:: 3.6 ratio (ref 0.0–4.4)
LDL Chol Calc (NIH): 110 mg/dL — AB (ref 0–99)
Triglycerides: 204 mg/dL — AB (ref 0–149)
VLDL Cholesterol Cal: 35 mg/dL (ref 5–40)

## 2024-06-18 LAB — CBC WITH DIFFERENTIAL/PLATELET
Basophils Absolute: 0.1 10*3/uL (ref 0.0–0.2)
Basos: 1 %
EOS (ABSOLUTE): 0.3 10*3/uL (ref 0.0–0.4)
Eos: 3 %
Hematocrit: 38.5 % (ref 34.0–46.6)
Hemoglobin: 13.1 g/dL (ref 11.1–15.9)
Immature Grans (Abs): 0 10*3/uL (ref 0.0–0.1)
Immature Granulocytes: 0 %
Lymphocytes Absolute: 3.7 10*3/uL — ABNORMAL HIGH (ref 0.7–3.1)
Lymphs: 35 %
MCH: 30.5 pg (ref 26.6–33.0)
MCHC: 34 g/dL (ref 31.5–35.7)
MCV: 90 fL (ref 79–97)
Monocytes Absolute: 0.4 10*3/uL (ref 0.1–0.9)
Monocytes: 3 %
Neutrophils Absolute: 6.2 10*3/uL (ref 1.4–7.0)
Neutrophils: 58 %
Platelets: 420 10*3/uL (ref 150–450)
RBC: 4.29 x10E6/uL (ref 3.77–5.28)
RDW: 12 % (ref 11.7–15.4)
WBC: 10.7 10*3/uL (ref 3.4–10.8)

## 2024-06-18 LAB — CMP14+EGFR
ALT: 10 [IU]/L (ref 0–32)
AST: 18 [IU]/L (ref 0–40)
Albumin: 4.3 g/dL (ref 3.9–4.9)
Alkaline Phosphatase: 52 [IU]/L (ref 41–116)
BUN/Creatinine Ratio: 17 (ref 9–23)
BUN: 10 mg/dL (ref 6–20)
Bilirubin Total: 0.3 mg/dL (ref 0.0–1.2)
CO2: 22 mmol/L (ref 20–29)
Calcium: 9.6 mg/dL (ref 8.7–10.2)
Chloride: 101 mmol/L (ref 96–106)
Creatinine, Ser: 0.59 mg/dL (ref 0.57–1.00)
Globulin, Total: 2.2 g/dL (ref 1.5–4.5)
Glucose: 98 mg/dL (ref 70–99)
Potassium: 4.3 mmol/L (ref 3.5–5.2)
Sodium: 139 mmol/L (ref 134–144)
Total Protein: 6.5 g/dL (ref 6.0–8.5)
eGFR: 119 mL/min/{1.73_m2}

## 2024-06-28 ENCOUNTER — Ambulatory Visit: Admitting: Nurse Practitioner

## 2024-07-16 ENCOUNTER — Ambulatory Visit: Admitting: Nurse Practitioner

## 2024-07-22 ENCOUNTER — Telehealth: Admitting: Adult Health

## 2024-08-12 ENCOUNTER — Ambulatory Visit: Payer: Self-pay | Admitting: Nurse Practitioner

## 2025-02-22 ENCOUNTER — Encounter: Payer: Self-pay | Admitting: Nurse Practitioner
